# Patient Record
Sex: Male | Born: 1935 | Race: White | Hispanic: No | Marital: Married | Smoking: Never smoker
Health system: Southern US, Community
[De-identification: ages and names within clinical notes are randomized; demographics above are authoritative.]

## PROBLEM LIST (undated history)

## (undated) DIAGNOSIS — F0390 Unspecified dementia without behavioral disturbance: Secondary | ICD-10-CM

## (undated) DIAGNOSIS — M1990 Unspecified osteoarthritis, unspecified site: Secondary | ICD-10-CM

## (undated) DIAGNOSIS — Z960 Presence of urogenital implants: Secondary | ICD-10-CM

## (undated) DIAGNOSIS — N183 Chronic kidney disease, stage 3 unspecified: Secondary | ICD-10-CM

## (undated) DIAGNOSIS — E119 Type 2 diabetes mellitus without complications: Secondary | ICD-10-CM

## (undated) DIAGNOSIS — I701 Atherosclerosis of renal artery: Secondary | ICD-10-CM

## (undated) DIAGNOSIS — I2510 Atherosclerotic heart disease of native coronary artery without angina pectoris: Secondary | ICD-10-CM

## (undated) DIAGNOSIS — R339 Retention of urine, unspecified: Secondary | ICD-10-CM

## (undated) DIAGNOSIS — I255 Ischemic cardiomyopathy: Secondary | ICD-10-CM

## (undated) DIAGNOSIS — Z993 Dependence on wheelchair: Secondary | ICD-10-CM

## (undated) DIAGNOSIS — I4821 Permanent atrial fibrillation: Secondary | ICD-10-CM

## (undated) DIAGNOSIS — Z951 Presence of aortocoronary bypass graft: Secondary | ICD-10-CM

## (undated) DIAGNOSIS — Z978 Presence of other specified devices: Secondary | ICD-10-CM

## (undated) DIAGNOSIS — Z950 Presence of cardiac pacemaker: Secondary | ICD-10-CM

## (undated) DIAGNOSIS — E785 Hyperlipidemia, unspecified: Secondary | ICD-10-CM

## (undated) DIAGNOSIS — I442 Atrioventricular block, complete: Secondary | ICD-10-CM

## (undated) DIAGNOSIS — I5022 Chronic systolic (congestive) heart failure: Secondary | ICD-10-CM

## (undated) DIAGNOSIS — I10 Essential (primary) hypertension: Secondary | ICD-10-CM

## (undated) DIAGNOSIS — Z741 Need for assistance with personal care: Secondary | ICD-10-CM

## (undated) HISTORY — DX: Hyperlipidemia, unspecified: E78.5

## (undated) HISTORY — PX: OTHER SURGICAL HISTORY: SHX169

## (undated) HISTORY — PX: CARDIOVASCULAR STRESS TEST: SHX262

## (undated) HISTORY — DX: Essential (primary) hypertension: I10

## (undated) HISTORY — DX: Atrioventricular block, complete: I44.2

## (undated) HISTORY — PX: CORONARY ANGIOPLASTY WITH STENT PLACEMENT: SHX49

## (undated) HISTORY — DX: Atherosclerosis of renal artery: I70.1

## (undated) HISTORY — DX: Atherosclerotic heart disease of native coronary artery without angina pectoris: I25.10

## (undated) HISTORY — PX: PACEMAKER INSERTION: SHX728

## (undated) HISTORY — PX: PACEMAKER GENERATOR CHANGE: SHX5998

## (undated) HISTORY — PX: CORONARY ARTERY BYPASS GRAFT: SHX141

## (undated) HISTORY — DX: Unspecified osteoarthritis, unspecified site: M19.90

---

## 1996-08-10 DIAGNOSIS — Z951 Presence of aortocoronary bypass graft: Secondary | ICD-10-CM

## 1996-08-10 HISTORY — DX: Presence of aortocoronary bypass graft: Z95.1

## 2001-02-28 ENCOUNTER — Encounter: Payer: Self-pay | Admitting: Emergency Medicine

## 2001-02-28 ENCOUNTER — Emergency Department (HOSPITAL_COMMUNITY): Admission: EM | Admit: 2001-02-28 | Discharge: 2001-02-28 | Payer: Self-pay | Admitting: Emergency Medicine

## 2001-12-09 ENCOUNTER — Emergency Department (HOSPITAL_COMMUNITY): Admission: EM | Admit: 2001-12-09 | Discharge: 2001-12-09 | Payer: Self-pay | Admitting: Emergency Medicine

## 2001-12-16 ENCOUNTER — Emergency Department (HOSPITAL_COMMUNITY): Admission: EM | Admit: 2001-12-16 | Discharge: 2001-12-16 | Payer: Self-pay | Admitting: Emergency Medicine

## 2002-03-31 ENCOUNTER — Ambulatory Visit (HOSPITAL_COMMUNITY): Admission: RE | Admit: 2002-03-31 | Discharge: 2002-03-31 | Payer: Self-pay | Admitting: Podiatry

## 2002-03-31 ENCOUNTER — Encounter: Payer: Self-pay | Admitting: Podiatry

## 2004-05-18 ENCOUNTER — Ambulatory Visit: Payer: Self-pay | Admitting: Internal Medicine

## 2004-07-01 ENCOUNTER — Ambulatory Visit: Payer: Self-pay

## 2004-07-30 ENCOUNTER — Ambulatory Visit: Payer: Self-pay | Admitting: Internal Medicine

## 2004-08-30 ENCOUNTER — Ambulatory Visit: Payer: Self-pay | Admitting: Internal Medicine

## 2004-09-09 ENCOUNTER — Ambulatory Visit (HOSPITAL_COMMUNITY): Admission: RE | Admit: 2004-09-09 | Discharge: 2004-09-09 | Payer: Self-pay | Admitting: Family Medicine

## 2004-09-26 ENCOUNTER — Emergency Department (HOSPITAL_COMMUNITY): Admission: EM | Admit: 2004-09-26 | Discharge: 2004-09-26 | Payer: Self-pay | Admitting: Emergency Medicine

## 2004-09-27 ENCOUNTER — Ambulatory Visit: Payer: Self-pay | Admitting: Internal Medicine

## 2004-10-11 ENCOUNTER — Ambulatory Visit (HOSPITAL_COMMUNITY): Admission: RE | Admit: 2004-10-11 | Discharge: 2004-10-11 | Payer: Self-pay | Admitting: Family Medicine

## 2004-10-27 ENCOUNTER — Ambulatory Visit: Payer: Self-pay | Admitting: Internal Medicine

## 2004-12-08 ENCOUNTER — Ambulatory Visit: Payer: Self-pay | Admitting: Internal Medicine

## 2005-01-11 ENCOUNTER — Ambulatory Visit: Payer: Self-pay | Admitting: Internal Medicine

## 2005-02-13 ENCOUNTER — Ambulatory Visit: Payer: Self-pay | Admitting: Internal Medicine

## 2005-03-22 ENCOUNTER — Ambulatory Visit: Payer: Self-pay | Admitting: Internal Medicine

## 2005-05-01 ENCOUNTER — Ambulatory Visit: Payer: Self-pay

## 2005-06-02 ENCOUNTER — Ambulatory Visit: Payer: Self-pay | Admitting: Internal Medicine

## 2005-07-05 ENCOUNTER — Ambulatory Visit: Payer: Self-pay | Admitting: Internal Medicine

## 2005-08-07 ENCOUNTER — Ambulatory Visit: Payer: Self-pay | Admitting: Internal Medicine

## 2005-09-08 ENCOUNTER — Ambulatory Visit: Payer: Self-pay | Admitting: Internal Medicine

## 2005-10-11 ENCOUNTER — Ambulatory Visit: Payer: Self-pay | Admitting: Internal Medicine

## 2005-11-21 ENCOUNTER — Ambulatory Visit: Payer: Self-pay | Admitting: Internal Medicine

## 2005-12-25 ENCOUNTER — Ambulatory Visit: Payer: Self-pay | Admitting: Internal Medicine

## 2006-01-30 ENCOUNTER — Ambulatory Visit: Payer: Self-pay | Admitting: Internal Medicine

## 2006-02-26 ENCOUNTER — Ambulatory Visit: Payer: Self-pay | Admitting: Internal Medicine

## 2006-03-26 ENCOUNTER — Ambulatory Visit: Payer: Self-pay | Admitting: Internal Medicine

## 2006-04-09 ENCOUNTER — Ambulatory Visit: Payer: Self-pay | Admitting: *Deleted

## 2006-04-19 ENCOUNTER — Ambulatory Visit: Payer: Self-pay

## 2006-04-19 ENCOUNTER — Encounter: Payer: Self-pay | Admitting: Cardiology

## 2006-04-23 ENCOUNTER — Ambulatory Visit: Payer: Self-pay

## 2006-05-23 ENCOUNTER — Ambulatory Visit: Payer: Self-pay | Admitting: Internal Medicine

## 2006-06-20 ENCOUNTER — Ambulatory Visit: Payer: Self-pay | Admitting: Internal Medicine

## 2006-08-15 ENCOUNTER — Ambulatory Visit: Payer: Self-pay | Admitting: Internal Medicine

## 2006-09-12 ENCOUNTER — Ambulatory Visit: Payer: Self-pay | Admitting: Internal Medicine

## 2006-10-04 ENCOUNTER — Ambulatory Visit: Payer: Self-pay | Admitting: *Deleted

## 2006-10-11 ENCOUNTER — Ambulatory Visit: Payer: Self-pay | Admitting: Internal Medicine

## 2006-10-29 ENCOUNTER — Ambulatory Visit: Payer: Self-pay

## 2006-10-29 ENCOUNTER — Encounter: Payer: Self-pay | Admitting: Cardiovascular Disease

## 2006-11-07 ENCOUNTER — Ambulatory Visit: Payer: Self-pay | Admitting: Internal Medicine

## 2006-12-05 ENCOUNTER — Ambulatory Visit: Payer: Self-pay | Admitting: Internal Medicine

## 2007-01-02 ENCOUNTER — Ambulatory Visit: Payer: Self-pay | Admitting: Internal Medicine

## 2007-01-23 ENCOUNTER — Ambulatory Visit: Payer: Self-pay | Admitting: Internal Medicine

## 2007-02-27 ENCOUNTER — Ambulatory Visit: Payer: Self-pay | Admitting: Internal Medicine

## 2007-03-27 ENCOUNTER — Ambulatory Visit: Payer: Self-pay | Admitting: Internal Medicine

## 2007-04-24 ENCOUNTER — Ambulatory Visit: Payer: Self-pay | Admitting: Internal Medicine

## 2007-06-19 ENCOUNTER — Ambulatory Visit: Payer: Self-pay | Admitting: Internal Medicine

## 2007-07-17 ENCOUNTER — Ambulatory Visit: Payer: Self-pay | Admitting: Internal Medicine

## 2007-10-16 ENCOUNTER — Ambulatory Visit: Payer: Self-pay | Admitting: Internal Medicine

## 2007-10-24 ENCOUNTER — Ambulatory Visit: Payer: Self-pay | Admitting: Cardiology

## 2007-10-24 ENCOUNTER — Encounter: Payer: Self-pay | Admitting: Internal Medicine

## 2007-10-24 ENCOUNTER — Ambulatory Visit: Payer: Self-pay

## 2007-10-24 LAB — CONVERTED CEMR LAB
BUN: 18 mg/dL (ref 6–23)
Calcium: 9 mg/dL (ref 8.4–10.5)
Chloride: 102 meq/L (ref 96–112)
Creatinine, Ser: 1.2 mg/dL (ref 0.4–1.5)
Eosinophils Absolute: 0.2 10*3/uL (ref 0.0–0.7)
GFR calc Af Amer: 76 mL/min
GFR calc non Af Amer: 62 mL/min
Glucose, Bld: 162 mg/dL — ABNORMAL HIGH (ref 70–99)
HCT: 40.7 % (ref 39.0–52.0)
Hemoglobin: 13.9 g/dL (ref 13.0–17.0)
INR: 1 (ref 0.8–1.0)
MCV: 88.7 fL (ref 78.0–100.0)
Neutrophils Relative %: 75.2 % (ref 43.0–77.0)
Platelets: 154 10*3/uL (ref 150–400)
Potassium: 4.2 meq/L (ref 3.5–5.1)
Prothrombin Time: 11.7 s (ref 10.9–13.3)
WBC: 6.1 10*3/uL (ref 4.5–10.5)

## 2007-10-30 ENCOUNTER — Inpatient Hospital Stay (HOSPITAL_COMMUNITY): Admission: RE | Admit: 2007-10-30 | Discharge: 2007-10-30 | Payer: Self-pay | Admitting: Internal Medicine

## 2007-10-30 ENCOUNTER — Ambulatory Visit: Payer: Self-pay | Admitting: Internal Medicine

## 2007-11-14 ENCOUNTER — Ambulatory Visit: Payer: Self-pay

## 2008-03-24 ENCOUNTER — Ambulatory Visit: Payer: Self-pay | Admitting: Internal Medicine

## 2008-07-25 DIAGNOSIS — I701 Atherosclerosis of renal artery: Secondary | ICD-10-CM

## 2008-07-25 DIAGNOSIS — E119 Type 2 diabetes mellitus without complications: Secondary | ICD-10-CM

## 2008-07-25 DIAGNOSIS — E785 Hyperlipidemia, unspecified: Secondary | ICD-10-CM

## 2008-07-25 DIAGNOSIS — E1169 Type 2 diabetes mellitus with other specified complication: Secondary | ICD-10-CM

## 2008-07-25 DIAGNOSIS — I10 Essential (primary) hypertension: Secondary | ICD-10-CM

## 2008-07-25 DIAGNOSIS — I255 Ischemic cardiomyopathy: Secondary | ICD-10-CM | POA: Insufficient documentation

## 2008-10-20 ENCOUNTER — Emergency Department (HOSPITAL_COMMUNITY): Admission: EM | Admit: 2008-10-20 | Discharge: 2008-10-20 | Payer: Self-pay | Admitting: Emergency Medicine

## 2008-10-22 ENCOUNTER — Encounter: Payer: Self-pay | Admitting: Internal Medicine

## 2008-10-27 ENCOUNTER — Encounter: Payer: Self-pay | Admitting: Internal Medicine

## 2008-10-27 ENCOUNTER — Ambulatory Visit: Payer: Self-pay | Admitting: Internal Medicine

## 2008-10-27 DIAGNOSIS — R1013 Epigastric pain: Secondary | ICD-10-CM | POA: Insufficient documentation

## 2008-10-27 DIAGNOSIS — R55 Syncope and collapse: Secondary | ICD-10-CM

## 2009-01-21 ENCOUNTER — Encounter: Payer: Self-pay | Admitting: Internal Medicine

## 2009-01-21 ENCOUNTER — Ambulatory Visit: Payer: Self-pay | Admitting: Internal Medicine

## 2009-04-12 ENCOUNTER — Ambulatory Visit: Payer: Self-pay

## 2009-04-12 ENCOUNTER — Encounter: Payer: Self-pay | Admitting: Internal Medicine

## 2009-07-13 ENCOUNTER — Ambulatory Visit: Payer: Self-pay | Admitting: Internal Medicine

## 2009-11-02 ENCOUNTER — Ambulatory Visit: Payer: Self-pay | Admitting: Internal Medicine

## 2009-11-02 DIAGNOSIS — I442 Atrioventricular block, complete: Secondary | ICD-10-CM

## 2010-01-11 ENCOUNTER — Ambulatory Visit: Payer: Self-pay | Admitting: Internal Medicine

## 2010-01-11 ENCOUNTER — Encounter: Payer: Self-pay | Admitting: Internal Medicine

## 2010-04-20 ENCOUNTER — Ambulatory Visit: Payer: Self-pay

## 2010-05-03 ENCOUNTER — Encounter (INDEPENDENT_AMBULATORY_CARE_PROVIDER_SITE_OTHER): Payer: Self-pay | Admitting: *Deleted

## 2010-05-03 ENCOUNTER — Ambulatory Visit: Payer: Self-pay | Admitting: Internal Medicine

## 2010-05-03 DIAGNOSIS — I4821 Permanent atrial fibrillation: Secondary | ICD-10-CM

## 2010-05-03 LAB — CONVERTED CEMR LAB
Chloride: 104 meq/L (ref 96–112)
Creatinine, Ser: 1.5 mg/dL (ref 0.4–1.5)

## 2010-05-11 ENCOUNTER — Encounter: Payer: Self-pay | Admitting: Internal Medicine

## 2010-05-30 ENCOUNTER — Ambulatory Visit: Payer: Self-pay | Admitting: Internal Medicine

## 2010-05-31 LAB — CONVERTED CEMR LAB
Calcium: 9.2 mg/dL (ref 8.4–10.5)
Chloride: 103 meq/L (ref 96–112)
Glucose, Bld: 121 mg/dL — ABNORMAL HIGH (ref 70–99)
HCT: 37 % — ABNORMAL LOW (ref 39.0–52.0)
Hemoglobin: 12.8 g/dL — ABNORMAL LOW (ref 13.0–17.0)
Lymphocytes Relative: 18.6 % (ref 12.0–46.0)
Lymphs Abs: 1.2 10*3/uL (ref 0.7–4.0)
MCHC: 34.6 g/dL (ref 30.0–36.0)
Neutro Abs: 4.8 10*3/uL (ref 1.4–7.7)
Potassium: 4.8 meq/L (ref 3.5–5.1)
RBC: 4.17 M/uL — ABNORMAL LOW (ref 4.22–5.81)
RDW: 13.4 % (ref 11.5–14.6)
WBC: 6.5 10*3/uL (ref 4.5–10.5)

## 2010-06-03 ENCOUNTER — Ambulatory Visit (HOSPITAL_COMMUNITY)
Admission: RE | Admit: 2010-06-03 | Discharge: 2010-06-03 | Payer: Self-pay | Source: Home / Self Care | Admitting: Internal Medicine

## 2010-06-23 ENCOUNTER — Telehealth: Payer: Self-pay | Admitting: Internal Medicine

## 2010-06-28 ENCOUNTER — Telehealth: Payer: Self-pay | Admitting: Internal Medicine

## 2010-07-27 ENCOUNTER — Encounter: Payer: Self-pay | Admitting: Internal Medicine

## 2010-07-27 ENCOUNTER — Ambulatory Visit
Admission: RE | Admit: 2010-07-27 | Discharge: 2010-07-27 | Payer: Self-pay | Source: Home / Self Care | Attending: Internal Medicine | Admitting: Internal Medicine

## 2010-07-31 ENCOUNTER — Encounter: Payer: Self-pay | Admitting: Family Medicine

## 2010-08-03 ENCOUNTER — Telehealth: Payer: Self-pay | Admitting: Internal Medicine

## 2010-08-04 ENCOUNTER — Telehealth: Payer: Self-pay | Admitting: Internal Medicine

## 2010-08-04 ENCOUNTER — Ambulatory Visit
Admission: RE | Admit: 2010-08-04 | Discharge: 2010-08-04 | Payer: Self-pay | Source: Home / Self Care | Attending: Internal Medicine | Admitting: Internal Medicine

## 2010-08-04 ENCOUNTER — Encounter: Payer: Self-pay | Admitting: Internal Medicine

## 2010-08-05 ENCOUNTER — Ambulatory Visit (HOSPITAL_COMMUNITY)
Admission: RE | Admit: 2010-08-05 | Discharge: 2010-08-05 | Payer: Self-pay | Source: Home / Self Care | Attending: Internal Medicine | Admitting: Internal Medicine

## 2010-08-05 LAB — BASIC METABOLIC PANEL
Calcium: 8.9 mg/dL (ref 8.4–10.5)
GFR calc Af Amer: >60 mL/min (ref >60)
GFR calc non Af Amer: >60 mL/min (ref >60)
Glucose, Bld: 116 mg/dL — ABNORMAL HIGH (ref 70–99)
Sodium: 140 mEq/L (ref 135–145)

## 2010-08-05 LAB — CBC
MCHC: 33.8 g/dL (ref 30.0–36.0)
Platelets: 139 10*3/uL — ABNORMAL LOW (ref 150–400)
RBC: 4.36 MIL/uL (ref 4.22–5.81)

## 2010-08-05 LAB — PROTIME-INR: Prothrombin Time: 20 seconds — ABNORMAL HIGH (ref 11.6–15.2)

## 2010-08-05 LAB — GLUCOSE, CAPILLARY: Glucose-Capillary: 143 mg/dL — ABNORMAL HIGH (ref 70–99)

## 2010-08-09 NOTE — Procedures (Signed)
Summary: device/saf  Medications Added FEXOFENADINE HCL 180 MG TABS (FEXOFENADINE HCL) one by mouth daily      Allergies Added:   Current Medications (verified): 1)  Metoprolol Tartrate 50 Mg Tabs (Metoprolol Tartrate) .Marland Kitchen.. 1 Tab By Mouth Two Times A Day 2)  Lipitor 40 Mg Tabs (Atorvastatin Calcium) .Marland Kitchen.. 1 Tab Once Daily 3)  Glipizide 5 Mg Tabs (Glipizide) .Marland Kitchen.. 1 Tab Two Times A Day 4)  Hydrochlorothiazide 12.5 Mg Caps (Hydrochlorothiazide) .... Take One Capsule Once Daily`am 5)  Benicar 20 Mg Tabs (Olmesartan Medoxomil) .... I Tablet Once Daily 6)  Aspirin Ec 325 Mg Tbec (Aspirin) .... Take One Tablet By [facility] Other Day 7)  Fexofenadine Hcl 180 Mg Tabs (Fexofenadine Hcl) .... One By Mouth Daily  Allergies (verified): 1)  ! Pcn  PPM Specifications Following MD:  Sherryl Manges, MD     PPM Vendor:  St Jude     PPM Model Number:  [id number]     PPM Serial Number:  [id number] DOI:  10/30/2007     PPM Implanting MD:  Sherryl Manges, MD  Lead 1    Location: RA     DOI: 08/23/1996     Model #: [id number]     Serial #: [id number]     Status: active Lead 2    Location: RV     DOI: 08/23/1996     Model #: [id number]     Serial #: [id number]     Status: active  Magnet Response Rate:  BOL 98.6 ERI  86.3  Indications:  CHB TTM's with Mednet   PPM Follow Up Remote Check?  No Battery Voltage:  2.79 V     Battery Est. Longevity:  8.50     Pacer Dependent:  Yes       PPM Device Measurements Atrium  Amplitude: 0.5 mV, Impedance: 429 ohms,  Right Ventricle  Impedance: 634 ohms, Threshold: 0.75 V at 0.5 msec  Episodes MS Episodes:  43,590     Percent Mode Switch:  79%     Coumadin:  No Atrial Pacing:  78%     Ventricular Pacing:  97%  Parameters Mode:  DDDR     Lower Rate Limit:  65     Upper Rate Limit:  120 Paced AV Delay:  180     Sensed AV Delay:  160 Rate Response Parameters:  Threshold-Auto(-0.5), Slope-10, Reaction time-fast, Recovery time-medium Tech Comments:  A-fib today with undersensing of  fib waves which would account for the 78% atrial pacing noted by the device.  The AT/AF burden graph shows 100% A-fib since 7/11.  Atrial sensitivity reprogrammed 0.36mV.  Mr. Hettich is not on coumadin.  He does TTM's with Mednet.   Altha Harm, LPN  April 20, 2010 3:41 PM

## 2010-08-09 NOTE — Cardiovascular Report (Signed)
Summary: TTM   TTM   Imported By: Roderic Ovens 08/04/2009 10:45:26  _____________________________________________________________________  External Attachment:    Type:   Image     Comment:   External Document

## 2010-08-09 NOTE — Cardiovascular Report (Signed)
Summary: TTM   TTM   Imported By: Roderic Ovens 01/27/2010 16:11:18  _____________________________________________________________________  External Attachment:    Type:   Image     Comment:   External Document

## 2010-08-09 NOTE — Cardiovascular Report (Signed)
Summary: Office Visit   Office Visit   Imported By: Roderic Ovens 11/02/2009 16:09:44  _____________________________________________________________________  External Attachment:    Type:   Image     Comment:   External Document

## 2010-08-09 NOTE — Assessment & Plan Note (Signed)
Summary: pc2  Medications Added BENICAR 20 MG TABS (OLMESARTAN MEDOXOMIL) i tablet once daily ASPIRIN EC 325 MG TBEC (ASPIRIN) Take one tablet by mouthevery other day        CC:  device check.  .  History of Present Illness: Neil [name] is seen in followup for ischemic heart disease prior bypass surgery and is status post pacemaker implantation for complete heart block.  He's had no intercurrent problems with chest pain or shortness of breath. His energy level remains good.  he denies peripheral edema    Allergies: 1)  ! Pcn  Past History:  Past Medical History: Last updated: 07/25/2008 syncope Complete heart block Pacemaker implantation St. Jude Coronary artery disease with prior bypass surgery and stenting Hypertension Dyslipidemia Diabetes renal artery stenosis  95% left; 50% right osteoarthritis  Past Surgical History: Last updated: 07/25/2008 [id number]  St. Jude pacemaker  Family History: Last updated: 07/25/2008 unknown  Social History: Last updated: 07/25/2008 Married  Tobacco Use - No.  Alcohol Use - no  Vital Signs:  Patient profile:   77 year old male Height:      69 inches Pulse rhythm:   regular BP sitting:   130 / 78  (left arm) Cuff size:   large  Vitals Entered By: Judithe Modest CMA (November 02, 2009 9:14 AM)  Physical Exam  General:  The patient was alert and oriented in no acute distress. HEENT Normal.  Neck veins were flat, carotids were brisk.  Lungs were clear.  Heart sounds were regular without murmurs or gallops.  Abdomen was soft with active bowel sounds. There is no clubbing cyanosis or edema. Skin Warm and dry    PPM Specifications Following MD:  Sherryl Manges, MD     PPM Vendor:  St Jude     PPM Model Number:  [id number]     PPM Serial Number:  [id number] DOI:  10/30/2007     PPM Implanting MD:  Sherryl Manges, MD  Lead 1    Location: RA     DOI: 08/23/1996     Model #: 1242T     Serial #: [id number]     Status: active Lead 2     Location: RV     DOI: 08/23/1996     Model #: 1246T     Serial #: [id number]     Status: active  Magnet Response Rate:  BOL 98.6 ERI  86.3  Indications:  CHB TTM's with Mednet   PPM Follow Up Remote Check?  No Battery Voltage:  2.78 V     Battery Est. Longevity:  10 years     Pacer Dependent:  Yes       PPM Device Measurements Atrium  Amplitude: 1.2 mV, Impedance: 428 ohms, Threshold: 0.75 V at 0.5 msec Right Ventricle  Impedance: 612 ohms, Threshold: 0.875 V at 0.5 msec  Episodes MS Episodes:  32     Percent Mode Switch:  <1%     Coumadin:  No Atrial Pacing:  85%     Ventricular Pacing:  97%  Parameters Mode:  DDDR     Lower Rate Limit:  65     Upper Rate Limit:  120 Paced AV Delay:  180     Sensed AV Delay:  160 Rate Response Parameters:  Threshold-Auto(-0.5), Slope-10, Reaction time-fast, Recovery time-medium Next Cardiology Appt Due:  04/09/2010 Tech Comments:  No parameter changes.  Device function normal.  TTM's with Mednet.  ROV 6 months clinic.  Checked by [employer].  Altha Harm, LPN  November 02, 2009 9:44 AM   Impression & Recommendations:  Problem # 1:  SYNCOPE (ICD-780.2)   no recurrence His updated medication list for this problem includes:    Metoprolol Tartrate 50 Mg Tabs (Metoprolol tartrate) .Marland Kitchen... 1 tab by mouth two times a day  His updated medication list for this problem includes:    Metoprolol Tartrate 50 Mg Tabs (Metoprolol tartrate) .Marland Kitchen... 1 tab by mouth two times a day    Aspirin Ec 325 Mg Tbec (Aspirin) .Marland Kitchen... Take one tablet by mouthevery other day  Problem # 2:  CAD, AUTOLOGOUS BYPASS GRAFT (ICD-414.02) Tpatient has had no recurrent chest pain. His bypass grafts are from 1998. He is encouraged to let us know if there are any symptoms. I have chosen not to undertake Myoview scanning even at this late date post bypass as he is totally without symptoms.  He had not been taking his aspirin. Will go back on his aspirin every other day. His updated  medication list for this problem includes:    Metoprolol Tartrate 50 Mg Tabs (Metoprolol tartrate) .Marland Kitchen... 1 tab by mouth two times a day    Aspirin Ec 325 Mg Tbec (Aspirin) .Marland Kitchen... Take one tablet by mouthevery other day  Problem # 3:  AV BLOCK, COMPLETE (ICD-426.0) Stable post pacemaker His updated medication list for this problem includes:    Metoprolol Tartrate 50 Mg Tabs (Metoprolol tartrate) .Marland Kitchen... 1 tab by mouth two times a day    Aspirin Ec 325 Mg Tbec (Aspirin) .Marland Kitchen... Take one tablet by mouthevery other day  Problem # 4:  PACEMAKER, PERMANENT: ST JUDE ZEPHYR (ICD-V45.01) Device parameters and data were reviewed and no changes were made  Patient Instructions: 1)  Your physician wants you to follow-up in:  6months in the device clinic. You will receive a reminder letter in the mail two months in advance. If you don't receive a letter, please call our office to schedule the follow-up appointment. Prescriptions: HYDROCHLOROTHIAZIDE 12.5 MG CAPS (HYDROCHLOROTHIAZIDE) take one capsule once daily`am  #90 x 3   Entered by:   Judithe Modest CMA   Authorized by:   Nathen May, MD, [facility]   Signed by:   Judithe Modest CMA on 11/02/2009   Method used:   Electronically to        [facility]* (retail)       [address]       [place], [place]  [address]       Ph: [phone] or [phone]       Fax: [phone]   RxID:   [id number] BENICAR 20 MG TABS (OLMESARTAN MEDOXOMIL) i tablet once daily  #90 x 3   Entered by:   Judithe Modest CMA   Authorized by:   Nathen May, MD, [facility]   Signed by:   Judithe Modest CMA on 11/02/2009   Method used:   Electronically to        [facility] (retail)       [address]       [place], [place]  [address]       Ph: [phone] or [phone]       Fax: [phone]   RxID:   [id number]

## 2010-08-09 NOTE — Letter (Signed)
Summary: Cardioversion/TEE Instructions  Architectural technologist, Main Office  [address]   [place], [place] [address]   Phone: [phone]  Fax: [phone]    Cardioversion / TEE Cardioversion Instructions  05/03/2010 MRN: [id number]  NIKALAS BRAMEL [address] [place], [place]  [address]  Dear Mr. Pooley,  You are scheduled for a Cardioversion November 25, 2011at 9:00 am with Dr. Graciela Husbands.   Marland Kitchen  Please arrive at the [facility] at 7:00 a.m.  on the day of your procedure.  1)   DIET:  A)   Nothing to eat or drink after midnight except your medications with a sip of water. Please hold Lipizide and Hydrochorothiazide the morning of the procedure.   2)   Come to the [place] office on May 30, 2010 at 11:00 am for lab work. The lab at [facility] is open from 8:30 a.m. to 1:30 p.m. and 2:30 p.m. to 5:00 p.m. The lab at [facility] is open from 7:30 a.m. to 5:30 p.m. You do not have to be fasting.   3)  Must have a responsible person to drive you home.  4)   Bring a current list of your medications and current insurance cards.   * Special Note:  Every effort is made to have your procedure done on time. Occasionally there are emergencies that present themselves at the hospital that may cause delays. Please be patient if a delay does occur.  * If you have any questions after you get home, please call the office at [phone]. Claris Gladden, RN

## 2010-08-09 NOTE — Assessment & Plan Note (Signed)
Summary: NEEDS FU W/Davionna Blacksher PER PAULA/MT  Medications Added PRADAXA 150 MG CAPS (DABIGATRAN ETEXILATE MESYLATE) two times a day      Allergies Added:   Visit Type:  PPM-St.Jude check Primary Provider:  Dr. Sherwood Gambler  CC:  dizziness.  History of Present Illness: Neil [name] is seen in followup for ischemic heart disease prior bypass surgery and is status post pacemaker implantation for complete heart block.  He's had no intercurrent problems with chest pain or shortness of breath. His energy level remains good.  He has had however with dizziness. This goes back about 6 months. It is largely orthostatic in nature. His diabetes is under relatively good control. Has been no significant peripheral edema.    Problems Prior to Update: 1)  Atrial Fibrillation  (ICD-427.31) 2)  Av Block, Complete  (ICD-426.0) 3)  Syncope  (ICD-780.2) 4)  Pacemaker, Permanent: St Jude Zephyr  (ICD-V45.01) 5)  Cad, Autologous Bypass Graft  (ICD-414.02) 6)  Abdominal Pain-epigastric  (ICD-789.06) 7)  Dyslipidemia  (ICD-272.4) 8)  Atherosclerosis of Renal Artery  (ICD-440.1) 9)  Aodm  (ICD-250.00) 10)  Hypertension, Benign  (ICD-401.1)  Current Medications (verified): 1)  Metoprolol Tartrate 50 Mg Tabs (Metoprolol Tartrate) .Marland Kitchen.. 1 Tab By Mouth Two Times A Day 2)  Lipitor 40 Mg Tabs (Atorvastatin Calcium) .Marland Kitchen.. 1 Tab Once Daily 3)  Glipizide 5 Mg Tabs (Glipizide) .Marland Kitchen.. 1 Tab Two Times A Day 4)  Hydrochlorothiazide 12.5 Mg Caps (Hydrochlorothiazide) .... Take One Capsule Once Daily`am 5)  Benicar 20 Mg Tabs (Olmesartan Medoxomil) .... I Tablet Once Daily 6)  Aspirin Ec 325 Mg Tbec (Aspirin) .... Take One Tablet By [facility] Other Day  Allergies (verified): 1)  ! Pcn  Past History:  Past Medical History: Last updated: 07/25/2008 syncope Complete heart block Pacemaker implantation St. Jude Coronary artery disease with prior bypass surgery and stenting Hypertension Dyslipidemia Diabetes renal artery  stenosis  95% left; 50% right osteoarthritis  Past Surgical History: Last updated: 07/25/2008 [id number]  St. Jude pacemaker  Family History: Last updated: 07/25/2008 unknown  Social History: Last updated: 07/25/2008 Married  Tobacco Use - No.  Alcohol Use - no  Risk Factors: Smoking Status: never (07/25/2008)  Vital Signs:  Patient profile:   77 year old male Height:      69 inches Weight:      190.75 pounds BMI:     28.27 Pulse rate:   75 / minute BP sitting:   117 / 74  (left arm) Cuff size:   regular  Vitals Entered By: Caralee Ates CMA (May 03, 2010 10:39 AM)  Physical Exam  General:  The patient was alert and oriented in no acute distress. HEENT Normal.  Neck veins were flat, carotids were brisk.  Lungs were clear.  Heart sounds were regular without murmurs or gallops.  Abdomen was soft with active bowel sounds. There is no clubbing cyanosis or edema. Skin Warm and dry    EKG  Procedure date:  05/03/2010  Findings:      atrial fibrillation with underlying ventricular pacing Intervals-/0.16/0.43 Axis is -74 Rate 68   PPM Specifications Following MD:  Sherryl Manges, MD     PPM Vendor:  St Jude     PPM Model Number:  [id number]     PPM Serial Number:  [id number] DOI:  10/30/2007     PPM Implanting MD:  Sherryl Manges, MD  Lead 1    Location: RA     DOI: 08/23/1996     Model #:  [id number]     Serial #: [id number]     Status: active Lead 2    Location: RV     DOI: 08/23/1996     Model #: [id number]     Serial #: [id number]     Status: active  Magnet Response Rate:  BOL 98.6 ERI  86.3  Indications:  CHB TTM's with Mednet   PPM Follow Up Remote Check?  No Battery Voltage:  2.80 V     Battery Est. Longevity:  9 years     Pacer Dependent:  Yes       PPM Device Measurements Atrium  Impedance: 454 ohms,  Right Ventricle  Impedance: 640 ohms,   Episodes Percent Mode Switch:  100%     Coumadin:  No  Parameters Mode:  DDDR     Lower Rate Limit:  65     Upper Rate  Limit:  120 Paced AV Delay:  180     Sensed AV Delay:  160 Rate Response Parameters:  Threshold-Auto(-0.5), Slope-10, Reaction time-fast, Recovery time-medium Tech Comments:  New A-fib noted during clinic visit 04/20/10, - coumadin. Altha Harm, LPN  May 03, 2010 10:39 AM    Impression & Recommendations:  Problem # 1:  ATRIAL FIBRILLATION (ICD-427.31)  this is newly identified about 6 months ago .correlating in time with his dizziness.  There is an orthostatic component which may relate to his dm and htn vascular disease\  His calves at score is5(hypertension-1, age-6, diabetes-1, vascular disease-1);  we have discussed the appropriate role of oral anticoagulation therapy with potential downside of hemorrhage. We discussed Pradaxa and Coumadin;  we will check his renal profile to make sure his creatinine clearance is adequate as we have assumed and then begin him on Pradaxa. We'll then plan to undertake cardioversion in 4 weeks time. The following medications were removed from the medication list:    Aspirin Ec 325 Mg Tbec (Aspirin) .Marland Kitchen... Take one tablet by mouthevery other day His updated medication list for this problem includes:    Metoprolol Tartrate 50 Mg Tabs (Metoprolol tartrate) .Marland Kitchen... 1 tab by mouth two times a day  Orders: TLB-BMP (Basic Metabolic Panel-BMET) (80048-METABOL)  Problem # 2:  SYNCOPE (ICD-780.2)  without recurrence; orthostatic intolerance as discussed above;  Will also plan to hold his hydrochlorothiazide The following medications were removed from the medication list:    Aspirin Ec 325 Mg Tbec (Aspirin) .Marland Kitchen... Take one tablet by mouthevery other day His updated medication list for this problem includes:    Metoprolol Tartrate 50 Mg Tabs (Metoprolol tartrate) .Marland Kitchen... 1 tab by mouth two times a day  Orders: TLB-BMP (Basic Metabolic Panel-BMET) (80048-METABOL)  Problem # 3:  PACEMAKER, PERMANENT: ST JUDE ZEPHYR (ICD-V45.01) Device parameters and data were  reviewed and no changes were made  Problem # 4:  CAD, AUTOLOGOUS BYPASS GRAFT (ICD-414.02) stable; we will discontinue his aspirin The following medications were removed from the medication list:    Aspirin Ec 325 Mg Tbec (Aspirin) .Marland Kitchen... Take one tablet by mouthevery other day His updated medication list for this problem includes:    Metoprolol Tartrate 50 Mg Tabs (Metoprolol tartrate) .Marland Kitchen... 1 tab by mouth two times a day  Patient Instructions: 1)  Your physician recommends that you have a BMP today. 2)  Your physician has recommended you make the following change in your medication: stop Aspirin, start Pradaxa 150 mg twice a day.  3)  Your physician has recommended that you have a cardioversion (DCCV).  Electrical cardioversion  uses a jolt of electricity to your heart either through paddles or wired patches attached to your chest. This is a controlled, usually prescheduled, procedure. Defibrillation is done under light anesthesia in the hospital, and you usually go home the day of the procedure. This is done to get your heart back into a normal rhythm. You are not awake for the procedure. Please see the instruction sheet given to you today.

## 2010-08-11 NOTE — Progress Notes (Signed)
Summary: refill request   Phone Note Refill Request Message from:  Pharmacy andy on June 28, 2010 10:01 AM  Refills Requested: Medication #1:  PRADAXA 150 MG CAPS two times a day. [id number]  pharmacy   Method Requested: Telephone to Pharmacy Initial call taken by: Glynda Jaeger,  June 28, 2010 10:01 AM    Prescriptions: PRADAXA 150 MG CAPS (DABIGATRAN ETEXILATE MESYLATE) two times a day  #60 x 6   Entered by:   Caralee Ates CMA   Authorized by:   Nathen May, MD, [facility]   Signed by:   Caralee Ates CMA on 06/28/2010   Method used:   Electronically to        [facility] (retail)       [address]       [place], [place]  [address]       Ph: [phone] or [phone]       Fax: [phone]   RxID:   [id number]

## 2010-08-11 NOTE — Progress Notes (Signed)
Summary: PT IS OUT OF MEDICATION   Phone Note Refill Request Call back at Home Phone [phone] Message from:  Patient on [facility] PHONE # [phone]  Refills Requested: Medication #1:  PRADAXA 150 MG CAPS two times a day. PT IS COMPLETELY OUT AND NEED THIS CALLED IN TODAY  Initial call taken by: Omer Jack,  June 23, 2010 9:34 AM Caller: Patient Initial call taken by: Omer Jack,  June 23, 2010 9:33 AM    Prescriptions: PRADAXA 150 MG CAPS (DABIGATRAN ETEXILATE MESYLATE) two times a day  #60 x 6   Entered by:   Caralee Ates CMA   Authorized by:   Nathen May, MD, [facility]   Signed by:   Caralee Ates CMA on 06/28/2010   Method used:   Electronically to        [facility] (retail)       [address]       [place], [place]  [address]       Ph: [phone] or [phone]       Fax: [phone]   RxID:   [id number]

## 2010-08-11 NOTE — Assessment & Plan Note (Signed)
Summary: eph/ gd      Allergies Added:   Visit Type:  eph Primary Provider:  Dr. Sherwood Gambler  CC:  no complaints.  History of Present Illness: Mr. Mathe is seen in followup for ischemic heart disease prior bypass surgery and is status post pacemaker implantation for complete heart block.  He's had no intercurrent problems with chest pain or shortness of breath. His energy level remains good.  He has had however with dizziness. This goes back about 6 months. It is largely orthostatic in nature. His diabetes is under relatively good control. Has been no significant peripheral edema. largley resolove overall The patient denies SOB, chest pain, edema or palpitations    Problems Prior to Update: 1)  Essential Hypertension, Benign  (ICD-401.1) 2)  Atrial Fibrillation  (ICD-427.31) 3)  Av Block, Complete  (ICD-426.0) 4)  Syncope  (ICD-780.2) 5)  Pacemaker, Permanent: St Jude Zephyr  (ICD-V45.01) 6)  Cad, Autologous Bypass Graft  (ICD-414.02) 7)  Abdominal Pain-epigastric  (ICD-789.06) 8)  Dyslipidemia  (ICD-272.4) 9)  Atherosclerosis of Renal Artery  (ICD-440.1) 10)  Aodm  (ICD-250.00) 11)  Hypertension, Benign  (ICD-401.1)  Current Medications (verified): 1)  Metoprolol Tartrate 50 Mg Tabs (Metoprolol Tartrate) .Marland Kitchen.. 1 Tab By Mouth Two Times A Day 2)  Lipitor 40 Mg Tabs (Atorvastatin Calcium) .Marland Kitchen.. 1 Tab Once Daily 3)  Glipizide 5 Mg Tabs (Glipizide) .Marland Kitchen.. 1 Tab Two Times A Day 4)  Hydrochlorothiazide 12.5 Mg Caps (Hydrochlorothiazide) .... Take One Capsule Once Daily`am 5)  Benicar 20 Mg Tabs (Olmesartan Medoxomil) .... I Tablet Once Daily 6)  Pradaxa 150 Mg Caps (Dabigatran Etexilate Mesylate) .... Two Times A Day  Allergies (verified): 1)  ! Pcn  Past History:  Past Medical History: Last updated: 07/25/2008 syncope Complete heart block Pacemaker implantation St. Jude Coronary artery disease with prior bypass surgery and stenting Hypertension Dyslipidemia Diabetes renal  artery stenosis  95% left; 50% right osteoarthritis  Past Surgical History: Last updated: 07/25/2008 [id number]  St. Jude pacemaker  Family History: Last updated: 07/25/2008 unknown  Social History: Last updated: 07/25/2008 Married  Tobacco Use - No.  Alcohol Use - no  Risk Factors: Smoking Status: never (07/25/2008)  Vital Signs:  Patient profile:   77 year old male Height:      69 inches Weight:      192.75 pounds BMI:     28.57 Pulse rate:   70 / minute BP sitting:   130 / 78  (left arm) Cuff size:   regular  Vitals Entered By: Caralee Ates CMA (July 27, 2010 2:15 PM)  Physical Exam  General:  The patient was alert and oriented in no acute distress. HEENT Normal.  Neck veins were 8-9, carotids were brisk.  Lungs were clear.  Heart sounds were regular without murmurs or gallops.  Abdomen was soft with active bowel sounds. There is no clubbing cyanosis  1+edema. Skin Warm and dry    PPM Specifications Following MD:  Sherryl Manges, MD     PPM Vendor:  St Jude     PPM Model Number:  [id number]     PPM Serial Number:  [id number] DOI:  10/30/2007     PPM Implanting MD:  Sherryl Manges, MD  Lead 1    Location: RA     DOI: 08/23/1996     Model #: [id number]     Serial #: [id number]     Status: active Lead 2    Location: RV     DOI: 08/23/1996  Model #: [id number]     Serial #: [id number]     Status: active  Magnet Response Rate:  BOL 98.6 ERI  86.3  Indications:  CHB TTM's with Mednet   PPM Follow Up Remote Check?  No Battery Voltage:  2.76 V     Battery Est. Longevity:  7.5 years     Pacer Dependent:  Yes       PPM Device Measurements Atrium  Amplitude: 0.4 mV, Impedance: 408 ohms,  Right Ventricle  Impedance: 598 ohms, Threshold: 0.75 V at 0.5 msec  Episodes MS Episodes:  43     Percent Mode Switch:  2.3%     Coumadin:  No Atrial Pacing:  83%     Ventricular Pacing:  91%  Parameters Mode:  DDDR     Lower Rate Limit:  65     Upper Rate Limit:  120 Paced AV Delay:  180      Sensed AV Delay:  160 Rate Response Parameters:  Threshold-Auto(-0.5), Slope-10, Reaction time-fast, Recovery time-medium Next Cardiology Appt Due:  01/08/2011 Tech Comments:  No parameter changes.  Device function normal.  A-fib last episode 1/17 ongoing.  TTM's with Mednet.  ROV 6 months clinic. Altha Harm, LPN  July 27, 2010 3:06 PM   Impression & Recommendations:  Problem # 1:  ATRIAL FIBRILLATION (ICD-427.31) the patient has refverted ot AF yesterday.  he is unaware.  we will keep track of symptoms and cafrdiovert if we see deterioration hes on pradaxa His updated medication list for this problem includes:    Metoprolol Tartrate 50 Mg Tabs (Metoprolol tartrate) .Marland Kitchen... 1 tab by mouth two times a day  Problem # 2:  AV BLOCK, COMPLETE (ICD-426.0)  stabel  His updated medication list for this problem includes:    Metoprolol Tartrate 50 Mg Tabs (Metoprolol tartrate) .Marland Kitchen... 1 tab by mouth two times a day  Problem # 3:  PACEMAKER, PERMANENT: ST JUDE ZEPHYR (ICD-V45.01) Device parameters and data were reviewed and no changes were made  Problem # 4:  CAD, AUTOLOGOUS BYPASS GRAFT (ICD-414.02) stable His updated medication list for this problem includes:    Metoprolol Tartrate 50 Mg Tabs (Metoprolol tartrate) .Marland Kitchen... 1 tab by mouth two times a day  Patient Instructions: 1)  Your physician wants you to follow-up in:6 months  You will receive a reminder letter in the mail two months in advance. If you don't receive a letter, please call our office to schedule the follow-up appointment. 2)  Your physician recommends that you continue on your current medications as directed. Please refer to the Current Medication list given to you today. 3)  Call us next week to let us know how you are feeling and if want to have Cardioversion.

## 2010-08-11 NOTE — Cardiovascular Report (Signed)
Summary: Office Visit   Office Visit   Imported By: Roderic Ovens 08/01/2010 11:10:13  _____________________________________________________________________  External Attachment:    Type:   Image     Comment:   External Document

## 2010-08-11 NOTE — Procedures (Signed)
Summary: PPM-SJ check,pt in AFIB      Allergies Added:   Current Medications (verified): 1)  Metoprolol Tartrate 50 Mg Tabs (Metoprolol Tartrate) .Marland Kitchen.. 1 Tab By Mouth Two Times A Day 2)  Lipitor 40 Mg Tabs (Atorvastatin Calcium) .Marland Kitchen.. 1 Tab Once Daily 3)  Glipizide 5 Mg Tabs (Glipizide) .Marland Kitchen.. 1 Tab Two Times A Day 4)  Hydrochlorothiazide 12.5 Mg Caps (Hydrochlorothiazide) .... Take One Capsule Once Daily`am 5)  Benicar 20 Mg Tabs (Olmesartan Medoxomil) .... I Tablet Once Daily 6)  Pradaxa 150 Mg Caps (Dabigatran Etexilate Mesylate) .... Two Times A Day  Allergies (verified): 1)  ! Pcn  PPM Specifications Following MD:  Sherryl Manges, MD     PPM Vendor:  St Jude     PPM Model Number:  [id number]     PPM Serial Number:  [id number] PPM DOI:  10/30/2007     PPM Implanting MD:  Sherryl Manges, MD  Lead 1    Location: RA     DOI: 08/23/1996     Model #: 1242T     Serial #: [id number]     Status: active Lead 2    Location: RV     DOI: 08/23/1996     Model #: 1246T     Serial #: [id number]     Status: active  Magnet Response Rate:  BOL 98.6 ERI  86.3  Indications:  CHB TTM's with Mednet   PPM Follow Up Remote Check?  No Battery Voltage:  2.76 V     Battery Est. Longevity:  8.5 years     Pacer Dependent:  Yes       PPM Device Measurements Atrium  Impedance: 363 ohms,  Right Ventricle  Impedance: 574 ohms,   Episodes MS Episodes:  1     Percent Mode Switch:  100%     Coumadin:  No  Parameters Mode:  DDDR     Lower Rate Limit:  65     Upper Rate Limit:  120 Paced AV Delay:  180     Sensed AV Delay:  160 Rate Response Parameters:  Threshold-Auto(-0.5), Slope-10, Reaction time-fast, Recovery time-medium Tech Comments:  Mr. Bidinger was seen today for worsening SOB on exertion and fatigue.  He was seen last 07/27/10 and on interrogration then he was found to have mode switched 18/17 and at that time was unaware of the A-fib.  He has been in A-fib since that time.  His ventricular rates are controlled and he  is on pradaxa.  Dr. Graciela Husbands discussed cardioversion with him on the 18th.  At that time the patient wanted to wait.  The plan is now to go ahead with the cardioversion.  I will have Bjorn Loser set this up for him and call the patient with the details.   Altha Harm, LPN  August 04, 2010 11:38 AM

## 2010-08-11 NOTE — Progress Notes (Signed)
Summary:  heart beat   Phone Note Call from Patient Call back at Home Phone [phone]   Caller: Spouse Reason for Call: Talk to Nurse Summary of Call: Pt states heart beat is out of rhythm.  Initial call taken by: Roe Coombs,  August 03, 2010 3:08 PM  Follow-up for Phone Call        Mrs. Cocuzza states that husband is getting dizzy when bending over and shob when outside-not as bad as before though. See that Dr. Graciela Husbands wants to do a cardioversion when pt become symptomatic. She is cautious at having that done and would like to have pacemaker interrogated first and then decide. They will come in tomorrow at 1130.  Follow-up by: Claris Gladden RN,  August 03, 2010 3:42 PM

## 2010-08-11 NOTE — Progress Notes (Signed)
   Phone Note Outgoing Call   Call placed by: rhonda Call placed to: Patient Details for Reason: cardioversion date Summary of Call: lmfcb, able to get time for procedure tomorrow.  Initial call taken by: Claris Gladden RN,  August 04, 2010 3:34 PM  Follow-up for Phone Call        spoke w/Neil [name] and reviewed procedure and meds to hold. She expressed understanding.  Follow-up by: Claris Gladden RN,  August 04, 2010 5:17 PM

## 2010-08-17 ENCOUNTER — Encounter (INDEPENDENT_AMBULATORY_CARE_PROVIDER_SITE_OTHER): Payer: Medicare Other | Admitting: Internal Medicine

## 2010-08-17 ENCOUNTER — Encounter: Payer: Self-pay | Admitting: Internal Medicine

## 2010-08-17 DIAGNOSIS — I4891 Unspecified atrial fibrillation: Secondary | ICD-10-CM

## 2010-08-17 DIAGNOSIS — I5022 Chronic systolic (congestive) heart failure: Secondary | ICD-10-CM

## 2010-08-17 DIAGNOSIS — R42 Dizziness and giddiness: Secondary | ICD-10-CM

## 2010-08-25 NOTE — Cardiovascular Report (Signed)
Summary: Office Visit   Office Visit   Imported By: Roderic Ovens 08/16/2010 15:56:47  _____________________________________________________________________  External Attachment:    Type:   Image     Comment:   External Document

## 2010-08-25 NOTE — Assessment & Plan Note (Signed)
Summary: pacer/physician ck after 2p echo per dr Joandy Burget/mt  Medications Added LEVOTHYROXINE SODIUM 50 MCG TABS (LEVOTHYROXINE SODIUM) take one tablet once daily      Allergies Added:   Primary Provider:  Dr. Sherwood Gambler  CC:  follow up to cardioversion/ Pradaxa is very expensive now.  .  History of Present Illness: [name] is seen in followup for ischemic heart disease prior bypass surgery and is status post pacemaker implantation for complete heart block.  He recently underwent cardioversion for fatigue and dizziness which were concurrent with atrial fibrillation as identified by his device. He is feeling considerably better since then with more energy and no further lightheadedness. I should note that a couple days after the cardioversion he had transient similar symptoms which then resolved suggesting the possibility of a paroxysm of atrial fibrillation that spontaneously terminated    overall The patient denies  chest pain, edema or palpitations    Current Medications (verified): 1)  Metoprolol Tartrate 50 Mg Tabs (Metoprolol Tartrate) .Marland Kitchen.. 1 Tab By Mouth Two Times A Day 2)  Lipitor 40 Mg Tabs (Atorvastatin Calcium) .Marland Kitchen.. 1 Tab Once Daily 3)  Glipizide 5 Mg Tabs (Glipizide) .Marland Kitchen.. 1 Tab Two Times A Day 4)  Hydrochlorothiazide 12.5 Mg Caps (Hydrochlorothiazide) .... Take One Capsule Once Daily`am 5)  Benicar 20 Mg Tabs (Olmesartan Medoxomil) .... I Tablet Once Daily 6)  Pradaxa 150 Mg Caps (Dabigatran Etexilate Mesylate) .... Two Times A Day 7)  Levothyroxine Sodium 50 Mcg Tabs (Levothyroxine Sodium) .... Take One Tablet Once Daily  Allergies (verified): 1)  ! Pcn  Past History:  Past Medical History: Last updated: 07/25/2008 syncope Complete heart block Pacemaker implantation St. Jude Coronary artery disease with prior bypass surgery and stenting Hypertension Dyslipidemia Diabetes renal artery stenosis  95% left; 50% right osteoarthritis  Past Surgical History: Last  updated: 07/25/2008 [id number]  St. Jude pacemaker  Family History: Last updated: 07/25/2008 unknown  Social History: Last updated: 07/25/2008 Married  Tobacco Use - No.  Alcohol Use - no  Vital Signs:  Patient profile:   77 year old male Height:      69 inches Weight:      184 pounds BMI:     27.27 Pulse rate:   79 / minute Pulse rhythm:   regular BP sitting:   118 / 58  (left arm)  Vitals Entered By: Judithe Modest CMA (August 17, 2010 3:54 PM)  Physical Exam  General:  The patient was alert and oriented in no acute distress. HEENT Normal.  Neck veins were flat, carotids were brisk.  Lungs were clear.  Heart sounds were regular without murmurs or gallops.  Abdomen was soft with active bowel sounds. There is no clubbing cyanosis or edema. Skin Warm and dry    EKG  Procedure date:  08/17/2010  Findings:      sinus rhythm at 70 9X and 12.18/0.15/0.44 P. synchronous pacing Axis is leftward at -55  PPM Specifications Following MD:  Sherryl Manges, MD     PPM Vendor:  St Jude     PPM Model Number:  [id number]     PPM Serial Number:  [id number] PPM DOI:  10/30/2007     PPM Implanting MD:  Sherryl Manges, MD  Lead 1    Location: RA     DOI: 08/23/1996     Model #: 1242T     Serial #: [id number]     Status: active Lead 2    Location: RV  DOI: 08/23/1996     Model #: 1246T     Serial #: [id number]     Status: active  Magnet Response Rate:  BOL 98.6 ERI  86.3  Indications:  CHB TTM's with Mednet   PPM Follow Up Pacer Dependent:  Yes      Episodes Coumadin:  No  Parameters Mode:  DDDR     Lower Rate Limit:  65     Upper Rate Limit:  120 Paced AV Delay:  180     Sensed AV Delay:  160 Rate Response Parameters:  Threshold-Auto(-0.5), Slope-10, Reaction time-fast, Recovery time-medium  Impression & Recommendations:  Problem # 1:  ATRIAL FIBRILLATION (ICD-427.31) atrial fibrillation is currently quiet. His symptoms of lightheadedness and weakness are much improved. Given  the transient episode that he had earlier this month we will the question is whether he needs intrinsic suppression. Will plan to see him again at the end of this month as was previously scheduled and we'll look at his pacemaker at that time to identify whether there've been paroxysms of atrial fibrillation that might benefit from a drug such as amiodarone which we briefly discussed today  for now is on pradaxa His updated medication list for this problem includes:    Metoprolol Tartrate 50 Mg Tabs (Metoprolol tartrate) .Marland Kitchen... 1 tab by mouth two times a day  Problem # 2:  ESSENTIAL HYPERTENSION, BENIGN (ICD-401.1) stable His updated medication list for this problem includes:    Metoprolol Tartrate 50 Mg Tabs (Metoprolol tartrate) .Marland Kitchen... 1 tab by mouth two times a day    Hydrochlorothiazide 12.5 Mg Caps (Hydrochlorothiazide) .Marland Kitchen... Take one capsule once daily`am    Benicar 20 Mg Tabs (Olmesartan medoxomil) ..... I tablet once daily  Problem # 3:  CAD, AUTOLOGOUS BYPASS GRAFT (ICD-414.02) stable on current meds; not on aspirin because of his pradaxa His updated medication list for this problem includes:    Metoprolol Tartrate 50 Mg Tabs (Metoprolol tartrate) .Marland Kitchen... 1 tab by mouth two times a day  Problem # 4:  DIZZINESS (ICD-780.4) improved  Appended Document: pacer/physician ck after 2p echo per dr Velda Wendt/mt   Patient Instructions: 1)  Your physician recommends that you keep scheduled appt for echo on August 29, 2010 at 2:00.  Your device will be checked at that time. 2)  Your physician recommends that you continue on your current medications as directed. Please refer to the Current Medication list given to you today.

## 2010-08-29 ENCOUNTER — Ambulatory Visit (HOSPITAL_COMMUNITY): Payer: Medicare Other | Attending: Family Medicine

## 2010-08-29 ENCOUNTER — Encounter: Payer: Self-pay | Admitting: Internal Medicine

## 2010-08-29 DIAGNOSIS — I441 Atrioventricular block, second degree: Secondary | ICD-10-CM

## 2010-08-29 DIAGNOSIS — Z951 Presence of aortocoronary bypass graft: Secondary | ICD-10-CM | POA: Insufficient documentation

## 2010-08-29 DIAGNOSIS — I4891 Unspecified atrial fibrillation: Secondary | ICD-10-CM | POA: Insufficient documentation

## 2010-08-29 DIAGNOSIS — R55 Syncope and collapse: Secondary | ICD-10-CM

## 2010-08-29 DIAGNOSIS — I442 Atrioventricular block, complete: Secondary | ICD-10-CM | POA: Insufficient documentation

## 2010-08-29 DIAGNOSIS — E119 Type 2 diabetes mellitus without complications: Secondary | ICD-10-CM | POA: Insufficient documentation

## 2010-08-29 DIAGNOSIS — Z950 Presence of cardiac pacemaker: Secondary | ICD-10-CM | POA: Insufficient documentation

## 2010-08-29 DIAGNOSIS — I2589 Other forms of chronic ischemic heart disease: Secondary | ICD-10-CM | POA: Insufficient documentation

## 2010-08-29 DIAGNOSIS — I079 Rheumatic tricuspid valve disease, unspecified: Secondary | ICD-10-CM | POA: Insufficient documentation

## 2010-08-29 DIAGNOSIS — E785 Hyperlipidemia, unspecified: Secondary | ICD-10-CM | POA: Insufficient documentation

## 2010-08-29 DIAGNOSIS — I701 Atherosclerosis of renal artery: Secondary | ICD-10-CM | POA: Insufficient documentation

## 2010-08-29 NOTE — Op Note (Signed)
  NAMEDONG, NIMMONS              ACCOUNT NO.:  [id number]  MEDICAL RECORD NO.:  [id number]          PATIENT TYPE:  OIB  LOCATION:  2899                         FACILITY:  [facility]  PHYSICIAN:  Duke Salvia, MD, FACCDATE OF BIRTH:  [birth year]  DATE OF PROCEDURE:  08/05/2010 DATE OF DISCHARGE:  08/05/2010                              OPERATIVE REPORT   SURGEON:  Duke Salvia, MD, [facility]  PREOPERATIVE DIAGNOSIS:  Atrial fibrillation.  POSTOPERATIVE DIAGNOSIS:  Sinus rhythm.  PROCEDURE:  DC cardioversion.  The patient was submitted to Anesthesia under the care of Dr. Michelle Piper.  He received a 100 mg of propofol.  A 100-joule shock was delivered in an AP configuration synchronized with his QRS.  Sinus rhythm was restored.  The patient tolerated the procedure without apparent complication.     Duke Salvia, MD, [facility]     SCK/MEDQ  D:  08/05/2010  T:  08/06/2010  Job:  [id number]  Electronically Signed by Sherryl Manges MD [facility] on 08/29/2010 09:57:12 PM

## 2010-09-06 NOTE — Procedures (Signed)
Summary: Cardiology Device Clinic    Allergies: 1)  ! Pcn  PPM Specifications Following MD:  Sherryl Manges, MD     PPM Vendor:  St Jude     PPM Model Number:  [id number]     PPM Serial Number:  [id number] PPM DOI:  10/30/2007     PPM Implanting MD:  Sherryl Manges, MD  Lead 1    Location: RA     DOI: 08/23/1996     Model #: 1242T     Serial #: [id number]     Status: active Lead 2    Location: RV     DOI: 08/23/1996     Model #: 1246T     Serial #: [id number]     Status: active  Magnet Response Rate:  BOL 98.6 ERI  86.3  Indications:  CHB TTM's with Mednet   PPM Follow Up Pacer Dependent:  Yes      Episodes Coumadin:  No  Parameters Mode:  DDDR     Lower Rate Limit:  65     Upper Rate Limit:  120 Paced AV Delay:  180     Sensed AV Delay:  160 Rate Response Parameters:  Threshold-Auto(-0.5), Slope-10, Reaction time-fast, Recovery time-medium Tech Comments:  See PaceArt

## 2010-09-15 NOTE — Cardiovascular Report (Signed)
Summary: Office Visit   Office Visit   Imported By: Roderic Ovens 09/08/2010 16:21:32  _____________________________________________________________________  External Attachment:    Type:   Image     Comment:   External Document

## 2010-09-20 LAB — GLUCOSE, CAPILLARY: Glucose-Capillary: 149 mg/dL — ABNORMAL HIGH (ref 70–99)

## 2010-10-25 ENCOUNTER — Encounter: Payer: Self-pay | Admitting: Internal Medicine

## 2010-10-25 DIAGNOSIS — I495 Sick sinus syndrome: Secondary | ICD-10-CM

## 2010-10-26 ENCOUNTER — Encounter (HOSPITAL_COMMUNITY): Payer: Self-pay

## 2010-10-26 ENCOUNTER — Ambulatory Visit (HOSPITAL_COMMUNITY)
Admission: RE | Admit: 2010-10-26 | Discharge: 2010-10-26 | Disposition: A | Discharge status: Home / Self Care | Payer: Medicare Other | Source: Ambulatory Visit | Attending: Family Medicine | Admitting: Family Medicine

## 2010-10-26 ENCOUNTER — Other Ambulatory Visit (HOSPITAL_COMMUNITY): Payer: Self-pay | Admitting: Family Medicine

## 2010-10-26 DIAGNOSIS — M25476 Effusion, unspecified foot: Secondary | ICD-10-CM | POA: Insufficient documentation

## 2010-10-26 DIAGNOSIS — S93409A Sprain of unspecified ligament of unspecified ankle, initial encounter: Secondary | ICD-10-CM

## 2010-10-26 DIAGNOSIS — M25579 Pain in unspecified ankle and joints of unspecified foot: Secondary | ICD-10-CM | POA: Insufficient documentation

## 2010-10-26 DIAGNOSIS — M25473 Effusion, unspecified ankle: Secondary | ICD-10-CM | POA: Insufficient documentation

## 2010-11-14 ENCOUNTER — Other Ambulatory Visit: Payer: Self-pay | Admitting: *Deleted

## 2010-11-14 MED ORDER — HYDROCHLOROTHIAZIDE 12.5 MG PO CAPS: 12.5000 mg | ORAL_CAPSULE | Freq: Every day | ORAL | Status: DC

## 2010-11-14 MED ORDER — OLMESARTAN MEDOXOMIL 20 MG PO TABS: 20.0000 mg | ORAL_TABLET | Freq: Every day | ORAL | Status: DC

## 2010-11-22 NOTE — Letter (Signed)
January 23, 2007    Patrica Duel, M.D.  [address], [address]  [place], [place] [address]   RE:  COSME, JACOB  MRN:  [id number]  /  DOB:  Jun 16, 1929   Dear Loraine Leriche:   I hope this letter finds you well. I miss the opportunity to talk to  you.   Mr. Mcmurtry is doing quite well. He has ischemic heart disease, as you  know, with a previous depressed left ventricular function, now more  nearly normal, who also has a history of sinus node dysfunction, second  degree heart block, and now complete heart block and device dependence.  He also has sinus node dysfunction.   He is doing quite well. He has no complaints of chest pain, shortness of  breath, or exercise intolerance.   MEDICATIONS:  His medications are notable for metoprolol 50 b.i.d.,  Altace 20, hydrochlorothiazide, and Glipizide.   PHYSICAL EXAMINATION:  VITAL SIGNS:  On examination, his blood pressure  is terrific at 108/62. His pulse is 76.  LUNGS:  Clear.  HEART:  Sounds were regular.  EXTREMITIES:  Without edema.   INTERROGATION:  Interrogation of his St. Jude pacesetter Synchrony pulse  generator demonstrates a P-wave or 5 with an impedance of 440 and  threshold of 1 volt at 0.6. There is no intrinsic ventricular rhythm.  The impedance was 601 with threshold of 1 volt at 0.4. Battery voltage  was 2.59 with an estimated longevity of 12 months.   IMPRESSION:  1. Sinus node dysfunction.  2. Complete heart block with device dependence.  3. Ischemic heart disease with:      a.     Prior bypass.      b.     Most recent ejection fraction of 50%.  4. Diabetes.   Mr. Garrette is doing well. Will plan to reassess his left ventricular  function prior to device generator replacement. This will help [place] decide  whether defibrillator is necessary or not, as well as whether we should  switch him to a short acting beta blocker or to a long acting one.    Sincerely,      Duke Salvia, MD, [facility]  Electronically  Signed    SCK/MedQ  DD: 01/23/2007  DT: 01/23/2007  Job #: [id number]

## 2010-11-22 NOTE — Op Note (Signed)
[name], [name]              ACCOUNT NO.:  [id number]   MEDICAL RECORD NO.:  [id number]          PATIENT TYPE:  INP   LOCATION:  2853                         FACILITY:  [facility]   PHYSICIAN:  Duke Salvia, MD, FACCDATE OF BIRTH:  1929/01/31   DATE OF PROCEDURE:  10/30/2007  DATE OF DISCHARGE:                               OPERATIVE REPORT   PREOPERATIVE DIAGNOSES:  Mr. Lowe has previously implanted pacemaker  for complete heart block and device is at end of life.  He comes in now  for device generator replacement.   POSTOPERATIVE DIAGNOSES:  Mr. Finelli has previously implanted pacemaker  for complete heart block and device is at end of life.  He comes in now  for device generator replacement.   PROCEDURE:  Explantation of a previously implanted device.   PROCEDURE IN DETAIL:  Following obtaining informed consent, the patient  was brought to the electrophysiology laboratory and placed in the  fluoroscopic table in the supine position.  After routine prepped and  draped, lidocaine was infiltrated along the line of previous incision  carried down to the layer to the prepectoral fascia.  Using the  electrocautery and sharp dissection, the pocket was opened.  The device  was explanted.  All the screws were released and given the patient is  device dependence, the atrial lead was removed first.  I could not  removed it from the header.  We then using a removal tool which allowed  for its much easier separation.  Interrogation of the previously  implanted 1242 leads serial [id number] demonstrated a P wave of 1.7 with  impendence of 530, threshold of 0.9 volts and 0.5 ms and current  threshold is 1.08 mA.   The ventricular lead had the same problem and thankfully it maintained  sufficient connectivity to pace until which time it was freed up  sufficiently with the removal tool to rule out the remove.  It was a  1246 T58 cm leads #[id number] and there was no intrinsic ventricular  rhythm.  The impendence was 689, threshold 1 volt and 0.5, the current  threshold is 0.9 mA.  These leads were then attached to [facility]. Jude Zephyr  pulse generator model [id number], serial # [id number].  AV pacing was identified.  The pocket was copiously irrigated with antibiotic containing saline  solution.  Hemostasis was reassured and the wound was then closed in 3  layers in the normal fashion.  The wound was washed and dried and  benzoin, Steri-Strip dressing was applied.  Needle counts, sponge  counts, and instrument counts were correct at the end of the procedure  according to the staff.      Duke Salvia, MD, [facility]  Electronically Signed     SCK/MEDQ  D:  10/30/2007  T:  10/31/2007  Job:  [id number]   cc:   Electrophysiology Laboratory  Patrica Duel, M.D.

## 2010-11-22 NOTE — Assessment & Plan Note (Signed)
[facility]                         ELECTROPHYSIOLOGY OFFICE NOTE   NAME:Neil [name], Neil [name]                     MRN:          [id number]  DATE:03/24/2008                            DOB:          [birth year]    Mr. Rouillard is seen in followup for complete heart block status post  pacemaker implantation and generator replacement in April 2009.  He  continues to do well without complaints of chest pain or shortness of  breath.   His medications include Benicar instead of Altace, metoprolol,  glipizide, and hydrochlorothiazide.   PHYSICAL EXAMINATION:  VITAL SIGNS:  His blood pressure is 108/71 and  pulse of 65.  LUNGS:  Clear.  HEART:  Sounds were regular.  EXTREMITIES:  Without edema.   Interrogation of his St. Jude Zephyr pulse generator demonstrates P-wave  of 1.5 with impedance of 463 and threshold of 1 volts at 0.5.  He has no  intrinsic ventricular rhythm, impedance was 634 and threshold was 0.875.  Battery voltage was 2.78.   We will plan to see Mr. Sprunger again in 9 months' time.  He is doing  quite well at this time.     Duke Salvia, MD, [facility]  Electronically Signed    SCK/MedQ  DD: 03/24/2008  DT: 03/25/2008  Job #: [id number]

## 2010-11-22 NOTE — Assessment & Plan Note (Signed)
[facility]                         ELECTROPHYSIOLOGY OFFICE NOTE   NAME:Neil [name], Neil [name]                     MRN:          [id number]  DATE:10/24/2007                            DOB:          1928/12/09    ALLERGIES:  TICLID.   PRIMARY CARE PHYSICIAN:  Dr. Patrica [name].   ELECTROPHYSIOLOGIST:  Dr. Duke [name].   PRESENTING CIRCUMSTANCE:  I think my pacemaker needs to be changed.   HISTORY OF PRESENT ILLNESS:  Neil [name] is a 77 year old male.  He has a  history in the past of a complete heart block and syncope.  The  determination shows that he has no intrinsic rhythm.  He had a Probation officer pacemaker implanted in February 1998.  Recent interrogation of  the device shows that it is at [facility].  At an office visit with Dr. Graciela [name]  in July 2008, mention was made by Dr. Graciela [name] of checking the patient's  ejection fraction to see if it had declined since the last  determination, when it was 50%.   An echocardiogram was done in the office today on October 24, 2007, which  showed that his systolic function was mildly decreased and trivial  mitral regurgitation.  This is a preliminary result but it is expected  that it would not fall below criteria for a cardioverter defibrillator.  The patient does have, however, long-term ventricular pacing from  complete heart block.  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  More of this later.   His other diagnoses include an ischemic cardiomyopathy.  He has had  coronary artery disease with a stent in the spring of 1998, and a  coronary artery bypass graft surgery in the fall of 1998.  The  pacemaker, as noted above, was implanted in the spring of 1998.  The  patient also has a history of hypertension, dyslipidemia and diabetes.   The patient is surprisingly active.  He has New York Heart Association  class II congestive heart failure symptoms.  He is working on his pecan  farm, directing it  and tending to his garden as spring opens up this  year.   CURRENT MEDICATIONS:  1. Enteric-coated aspirin 325 mg daily.  2. Lipitor 40 mg daily at bedtime.  3. Hydrochlorothiazide 12.5 mg daily.  4. Glipizide ER 5 mg twice daily.  5. Metoprolol 50 mg twice daily.  6. Altace 10 mg, two tab daily.  7. Nitroglycerin p.r.n.   PHYSICAL EXAMINATION:  GENERAL:  Alert and oriented x3.  He claims he is  not short of breath either at rest or with activity.  His activities, as  mentioned above, are daily and accomplished without undue sacrifice of  the patient.  HEART:  Heart rate 69, blood pressure 132/68, weight 205 pounds.  LUNGS:  Clear to auscultation bilaterally.  HEART:  A regular rate and rhythm.  ABDOMEN:  Soft, nondistended.  Bowel sounds present.  EXTREMITIES:  No evidence of edema, although there is mild edema,  however, on the right lower extremity.  This was the bypass extremity.   Electrocardiogram:  A  heart rate of 69, QRS duration 164 msec.  This  shows AV sequential pacing with intermittent innate atrial rhythm and V-  pacing.   PLAN:  The patient's pacemaker needs to be changed.  It is at elective  replacement indicator.  There is some thought that the patient would  benefit from a Bi-V pacemaker generator, due to left bundle branch  block.  The patient will have pacemaker generator change-out with  possible cardiac re-synchronization lead during the week of October 28, 2007.  He will be asked to hold his hypoglycemic medication prior to the  procedure.      Neil Mirza, PA  Electronically Signed      Neil Salvia, MD, [facility] : [facility]  Electronically Signed   GM/MedQ  DD: 10/24/2007  DT: 10/24/2007  Job #: [id number]

## 2010-11-22 NOTE — Discharge Summary (Signed)
NAMECROCKETT, RALLO              ACCOUNT NO.:  [id number]   MEDICAL RECORD NO.:  [id number]          PATIENT TYPE:  INP   LOCATION:  2853                         FACILITY:  [facility]   PHYSICIAN:  Maple Mirza, PA   DATE OF BIRTH:  26-Mar-1929   DATE OF ADMISSION:  10/30/2007  DATE OF DISCHARGE:  10/30/2007                               DISCHARGE SUMMARY   The patient has allergy to TICLID.   DICTATION TIME AND EXAM:  Greater than 30 minutes.   FINAL DIAGNOSES:  1. Explantation of existing pacemaker which is at elective replacement      indicator.  2. Implant of a [employer] Zephyr XL DR dual-chamber pacemaker.      This is a DDDR mode pacemaker.   SECONDARY DIAGNOSES:  1. History of syncope and complete heart block with pacemaker implant      February 1998.  2. New York Heart Association class II.  3. Congestive heart failure.  4. Ischemic cardiomyopathy status post stenting in the spring of 1998      and coronary artery bypass graft surgery in the fall of 1998.  5. Hypertension.  6. Dyslipidemia.  7. Diabetes.   Echocardiogram on October 24, 2007, with ejection fraction 40-50% only  mildly depressed.   Indication for cardiac resynchronization lead not clinically required.   PROCEDURE:  On October 30, 2007, explant of the existing St. Jude  Synchrony II pacemaker with implant of the St. Jude Zephyr dual-chamber  pacemaker by Dr. Sherryl Manges.  The patient has no postprocedural  complications, is not complaining of undue pain.  At the pacer pocket,  there is no hematoma there.  The patient is instructed to remove the  bandage on the morning, Thursday and leave the incision open to the air,  is to keep his incision dry for the next 7 days, to sponge bathe until  Wednesday November 06, 2007.   BRIEF HISTORY:  Mr. Langlinais  is a 77 year old male.  He has a history in  the past of complete heart block and syncope.  Determination of his  innate rhythm shows that he has no  intrinsic rhythm.  The device is now  at [facility].  At office visit October 24, 2007, a repeat echocardiogram was  taken which shows only moderate decline in ejection fraction.  His prior  ejection fraction was 50% and this has not changed.  The patient is  relatively active, has a New York Heart Association class II, chronic  systolic congestive failure.  He will present electively for generator  change out.   HOSPITAL COURSE:  The patient presents October 30, 2007.  He underwent  explantation of his device with implant of the new St. Jude Zephyr  device by Dr. Sherryl Manges, discharging the same day.   DISCHARGE MEDICATIONS:  1. Lipitor 40 mg daily at bedtime.  2. Hydrochlorothiazide 12.5 mg daily.  3. Glipizide ER 5 mg twice daily.  4. Metoprolol 50 mg twice daily.  5. Altace 10 mg 2 tablets daily.  6. Nitroglycerin as needed.   He has follow-up with [facility],  [address] [employer] at  the [employer], Thursday, Nov 14, 2007 at 9:20.  Protime on this  admission was 11.7, INR 1.0.  Sodium 138, potassium 4.2, chloride 102,  carbonate 30, glucose 162, BUN is 18, creatinine 1.2.  White cells 6.1,  hemoglobin 13.9, hematocrit 40.7 and platelets are 154.      Maple Mirza, PA     GM/MEDQ  D:  10/30/2007  T:  10/31/2007  Job:  [id number]   cc:   Patrica Duel, M.D.  Duke Salvia, MD, [facility]

## 2010-11-25 NOTE — Assessment & Plan Note (Signed)
[facility]                           ELECTROPHYSIOLOGY OFFICE NOTE   NAME:[name], [name]                     MRN:          [id number]  DATE:04/23/2006                            DOB:          September 05, 1928    Neil [name] is seen in the device clinic today, April 23, 2006, for routine  follow-up of his Pacesetter Synchrony II model 2022L dual chamber pacemaker  implanted February 1998, for bradycardia with heart block.  Battery voltage  upon interrogation measures 2.66 volts in the atrium.  Intrinsic amplitude  is 1.5 mV with an impedance of 473 ohms and threshold of 0.5 volts at 0.6  msec.  He is dependent to a rate of 30 in the ventricle.  Impedance measures  646 ohms and threshold is 1 volt at 0.4 msec.  No programming changes were  made at this time.  He will begin monthly phone checks with [employer] and be  seen in one year's time for follow-up.      ______________________________  Cleatrice Burke, RN    ______________________________  Duke Salvia, MD, [facility]    CF/MedQ  DD:  04/23/2006  DT:  04/24/2006  Job #:  [id number]

## 2010-11-25 NOTE — Letter (Signed)
April 09, 2006     Patrica Duel, M.D.  [address], [address]  [place], [place] [address]   RE:  Neil [name], Neil [name]  MRN:  [id number]  /  DOB:  1929-03-24   Dear Loraine Leriche:   It was a pleasure to see your nice patient, Neil [name], for follow-up on  April 09, 2006.  As you know, Neil [name] is a very nice 77 year old white married  male 9 years post coronary artery bypass graft surgery and permanent  transvenous demand pacemaker for a complete heart block.   In addition, Neil [name] has a history of hypertension, hyperlipidemia and diabetes.   The patient states Neil [name] has been getting along quite well with no cardiac  symptoms.   MEDICATIONS:  1. Aspirin 325 mg.  2. Lipitor 40 mg.  3. Hydrochlorothiazide 12.5 mg.  4. Glipizide ER 5 mg b.i.d.  5. Metoprolol 50 mg b.i.d.  6. Altace 20 mg daily.   The patient has also been followed by Dr. Graciela Husbands but has not been seen in  nearly 2 years.   Neil [name] had moderate depression of LV function in the past, EF of 40%, but has  not had a recent echo.   PHYSICAL EXAMINATION:  VITAL SIGNS:  Blood pressure 124/78, pulse normal  sinus rhythm.  GENERAL APPEARANCE:  Normal.  NECK:  JVP is not elevated.  Carotid pulses are palpable without bruits.  LUNGS:  Clear.  CARDIAC:  No murmur, gallop or rub.  ABDOMEN:  Unremarkable.  EXTREMITIES:  Unremarkable.   EKG reveals AV pacing.   IMPRESSION:  1. Coronary artery disease 9 years post coronary artery bypass graft and      post pacemaker implantation for complete heart block.  2. Hyperlipidemia, on therapy.  3. Hypertension, controlled.  4. Diabetes, on therapy.   I am certainly pleased that Neil [name] is getting along so well.  I  suggested a follow-up lipid and BMP and I think Neil [name] should also have a 2 D  echo.   His EKG today confirms AV pacing.   Thank you for the opportunity to share in this nice gentleman's care.  I  would like to see him back in 4 months or any other time Neil [name] so desires.    Sincerely,    ______________________________  E. Graceann Congress, MD, [facility]     EJL/MedQ  DD:  04/09/2006  DT:  04/10/2006  Job #:  [id number]

## 2010-11-25 NOTE — Letter (Signed)
October 04, 2006    Neil [name]. Neil [name], M.D.  [address]  [place], [place] [address]   RE:  Neil [name]  MRN:  [id number]  /  DOB:  1929-04-13   Dear Neil [name]:   It was certainly a pleasure to see your nice patient, Neil [name],  for followup on October 04, 2006, ten years following a CABG and permanent  transvenous demand pacemaker for a complete heart block.  In addition  the patient has hypertension, hyperlipidemia, and diabetes.   The patient has been quite stable with no cardiac symptoms.  He remains  quite active.  He has been followed by Dr. Graciela [name] concerning his  pacemaker.   MEDICATIONS:  1. Aspirin 325.  2. Lipitor 40.  3. Hydrochlorothiazide 12.5.  4. Glipizide 5 b.i.d.  5. Metoprolol 50 b.i.d.  6. Altace 20 daily.   MOST RECENT LABORATORY:  In October, revealed an LDL of 71.  Normal  LFTs.  And, normal renal profile.  His states that these have been done  in your office subsequent to that.   PHYSICAL EXAMINATION:  VITAL SIGNS:  Blood pressure 124/74, pulse 66 AV  pacing.  GENERAL APPEARANCE:  Normal.  NECK:  JVP is not elevated.  Carotid pulses palpable and equal without  bruits.  LUNGS:  Clear.  CARDIAC:  There is no murmur or gallop.  ABDOMEN:  Unremarkable.  EXTREMITIES:  Normal.   EKG reveals AV pacing.   IMPRESSION:  1. Coronary artery disease, ten years post coronary artery bypass      graft by Dr. Tressie [name], asymptomatic.  2. Permanent pacing for a complete heart block, stable.  3. Hyperlipidemia on therapy.  4. Hypertension, controlled.  5. Diabetes.   I have suggested the patient continue on the same therapy.  Follow up  with Dr. Graciela [name] in 4 months.  We should note that his most recent  echocardiogram in October 2007, revealed an EF of 50%.   I certainly appreciate the opportunity to participate in this nice  gentleman's care.    Sincerely,      E. Graceann Congress, MD, [name]  Electronically Signed    EJL/MedQ  DD:  10/04/2006  DT: 10/04/2006  Job #: [id number]

## 2010-12-30 ENCOUNTER — Other Ambulatory Visit: Payer: Self-pay | Admitting: *Deleted

## 2010-12-30 MED ORDER — METOPROLOL TARTRATE 50 MG PO TABS: 50.0000 mg | ORAL_TABLET | Freq: Two times a day (BID) | ORAL | Status: DC

## 2011-01-24 DIAGNOSIS — I495 Sick sinus syndrome: Secondary | ICD-10-CM

## 2011-01-25 ENCOUNTER — Encounter: Payer: Self-pay | Admitting: Internal Medicine

## 2011-03-23 ENCOUNTER — Other Ambulatory Visit: Payer: Self-pay | Admitting: Internal Medicine

## 2011-03-24 NOTE — Telephone Encounter (Signed)
Pt out of med , can refill be called in today?

## 2011-04-19 ENCOUNTER — Encounter: Payer: Self-pay | Admitting: Internal Medicine

## 2011-04-20 ENCOUNTER — Ambulatory Visit (INDEPENDENT_AMBULATORY_CARE_PROVIDER_SITE_OTHER): Payer: Medicare Other | Admitting: Internal Medicine

## 2011-04-20 ENCOUNTER — Encounter: Payer: Self-pay | Admitting: Internal Medicine

## 2011-04-20 VITALS — BP 109/63 | HR 69 | Ht 69.0 in | Wt 191.0 lb

## 2011-04-20 DIAGNOSIS — I4891 Unspecified atrial fibrillation: Secondary | ICD-10-CM

## 2011-04-20 DIAGNOSIS — E785 Hyperlipidemia, unspecified: Secondary | ICD-10-CM

## 2011-04-20 DIAGNOSIS — I442 Atrioventricular block, complete: Secondary | ICD-10-CM

## 2011-04-20 DIAGNOSIS — R55 Syncope and collapse: Secondary | ICD-10-CM

## 2011-04-20 DIAGNOSIS — I25810 Atherosclerosis of coronary artery bypass graft(s) without angina pectoris: Secondary | ICD-10-CM

## 2011-04-20 DIAGNOSIS — I701 Atherosclerosis of renal artery: Secondary | ICD-10-CM

## 2011-04-20 DIAGNOSIS — Z950 Presence of cardiac pacemaker: Secondary | ICD-10-CM

## 2011-04-20 LAB — PACEMAKER DEVICE OBSERVATION
AL IMPEDENCE PM: 452 Ohm
ATRIAL PACING PM: 90
BRDY-0002RV: 70 {beats}/min
DEVICE MODEL PM: 2057568
RV LEAD AMPLITUDE: 2 mv
RV LEAD THRESHOLD: 0.75 V

## 2011-04-20 NOTE — Progress Notes (Signed)
  HPI  Neil [name] is a 77 y.o. male is seen in followup for ischemic heart disease prior bypass surgery and is status post pacemaker implantation for complete heart block.  Last year he developed atrial fibrillation which manifested as fatigue and dizziness. He underwent cardioversion with significant improvement in exercise performance. He has intercurrently gone back into atrial fibrillation; however, both he and his wife says there has been no significant untoward effects on exercise performance.   The patient denies chest pain, edema shortness of breath or palpitations Past Medical History  Diagnosis Date  . Pacemaker     St Jude, with prior cabg and stenting  . Syncope and collapse   . Hypertension   . Diabetes mellitus   . Coronary artery disease   . Complete heart block   . Dyslipidemia   . Renal artery stenosis     95%  left, 50% right  . Osteoarthritis     Past Surgical History  Procedure Date  . Pacemaker insertion   . Coronary artery bypass graft     with stenting    Current Outpatient Prescriptions  Medication Sig Dispense Refill  . atorvastatin (LIPITOR) 40 MG tablet Take 40 mg by mouth daily.        Marland Kitchen glipiZIDE (GLUCOTROL) 5 MG tablet Take 5 mg by mouth 2 (two) times daily before a meal.        . hydrochlorothiazide (,MICROZIDE/HYDRODIURIL,) 12.5 MG capsule Take 1 capsule (12.5 mg total) by mouth daily.  30 capsule  11  . metoprolol (LOPRESSOR) 50 MG tablet Take 1 tablet (50 mg total) by mouth 2 (two) times daily.  60 tablet  8  . olmesartan (BENICAR) 20 MG tablet Take 1 tablet (20 mg total) by mouth daily.  30 tablet  11  . PRADAXA 150 MG CAPS TAKE ONE CAPSULE TWICE DAILY  60 capsule  6    Allergies  Allergen Reactions  . Penicillins     Review of Systems negative except from HPI and PMH  Physical Exam Well developed and well nourished in no acute distress HENT normal E scleral and icterus clear Neck Supple JVP flat; carotids brisk and  full Clear to ausculation Regular rate and rhythm, no murmurs gallops or rub Soft with active bowel sounds No clubbing cyanosis and edema Alert and oriented, grossly normal motor and sensory function Skin Warm and Dry   Assessment and  Plan

## 2011-04-20 NOTE — Assessment & Plan Note (Signed)
Given his diuretics and ARB, we'll check his metabolic profile today

## 2011-04-20 NOTE — Assessment & Plan Note (Signed)
Stable without symptoms of ischemia

## 2011-04-20 NOTE — Assessment & Plan Note (Signed)
Persistent atrial fibrillation. He is on Pradaxa. We had a discussion again about the risk benefits and a legal commercials related to it

## 2011-04-20 NOTE — Patient Instructions (Signed)
Your physician wants you to follow-up in: 1 year with Dr. Graciela Husbands. You will receive a reminder letter in the mail two months in advance. If you don't receive a letter, please call our office to schedule the follow-up appointment.  Your physician recommends that you have lab work today: bmp/magnesium  Your physician recommends that you continue on your current medications as directed. Please refer to the Current Medication list given to you today.

## 2011-04-20 NOTE — Assessment & Plan Note (Signed)
No recurrent syncope 

## 2011-04-20 NOTE — Assessment & Plan Note (Signed)
Lipids were last checked in January by his PCP

## 2011-04-21 LAB — BASIC METABOLIC PANEL
BUN: 32 mg/dL — ABNORMAL HIGH (ref 6–23)
CO2: 28 mEq/L (ref 19–32)
Creatinine, Ser: 1.7 mg/dL — ABNORMAL HIGH (ref 0.4–1.5)
GFR: 41.94 mL/min — ABNORMAL LOW (ref >60.00)
Glucose, Bld: 187 mg/dL — ABNORMAL HIGH (ref 70–99)
Potassium: 4.6 mEq/L (ref 3.5–5.1)

## 2011-04-21 LAB — MAGNESIUM: Magnesium: 2.3 mg/dL (ref 1.5–2.5)

## 2011-04-27 ENCOUNTER — Other Ambulatory Visit: Payer: Self-pay | Admitting: *Deleted

## 2011-04-27 DIAGNOSIS — N289 Disorder of kidney and ureter, unspecified: Secondary | ICD-10-CM

## 2011-05-18 ENCOUNTER — Other Ambulatory Visit (INDEPENDENT_AMBULATORY_CARE_PROVIDER_SITE_OTHER): Payer: Medicare Other | Admitting: *Deleted

## 2011-05-18 DIAGNOSIS — N289 Disorder of kidney and ureter, unspecified: Secondary | ICD-10-CM

## 2011-05-18 LAB — BASIC METABOLIC PANEL
Chloride: 106 mEq/L (ref 96–112)
Glucose, Bld: 104 mg/dL — ABNORMAL HIGH (ref 70–99)
Sodium: 141 mEq/L (ref 135–145)

## 2011-07-26 ENCOUNTER — Encounter: Payer: Self-pay | Admitting: Internal Medicine

## 2011-07-26 DIAGNOSIS — I495 Sick sinus syndrome: Secondary | ICD-10-CM

## 2011-10-23 DIAGNOSIS — Z85828 Personal history of other malignant neoplasm of skin: Secondary | ICD-10-CM | POA: Diagnosis not present

## 2011-10-23 DIAGNOSIS — L570 Actinic keratosis: Secondary | ICD-10-CM | POA: Diagnosis not present

## 2011-10-23 DIAGNOSIS — D235 Other benign neoplasm of skin of trunk: Secondary | ICD-10-CM | POA: Diagnosis not present

## 2011-10-25 ENCOUNTER — Encounter: Payer: Self-pay | Admitting: Internal Medicine

## 2011-10-25 DIAGNOSIS — I495 Sick sinus syndrome: Secondary | ICD-10-CM | POA: Diagnosis not present

## 2011-11-10 DIAGNOSIS — E785 Hyperlipidemia, unspecified: Secondary | ICD-10-CM | POA: Diagnosis not present

## 2011-11-10 DIAGNOSIS — Z125 Encounter for screening for malignant neoplasm of prostate: Secondary | ICD-10-CM | POA: Diagnosis not present

## 2011-11-10 DIAGNOSIS — L0390 Cellulitis, unspecified: Secondary | ICD-10-CM | POA: Diagnosis not present

## 2011-11-10 DIAGNOSIS — I10 Essential (primary) hypertension: Secondary | ICD-10-CM | POA: Diagnosis not present

## 2011-11-10 DIAGNOSIS — Z6831 Body mass index (BMI) 31.0-31.9, adult: Secondary | ICD-10-CM | POA: Diagnosis not present

## 2011-11-10 DIAGNOSIS — L0291 Cutaneous abscess, unspecified: Secondary | ICD-10-CM | POA: Diagnosis not present

## 2011-11-10 DIAGNOSIS — E119 Type 2 diabetes mellitus without complications: Secondary | ICD-10-CM | POA: Diagnosis not present

## 2012-01-24 DIAGNOSIS — I495 Sick sinus syndrome: Secondary | ICD-10-CM | POA: Diagnosis not present

## 2012-03-15 ENCOUNTER — Ambulatory Visit (HOSPITAL_COMMUNITY)
Admission: RE | Admit: 2012-03-15 | Discharge: 2012-03-15 | Disposition: A | Discharge status: Home / Self Care | Payer: Medicare Other | Source: Ambulatory Visit | Attending: Internal Medicine | Admitting: Internal Medicine

## 2012-03-15 ENCOUNTER — Other Ambulatory Visit (HOSPITAL_COMMUNITY): Payer: Self-pay | Admitting: Internal Medicine

## 2012-03-15 DIAGNOSIS — I10 Essential (primary) hypertension: Secondary | ICD-10-CM | POA: Diagnosis not present

## 2012-03-15 DIAGNOSIS — M79609 Pain in unspecified limb: Secondary | ICD-10-CM | POA: Diagnosis not present

## 2012-03-15 DIAGNOSIS — Z6831 Body mass index (BMI) 31.0-31.9, adult: Secondary | ICD-10-CM | POA: Diagnosis not present

## 2012-03-15 DIAGNOSIS — E785 Hyperlipidemia, unspecified: Secondary | ICD-10-CM | POA: Diagnosis not present

## 2012-03-15 DIAGNOSIS — I2510 Atherosclerotic heart disease of native coronary artery without angina pectoris: Secondary | ICD-10-CM | POA: Diagnosis not present

## 2012-03-15 DIAGNOSIS — Z23 Encounter for immunization: Secondary | ICD-10-CM | POA: Diagnosis not present

## 2012-04-02 DIAGNOSIS — E1139 Type 2 diabetes mellitus with other diabetic ophthalmic complication: Secondary | ICD-10-CM | POA: Diagnosis not present

## 2012-04-02 DIAGNOSIS — Z0100 Encounter for examination of eyes and vision without abnormal findings: Secondary | ICD-10-CM | POA: Diagnosis not present

## 2012-04-02 DIAGNOSIS — H524 Presbyopia: Secondary | ICD-10-CM | POA: Diagnosis not present

## 2012-04-02 DIAGNOSIS — H52229 Regular astigmatism, unspecified eye: Secondary | ICD-10-CM | POA: Diagnosis not present

## 2012-04-12 DIAGNOSIS — H2510 Age-related nuclear cataract, unspecified eye: Secondary | ICD-10-CM | POA: Diagnosis not present

## 2012-04-18 DIAGNOSIS — H2589 Other age-related cataract: Secondary | ICD-10-CM | POA: Diagnosis not present

## 2012-04-18 DIAGNOSIS — H2510 Age-related nuclear cataract, unspecified eye: Secondary | ICD-10-CM | POA: Diagnosis not present

## 2012-05-06 DIAGNOSIS — E11311 Type 2 diabetes mellitus with unspecified diabetic retinopathy with macular edema: Secondary | ICD-10-CM | POA: Diagnosis not present

## 2012-05-10 ENCOUNTER — Encounter: Payer: Self-pay | Admitting: Internal Medicine

## 2012-05-10 ENCOUNTER — Ambulatory Visit (INDEPENDENT_AMBULATORY_CARE_PROVIDER_SITE_OTHER): Payer: Medicare Other | Admitting: Internal Medicine

## 2012-05-10 VITALS — BP 137/84 | HR 70 | Ht 70.0 in | Wt 191.0 lb

## 2012-05-10 DIAGNOSIS — Z950 Presence of cardiac pacemaker: Secondary | ICD-10-CM

## 2012-05-10 DIAGNOSIS — I4891 Unspecified atrial fibrillation: Secondary | ICD-10-CM

## 2012-05-10 DIAGNOSIS — I509 Heart failure, unspecified: Secondary | ICD-10-CM

## 2012-05-10 DIAGNOSIS — I25810 Atherosclerosis of coronary artery bypass graft(s) without angina pectoris: Secondary | ICD-10-CM

## 2012-05-10 DIAGNOSIS — I442 Atrioventricular block, complete: Secondary | ICD-10-CM

## 2012-05-10 DIAGNOSIS — I5022 Chronic systolic (congestive) heart failure: Secondary | ICD-10-CM | POA: Insufficient documentation

## 2012-05-10 LAB — PACEMAKER DEVICE OBSERVATION
BATTERY VOLTAGE: 2.74 V
BRDY-0002RV: 70 {beats}/min
BRDY-0004RV: 120 {beats}/min
RV LEAD IMPEDENCE PM: 599 Ohm
RV LEAD THRESHOLD: 0.75 V

## 2012-05-10 MED ORDER — FUROSEMIDE 20 MG PO TABS: 20.0000 mg | ORAL_TABLET | ORAL | Status: DC

## 2012-05-10 NOTE — Progress Notes (Signed)
Patient Care Team: Elfredia Nevins, MD as PCP - General (Internal Medicine)   HPI  Neil [name] is a 78 y.o. male  is seen in followup for ischemic heart disease prior bypass surgery and is status post pacemaker implantation for complete heart block.  Last year he developed atrial fibrillation which manifested as fatigue and dizziness. He underwent cardioversion with significant improvement in exercise performance. He has intercurrently gone back into atrial fibrillation; however, both he and his wife says there has been no significant untoward effects on exercise performance.   He comes in with complaints now of progressive exercise intolerance accompanied by some peripheral edema. He denies accompanying chest pain.  Past Medical History  Diagnosis Date  . Pacemaker     St Jude status post generator replacement 2007  . Complete heart block   . Hypertension   . Diabetes mellitus   . Coronary artery disease     status post CABG1999  . Atrial fibrillation   . Dyslipidemia   . Renal artery stenosis     95%  left, 50% right  . Osteoarthritis     Past Surgical History  Procedure Date  . Pacemaker insertion   . Coronary artery bypass graft     with stenting    Current Outpatient Prescriptions  Medication Sig Dispense Refill  . atorvastatin (LIPITOR) 40 MG tablet Take 40 mg by mouth daily.        Marland Kitchen glipiZIDE (GLUCOTROL) 5 MG tablet Take 5 mg by mouth 2 (two) times daily before a meal.        . metoprolol (LOPRESSOR) 50 MG tablet Take 50 mg by mouth daily.      Marland Kitchen olmesartan (BENICAR) 20 MG tablet Take 1 tablet (20 mg total) by mouth daily.  30 tablet  11  . PRADAXA 150 MG CAPS TAKE ONE CAPSULE TWICE DAILY  60 capsule  6  . DISCONTD: metoprolol (LOPRESSOR) 50 MG tablet Take 1 tablet (50 mg total) by mouth 2 (two) times daily.  60 tablet  8    Allergies  Allergen Reactions  . Penicillins     Review of Systems negative except from HPI and PMH  Physical Exam BP 137/84   Pulse 70  Ht 5\' 10"  (1.778 m)  Wt 191 lb (86.637 kg)  BMI 27.41 kg/m2 Well developed and well nourished in no acute distress HENT normal E scleral and icterus clear Neck Supple JVP 9-10  carotids brisk and full Clear to ausculation Regular rate and rhythm, no murmurs gallops or rub Soft with active bowel sounds No clubbing cyanosis 2+ and none Edema Alert and oriented, grossly normal motor and sensory function Skin Warm and Dry  Atrial fibrillation with ventricular pacing  Assessment and  Plan

## 2012-05-10 NOTE — Assessment & Plan Note (Signed)
As above.

## 2012-05-10 NOTE — Assessment & Plan Note (Signed)
His atrial fibrillation has been persistent over the last year. Reviewing the chart, on one occasion cardioversion was associated with improvement;  on other occasions it was also clear that it was. It I think reassessment of   left atrial dimension and left ventricular function but potentially offer [place] insight as to strategies only for managing his atrial fibrillation but also to explain his worsening exercise tolerance.  He has coronary artery disease remote bypass grafting. We need to exclude ischemia we'll undertake a Myoview scan. With his pacing, we will begin assessment of left ventricular function. The family will think about cardioversion in the event they want to  consider it, we'll undertake an echocardiogram to look at left atrial dimension.  In addition there is evidence of volume overload today. We will begin him on every other day Lasix and I will be in touch within a couple of weeks when I return both review his clinical status and to inform him of the results of this testing

## 2012-05-10 NOTE — Patient Instructions (Addendum)
Your physician has requested that you have a lexiscan myoview. For further information please visit [email or web address] Please follow instruction sheet, as given.  Start Lasix 20mg  every other day for 2 weeks.

## 2012-05-10 NOTE — Assessment & Plan Note (Signed)
We'll undertake an echo to assess left ventricular function and this will help direct medical therapy as well as give [place] information as left atrial dimension which inform our decisions regarding atrial fibrillation

## 2012-05-23 ENCOUNTER — Ambulatory Visit (HOSPITAL_COMMUNITY): Payer: Medicare Other | Attending: Cardiovascular Disease | Admitting: Radiology

## 2012-05-23 ENCOUNTER — Ambulatory Visit (HOSPITAL_BASED_OUTPATIENT_CLINIC_OR_DEPARTMENT_OTHER): Payer: Medicare Other | Admitting: Radiology

## 2012-05-23 ENCOUNTER — Encounter (HOSPITAL_COMMUNITY): Payer: Medicare Other

## 2012-05-23 VITALS — BP 150/83 | Ht 70.0 in | Wt 188.0 lb

## 2012-05-23 DIAGNOSIS — I2510 Atherosclerotic heart disease of native coronary artery without angina pectoris: Secondary | ICD-10-CM

## 2012-05-23 DIAGNOSIS — I25810 Atherosclerosis of coronary artery bypass graft(s) without angina pectoris: Secondary | ICD-10-CM

## 2012-05-23 DIAGNOSIS — I10 Essential (primary) hypertension: Secondary | ICD-10-CM | POA: Diagnosis not present

## 2012-05-23 DIAGNOSIS — Z8249 Family history of ischemic heart disease and other diseases of the circulatory system: Secondary | ICD-10-CM | POA: Diagnosis not present

## 2012-05-23 DIAGNOSIS — R0602 Shortness of breath: Secondary | ICD-10-CM

## 2012-05-23 DIAGNOSIS — E119 Type 2 diabetes mellitus without complications: Secondary | ICD-10-CM | POA: Insufficient documentation

## 2012-05-23 DIAGNOSIS — I509 Heart failure, unspecified: Secondary | ICD-10-CM | POA: Insufficient documentation

## 2012-05-23 DIAGNOSIS — R0609 Other forms of dyspnea: Secondary | ICD-10-CM | POA: Insufficient documentation

## 2012-05-23 DIAGNOSIS — I4891 Unspecified atrial fibrillation: Secondary | ICD-10-CM | POA: Diagnosis not present

## 2012-05-23 DIAGNOSIS — I739 Peripheral vascular disease, unspecified: Secondary | ICD-10-CM | POA: Diagnosis not present

## 2012-05-23 DIAGNOSIS — R42 Dizziness and giddiness: Secondary | ICD-10-CM | POA: Diagnosis not present

## 2012-05-23 DIAGNOSIS — I442 Atrioventricular block, complete: Secondary | ICD-10-CM

## 2012-05-23 DIAGNOSIS — Z950 Presence of cardiac pacemaker: Secondary | ICD-10-CM

## 2012-05-23 DIAGNOSIS — R0989 Other specified symptoms and signs involving the circulatory and respiratory systems: Secondary | ICD-10-CM | POA: Insufficient documentation

## 2012-05-23 MED ORDER — TECHNETIUM TC 99M SESTAMIBI GENERIC - CARDIOLITE
10.0000 | Freq: Once | INTRAVENOUS | Status: AC | PRN
  Administered 2012-05-23: 10 via INTRAVENOUS

## 2012-05-23 MED ORDER — TECHNETIUM TC 99M SESTAMIBI GENERIC - CARDIOLITE
30.0000 | Freq: Once | INTRAVENOUS | Status: AC | PRN
  Administered 2012-05-23: 30 via INTRAVENOUS

## 2012-05-23 MED ORDER — ADENOSINE (DIAGNOSTIC) 3 MG/ML IV SOLN
0.5600 mg/kg | Freq: Once | INTRAVENOUS | Status: AC
  Administered 2012-05-23: 47.7 mg via INTRAVENOUS

## 2012-05-23 NOTE — Progress Notes (Signed)
[facility] SITE 3 NUCLEAR MED [id number] [place] [address] [phone]  Cardiology Nuclear Med Study  FAUSTINO LUECKE is a 78 y.o. male     MRN : [id number]     DOB: 1929-05-26  Procedure Date: 05/23/2012  Nuclear Med Background Indication for Stress Test:  Evaluation for Ischemia and Graft Patency History:  Afib; CHF; ICM; '98-CABG x 4; '07-Pacer; '08 MPS-mild anterior ischemia, EF 42%, global HK; '09 Echo-EF 40-50%; '12 Cardioversion Cardiac Risk Factors: Family History - CAD, Hypertension, Lipids, NIDDM and PVD (bilat RAS)  Symptoms:  Dizziness, DOE and Fatigue   Nuclear Pre-Procedure Caffeine/Decaff Intake:  None NPO After: 8:00pm   Lungs:  clear O2 Sat: 98% on room air. IV 0.9% NS with Angio Cath:  22g  IV Site: R Antecubital  IV Started by:  Milana Na, EMT-P  Chest Size (in):  44 Cup Size: n/a  Height: 5\' 10"  (1.778 m)  Weight:  188 lb (85.276 kg)  BMI:  Body mass index is 26.98 kg/(m^2). Tech Comments:  meds taken this am; CBG 105;    Nuclear Med Study 1 or 2 day study: 1 day  Stress Test Type:  Adenosine  Reading MD: Kristeen Miss, MD  Order Authorizing Provider:  Odessa Fleming, MD  Resting Radionuclide: Technetium 74m Sestamibi  Resting Radionuclide Dose: 11.0 mCi   Stress Radionuclide:  Technetium 47m Sestamibi  Stress Radionuclide Dose: 33.0 mCi           Stress Protocol Rest HR: 70 Stress HR: 70  Rest BP: 150/83 Stress BP: 165/73  Exercise Time (min): n/a METS: n/a   Predicted Max HR: 139 bpm % Max HR: 50.36 bpm Rate Pressure Product: 24401   Dose of Adenosine (mg):  47.9mg  Dose of Lexiscan: n/a mg  Dose of Atropine (mg): n/a Dose of Dobutamine: n/a mcg/kg/min (at max HR)  Stress Test Technologist: Bonnita Levan, RN  Nuclear Technologist:  Domenic Polite, CNMT     Rest Procedure:  Myocardial perfusion imaging was performed at rest 45 minutes following the intravenous administration of Technetium 90m  Sestamibi. Rest ECG: V Paced  Stress Procedure:  The patient received IV adenosine at 140 mcg/kg/min for 4 minutes.  There were no significant changes with infusion.  Technetium 24m Tetrofosmin was injected at the 2 minute mark and quantitative spect images were obtained after a 45 minute delay. Stress ECG: No significant change from baseline ECG.  The patient was paced at 70 throughout the stress test..  QPS Raw Data Images:  Normal; no motion artifact; normal heart/lung ratio. Stress Images:  Normal homogeneous uptake in all areas of the myocardium. Rest Images:  Normal homogeneous uptake in all areas of the myocardium. Subtraction (SDS):  No evidence of ischemia. Transient Ischemic Dilatation (Normal <1.22):  1.04 Lung/Heart Ratio (Normal <0.45):  0.41  Quantitative Gated Spect Images QGS EDV:  161 ml QGS ESV:  112 ml  Impression Exercise Capacity:  Adenosine study with no exercise. BP Response:  Normal blood pressure response. Clinical Symptoms:  No significant symptoms noted. ECG Impression:  No significant ST segment change suggestive of ischemia. Comparison with Prior Nuclear Study: No images to compare  Overall Impression:  Low risk stress nuclear study.  There is no evidence of ischemia.   LV Ejection Fraction: 31%.  LV Wall Motion:  The LV is moderately - markedly enlarged.  There is global hypokinesis but there is severe hypokinesis of the inferior wall .    Deloris Ping.  Fran Lowes., MD, [facility] 05/23/2012, 3:09 PM Office - [phone] Pager [phone]

## 2012-05-23 NOTE — Progress Notes (Signed)
Echocardiogram performed.  

## 2012-06-25 ENCOUNTER — Encounter: Payer: Self-pay | Admitting: Internal Medicine

## 2012-06-25 ENCOUNTER — Ambulatory Visit (INDEPENDENT_AMBULATORY_CARE_PROVIDER_SITE_OTHER): Payer: Medicare Other | Admitting: Internal Medicine

## 2012-06-25 VITALS — BP 128/73 | HR 70 | Wt 188.0 lb

## 2012-06-25 DIAGNOSIS — I442 Atrioventricular block, complete: Secondary | ICD-10-CM | POA: Diagnosis not present

## 2012-06-25 DIAGNOSIS — I2589 Other forms of chronic ischemic heart disease: Secondary | ICD-10-CM | POA: Diagnosis not present

## 2012-06-25 DIAGNOSIS — I5022 Chronic systolic (congestive) heart failure: Secondary | ICD-10-CM | POA: Diagnosis not present

## 2012-06-25 DIAGNOSIS — I4891 Unspecified atrial fibrillation: Secondary | ICD-10-CM | POA: Diagnosis not present

## 2012-06-25 DIAGNOSIS — I255 Ischemic cardiomyopathy: Secondary | ICD-10-CM

## 2012-06-25 LAB — PACEMAKER DEVICE OBSERVATION
BATTERY VOLTAGE: 2.76 v
BMOD-0002RV: 10
BRDY-0002RV: 70 {beats}/min
BRDY-0004RV: 120 {beats}/min
DEVICE MODEL PM: 2057568
RV LEAD IMPEDENCE PM: 598 Ohm
RV LEAD THRESHOLD: 0.75 v
VENTRICULAR PACING PM: 99

## 2012-06-25 MED ORDER — FUROSEMIDE 20 MG PO TABS: 20.0000 mg | ORAL_TABLET | ORAL | Status: DC

## 2012-06-25 MED ORDER — CARVEDILOL 6.25 MG PO TABS: 6.2500 mg | ORAL_TABLET | Freq: Two times a day (BID) | ORAL | Status: DC

## 2012-06-25 NOTE — Patient Instructions (Addendum)
Your physician wants you to follow-up in: 6 months with Dr. Graciela Husbands. You will receive a reminder letter in the mail two months in advance. If you don't receive a letter, please call our office to schedule the follow-up appointment.  Stop taking Lopressor.  Start Coreg 6.25mg  twice daily.  Start taking Lasix only twice per week.

## 2012-06-25 NOTE — Assessment & Plan Note (Signed)
permanetn

## 2012-06-25 NOTE — Assessment & Plan Note (Addendum)
The patient is euvolemic and feeling better. We'll continue him on low-dose Lasix.  Will also switch him from Lopressor to which she is taking once a day to carvedilol twice a day given his left ventricular dysfunction. Will anticipate reviewing situation the spring and consider at that time again CRT upgrade but he is improved at this point we'll hold off on that

## 2012-06-25 NOTE — Progress Notes (Signed)
Patient Care Team: Elfredia Nevins, MD as PCP - General (Internal Medicine)   HPI  Neil [name] is a 78 y.o. male is seen in followup for ischemic heart disease prior bypass surgery and is status post pacemaker implantation for complete heart block.  Last year he developed atrial fibrillation which manifested as fatigue and dizziness. He underwent cardioversion with significant improvement in exercise performance. He has intercurrently gone back into atrial fibrillation; however, both he and his wife says there has been no significant untoward effects on exercise performance.   He was seen a month ago with complaints of exercise intolerance. He underwent assess for ventricular function with a Myoview demonstrating an EF of 31% enlarged chamber but no ischemia. Echo demonstrated left atrial enlargement and right ventricular dilatation with left ventricular dysfunction  Is improved since his last visit we put him on Lasix. We will continue his Lasix 2  times a week indefinitely   Past Medical History  Diagnosis Date  . Pacemaker     St Jude status post generator replacement 2007  . Complete heart block   . Hypertension   . Diabetes mellitus   . Coronary artery disease     status post CABG1999  . Atrial fibrillation   . Dyslipidemia   . Renal artery stenosis     95%  left, 50% right  . Osteoarthritis     Past Surgical History  Procedure Date  . Pacemaker insertion   . Coronary artery bypass graft     with stenting    Current Outpatient Prescriptions  Medication Sig Dispense Refill  . atorvastatin (LIPITOR) 40 MG tablet Take 40 mg by mouth daily.        . furosemide (LASIX) 20 MG tablet Take 1 tablet (20 mg total) by mouth every other day.  30 tablet  0  . glipiZIDE (GLUCOTROL) 5 MG tablet Take 5 mg by mouth 2 (two) times daily before a meal.        . metoprolol (LOPRESSOR) 50 MG tablet Take 50 mg by mouth daily.      Marland Kitchen olmesartan (BENICAR) 20 MG tablet Take 1 tablet (20 mg  total) by mouth daily.  30 tablet  11  . PRADAXA 150 MG CAPS TAKE ONE CAPSULE TWICE DAILY  60 capsule  6    Allergies  Allergen Reactions  . Penicillins     Review of Systems negative except from HPI and PMH  Physical Exam BP 128/73  Pulse 70  Wt 188 lb (85.276 kg) Well developed and nourished in no acute distress HENT normal Neck supple with JVP-flat Clear Regular rate and rhythm, no murmurs or gallops Abd-soft with active BS No Clubbing cyanosis edema Skin-warm and dry A & Oriented  Grossly normal sensory and motor function    Assessment and  Plan

## 2012-06-25 NOTE — Assessment & Plan Note (Signed)
As above Will also consider aldactone

## 2012-06-26 ENCOUNTER — Telehealth: Payer: Self-pay | Admitting: Internal Medicine

## 2012-06-26 NOTE — Telephone Encounter (Signed)
Called pharmacy, they had a question about the decrease in furosemide (LASIX) 20 MG tablets.  Informed them that on his OV -- 06-25-2012 Dr. Graciela Husbands had reduced his furosemide (LASIX) 20 MG tablets to two times a week, as before the direction read every other day.  Pharmacy stated they would confirm the change in directions with the pt to make sure he understands he will be only take furosemide (LASIX) 20 MG tablets  -- two times a week.  Caralee Ates, CMA

## 2012-06-26 NOTE — Telephone Encounter (Signed)
plz return call to [phone] [facility] regarding pt RX and dosage furosemide .

## 2012-07-24 ENCOUNTER — Encounter: Payer: Self-pay | Admitting: Internal Medicine

## 2012-07-24 DIAGNOSIS — I495 Sick sinus syndrome: Secondary | ICD-10-CM | POA: Diagnosis not present

## 2012-08-09 DIAGNOSIS — E11319 Type 2 diabetes mellitus with unspecified diabetic retinopathy without macular edema: Secondary | ICD-10-CM | POA: Diagnosis not present

## 2012-08-09 DIAGNOSIS — E1139 Type 2 diabetes mellitus with other diabetic ophthalmic complication: Secondary | ICD-10-CM | POA: Diagnosis not present

## 2012-08-09 DIAGNOSIS — I10 Essential (primary) hypertension: Secondary | ICD-10-CM | POA: Diagnosis not present

## 2012-08-09 DIAGNOSIS — E1165 Type 2 diabetes mellitus with hyperglycemia: Secondary | ICD-10-CM | POA: Diagnosis not present

## 2012-08-09 DIAGNOSIS — Z6831 Body mass index (BMI) 31.0-31.9, adult: Secondary | ICD-10-CM | POA: Diagnosis not present

## 2012-10-23 DIAGNOSIS — I495 Sick sinus syndrome: Secondary | ICD-10-CM | POA: Diagnosis not present

## 2012-12-12 ENCOUNTER — Encounter: Payer: Self-pay | Admitting: Internal Medicine

## 2012-12-24 ENCOUNTER — Encounter: Payer: Self-pay | Admitting: Internal Medicine

## 2012-12-24 ENCOUNTER — Ambulatory Visit (INDEPENDENT_AMBULATORY_CARE_PROVIDER_SITE_OTHER): Payer: Medicare Other | Admitting: Internal Medicine

## 2012-12-24 VITALS — BP 146/78 | HR 70 | Ht 70.0 in | Wt 191.0 lb

## 2012-12-24 DIAGNOSIS — I5022 Chronic systolic (congestive) heart failure: Secondary | ICD-10-CM | POA: Diagnosis not present

## 2012-12-24 DIAGNOSIS — I2589 Other forms of chronic ischemic heart disease: Secondary | ICD-10-CM

## 2012-12-24 DIAGNOSIS — I10 Essential (primary) hypertension: Secondary | ICD-10-CM

## 2012-12-24 DIAGNOSIS — I255 Ischemic cardiomyopathy: Secondary | ICD-10-CM

## 2012-12-24 DIAGNOSIS — I442 Atrioventricular block, complete: Secondary | ICD-10-CM | POA: Diagnosis not present

## 2012-12-24 DIAGNOSIS — Z950 Presence of cardiac pacemaker: Secondary | ICD-10-CM

## 2012-12-24 LAB — PACEMAKER DEVICE OBSERVATION
BATTERY VOLTAGE: 2.76 V
BRDY-0002RV: 70 {beats}/min
DEVICE MODEL PM: 2057568
RV LEAD IMPEDENCE PM: 574 Ohm
VENTRICULAR PACING PM: 98

## 2012-12-24 MED ORDER — FUROSEMIDE 20 MG PO TABS: 20.0000 mg | ORAL_TABLET | ORAL | Status: DC

## 2012-12-24 NOTE — Patient Instructions (Signed)
Your physician wants you to follow-up in:   YEAR WITH DR Graciela Husbands   AND HOME CHECK IN  3 MONTHS  You will receive a reminder letter in the mail two months in advance. If you don't receive a letter, please call our office to schedule the follow-up appointment. Your physician recommends that you continue on your current medications as directed. Please refer to the Current Medication list given to you today.

## 2012-12-24 NOTE — Assessment & Plan Note (Signed)
.  [email or web address] The patient's device was interrogated.  The information was reviewed. No changes were made in the programming.

## 2012-12-24 NOTE — Assessment & Plan Note (Signed)
Mildly volume overloaded. We will resume Lasix 3 times a week

## 2012-12-24 NOTE — Progress Notes (Signed)
Patient Care Team: Elfredia Nevins, MD as PCP - General (Internal Medicine)   HPI  Neil [name] is a 79 y.o. male is seen in followup for ischemic heart disease prior bypass surgery and is status post pacemaker implantation for complete heart block.  Last year he developed atrial fibrillation which manifested as fatigue and dizziness. He underwent cardioversion with significant improvement in exercise performance. He has intercurrently gone back into atrial fibrillation; however, both he and his wife says there has been no significant untoward effects on exercise performance.   He was seen last fall with evidence of HFrEF. Myoview demonstrating an EF of 31% without ischemia. Echo was confirming was also RV dilatation. He improved with diuresis he stopped to diuresis and is having some shortness of breath and some edema.   Past Medical History  Diagnosis Date  . Pacemaker     St Jude status post generator replacement 2007  . Complete heart block   . Hypertension   . Diabetes mellitus   . Coronary artery disease     status post CABG1999  . Atrial fibrillation   . Dyslipidemia   . Renal artery stenosis     95%  left, 50% right  . Osteoarthritis     Past Surgical History  Procedure Laterality Date  . Pacemaker insertion    . Coronary artery bypass graft      with stenting    Current Outpatient Prescriptions  Medication Sig Dispense Refill  . atorvastatin (LIPITOR) 40 MG tablet Take 40 mg by mouth daily.        . carvedilol (COREG) 6.25 MG tablet Take 1 tablet (6.25 mg total) by mouth 2 (two) times daily.  180 tablet  3  . glipiZIDE (GLUCOTROL) 5 MG tablet Take 5 mg by mouth 2 (two) times daily before a meal.        . olmesartan (BENICAR) 20 MG tablet Take 1 tablet (20 mg total) by mouth daily.  30 tablet  11  . PRADAXA 150 MG CAPS TAKE ONE CAPSULE TWICE DAILY  60 capsule  6  . furosemide (LASIX) 20 MG tablet Take 1 tablet (20 mg total) by mouth 2 (two) times a week.  30  tablet  3   No current facility-administered medications for this visit.    Allergies  Allergen Reactions  . Penicillins     Review of Systems negative except from HPI and PMH  Physical Exam BP 146/78  Pulse 70  Ht 5\' 10"  (1.778 m)  Wt 191 lb (86.637 kg)  BMI 27.41 kg/m2 Well developed and nourished in no acute distress HENT normal Neck supple with JVP-flat Clear Regular rate and rhythm, no murmurs or gallops Abd-soft with active BS No Clubbing cyanosis 1-2=edema Skin-warm and dry A & Oriented  Grossly normal sensory and motor function   ECG demonstrates ventricular pacing with occasional PVCs in underlying atrial fibrillation  Assessment and  Plan

## 2012-12-24 NOTE — Assessment & Plan Note (Signed)
Lasix should help in this down a little bit we'll follow

## 2012-12-24 NOTE — Assessment & Plan Note (Signed)
Stable without chest pain 

## 2013-03-27 ENCOUNTER — Encounter: Payer: Self-pay | Admitting: Internal Medicine

## 2013-03-27 DIAGNOSIS — I495 Sick sinus syndrome: Secondary | ICD-10-CM | POA: Diagnosis not present

## 2013-05-09 DIAGNOSIS — Z23 Encounter for immunization: Secondary | ICD-10-CM | POA: Diagnosis not present

## 2013-06-30 ENCOUNTER — Encounter: Payer: Self-pay | Admitting: Internal Medicine

## 2013-06-30 DIAGNOSIS — I495 Sick sinus syndrome: Secondary | ICD-10-CM | POA: Diagnosis not present

## 2013-09-16 DIAGNOSIS — I2510 Atherosclerotic heart disease of native coronary artery without angina pectoris: Secondary | ICD-10-CM | POA: Diagnosis not present

## 2013-09-16 DIAGNOSIS — Z79899 Other long term (current) drug therapy: Secondary | ICD-10-CM | POA: Diagnosis not present

## 2013-09-16 DIAGNOSIS — Z125 Encounter for screening for malignant neoplasm of prostate: Secondary | ICD-10-CM | POA: Diagnosis not present

## 2013-09-16 DIAGNOSIS — E1139 Type 2 diabetes mellitus with other diabetic ophthalmic complication: Secondary | ICD-10-CM | POA: Diagnosis not present

## 2013-09-16 DIAGNOSIS — E119 Type 2 diabetes mellitus without complications: Secondary | ICD-10-CM | POA: Diagnosis not present

## 2013-09-16 DIAGNOSIS — E1165 Type 2 diabetes mellitus with hyperglycemia: Secondary | ICD-10-CM | POA: Diagnosis not present

## 2013-09-16 DIAGNOSIS — Z681 Body mass index (BMI) 19 or less, adult: Secondary | ICD-10-CM | POA: Diagnosis not present

## 2013-09-16 DIAGNOSIS — Z0000 Encounter for general adult medical examination without abnormal findings: Secondary | ICD-10-CM | POA: Diagnosis not present

## 2013-09-17 DIAGNOSIS — H52229 Regular astigmatism, unspecified eye: Secondary | ICD-10-CM | POA: Diagnosis not present

## 2013-09-17 DIAGNOSIS — H524 Presbyopia: Secondary | ICD-10-CM | POA: Diagnosis not present

## 2013-09-17 DIAGNOSIS — Z0100 Encounter for examination of eyes and vision without abnormal findings: Secondary | ICD-10-CM | POA: Diagnosis not present

## 2013-09-17 DIAGNOSIS — E1139 Type 2 diabetes mellitus with other diabetic ophthalmic complication: Secondary | ICD-10-CM | POA: Diagnosis not present

## 2013-09-22 ENCOUNTER — Other Ambulatory Visit: Payer: Self-pay | Admitting: Internal Medicine

## 2013-09-29 ENCOUNTER — Encounter: Payer: Self-pay | Admitting: Internal Medicine

## 2013-09-29 DIAGNOSIS — I495 Sick sinus syndrome: Secondary | ICD-10-CM | POA: Diagnosis not present

## 2013-12-24 ENCOUNTER — Ambulatory Visit (INDEPENDENT_AMBULATORY_CARE_PROVIDER_SITE_OTHER): Payer: Medicare Other | Admitting: Internal Medicine

## 2013-12-24 ENCOUNTER — Encounter: Payer: Self-pay | Admitting: Internal Medicine

## 2013-12-24 VITALS — BP 123/70 | HR 70 | Ht 70.5 in | Wt 182.0 lb

## 2013-12-24 DIAGNOSIS — Z950 Presence of cardiac pacemaker: Secondary | ICD-10-CM

## 2013-12-24 DIAGNOSIS — I2589 Other forms of chronic ischemic heart disease: Secondary | ICD-10-CM

## 2013-12-24 DIAGNOSIS — I255 Ischemic cardiomyopathy: Secondary | ICD-10-CM

## 2013-12-24 DIAGNOSIS — I5022 Chronic systolic (congestive) heart failure: Secondary | ICD-10-CM | POA: Diagnosis not present

## 2013-12-24 DIAGNOSIS — I442 Atrioventricular block, complete: Secondary | ICD-10-CM

## 2013-12-24 DIAGNOSIS — I4891 Unspecified atrial fibrillation: Secondary | ICD-10-CM

## 2013-12-24 LAB — MDC_IDC_ENUM_SESS_TYPE_INCLINIC
Battery Voltage: 2.76 V
Date Time Interrogation Session: 20150617144241
Lead Channel Impedance Value: 608 Ohm
Lead Channel Pacing Threshold Amplitude: 0.75 V
Lead Channel Pacing Threshold Pulse Width: 0.5 ms
MDC IDC MSMT BATTERY IMPEDANCE: 1800 Ohm
MDC IDC PG SERIAL: 2057568
MDC IDC SET LEADCHNL RV PACING AMPLITUDE: 2.5 V
MDC IDC SET LEADCHNL RV PACING PULSEWIDTH: 0.5 ms
MDC IDC SET LEADCHNL RV SENSING SENSITIVITY: 1.5 mV

## 2013-12-24 LAB — BASIC METABOLIC PANEL
BUN: 22 mg/dL (ref 6–23)
CHLORIDE: 104 meq/L (ref 96–112)
CO2: 29 meq/L (ref 19–32)
Calcium: 9.5 mg/dL (ref 8.4–10.5)
Creatinine, Ser: 1.5 mg/dL (ref 0.4–1.5)
GFR: 48.6 mL/min — ABNORMAL LOW (ref >60.00)
GLUCOSE: 175 mg/dL — AB (ref 70–99)
POTASSIUM: 4.7 meq/L (ref 3.5–5.1)
Sodium: 138 mEq/L (ref 135–145)

## 2013-12-24 NOTE — Patient Instructions (Addendum)
Lab today: BMET  Your physician recommends that you continue on your current medications as directed. Please refer to the Current Medication list given to you today.  Your physician wants you to follow-up in: 1 year with Dr. Caryl Comes.  You will receive a reminder letter in the mail two months in advance. If you don't receive a letter, please call our office to schedule the follow-up appointment.

## 2013-12-24 NOTE — Progress Notes (Signed)
Patient Care Team: Redmond School, MD as PCP - General (Internal Medicine)   HPI  Neil [name] is a 80 y.o. male is seen in followup for ischemic heart disease prior bypass surgery and is status post pacemaker implantation for complete heart block.   He has had  atrial fibrillation which manifested as fatigue and dizziness. He underwent cardioversion with significant improvement in exercise performance. He has intercurrently gone back into atrial fibrillation; however, both he and his wife says there has been no significant untoward effects on exercise performance. He has chronic DOE   2013 Myoview demonstrating an EF of 31% without ischemia. Echo was confirming >>  also RV dilatation.    Past Medical History  Diagnosis Date  . Pacemaker     St Jude status post generator replacement 2007  . Complete heart block   . Hypertension   . Diabetes mellitus   . Coronary artery disease     status post CABG1999  . Atrial fibrillation   . Dyslipidemia   . Renal artery stenosis     95%  left, 50% right  . Osteoarthritis     Past Surgical History  Procedure Laterality Date  . Pacemaker insertion    . Coronary artery bypass graft      with stenting    Current Outpatient Prescriptions  Medication Sig Dispense Refill  . atorvastatin (LIPITOR) 40 MG tablet Take 40 mg by mouth daily.        . Canagliflozin (INVOKANA) 100 MG TABS Take 1 tablet by mouth daily.      . carvedilol (COREG) 6.25 MG tablet TAKE ONE TABLET BY MOUTH TWICE A DAY  180 tablet  0  . furosemide (LASIX) 20 MG tablet Take 20 mg by mouth 2 (two) times daily.      Marland Kitchen glipiZIDE (GLUCOTROL) 5 MG tablet Take 5 mg by mouth 2 (two) times daily before a meal.        . olmesartan (BENICAR) 20 MG tablet Take 1 tablet (20 mg total) by mouth daily.  30 tablet  11  . PRADAXA 150 MG CAPS TAKE ONE CAPSULE TWICE DAILY  60 capsule  6   No current facility-administered medications for this visit.    Allergies  Allergen Reactions  .  Penicillins     Review of Systems negative except from HPI and PMH  Physical Exam BP 123/70  Pulse 70  Ht 5' 10.5" (1.791 m)  Wt 182 lb (82.555 kg)  BMI 25.74 kg/m2 Well developed and nourished in no acute distress HENT normal Neck supple with JVP-flat Clear Regular rate and rhythm, no murmurs or gallops Abd-soft with active BS No Clubbing cyanosis 1-2=edema Skin-warm and dry A & Oriented  Grossly normal sensory and motor function   ECG demonstrates ventricular pacing with occasional PVCs in underlying atrial fibrillation  Assessment and  Plan  Atrial fib  Ischemic cardiomyopathy  Coronary artery disease  Congestive heart failure-chronic systolic  Complete heart block   Pacemaker-St. Jude  The patient's device was interrogated.  The information was reviewed. No changes were made in the programming.   Without symptoms of ischemia  On anticoagulation. We'll check a metabolic profile to ascertain the dose of Pradaxa  Euvolemic continue current meds

## 2013-12-31 ENCOUNTER — Encounter: Payer: Self-pay | Admitting: Internal Medicine

## 2014-01-26 ENCOUNTER — Other Ambulatory Visit: Payer: Self-pay | Admitting: Internal Medicine

## 2014-01-27 NOTE — Telephone Encounter (Signed)
Deboraha Sprang, MD at 12/24/2013 10:54 AM carvedilol (COREG) 6.25 MG tablet  TAKE ONE TABLET BY MOUTH TWICE A DAY   Your physician recommends that you continue on your current medications as directed. Please refer to the Current Medication list given to you today.

## 2014-03-30 ENCOUNTER — Encounter: Payer: Self-pay | Admitting: Internal Medicine

## 2014-03-30 DIAGNOSIS — I495 Sick sinus syndrome: Secondary | ICD-10-CM

## 2014-03-31 DIAGNOSIS — Z23 Encounter for immunization: Secondary | ICD-10-CM | POA: Diagnosis not present

## 2014-05-06 DIAGNOSIS — E119 Type 2 diabetes mellitus without complications: Secondary | ICD-10-CM | POA: Diagnosis not present

## 2014-05-06 DIAGNOSIS — N342 Other urethritis: Secondary | ICD-10-CM | POA: Diagnosis not present

## 2014-05-06 DIAGNOSIS — I2510 Atherosclerotic heart disease of native coronary artery without angina pectoris: Secondary | ICD-10-CM | POA: Diagnosis not present

## 2014-05-06 DIAGNOSIS — I10 Essential (primary) hypertension: Secondary | ICD-10-CM | POA: Diagnosis not present

## 2014-05-18 DIAGNOSIS — N342 Other urethritis: Secondary | ICD-10-CM | POA: Diagnosis not present

## 2014-06-29 ENCOUNTER — Encounter: Payer: Self-pay | Admitting: Internal Medicine

## 2014-06-29 DIAGNOSIS — R001 Bradycardia, unspecified: Secondary | ICD-10-CM

## 2014-09-28 DIAGNOSIS — R001 Bradycardia, unspecified: Secondary | ICD-10-CM | POA: Diagnosis not present

## 2014-10-01 DIAGNOSIS — N400 Enlarged prostate without lower urinary tract symptoms: Secondary | ICD-10-CM | POA: Diagnosis not present

## 2014-10-01 DIAGNOSIS — Z0001 Encounter for general adult medical examination with abnormal findings: Secondary | ICD-10-CM | POA: Diagnosis not present

## 2014-10-01 DIAGNOSIS — I2510 Atherosclerotic heart disease of native coronary artery without angina pectoris: Secondary | ICD-10-CM | POA: Diagnosis not present

## 2014-10-01 DIAGNOSIS — Z125 Encounter for screening for malignant neoplasm of prostate: Secondary | ICD-10-CM | POA: Diagnosis not present

## 2014-10-01 DIAGNOSIS — L570 Actinic keratosis: Secondary | ICD-10-CM | POA: Diagnosis not present

## 2014-10-01 DIAGNOSIS — I10 Essential (primary) hypertension: Secondary | ICD-10-CM | POA: Diagnosis not present

## 2014-10-01 DIAGNOSIS — E6609 Other obesity due to excess calories: Secondary | ICD-10-CM | POA: Diagnosis not present

## 2014-10-01 DIAGNOSIS — Z6830 Body mass index (BMI) 30.0-30.9, adult: Secondary | ICD-10-CM | POA: Diagnosis not present

## 2014-10-01 DIAGNOSIS — E782 Mixed hyperlipidemia: Secondary | ICD-10-CM | POA: Diagnosis not present

## 2014-10-01 DIAGNOSIS — E119 Type 2 diabetes mellitus without complications: Secondary | ICD-10-CM | POA: Diagnosis not present

## 2014-10-29 DIAGNOSIS — N2581 Secondary hyperparathyroidism of renal origin: Secondary | ICD-10-CM | POA: Diagnosis not present

## 2014-10-29 DIAGNOSIS — E1122 Type 2 diabetes mellitus with diabetic chronic kidney disease: Secondary | ICD-10-CM | POA: Diagnosis not present

## 2014-10-29 DIAGNOSIS — I10 Essential (primary) hypertension: Secondary | ICD-10-CM | POA: Diagnosis not present

## 2014-10-29 DIAGNOSIS — D631 Anemia in chronic kidney disease: Secondary | ICD-10-CM | POA: Diagnosis not present

## 2014-10-29 DIAGNOSIS — N183 Chronic kidney disease, stage 3 (moderate): Secondary | ICD-10-CM | POA: Diagnosis not present

## 2014-10-30 DIAGNOSIS — N183 Chronic kidney disease, stage 3 (moderate): Secondary | ICD-10-CM | POA: Diagnosis not present

## 2014-10-30 DIAGNOSIS — D631 Anemia in chronic kidney disease: Secondary | ICD-10-CM | POA: Diagnosis not present

## 2014-10-30 DIAGNOSIS — N2581 Secondary hyperparathyroidism of renal origin: Secondary | ICD-10-CM | POA: Diagnosis not present

## 2014-10-30 DIAGNOSIS — E1122 Type 2 diabetes mellitus with diabetic chronic kidney disease: Secondary | ICD-10-CM | POA: Diagnosis not present

## 2014-10-30 DIAGNOSIS — I10 Essential (primary) hypertension: Secondary | ICD-10-CM | POA: Diagnosis not present

## 2014-11-02 ENCOUNTER — Other Ambulatory Visit: Payer: Self-pay | Admitting: Nephrology

## 2014-11-02 DIAGNOSIS — N183 Chronic kidney disease, stage 3 unspecified: Secondary | ICD-10-CM

## 2014-11-09 ENCOUNTER — Ambulatory Visit
Admission: RE | Admit: 2014-11-09 | Discharge: 2014-11-09 | Disposition: A | Discharge status: Home / Self Care | Payer: Medicare Other | Source: Ambulatory Visit | Attending: Nephrology | Admitting: Nephrology

## 2014-11-09 DIAGNOSIS — N183 Chronic kidney disease, stage 3 unspecified: Secondary | ICD-10-CM

## 2014-11-09 DIAGNOSIS — N261 Atrophy of kidney (terminal): Secondary | ICD-10-CM | POA: Diagnosis not present

## 2014-11-09 DIAGNOSIS — N281 Cyst of kidney, acquired: Secondary | ICD-10-CM | POA: Diagnosis not present

## 2014-11-09 HISTORY — DX: Chronic kidney disease, stage 3 (moderate): N18.3

## 2014-11-09 HISTORY — DX: Chronic kidney disease, stage 3 unspecified: N18.30

## 2014-11-10 ENCOUNTER — Encounter: Payer: Self-pay | Admitting: Internal Medicine

## 2014-11-26 DIAGNOSIS — N2581 Secondary hyperparathyroidism of renal origin: Secondary | ICD-10-CM | POA: Diagnosis not present

## 2014-11-26 DIAGNOSIS — I10 Essential (primary) hypertension: Secondary | ICD-10-CM | POA: Diagnosis not present

## 2014-11-26 DIAGNOSIS — N183 Chronic kidney disease, stage 3 (moderate): Secondary | ICD-10-CM | POA: Diagnosis not present

## 2014-11-26 DIAGNOSIS — D631 Anemia in chronic kidney disease: Secondary | ICD-10-CM | POA: Diagnosis not present

## 2014-11-26 DIAGNOSIS — E1122 Type 2 diabetes mellitus with diabetic chronic kidney disease: Secondary | ICD-10-CM | POA: Diagnosis not present

## 2015-01-19 ENCOUNTER — Encounter: Payer: Medicare Other | Admitting: Internal Medicine

## 2015-02-16 ENCOUNTER — Ambulatory Visit (INDEPENDENT_AMBULATORY_CARE_PROVIDER_SITE_OTHER): Payer: Medicare Other | Admitting: Internal Medicine

## 2015-02-16 ENCOUNTER — Encounter: Payer: Self-pay | Admitting: Internal Medicine

## 2015-02-16 VITALS — BP 118/60 | HR 70 | Ht 70.5 in | Wt 181.2 lb

## 2015-02-16 DIAGNOSIS — I442 Atrioventricular block, complete: Secondary | ICD-10-CM | POA: Diagnosis not present

## 2015-02-16 DIAGNOSIS — Z45018 Encounter for adjustment and management of other part of cardiac pacemaker: Secondary | ICD-10-CM | POA: Diagnosis not present

## 2015-02-16 DIAGNOSIS — I480 Paroxysmal atrial fibrillation: Secondary | ICD-10-CM

## 2015-02-16 LAB — CUP PACEART INCLINIC DEVICE CHECK
Lead Channel Pacing Threshold Amplitude: 0.75 V
Lead Channel Pacing Threshold Pulse Width: 0.5 ms
Lead Channel Setting Pacing Amplitude: 2.5 V
MDC IDC MSMT BATTERY IMPEDANCE: 2200 Ohm
MDC IDC MSMT BATTERY VOLTAGE: 2.75 V
MDC IDC MSMT LEADCHNL RV IMPEDANCE VALUE: 612 Ohm
MDC IDC SESS DTM: 20160809154801
MDC IDC SET LEADCHNL RV PACING PULSEWIDTH: 0.5 ms
MDC IDC SET LEADCHNL RV SENSING SENSITIVITY: 1.5 mV
MDC IDC STAT BRADY RV PERCENT PACED: 99 %
Pulse Gen Model: 5826
Pulse Gen Serial Number: 2057568

## 2015-02-16 NOTE — Patient Instructions (Addendum)
Medication Instructions:  Your physician recommends that you continue on your current medications as directed. Please refer to the Current Medication list given to you today.  Labwork: None ordered  Testing/Procedures: None ordered  Follow-Up: Your physician wants you to follow-up in: 1 year with Amber Seiler, NP .  You will receive a reminder letter in the mail two months in advance. If you don't receive a letter, please call our office to schedule the follow-up appointment.  Any Other Special Instructions Will Be Listed Below (If Applicable). Thank you for choosing [facility]!!         

## 2015-02-16 NOTE — Progress Notes (Signed)
Electrophysiology Office Note Date: 02/16/2015  ID:  Neil [name], DOB Oct 04, 1928, MRN [id number]  PCP: Glo Herring., MD Electrophysiologist: Caryl Comes  CC: Pacemaker follow-up  Neil [name] is a 81 y.o. male who presents today for routine electrophysiology followup.  Since last being seen in our clinic, the patient reports doing very well. 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  He denies chest pain, palpitations, dyspnea, PND, orthopnea, nausea, vomiting, dizziness, syncope, edema, weight gain, or early satiety.   Device History: STJ dual chamber PPM implanted 1998 for complete heart block; gen change 2009   Past Medical History  Diagnosis Date  . Pacemaker     St Jude status post generator replacement 2007  . Complete heart block   . Hypertension   . Diabetes mellitus   . Coronary artery disease     status post CABG1999  . Atrial fibrillation   . Dyslipidemia   . Renal artery stenosis     95%  left, 50% right  . Osteoarthritis   . Chronic kidney disease (CKD), stage III (moderate)    Past Surgical History  Procedure Laterality Date  . Pacemaker insertion    . Coronary artery bypass graft      with stenting    Current Outpatient Prescriptions  Medication Sig Dispense Refill  . atorvastatin (LIPITOR) 40 MG tablet Take 40 mg by mouth daily.      . carvedilol (COREG) 6.25 MG tablet Take 6.25 mg by mouth 2 (two) times daily with a meal.    . dabigatran (PRADAXA) 150 MG CAPS capsule Take 150 mg by mouth 2 (two) times daily.    . furosemide (LASIX) 20 MG tablet Take 20 mg by mouth daily.     Marland Kitchen glipiZIDE (GLUCOTROL) 5 MG tablet Take 10 mg by mouth 2 (two) times daily before a meal.     . olmesartan (BENICAR) 20 MG tablet Take 1 tablet (20 mg total) by mouth daily. 30 tablet 11   No current facility-administered medications for this visit.    Allergies:   Penicillins   Social History: History   Social History   . Marital Status: Married    Spouse Name: N/A  . Number of Children: N/A  . Years of Education: N/A   Occupational History  . Not on file.   Social History Main Topics  . Smoking status: Never Smoker   . Smokeless tobacco: Never Used  . Alcohol Use: No  . Drug Use: No  . Sexual Activity: Not on file   Other Topics Concern  . Not on file   Social History Narrative    Family History: Family History  Problem Relation Age of Onset  . Other Mother   . Cancer Mother     breast  . Alzheimer's disease Sister      Review of Systems: All other systems reviewed and are otherwise negative except as noted above.   Physical Exam: VS:  BP 118/60 mmHg  Pulse 70  Ht 5' 10.5" (1.791 m)  Wt 181 lb 3.2 oz (82.192 kg)  BMI 25.62 kg/m2 , BMI Body mass index is 25.62 kg/(m^2).  GEN- The patient is elderly appearing, alert and oriented x 3 today.   HEENT: normocephalic, atraumatic; sclera clear, conjunctiva pink; hearing intact; oropharynx clear; neck supple  Lungs- Clear to ausculation bilaterally, normal work of breathing.  No wheezes, rales, rhonchi Heart- Regular rate and rhythm (paced) GI- soft, non-tender, non-distended,  bowel sounds present  Extremities- no clubbing, cyanosis, or edema  MS- no significant deformity or atrophy Skin- warm and dry, no rash or lesion; PPM pocket well healed Psych- euthymic mood, full affect Neuro- strength and sensation are intact  PPM Interrogation- reviewed in detail today,  See PACEART report  EKG:  EKG is ordered today. The ekg ordered today shows atrial fibrillation with V pacing  Recent Labs: No results found for requested labs within last 365 days.   Wt Readings from Last 3 Encounters:  02/16/15 181 lb 3.2 oz (82.192 kg)  12/24/13 182 lb (82.555 kg)  12/24/12 191 lb (86.637 kg)     Other studies Reviewed: Additional studies/ records that were reviewed today include: Dr Olin Pia office notes  Assessment and Plan:  1.   Complete heart block Normal PPM function - pt is pacemaker dependent today See Pace Art report No changes today  2.  Permanent atrial fibrillation Continue Pradaxa for CHADS2VASC of at least 5 Recent CBC and BMET stable at PCP's physical in December of last year   Current medicines are reviewed at length with the patient today.   The patient does not have concerns regarding his medicines.  The following changes were made today:  none  Labs/ tests ordered today include:  Orders Placed This Encounter  Procedures  . EKG 12-Lead     Disposition:   Follow up with Chanetta Marshall, NP in 1 year, [employer] transmissions every 3 months    Signed, Virl Axe, MD  02/16/2015 3:42 PM  [address] [place] [address] [phone] (office) [phone] (fax)

## 2015-02-22 ENCOUNTER — Encounter: Payer: Self-pay | Admitting: Internal Medicine

## 2015-02-25 DIAGNOSIS — I10 Essential (primary) hypertension: Secondary | ICD-10-CM | POA: Diagnosis not present

## 2015-02-25 DIAGNOSIS — D631 Anemia in chronic kidney disease: Secondary | ICD-10-CM | POA: Diagnosis not present

## 2015-02-25 DIAGNOSIS — N2581 Secondary hyperparathyroidism of renal origin: Secondary | ICD-10-CM | POA: Diagnosis not present

## 2015-02-25 DIAGNOSIS — E1122 Type 2 diabetes mellitus with diabetic chronic kidney disease: Secondary | ICD-10-CM | POA: Diagnosis not present

## 2015-02-25 DIAGNOSIS — N183 Chronic kidney disease, stage 3 (moderate): Secondary | ICD-10-CM | POA: Diagnosis not present

## 2015-05-14 DIAGNOSIS — Z23 Encounter for immunization: Secondary | ICD-10-CM | POA: Diagnosis not present

## 2015-05-19 DIAGNOSIS — R001 Bradycardia, unspecified: Secondary | ICD-10-CM | POA: Diagnosis not present

## 2015-05-19 DIAGNOSIS — I442 Atrioventricular block, complete: Secondary | ICD-10-CM | POA: Diagnosis not present

## 2015-05-27 DIAGNOSIS — N183 Chronic kidney disease, stage 3 (moderate): Secondary | ICD-10-CM | POA: Diagnosis not present

## 2015-05-27 DIAGNOSIS — E1122 Type 2 diabetes mellitus with diabetic chronic kidney disease: Secondary | ICD-10-CM | POA: Diagnosis not present

## 2015-05-27 DIAGNOSIS — N2581 Secondary hyperparathyroidism of renal origin: Secondary | ICD-10-CM | POA: Diagnosis not present

## 2015-05-27 DIAGNOSIS — R809 Proteinuria, unspecified: Secondary | ICD-10-CM | POA: Diagnosis not present

## 2015-05-27 DIAGNOSIS — I129 Hypertensive chronic kidney disease with stage 1 through stage 4 chronic kidney disease, or unspecified chronic kidney disease: Secondary | ICD-10-CM | POA: Diagnosis not present

## 2015-05-27 DIAGNOSIS — I10 Essential (primary) hypertension: Secondary | ICD-10-CM | POA: Diagnosis not present

## 2015-05-27 DIAGNOSIS — D631 Anemia in chronic kidney disease: Secondary | ICD-10-CM | POA: Diagnosis not present

## 2015-05-27 DIAGNOSIS — E559 Vitamin D deficiency, unspecified: Secondary | ICD-10-CM | POA: Diagnosis not present

## 2015-08-18 DIAGNOSIS — I495 Sick sinus syndrome: Secondary | ICD-10-CM | POA: Diagnosis not present

## 2015-08-18 DIAGNOSIS — R001 Bradycardia, unspecified: Secondary | ICD-10-CM | POA: Diagnosis not present

## 2015-09-23 DIAGNOSIS — E559 Vitamin D deficiency, unspecified: Secondary | ICD-10-CM | POA: Diagnosis not present

## 2015-09-23 DIAGNOSIS — I129 Hypertensive chronic kidney disease with stage 1 through stage 4 chronic kidney disease, or unspecified chronic kidney disease: Secondary | ICD-10-CM | POA: Diagnosis not present

## 2015-09-23 DIAGNOSIS — N2581 Secondary hyperparathyroidism of renal origin: Secondary | ICD-10-CM | POA: Diagnosis not present

## 2015-09-23 DIAGNOSIS — E1122 Type 2 diabetes mellitus with diabetic chronic kidney disease: Secondary | ICD-10-CM | POA: Diagnosis not present

## 2015-09-23 DIAGNOSIS — N183 Chronic kidney disease, stage 3 (moderate): Secondary | ICD-10-CM | POA: Diagnosis not present

## 2015-10-04 DIAGNOSIS — D0339 Melanoma in situ of other parts of face: Secondary | ICD-10-CM | POA: Diagnosis not present

## 2015-10-04 DIAGNOSIS — L259 Unspecified contact dermatitis, unspecified cause: Secondary | ICD-10-CM | POA: Diagnosis not present

## 2015-10-04 DIAGNOSIS — D0359 Melanoma in situ of other part of trunk: Secondary | ICD-10-CM | POA: Diagnosis not present

## 2015-10-05 DIAGNOSIS — Z6830 Body mass index (BMI) 30.0-30.9, adult: Secondary | ICD-10-CM | POA: Diagnosis not present

## 2015-10-05 DIAGNOSIS — Z1389 Encounter for screening for other disorder: Secondary | ICD-10-CM | POA: Diagnosis not present

## 2015-10-05 DIAGNOSIS — E119 Type 2 diabetes mellitus without complications: Secondary | ICD-10-CM | POA: Diagnosis not present

## 2015-10-05 DIAGNOSIS — Z0000 Encounter for general adult medical examination without abnormal findings: Secondary | ICD-10-CM | POA: Diagnosis not present

## 2015-10-05 DIAGNOSIS — E1129 Type 2 diabetes mellitus with other diabetic kidney complication: Secondary | ICD-10-CM | POA: Diagnosis not present

## 2015-10-11 DIAGNOSIS — D0359 Melanoma in situ of other part of trunk: Secondary | ICD-10-CM | POA: Diagnosis not present

## 2015-10-11 DIAGNOSIS — L98429 Non-pressure chronic ulcer of back with unspecified severity: Secondary | ICD-10-CM | POA: Diagnosis not present

## 2015-10-11 DIAGNOSIS — X32XXXD Exposure to sunlight, subsequent encounter: Secondary | ICD-10-CM | POA: Diagnosis not present

## 2015-10-11 DIAGNOSIS — L570 Actinic keratosis: Secondary | ICD-10-CM | POA: Diagnosis not present

## 2015-10-12 DIAGNOSIS — E6609 Other obesity due to excess calories: Secondary | ICD-10-CM | POA: Diagnosis not present

## 2015-10-12 DIAGNOSIS — E1129 Type 2 diabetes mellitus with other diabetic kidney complication: Secondary | ICD-10-CM | POA: Diagnosis not present

## 2015-10-12 DIAGNOSIS — E782 Mixed hyperlipidemia: Secondary | ICD-10-CM | POA: Diagnosis not present

## 2015-10-12 DIAGNOSIS — E039 Hypothyroidism, unspecified: Secondary | ICD-10-CM | POA: Diagnosis not present

## 2015-10-12 DIAGNOSIS — I10 Essential (primary) hypertension: Secondary | ICD-10-CM | POA: Diagnosis not present

## 2015-10-12 DIAGNOSIS — Z125 Encounter for screening for malignant neoplasm of prostate: Secondary | ICD-10-CM | POA: Diagnosis not present

## 2015-10-12 DIAGNOSIS — R201 Hypoesthesia of skin: Secondary | ICD-10-CM | POA: Diagnosis not present

## 2015-10-12 DIAGNOSIS — Z6830 Body mass index (BMI) 30.0-30.9, adult: Secondary | ICD-10-CM | POA: Diagnosis not present

## 2015-10-12 DIAGNOSIS — N400 Enlarged prostate without lower urinary tract symptoms: Secondary | ICD-10-CM | POA: Diagnosis not present

## 2015-10-12 DIAGNOSIS — Z1389 Encounter for screening for other disorder: Secondary | ICD-10-CM | POA: Diagnosis not present

## 2015-11-15 DIAGNOSIS — D0339 Melanoma in situ of other parts of face: Secondary | ICD-10-CM | POA: Diagnosis not present

## 2015-11-15 DIAGNOSIS — D485 Neoplasm of uncertain behavior of skin: Secondary | ICD-10-CM | POA: Diagnosis not present

## 2015-11-24 ENCOUNTER — Encounter: Payer: Self-pay | Admitting: Internal Medicine

## 2015-11-24 DIAGNOSIS — I495 Sick sinus syndrome: Secondary | ICD-10-CM | POA: Diagnosis not present

## 2015-11-24 DIAGNOSIS — R001 Bradycardia, unspecified: Secondary | ICD-10-CM | POA: Diagnosis not present

## 2016-01-26 DIAGNOSIS — E1129 Type 2 diabetes mellitus with other diabetic kidney complication: Secondary | ICD-10-CM | POA: Diagnosis not present

## 2016-01-26 DIAGNOSIS — E559 Vitamin D deficiency, unspecified: Secondary | ICD-10-CM | POA: Diagnosis not present

## 2016-01-26 DIAGNOSIS — N183 Chronic kidney disease, stage 3 (moderate): Secondary | ICD-10-CM | POA: Diagnosis not present

## 2016-01-26 DIAGNOSIS — I10 Essential (primary) hypertension: Secondary | ICD-10-CM | POA: Diagnosis not present

## 2016-01-26 DIAGNOSIS — N2581 Secondary hyperparathyroidism of renal origin: Secondary | ICD-10-CM | POA: Diagnosis not present

## 2016-02-10 DIAGNOSIS — L570 Actinic keratosis: Secondary | ICD-10-CM | POA: Diagnosis not present

## 2016-02-10 DIAGNOSIS — S80862A Insect bite (nonvenomous), left lower leg, initial encounter: Secondary | ICD-10-CM | POA: Diagnosis not present

## 2016-02-10 DIAGNOSIS — S80861A Insect bite (nonvenomous), right lower leg, initial encounter: Secondary | ICD-10-CM | POA: Diagnosis not present

## 2016-02-10 DIAGNOSIS — Z08 Encounter for follow-up examination after completed treatment for malignant neoplasm: Secondary | ICD-10-CM | POA: Diagnosis not present

## 2016-02-10 DIAGNOSIS — X32XXXD Exposure to sunlight, subsequent encounter: Secondary | ICD-10-CM | POA: Diagnosis not present

## 2016-02-10 DIAGNOSIS — Z85820 Personal history of malignant melanoma of skin: Secondary | ICD-10-CM | POA: Diagnosis not present

## 2016-02-10 DIAGNOSIS — Z1283 Encounter for screening for malignant neoplasm of skin: Secondary | ICD-10-CM | POA: Diagnosis not present

## 2016-02-10 DIAGNOSIS — D0439 Carcinoma in situ of skin of other parts of face: Secondary | ICD-10-CM | POA: Diagnosis not present

## 2016-02-15 NOTE — Progress Notes (Signed)
Electrophysiology Office Note Date: 02/16/2016  ID:  Neil [name], DOB 1928/12/04, MRN [id number]  PCP: Glo Herring., MD Electrophysiologist: Caryl Comes  CC: Pacemaker follow-up  Neil [name] is a 82 y.o. male who presents today for routine electrophysiology followup.  Since last being seen in our clinic, the patient reports doing very well. 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  He denies chest pain, palpitations, dyspnea, PND, orthopnea, nausea, vomiting, dizziness, syncope, edema, weight gain, or early satiety.   Device History: STJ dual chamber PPM implanted 1998 for complete heart block; gen change 2009   Past Medical History:  Diagnosis Date  . Atrial fibrillation ([place])   . Chronic kidney disease (CKD), stage III (moderate)   . Complete heart block ([place])   . Coronary artery disease    status post CABG1999  . Diabetes mellitus   . Dyslipidemia   . Hypertension   . Osteoarthritis   . Pacemaker    St Jude status post generator replacement 2007  . Renal artery stenosis ([facility])    95%  left, 50% right   Past Surgical History:  Procedure Laterality Date  . CORONARY ARTERY BYPASS GRAFT     with stenting  . PACEMAKER INSERTION      Current Outpatient Prescriptions  Medication Sig Dispense Refill  . atorvastatin (LIPITOR) 40 MG tablet Take 40 mg by mouth daily.      . carvedilol (COREG) 6.25 MG tablet Take 6.25 mg by mouth 2 (two) times daily with a meal.    . dabigatran (PRADAXA) 150 MG CAPS capsule Take 1 capsule (150 mg total) by mouth 2 (two) times daily. 180 capsule 4  . furosemide (LASIX) 20 MG tablet Take 20 mg by mouth daily.     Marland Kitchen glipiZIDE (GLUCOTROL) 5 MG tablet Take 10 mg by mouth 2 (two) times daily before a meal.     . olmesartan (BENICAR) 20 MG tablet Take 1 tablet (20 mg total) by mouth daily. 30 tablet 11   No current facility-administered medications for this visit.     Allergies:   Penicillins     Social History: Social History   Social History  . Marital status: Married    Spouse name: N/A  . Number of children: N/A  . Years of education: N/A   Occupational History  . Not on file.   Social History Main Topics  . Smoking status: Never Smoker  . Smokeless tobacco: Never Used  . Alcohol use No  . Drug use: No  . Sexual activity: Not on file   Other Topics Concern  . Not on file   Social History Narrative  . No narrative on file    Family History: Family History  Problem Relation Age of Onset  . Other Mother   . Cancer Mother     breast  . Alzheimer's disease Sister      Review of Systems: All other systems reviewed and are otherwise negative except as noted above.   Physical Exam: VS:  BP 128/70   Pulse 68   Ht 5\' 10"  (1.778 m)   Wt 179 lb 6.4 oz (81.4 kg)   BMI 25.74 kg/m  , BMI Body mass index is 25.74 kg/m.  GEN- The patient is elderly appearing, alert and oriented x 3 today.   HEENT: normocephalic, atraumatic; sclera clear, conjunctiva pink; hearing intact; oropharynx clear; neck supple  Lungs- Clear to ausculation bilaterally, normal work of breathing.  No wheezes, rales, rhonchi Heart- Regular rate and rhythm (paced) GI- soft, non-tender, non-distended, bowel sounds present  Extremities- no clubbing, cyanosis, or edema  MS- no significant deformity or atrophy Skin- warm and dry, no rash or lesion; PPM pocket well healed Psych- euthymic mood, full affect Neuro- strength and sensation are intact  PPM Interrogation- reviewed in detail today,  See PACEART report  EKG:  EKG is not ordered today.  Recent Labs: No results found for requested labs within last 8760 hours.   Wt Readings from Last 3 Encounters:  02/16/16 179 lb 6.4 oz (81.4 kg)  02/16/15 181 lb 3.2 oz (82.2 kg)  12/24/13 182 lb (82.6 kg)     Other studies Reviewed: Additional studies/ records that were reviewed today include: Dr Olin Pia office notes  Assessment and  Plan:  1.  Complete heart block Normal PPM function - pt is pacemaker dependent today See Pace Art report No changes today  2.  Permanent atrial fibrillation Continue Pradaxa for CHADS2VASC of at least 5 Recent CBC and BMET stable - checked by renal per patient   Current medicines are reviewed at length with the patient today.   The patient does not have concerns regarding his medicines.  The following changes were made today:  none  Labs/ tests ordered today include: none  Disposition:   Follow up with Dr Caryl Comes in 1 year, [employer] transmissions every 3 months    Signed, Chanetta Marshall, NP 02/16/2016 1:30 PM  [address] [place] [address] [phone] (office) [phone] (fax)

## 2016-02-16 ENCOUNTER — Ambulatory Visit (INDEPENDENT_AMBULATORY_CARE_PROVIDER_SITE_OTHER): Payer: Medicare Other | Admitting: Nurse Practitioner

## 2016-02-16 ENCOUNTER — Encounter: Payer: Self-pay | Admitting: Internal Medicine

## 2016-02-16 VITALS — BP 128/70 | HR 68 | Ht 70.0 in | Wt 179.4 lb

## 2016-02-16 DIAGNOSIS — I442 Atrioventricular block, complete: Secondary | ICD-10-CM

## 2016-02-16 DIAGNOSIS — I482 Chronic atrial fibrillation: Secondary | ICD-10-CM | POA: Diagnosis not present

## 2016-02-16 DIAGNOSIS — I4821 Permanent atrial fibrillation: Secondary | ICD-10-CM

## 2016-02-16 MED ORDER — DABIGATRAN ETEXILATE MESYLATE 150 MG PO CAPS: 150.0000 mg | ORAL_CAPSULE | Freq: Two times a day (BID) | ORAL | 4 refills | Status: DC

## 2016-02-16 NOTE — Patient Instructions (Addendum)
Medication Instructions:   Your physician recommends that you continue on your current medications as directed. Please refer to the Current Medication list given to you today.   If you need a refill on your cardiac medications before your next appointment, please call your pharmacy.  Labwork: NONE ORDER TODAY    Testing/Procedures: NONE ORDER TODAY    Follow-Up:   Your physician wants you to follow-up in: [place]. You will receive a reminder letter in the mail two months in advance. If you don't receive a letter, please call our office to schedule the follow-up appointment.     Any Other Special Instructions Will Be Listed Below (If Applicable).  YOU HAVE BEEN RECOMMENDED TO GET AN CBC LAB WORK DRAWN FROM YOUR OTHER MEDICAL PROVIDER

## 2016-02-17 LAB — CUP PACEART INCLINIC DEVICE CHECK
Date Time Interrogation Session: 20170809140840
Implantable Lead Implant Date: 19980214
MDC IDC LEAD IMPLANT DT: 19980214
MDC IDC LEAD LOCATION: 753859
MDC IDC LEAD LOCATION: 753860
MDC IDC PG SERIAL: 2057568
Pulse Gen Model: 5826

## 2016-05-10 DIAGNOSIS — S29019A Strain of muscle and tendon of unspecified wall of thorax, initial encounter: Secondary | ICD-10-CM | POA: Diagnosis not present

## 2016-05-10 DIAGNOSIS — N400 Enlarged prostate without lower urinary tract symptoms: Secondary | ICD-10-CM | POA: Diagnosis not present

## 2016-05-10 DIAGNOSIS — Z23 Encounter for immunization: Secondary | ICD-10-CM | POA: Diagnosis not present

## 2016-05-10 DIAGNOSIS — E119 Type 2 diabetes mellitus without complications: Secondary | ICD-10-CM | POA: Diagnosis not present

## 2016-05-10 DIAGNOSIS — Z6829 Body mass index (BMI) 29.0-29.9, adult: Secondary | ICD-10-CM | POA: Diagnosis not present

## 2016-05-15 DIAGNOSIS — Z85820 Personal history of malignant melanoma of skin: Secondary | ICD-10-CM | POA: Diagnosis not present

## 2016-05-15 DIAGNOSIS — Z1283 Encounter for screening for malignant neoplasm of skin: Secondary | ICD-10-CM | POA: Diagnosis not present

## 2016-05-15 DIAGNOSIS — L570 Actinic keratosis: Secondary | ICD-10-CM | POA: Diagnosis not present

## 2016-05-15 DIAGNOSIS — Z08 Encounter for follow-up examination after completed treatment for malignant neoplasm: Secondary | ICD-10-CM | POA: Diagnosis not present

## 2016-05-15 DIAGNOSIS — X32XXXD Exposure to sunlight, subsequent encounter: Secondary | ICD-10-CM | POA: Diagnosis not present

## 2016-05-17 ENCOUNTER — Encounter: Payer: Self-pay | Admitting: Internal Medicine

## 2016-05-17 DIAGNOSIS — R001 Bradycardia, unspecified: Secondary | ICD-10-CM | POA: Diagnosis not present

## 2016-05-17 DIAGNOSIS — I495 Sick sinus syndrome: Secondary | ICD-10-CM | POA: Diagnosis not present

## 2016-08-02 DIAGNOSIS — E1122 Type 2 diabetes mellitus with diabetic chronic kidney disease: Secondary | ICD-10-CM | POA: Diagnosis not present

## 2016-08-02 DIAGNOSIS — N2581 Secondary hyperparathyroidism of renal origin: Secondary | ICD-10-CM | POA: Diagnosis not present

## 2016-08-02 DIAGNOSIS — I129 Hypertensive chronic kidney disease with stage 1 through stage 4 chronic kidney disease, or unspecified chronic kidney disease: Secondary | ICD-10-CM | POA: Diagnosis not present

## 2016-08-02 DIAGNOSIS — N183 Chronic kidney disease, stage 3 (moderate): Secondary | ICD-10-CM | POA: Diagnosis not present

## 2016-08-16 ENCOUNTER — Encounter: Payer: Self-pay | Admitting: Internal Medicine

## 2016-08-16 DIAGNOSIS — I495 Sick sinus syndrome: Secondary | ICD-10-CM | POA: Diagnosis not present

## 2016-08-16 DIAGNOSIS — R001 Bradycardia, unspecified: Secondary | ICD-10-CM | POA: Diagnosis not present

## 2016-08-31 DIAGNOSIS — Z08 Encounter for follow-up examination after completed treatment for malignant neoplasm: Secondary | ICD-10-CM | POA: Diagnosis not present

## 2016-08-31 DIAGNOSIS — Z85820 Personal history of malignant melanoma of skin: Secondary | ICD-10-CM | POA: Diagnosis not present

## 2016-08-31 DIAGNOSIS — L570 Actinic keratosis: Secondary | ICD-10-CM | POA: Diagnosis not present

## 2016-08-31 DIAGNOSIS — X32XXXD Exposure to sunlight, subsequent encounter: Secondary | ICD-10-CM | POA: Diagnosis not present

## 2016-08-31 DIAGNOSIS — L820 Inflamed seborrheic keratosis: Secondary | ICD-10-CM | POA: Diagnosis not present

## 2016-08-31 DIAGNOSIS — Z1283 Encounter for screening for malignant neoplasm of skin: Secondary | ICD-10-CM | POA: Diagnosis not present

## 2016-11-15 ENCOUNTER — Encounter: Payer: Self-pay | Admitting: Internal Medicine

## 2016-11-15 DIAGNOSIS — R001 Bradycardia, unspecified: Secondary | ICD-10-CM | POA: Diagnosis not present

## 2016-11-15 DIAGNOSIS — I495 Sick sinus syndrome: Secondary | ICD-10-CM | POA: Diagnosis not present

## 2016-12-28 DIAGNOSIS — Z0001 Encounter for general adult medical examination with abnormal findings: Secondary | ICD-10-CM | POA: Diagnosis not present

## 2016-12-28 DIAGNOSIS — N400 Enlarged prostate without lower urinary tract symptoms: Secondary | ICD-10-CM | POA: Diagnosis not present

## 2016-12-28 DIAGNOSIS — Z6826 Body mass index (BMI) 26.0-26.9, adult: Secondary | ICD-10-CM | POA: Diagnosis not present

## 2016-12-28 DIAGNOSIS — I10 Essential (primary) hypertension: Secondary | ICD-10-CM | POA: Diagnosis not present

## 2016-12-28 DIAGNOSIS — E1129 Type 2 diabetes mellitus with other diabetic kidney complication: Secondary | ICD-10-CM | POA: Diagnosis not present

## 2016-12-28 DIAGNOSIS — I4891 Unspecified atrial fibrillation: Secondary | ICD-10-CM | POA: Diagnosis not present

## 2016-12-28 DIAGNOSIS — E663 Overweight: Secondary | ICD-10-CM | POA: Diagnosis not present

## 2017-01-03 DIAGNOSIS — Z961 Presence of intraocular lens: Secondary | ICD-10-CM | POA: Diagnosis not present

## 2017-01-03 DIAGNOSIS — E11319 Type 2 diabetes mellitus with unspecified diabetic retinopathy without macular edema: Secondary | ICD-10-CM | POA: Diagnosis not present

## 2017-01-03 DIAGNOSIS — E113293 Type 2 diabetes mellitus with mild nonproliferative diabetic retinopathy without macular edema, bilateral: Secondary | ICD-10-CM | POA: Diagnosis not present

## 2017-01-03 DIAGNOSIS — Z9849 Cataract extraction status, unspecified eye: Secondary | ICD-10-CM | POA: Diagnosis not present

## 2017-01-23 DIAGNOSIS — R809 Proteinuria, unspecified: Secondary | ICD-10-CM | POA: Diagnosis not present

## 2017-01-23 DIAGNOSIS — E1122 Type 2 diabetes mellitus with diabetic chronic kidney disease: Secondary | ICD-10-CM | POA: Diagnosis not present

## 2017-01-23 DIAGNOSIS — N2581 Secondary hyperparathyroidism of renal origin: Secondary | ICD-10-CM | POA: Diagnosis not present

## 2017-01-23 DIAGNOSIS — I129 Hypertensive chronic kidney disease with stage 1 through stage 4 chronic kidney disease, or unspecified chronic kidney disease: Secondary | ICD-10-CM | POA: Diagnosis not present

## 2017-01-23 DIAGNOSIS — N183 Chronic kidney disease, stage 3 (moderate): Secondary | ICD-10-CM | POA: Diagnosis not present

## 2017-02-05 DIAGNOSIS — Z6826 Body mass index (BMI) 26.0-26.9, adult: Secondary | ICD-10-CM | POA: Diagnosis not present

## 2017-02-05 DIAGNOSIS — E663 Overweight: Secondary | ICD-10-CM | POA: Diagnosis not present

## 2017-02-05 DIAGNOSIS — Z1389 Encounter for screening for other disorder: Secondary | ICD-10-CM | POA: Diagnosis not present

## 2017-02-05 DIAGNOSIS — E063 Autoimmune thyroiditis: Secondary | ICD-10-CM | POA: Diagnosis not present

## 2017-02-05 DIAGNOSIS — E039 Hypothyroidism, unspecified: Secondary | ICD-10-CM | POA: Diagnosis not present

## 2017-02-05 DIAGNOSIS — I4891 Unspecified atrial fibrillation: Secondary | ICD-10-CM | POA: Diagnosis not present

## 2017-02-15 ENCOUNTER — Ambulatory Visit (INDEPENDENT_AMBULATORY_CARE_PROVIDER_SITE_OTHER): Payer: Medicare Other | Admitting: Internal Medicine

## 2017-02-15 ENCOUNTER — Encounter (INDEPENDENT_AMBULATORY_CARE_PROVIDER_SITE_OTHER): Payer: Self-pay

## 2017-02-15 ENCOUNTER — Encounter: Payer: Self-pay | Admitting: Internal Medicine

## 2017-02-15 VITALS — BP 140/80 | HR 79 | Ht 67.0 in | Wt 183.0 lb

## 2017-02-15 DIAGNOSIS — Z950 Presence of cardiac pacemaker: Secondary | ICD-10-CM | POA: Diagnosis not present

## 2017-02-15 DIAGNOSIS — I442 Atrioventricular block, complete: Secondary | ICD-10-CM

## 2017-02-15 DIAGNOSIS — I4821 Permanent atrial fibrillation: Secondary | ICD-10-CM

## 2017-02-15 DIAGNOSIS — I482 Chronic atrial fibrillation: Secondary | ICD-10-CM | POA: Diagnosis not present

## 2017-02-15 LAB — CUP PACEART INCLINIC DEVICE CHECK
Battery Impedance: 3900 Ohm
Implantable Lead Implant Date: 19980214
Implantable Lead Location: 753860
Lead Channel Impedance Value: 574 Ohm
Lead Channel Setting Sensing Sensitivity: 1.5 mV
MDC IDC LEAD IMPLANT DT: 19980214
MDC IDC LEAD LOCATION: 753859
MDC IDC MSMT BATTERY VOLTAGE: 2.74 V
MDC IDC MSMT LEADCHNL RV PACING THRESHOLD AMPLITUDE: 0.75 V
MDC IDC MSMT LEADCHNL RV PACING THRESHOLD PULSEWIDTH: 0.5 ms
MDC IDC PG IMPLANT DT: 20090422
MDC IDC PG SERIAL: 2057568
MDC IDC SESS DTM: 20180809154401
MDC IDC SET LEADCHNL RV PACING AMPLITUDE: 2.5 V
MDC IDC SET LEADCHNL RV PACING PULSEWIDTH: 0.5 ms
Pulse Gen Model: 5826

## 2017-02-15 NOTE — Patient Instructions (Signed)
Your physician recommends that you continue on your current medications as directed. Please refer to the Current Medication list given to you today.   Your physician wants you to follow-up in: YEAR WITH DR KLEIN  You will receive a reminder letter in the mail two months in advance. If you don't receive a letter, please call our office to schedule the follow-up appointment.  

## 2017-02-15 NOTE — Progress Notes (Signed)
Electrophysiology Office Note Date: 02/15/2017  ID:  Neil [name], DOB Mar 22, 1929, MRN [id number]  PCP: Redmond School, MD Electrophysiologist: Caryl Comes  CC: Pacemaker follow-up  Neil [name] is a 83 y.o. male who presents today for routine electrophysiology followup.  Since last being seen in our clinic, the patient reports doing very well. 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  There have been no palpitations, lightheadedness or syncope.    Still working on [employer] History: STJ dual chamber PPM implanted 1998 for complete heart block; gen change 2009   Past Medical History:  Diagnosis Date  . Atrial fibrillation ([place])   . Chronic kidney disease (CKD), stage III (moderate)   . Complete heart block ([place])   . Coronary artery disease    status post CABG1999  . Diabetes mellitus   . Dyslipidemia   . Hypertension   . Osteoarthritis   . Pacemaker    St Jude status post generator replacement 2007  . Renal artery stenosis (H[facility])    95%  left, 50% right   Past Surgical History:  Procedure Laterality Date  . CORONARY ARTERY BYPASS GRAFT     with stenting  . PACEMAKER INSERTION      Current Outpatient Prescriptions  Medication Sig Dispense Refill  . atorvastatin (LIPITOR) 40 MG tablet Take 40 mg by mouth daily.      . carvedilol (COREG) 6.25 MG tablet Take 6.25 mg by mouth 2 (two) times daily with a meal.    . dabigatran (PRADAXA) 150 MG CAPS capsule Take 1 capsule (150 mg total) by mouth 2 (two) times daily. 180 capsule 4  . furosemide (LASIX) 20 MG tablet Take 20 mg by mouth daily.     Marland Kitchen glipiZIDE (GLUCOTROL) 5 MG tablet Take 10 mg by mouth 2 (two) times daily before a meal.     . olmesartan (BENICAR) 20 MG tablet Take 1 tablet (20 mg total) by mouth daily. 30 tablet 11   No current facility-administered  medications for this visit.     Allergies:   Penicillins   Social History: Social History   Social History  . Marital status: Married    Spouse name: N/A  . Number of children: N/A  . Years of education: N/A   Occupational History  . Not on file.   Social History Main Topics  . Smoking status: Never Smoker  . Smokeless tobacco: Never Used  . Alcohol use No  . Drug use: No  . Sexual activity: Not on file   Other Topics Concern  . Not on file   Social History Narrative  . No narrative on file    Family History: Family History  Problem Relation Age of Onset  . Other Mother   . Cancer Mother        breast  . Alzheimer's disease Sister      Review of Systems: All other systems reviewed and are otherwise negative except as noted above.   Physical Exam: VS:  BP 140/80   Pulse 79   Ht 5\' 7"  (1.702 m)   Wt 183 lb (83 kg)   SpO2 98%   BMI 28.66 kg/m  , BMI Body mass index is 28.66 kg/m. Well developed and nourished in no acute distress HENT normal Neck supple with JVP-flat Carotids brisk and full without bruits Clear  Regular rate and rhythm, no murmurs or gallops Abd-soft with active BS without hepatomegaly No Clubbing cyanosis edema Skin-warm and dry A & Oriented  Grossly normal sensory and motor function   PPM Interrogation- reviewed in detail today,  See PACEART report  EKG: Apacing V pacing with PVC Recent Labs: No results found for requested labs within last 8760 hours.   Wt Readings from Last 3 Encounters:  02/15/17 183 lb (83 kg)  02/16/16 179 lb 6.4 oz (81.4 kg)  02/16/15 181 lb 3.2 oz (82.2 kg)     Other studies Reviewed: Additional studies/ records that were reviewed today include: Dr Olin Pia office notes  Assessment and Plan:  1.  Complete heart block Normal PPM function - pt is pacemaker dependent today See Pace Art report No changes today  2.  Permanent atrial fibrillation Continue Pradaxa for CHADS2VASC of at least  5 Recent CBC and BMET was drawn last week   wlll get labs  On Anticoagulation;  No bleeding issues     Current medicines are reviewed at length with the patient today.   The patient does not have concerns regarding his medicines.  The following changes were made today:  none  Labs/ tests ordered today include:  No orders of the defined types were placed in this encounter.    Disposition:   Follow up  1 year, [employer] transmissions every 3 months    Signed, Virl Axe, MD  02/15/2017 2:23 PM  [address] [place] [address] [phone] (office) [phone] (fax)

## 2017-04-28 DIAGNOSIS — Z23 Encounter for immunization: Secondary | ICD-10-CM | POA: Diagnosis not present

## 2017-05-29 ENCOUNTER — Other Ambulatory Visit: Payer: Self-pay | Admitting: *Deleted

## 2017-05-29 MED ORDER — DABIGATRAN ETEXILATE MESYLATE 150 MG PO CAPS: 150.0000 mg | ORAL_CAPSULE | Freq: Two times a day (BID) | ORAL | 2 refills | Status: DC

## 2017-05-29 NOTE — Telephone Encounter (Signed)
Pradaxa 150mg  received; pt is 83 yrs old, wt-83kg, Crea-1.29 on 12/28/16, last seen by Dr. Caryl Comes on 02/15/17, CrCl-50.64ml/min; will send in refill request to requested pharmacy.

## 2017-07-24 DIAGNOSIS — I10 Essential (primary) hypertension: Secondary | ICD-10-CM | POA: Diagnosis not present

## 2017-07-24 DIAGNOSIS — N183 Chronic kidney disease, stage 3 (moderate): Secondary | ICD-10-CM | POA: Diagnosis not present

## 2017-07-24 DIAGNOSIS — R809 Proteinuria, unspecified: Secondary | ICD-10-CM | POA: Diagnosis not present

## 2017-07-24 DIAGNOSIS — E1122 Type 2 diabetes mellitus with diabetic chronic kidney disease: Secondary | ICD-10-CM | POA: Diagnosis not present

## 2017-07-24 DIAGNOSIS — E559 Vitamin D deficiency, unspecified: Secondary | ICD-10-CM | POA: Diagnosis not present

## 2017-07-24 DIAGNOSIS — N2581 Secondary hyperparathyroidism of renal origin: Secondary | ICD-10-CM | POA: Diagnosis not present

## 2017-07-24 DIAGNOSIS — I129 Hypertensive chronic kidney disease with stage 1 through stage 4 chronic kidney disease, or unspecified chronic kidney disease: Secondary | ICD-10-CM | POA: Diagnosis not present

## 2017-07-24 DIAGNOSIS — D631 Anemia in chronic kidney disease: Secondary | ICD-10-CM | POA: Diagnosis not present

## 2017-07-26 DIAGNOSIS — E063 Autoimmune thyroiditis: Secondary | ICD-10-CM | POA: Diagnosis not present

## 2017-07-26 DIAGNOSIS — E119 Type 2 diabetes mellitus without complications: Secondary | ICD-10-CM | POA: Diagnosis not present

## 2017-07-26 DIAGNOSIS — N342 Other urethritis: Secondary | ICD-10-CM | POA: Diagnosis not present

## 2017-07-26 DIAGNOSIS — R4182 Altered mental status, unspecified: Secondary | ICD-10-CM | POA: Diagnosis not present

## 2017-07-26 DIAGNOSIS — Z6827 Body mass index (BMI) 27.0-27.9, adult: Secondary | ICD-10-CM | POA: Diagnosis not present

## 2017-07-26 DIAGNOSIS — Z1389 Encounter for screening for other disorder: Secondary | ICD-10-CM | POA: Diagnosis not present

## 2017-07-26 DIAGNOSIS — E663 Overweight: Secondary | ICD-10-CM | POA: Diagnosis not present

## 2017-08-21 ENCOUNTER — Other Ambulatory Visit (HOSPITAL_COMMUNITY): Payer: Self-pay | Admitting: Internal Medicine

## 2017-08-21 ENCOUNTER — Ambulatory Visit (HOSPITAL_COMMUNITY)
Admission: RE | Admit: 2017-08-21 | Discharge: 2017-08-21 | Disposition: A | Discharge status: Home / Self Care | Payer: Medicare Other | Source: Ambulatory Visit | Attending: Internal Medicine | Admitting: Internal Medicine

## 2017-08-21 DIAGNOSIS — I442 Atrioventricular block, complete: Secondary | ICD-10-CM | POA: Diagnosis not present

## 2017-08-21 DIAGNOSIS — I701 Atherosclerosis of renal artery: Secondary | ICD-10-CM | POA: Diagnosis not present

## 2017-08-21 DIAGNOSIS — M79604 Pain in right leg: Secondary | ICD-10-CM | POA: Diagnosis not present

## 2017-08-21 DIAGNOSIS — R600 Localized edema: Secondary | ICD-10-CM | POA: Diagnosis not present

## 2017-08-21 DIAGNOSIS — I872 Venous insufficiency (chronic) (peripheral): Secondary | ICD-10-CM | POA: Diagnosis not present

## 2017-08-21 DIAGNOSIS — Z6828 Body mass index (BMI) 28.0-28.9, adult: Secondary | ICD-10-CM | POA: Diagnosis not present

## 2017-08-21 DIAGNOSIS — M79605 Pain in left leg: Principal | ICD-10-CM

## 2017-08-21 DIAGNOSIS — E1129 Type 2 diabetes mellitus with other diabetic kidney complication: Secondary | ICD-10-CM | POA: Diagnosis not present

## 2017-08-21 DIAGNOSIS — N400 Enlarged prostate without lower urinary tract symptoms: Secondary | ICD-10-CM | POA: Diagnosis not present

## 2017-08-21 DIAGNOSIS — I4891 Unspecified atrial fibrillation: Secondary | ICD-10-CM | POA: Diagnosis not present

## 2017-08-21 DIAGNOSIS — Z1389 Encounter for screening for other disorder: Secondary | ICD-10-CM | POA: Diagnosis not present

## 2017-08-21 DIAGNOSIS — E782 Mixed hyperlipidemia: Secondary | ICD-10-CM | POA: Diagnosis not present

## 2017-08-21 DIAGNOSIS — L03115 Cellulitis of right lower limb: Secondary | ICD-10-CM | POA: Diagnosis not present

## 2017-11-09 ENCOUNTER — Emergency Department (HOSPITAL_COMMUNITY): Payer: Medicare Other

## 2017-11-09 ENCOUNTER — Inpatient Hospital Stay (HOSPITAL_COMMUNITY)
Admission: EM | Admit: 2017-11-09 | Discharge: 2017-11-11 | DRG: 683 | Disposition: A | Discharge status: Home / Self Care | Payer: Medicare Other | Attending: Internal Medicine | Admitting: Internal Medicine

## 2017-11-09 ENCOUNTER — Encounter (HOSPITAL_COMMUNITY): Payer: Self-pay | Admitting: Emergency Medicine

## 2017-11-09 ENCOUNTER — Other Ambulatory Visit: Payer: Self-pay

## 2017-11-09 DIAGNOSIS — R627 Adult failure to thrive: Secondary | ICD-10-CM | POA: Diagnosis present

## 2017-11-09 DIAGNOSIS — E063 Autoimmune thyroiditis: Secondary | ICD-10-CM | POA: Diagnosis not present

## 2017-11-09 DIAGNOSIS — Z681 Body mass index (BMI) 19 or less, adult: Secondary | ICD-10-CM | POA: Diagnosis not present

## 2017-11-09 DIAGNOSIS — E860 Dehydration: Secondary | ICD-10-CM | POA: Diagnosis present

## 2017-11-09 DIAGNOSIS — Z951 Presence of aortocoronary bypass graft: Secondary | ICD-10-CM

## 2017-11-09 DIAGNOSIS — M1990 Unspecified osteoarthritis, unspecified site: Secondary | ICD-10-CM | POA: Diagnosis present

## 2017-11-09 DIAGNOSIS — N400 Enlarged prostate without lower urinary tract symptoms: Secondary | ICD-10-CM | POA: Diagnosis not present

## 2017-11-09 DIAGNOSIS — F0390 Unspecified dementia without behavioral disturbance: Secondary | ICD-10-CM | POA: Diagnosis not present

## 2017-11-09 DIAGNOSIS — Z950 Presence of cardiac pacemaker: Secondary | ICD-10-CM | POA: Diagnosis not present

## 2017-11-09 DIAGNOSIS — I5022 Chronic systolic (congestive) heart failure: Secondary | ICD-10-CM | POA: Diagnosis present

## 2017-11-09 DIAGNOSIS — I4821 Permanent atrial fibrillation: Secondary | ICD-10-CM | POA: Diagnosis present

## 2017-11-09 DIAGNOSIS — R630 Anorexia: Secondary | ICD-10-CM | POA: Diagnosis not present

## 2017-11-09 DIAGNOSIS — Z880 Allergy status to penicillin: Secondary | ICD-10-CM

## 2017-11-09 DIAGNOSIS — Z7984 Long term (current) use of oral hypoglycemic drugs: Secondary | ICD-10-CM

## 2017-11-09 DIAGNOSIS — E119 Type 2 diabetes mellitus without complications: Secondary | ICD-10-CM | POA: Diagnosis not present

## 2017-11-09 DIAGNOSIS — N183 Chronic kidney disease, stage 3 unspecified: Secondary | ICD-10-CM

## 2017-11-09 DIAGNOSIS — G309 Alzheimer's disease, unspecified: Secondary | ICD-10-CM | POA: Diagnosis present

## 2017-11-09 DIAGNOSIS — Z79899 Other long term (current) drug therapy: Secondary | ICD-10-CM

## 2017-11-09 DIAGNOSIS — I701 Atherosclerosis of renal artery: Secondary | ICD-10-CM | POA: Diagnosis present

## 2017-11-09 DIAGNOSIS — I482 Chronic atrial fibrillation: Secondary | ICD-10-CM | POA: Diagnosis present

## 2017-11-09 DIAGNOSIS — N179 Acute kidney failure, unspecified: Secondary | ICD-10-CM | POA: Diagnosis present

## 2017-11-09 DIAGNOSIS — I130 Hypertensive heart and chronic kidney disease with heart failure and stage 1 through stage 4 chronic kidney disease, or unspecified chronic kidney disease: Secondary | ICD-10-CM | POA: Diagnosis present

## 2017-11-09 DIAGNOSIS — I2510 Atherosclerotic heart disease of native coronary artery without angina pectoris: Secondary | ICD-10-CM | POA: Diagnosis present

## 2017-11-09 DIAGNOSIS — W19XXXA Unspecified fall, initial encounter: Secondary | ICD-10-CM | POA: Diagnosis present

## 2017-11-09 DIAGNOSIS — R531 Weakness: Secondary | ICD-10-CM | POA: Diagnosis not present

## 2017-11-09 DIAGNOSIS — I10 Essential (primary) hypertension: Secondary | ICD-10-CM | POA: Diagnosis not present

## 2017-11-09 DIAGNOSIS — I442 Atrioventricular block, complete: Secondary | ICD-10-CM | POA: Diagnosis not present

## 2017-11-09 DIAGNOSIS — Z820 Family history of epilepsy and other diseases of the nervous system: Secondary | ICD-10-CM

## 2017-11-09 DIAGNOSIS — E1122 Type 2 diabetes mellitus with diabetic chronic kidney disease: Secondary | ICD-10-CM | POA: Diagnosis present

## 2017-11-09 DIAGNOSIS — R634 Abnormal weight loss: Secondary | ICD-10-CM | POA: Diagnosis not present

## 2017-11-09 DIAGNOSIS — F0280 Dementia in other diseases classified elsewhere without behavioral disturbance: Secondary | ICD-10-CM | POA: Diagnosis present

## 2017-11-09 DIAGNOSIS — E785 Hyperlipidemia, unspecified: Secondary | ICD-10-CM | POA: Diagnosis present

## 2017-11-09 HISTORY — DX: Unspecified dementia, unspecified severity, without behavioral disturbance, psychotic disturbance, mood disturbance, and anxiety: F03.90

## 2017-11-09 LAB — COMPREHENSIVE METABOLIC PANEL
ALK PHOS: 82 U/L (ref 38–126)
ALT: 15 U/L — AB (ref 17–63)
AST: 24 U/L (ref 15–41)
Albumin: 3.6 g/dL (ref 3.5–5.0)
Anion gap: 11 (ref 5–15)
BILIRUBIN TOTAL: 2.1 mg/dL — AB (ref 0.3–1.2)
BUN: 37 mg/dL — ABNORMAL HIGH (ref 6–20)
CALCIUM: 9.4 mg/dL (ref 8.9–10.3)
CO2: 22 mmol/L (ref 22–32)
CREATININE: 1.89 mg/dL — AB (ref 0.61–1.24)
Chloride: 107 mmol/L (ref 101–111)
GFR calc Af Amer: 35 mL/min — ABNORMAL LOW (ref >60)
GFR, EST NON AFRICAN AMERICAN: 30 mL/min — AB (ref >60)
Glucose, Bld: 112 mg/dL — ABNORMAL HIGH (ref 65–99)
Potassium: 4 mmol/L (ref 3.5–5.1)
SODIUM: 140 mmol/L (ref 135–145)
TOTAL PROTEIN: 6.6 g/dL (ref 6.5–8.1)

## 2017-11-09 LAB — CBC WITH DIFFERENTIAL/PLATELET
Basophils Absolute: 0 10*3/uL (ref 0.0–0.1)
Basophils Relative: 0 %
EOS ABS: 0 10*3/uL (ref 0.0–0.7)
EOS PCT: 0 %
HCT: 33.6 % — ABNORMAL LOW (ref 39.0–52.0)
Hemoglobin: 11.1 g/dL — ABNORMAL LOW (ref 13.0–17.0)
LYMPHS ABS: 0.8 10*3/uL (ref 0.7–4.0)
Lymphocytes Relative: 13 %
MCH: 29.5 pg (ref 26.0–34.0)
MCHC: 33 g/dL (ref 30.0–36.0)
MCV: 89.4 fL (ref 78.0–100.0)
Monocytes Absolute: 0.3 10*3/uL (ref 0.1–1.0)
Monocytes Relative: 5 %
Neutro Abs: 4.5 10*3/uL (ref 1.7–7.7)
Neutrophils Relative %: 82 %
PLATELETS: 168 10*3/uL (ref 150–400)
RBC: 3.76 MIL/uL — AB (ref 4.22–5.81)
RDW: 15.4 % (ref 11.5–15.5)
WBC: 5.6 10*3/uL (ref 4.0–10.5)

## 2017-11-09 LAB — URINALYSIS, ROUTINE W REFLEX MICROSCOPIC
Bilirubin Urine: NEGATIVE
GLUCOSE, UA: NEGATIVE mg/dL
HGB URINE DIPSTICK: NEGATIVE
KETONES UR: NEGATIVE mg/dL
LEUKOCYTES UA: NEGATIVE
Nitrite: NEGATIVE
PROTEIN: NEGATIVE mg/dL
Specific Gravity, Urine: 1.012 (ref 1.005–1.030)
pH: 5 (ref 5.0–8.0)

## 2017-11-09 LAB — I-STAT CG4 LACTIC ACID, ED: Lactic Acid, Venous: 0.94 mmol/L (ref 0.5–1.9)

## 2017-11-09 LAB — TROPONIN I
TROPONIN I: 0.04 ng/mL — AB (ref <0.03)
TROPONIN I: 0.05 ng/mL — AB (ref <0.03)
Troponin I: 0.04 ng/mL (ref <0.03)

## 2017-11-09 LAB — CBG MONITORING, ED: Glucose-Capillary: 103 mg/dL — ABNORMAL HIGH (ref 65–99)

## 2017-11-09 MED ORDER — SODIUM CHLORIDE 0.9 % IV BOLUS
500.0000 mL | Freq: Once | INTRAVENOUS | Status: AC
  Administered 2017-11-09: 500 mL via INTRAVENOUS

## 2017-11-09 MED ORDER — ENSURE ENLIVE PO LIQD: 237.0000 mL | Freq: Two times a day (BID) | ORAL | Status: DC

## 2017-11-09 MED ORDER — DABIGATRAN ETEXILATE MESYLATE 150 MG PO CAPS
150.0000 mg | ORAL_CAPSULE | Freq: Two times a day (BID) | ORAL | Status: DC
  Filled 2017-11-09 (×2): qty 1

## 2017-11-09 MED ORDER — SODIUM CHLORIDE 0.9 % IV SOLN
INTRAVENOUS | Status: DC
  Administered 2017-11-09 (×2): via INTRAVENOUS

## 2017-11-09 MED ORDER — ATORVASTATIN CALCIUM 40 MG PO TABS
40.0000 mg | ORAL_TABLET | Freq: Every day | ORAL | Status: DC
  Administered 2017-11-10: 40 mg via ORAL
  Filled 2017-11-09 (×2): qty 1

## 2017-11-09 MED ORDER — ONDANSETRON HCL 4 MG/2ML IJ SOLN
4.0000 mg | Freq: Four times a day (QID) | INTRAMUSCULAR | Status: DC | PRN
Start: 2017-11-09 — End: 2017-11-11

## 2017-11-09 MED ORDER — ONDANSETRON HCL 4 MG PO TABS: 4.0000 mg | ORAL_TABLET | Freq: Four times a day (QID) | ORAL | Status: DC | PRN

## 2017-11-09 NOTE — ED Provider Notes (Signed)
[facility] EMERGENCY DEPARTMENT Provider Note   CSN: [id number] Arrival date & time: 11/09/17  1354     History   Chief Complaint Chief Complaint  Patient presents with  . Dementia    HPI Neil [name] is a 84 y.o. male.  HPI Patient referred from his primary 89 office for worsening confusion and generalized weakness.  Patient has history of dementia and is unable to contribute to history.  Level 5 caveat applies.  Wife and grandson are at bedside.  States he has had a gradual decline over the last 2 to 3 months.  Now having difficulty ambulating.  Had a witnessed fall yesterday but denies any head injury.  Patient has decreased oral intake and loss of weight. Past Medical History:  Diagnosis Date  . Atrial fibrillation ([place])   . Chronic kidney disease (CKD), stage III (moderate) (HCC)   . Complete heart block ([place])   . Coronary artery disease    status post CABG1999  . Dementia   . Diabetes mellitus   . Dyslipidemia   . Hypertension   . Osteoarthritis   . Pacemaker    St Jude status post generator replacement 2007  . Renal artery stenosis (H[facility])    95%  left, 50% right    Patient Active Problem List   Diagnosis Date Noted  . Pacemaker-St. Jude 04/20/2011  . Permanent atrial fibrillation ([place]) 05/03/2010  . AV BLOCK, COMPLETE 11/02/2009  . ABDOMINAL PAIN-EPIGASTRIC 10/27/2008  . AODM 07/25/2008  . DYSLIPIDEMIA 07/25/2008  . Cardiomyopathy, ischemic 07/25/2008  . ATHEROSCLEROSIS OF RENAL ARTERY 07/25/2008  . HYPERTENSION, BENIGN 07/25/2008    Past Surgical History:  Procedure Laterality Date  . CORONARY ARTERY BYPASS GRAFT     with stenting  . PACEMAKER INSERTION          Home Medications    Prior to Admission medications   Medication Sig Start Date End Date Taking? Authorizing Provider  atorvastatin (LIPITOR) 40 MG tablet Take 40 mg by mouth daily.      [provider]  carvedilol (COREG) 6.25 MG tablet Take 6.25 mg by mouth 2  (two) times daily with a meal.    [provider]  dabigatran (PRADAXA) 150 MG CAPS capsule Take 1 capsule (150 mg total) by mouth 2 (two) times daily. 05/29/17   Deboraha Sprang, MD  furosemide (LASIX) 20 MG tablet Take 20 mg by mouth daily.  12/24/12   Deboraha Sprang, MD  glipiZIDE (GLUCOTROL) 5 MG tablet Take 10 mg by mouth 2 (two) times daily before a meal.     [provider]  olmesartan (BENICAR) 20 MG tablet Take 1 tablet (20 mg total) by mouth daily. 11/14/10 06/10/19  Deboraha Sprang, MD    Family History Family History  Problem Relation Age of Onset  . Other Mother   . Cancer Mother        breast  . Alzheimer's disease Sister     Social History Social History   Tobacco Use  . Smoking status: Never Smoker  . Smokeless tobacco: Never Used  Substance Use Topics  . Alcohol use: No  . Drug use: No     Allergies   Penicillins   Review of Systems Review of Systems  Unable to perform ROS: Dementia     Physical Exam Updated Vital Signs BP 134/77   Pulse 69   Temp 99.1 F (37.3 C) (Rectal)   Resp 12   Ht 5\' 6"  (1.676 m)  Wt 74.8 kg (165 lb)   SpO2 100%   BMI 26.63 kg/m   Physical Exam  Constitutional: He appears well-developed and well-nourished. No distress.  HENT:  Head: Normocephalic and atraumatic.  Mouth/Throat: Oropharynx is clear and moist.  No obvious head injury.  Eyes: Pupils are equal, round, and reactive to light. EOM are normal.  Neck: Normal range of motion. Neck supple.  Marked kyphosis   Cardiovascular: Normal rate and regular rhythm. Exam reveals no gallop and no friction rub.  No murmur heard. Pulmonary/Chest: Effort normal and breath sounds normal. No stridor. No respiratory distress. He has no wheezes. He has no rales. He exhibits no tenderness.  Abdominal: Soft. Bowel sounds are normal. There is no tenderness. There is no rebound and no guarding.  Musculoskeletal: Normal range of motion. He exhibits no edema or  tenderness.  No lower extremity swelling or asymmetry.  Neurological:  Mildly drowsy appearing.  4/5 motor in all extremities.  Sensation grossly intact.  Skin: Skin is warm and dry. No rash noted. He is not diaphoretic. No erythema.  Psychiatric: He has a normal mood and affect. His behavior is normal.  Nursing note and vitals reviewed.    ED Treatments / Results  Labs (all labs ordered are listed, but only abnormal results are displayed) Labs Reviewed  COMPREHENSIVE METABOLIC PANEL - Abnormal; Notable for the following components:      Result Value   Glucose, Bld 112 (*)    BUN 37 (*)    Creatinine, Ser 1.89 (*)    ALT 15 (*)    Total Bilirubin 2.1 (*)    GFR calc non Af Amer 30 (*)    GFR calc Af Amer 35 (*)    All other components within normal limits  CBC WITH DIFFERENTIAL/PLATELET - Abnormal; Notable for the following components:   RBC 3.76 (*)    Hemoglobin 11.1 (*)    HCT 33.6 (*)    All other components within normal limits  TROPONIN I - Abnormal; Notable for the following components:   Troponin I 0.04 (*)    All other components within normal limits  CBG MONITORING, ED - Abnormal; Notable for the following components:   Glucose-Capillary 103 (*)    All other components within normal limits  URINALYSIS, ROUTINE W REFLEX MICROSCOPIC  TROPONIN I  I-STAT CG4 LACTIC ACID, ED    EKG EKG Interpretation  Date/Time:  Friday Nov 09 2017 14:17:38 EDT Ventricular Rate:  70 PR Interval:    QRS Duration: 189 QT Interval:  475 QTC Calculation: 513 R Axis:   -80 Text Interpretation:  Ventricular-paced rhythm No further analysis attempted due to paced rhythm Confirmed by Julianne Rice [phone]) on 11/09/2017 2:59:40 PM   Radiology Ct Head Wo Contrast  Result Date: 11/09/2017 CLINICAL DATA:  Worsening dementia, decreased appetite, weight loss EXAM: CT HEAD WITHOUT CONTRAST CT CERVICAL SPINE WITHOUT CONTRAST TECHNIQUE: Multidetector CT imaging of the head and cervical  spine was performed following the standard protocol without intravenous contrast. Multiplanar CT image reconstructions of the cervical spine were also generated. COMPARISON:  None. FINDINGS: CT HEAD FINDINGS Brain: No evidence of acute infarction, hemorrhage, hydrocephalus, extra-axial collection or mass lesion/mass effect. Global cortical atrophy. Subcortical white matter and periventricular small vessel ischemic changes. Vascular: Intracranial atherosclerosis. Skull: Normal. Negative for fracture or focal lesion. Sinuses/Orbits: The visualized paranasal sinuses are essentially clear. The mastoid air cells are unopacified. Other: None. CT CERVICAL SPINE FINDINGS Alignment: Normal cervical lordosis. Skull base and vertebrae: No  acute fracture. No primary bone lesion or focal pathologic process. Soft tissues and spinal canal: No prevertebral fluid or swelling. No visible canal hematoma. Disc levels:  Mild degenerative changes, most prominent at C3-4. Spinal canal is patent. Upper chest: Visualized lung apices are clear. Other: Visualized thyroid is unremarkable. IMPRESSION: No evidence of acute intracranial abnormality. Atrophy with small vessel ischemic changes. No evidence of traumatic injury to the cervical spine. Mild degenerative changes. Electronically Signed   By: Julian Hy M.D.   On: 11/09/2017 15:03   Ct Cervical Spine Wo Contrast  Result Date: 11/09/2017 CLINICAL DATA:  Worsening dementia, decreased appetite, weight loss EXAM: CT HEAD WITHOUT CONTRAST CT CERVICAL SPINE WITHOUT CONTRAST TECHNIQUE: Multidetector CT imaging of the head and cervical spine was performed following the standard protocol without intravenous contrast. Multiplanar CT image reconstructions of the cervical spine were also generated. COMPARISON:  None. FINDINGS: CT HEAD FINDINGS Brain: No evidence of acute infarction, hemorrhage, hydrocephalus, extra-axial collection or mass lesion/mass effect. Global cortical atrophy.  Subcortical white matter and periventricular small vessel ischemic changes. Vascular: Intracranial atherosclerosis. Skull: Normal. Negative for fracture or focal lesion. Sinuses/Orbits: The visualized paranasal sinuses are essentially clear. The mastoid air cells are unopacified. Other: None. CT CERVICAL SPINE FINDINGS Alignment: Normal cervical lordosis. Skull base and vertebrae: No acute fracture. No primary bone lesion or focal pathologic process. Soft tissues and spinal canal: No prevertebral fluid or swelling. No visible canal hematoma. Disc levels:  Mild degenerative changes, most prominent at C3-4. Spinal canal is patent. Upper chest: Visualized lung apices are clear. Other: Visualized thyroid is unremarkable. IMPRESSION: No evidence of acute intracranial abnormality. Atrophy with small vessel ischemic changes. No evidence of traumatic injury to the cervical spine. Mild degenerative changes. Electronically Signed   By: Julian Hy M.D.   On: 11/09/2017 15:03    Procedures Procedures (including critical care time)  Medications Ordered in ED Medications  sodium chloride 0.9 % bolus 500 mL (500 mLs Intravenous New Bag/Given 11/09/17 1654)  sodium chloride 0.9 % bolus 500 mL (0 mLs Intravenous Stopped 11/09/17 1556)     Initial Impression / Assessment and Plan / ED Course  I have reviewed the triage vital signs and the nursing notes.  Pertinent labs & imaging results that were available during my care of the patient were reviewed by me and considered in my medical decision making (see chart for details).     Initial hypotension is improved with IV fluids.  Patient is clinically dehydrated.  Has acute on chronic renal insufficiency consistent with dehydration.  Discussed with hospitalist will see patient in the emergency department and admit.  Final Clinical Impressions(s) / ED Diagnoses   Final diagnoses:  Dehydration  FTT (failure to thrive) in adult    ED Discharge Orders    None        Julianne Rice, MD 11/09/17 1711

## 2017-11-09 NOTE — ED Triage Notes (Signed)
Patient had PCP appointment today and was sent to ER by Dr Gerarda Fraction for worsening dementia. Per patient patient has slouched position that started 2 months ago as well and decreased in appetite and weight.

## 2017-11-09 NOTE — H&P (Signed)
History and Physical    Neil [name] KGU:[id number] DOB: [birth year] DOA: 11/09/2017  PCP: Redmond School, MD  Patient coming from: Home  Chief Complaint: Generalized weakness  HPI: Neil [name] is a 84 y.o. male with medical history significant of dementia, chronic kidney disease, pacemaker, A. fib, hypertension, diabetes brought in by family members today because of a recent fall they went to see his primary care physician is into the emergency department for failure to thrive.  Patient has not been eating or drinking very well for several months.  His wife reports that for at least 3 months he has had a significant decline where he is not eating as well he still ambulating with a walker.  Wife reports no fevers.  He has had no nausea vomiting or diarrhea.  He complains of no pain.  Patient found to be dehydrated with acute kidney injury and referred for admission for such.   Review of Systems: As per HPI otherwise 10 point review of systems negativ per wife  Past Medical History:  Diagnosis Date  . Atrial fibrillation ([place])   . Chronic kidney disease (CKD), stage III (moderate) (HCC)   . Complete heart block ([place])   . Coronary artery disease    status post CABG1999  . Dementia   . Diabetes mellitus   . Dyslipidemia   . Hypertension   . Osteoarthritis   . Pacemaker    St Jude status post generator replacement 2007  . Renal artery stenosis (HCC)    95%  left, 50% right    Past Surgical History:  Procedure Laterality Date  . CORONARY ARTERY BYPASS GRAFT     with stenting  . PACEMAKER INSERTION       reports that he has never smoked. He has never used smokeless tobacco. He reports that he does not drink alcohol or use drugs.  Allergies  Allergen Reactions  . Penicillins     Itching    Family History  Problem Relation Age of Onset  . Other Mother   . Cancer Mother        breast  . Alzheimer's disease Sister     Prior to Admission medications   Medication  Sig Start Date End Date Taking? Authorizing Provider  atorvastatin (LIPITOR) 40 MG tablet Take 40 mg by mouth daily.      [provider]  carvedilol (COREG) 6.25 MG tablet Take 6.25 mg by mouth 2 (two) times daily with a meal.    [provider]  dabigatran (PRADAXA) 150 MG CAPS capsule Take 1 capsule (150 mg total) by mouth 2 (two) times daily. 05/29/17   Deboraha Sprang, MD  furosemide (LASIX) 20 MG tablet Take 20 mg by mouth daily.  12/24/12   Deboraha Sprang, MD  glipiZIDE (GLUCOTROL) 5 MG tablet Take 10 mg by mouth 2 (two) times daily before a meal.     [provider]  olmesartan (BENICAR) 20 MG tablet Take 1 tablet (20 mg total) by mouth daily. 11/14/10 06/10/19  Deboraha Sprang, MD    Physical Exam: Vitals:   11/09/17 1500 11/09/17 1530 11/09/17 1600 11/09/17 1630  BP: (!) 108/58 118/64 126/72 134/77  Pulse: 69 69 69 69  Resp: 15 13 13 12   Temp:      TempSrc:      SpO2: 98% 99% 99% 100%  Weight:      Height:          Constitutional: NAD, calm, comfortable pleasantly demented  Vitals:   11/09/17 1500 11/09/17 1530 11/09/17 1600 11/09/17 1630  BP: (!) 108/58 118/64 126/72 134/77  Pulse: 69 69 69 69  Resp: 15 13 13 12   Temp:      TempSrc:      SpO2: 98% 99% 99% 100%  Weight:      Height:       Eyes: PERRL, lids and conjunctivae normal ENMT: Mucous membranes are dry. Posterior pharynx clear of any exudate or lesions.Normal dentition.  Neck: normal, supple, no masses, no thyromegaly Respiratory: clear to auscultation bilaterally, no wheezing, no crackles. Normal respiratory effort. No accessory muscle use.  Cardiovascular: Regular rate and rhythm, no murmurs / rubs / gallops. No extremity edema. 2+ pedal pulses. No carotid bruits.  Abdomen: no tenderness, no masses palpated. No hepatosplenomegaly. Bowel sounds positive.  Musculoskeletal: no clubbing / cyanosis. No joint deformity upper and lower extremities. Good ROM, no contractures. Normal muscle  tone.  Skin: no rashes, lesions, ulcers. No induration Neurologic: CN 2-12 grossly intact. Sensation intact, DTR normal. Strength 5/5 in all 4.  Psychiatric: Normal judgment and insight. Alert and oriented x 3. Normal mood.    Labs on Admission: I have personally reviewed following labs and imaging studies  CBC: Recent Labs  Lab 11/09/17 1439  WBC 5.6  NEUTROABS 4.5  HGB 11.1*  HCT 33.6*  MCV 89.4  PLT 409   Basic Metabolic Panel: Recent Labs  Lab 11/09/17 1439  NA 140  K 4.0  CL 107  CO2 22  GLUCOSE 112*  BUN 37*  CREATININE 1.89*  CALCIUM 9.4   GFR: Estimated Creatinine Clearance: 24.8 mL/min (A) (by C-G formula based on SCr of 1.89 mg/dL (H)). Liver Function Tests: Recent Labs  Lab 11/09/17 1439  AST 24  ALT 15*  ALKPHOS 82  BILITOT 2.1*  PROT 6.6  ALBUMIN 3.6   No results for input(s): LIPASE, AMYLASE in the last 168 hours. No results for input(s): AMMONIA in the last 168 hours. Coagulation Profile: No results for input(s): INR, PROTIME in the last 168 hours. Cardiac Enzymes: Recent Labs  Lab 11/09/17 1439  TROPONINI 0.04*   BNP (last 3 results) No results for input(s): PROBNP in the last 8760 hours. HbA1C: No results for input(s): HGBA1C in the last 72 hours. CBG: Recent Labs  Lab 11/09/17 1437  GLUCAP 103*   Lipid Profile: No results for input(s): CHOL, HDL, LDLCALC, TRIG, CHOLHDL, LDLDIRECT in the last 72 hours. Thyroid Function Tests: No results for input(s): TSH, T4TOTAL, FREET4, T3FREE, THYROIDAB in the last 72 hours. Anemia Panel: No results for input(s): VITAMINB12, FOLATE, FERRITIN, TIBC, IRON, RETICCTPCT in the last 72 hours. Urine analysis:    Component Value Date/Time   COLORURINE YELLOW 11/09/2017 1600   APPEARANCEUR CLEAR 11/09/2017 1600   LABSPEC 1.012 11/09/2017 1600   PHURINE 5.0 11/09/2017 1600   GLUCOSEU NEGATIVE 11/09/2017 1600   HGBUR NEGATIVE 11/09/2017 1600   BILIRUBINUR NEGATIVE 11/09/2017 1600   KETONESUR  NEGATIVE 11/09/2017 1600   PROTEINUR NEGATIVE 11/09/2017 1600   NITRITE NEGATIVE 11/09/2017 1600   LEUKOCYTESUR NEGATIVE 11/09/2017 1600   Sepsis Labs: !!!!!!!!!!!!!!!!!!!!!!!!!!!!!!!!!!!!!!!!!!!! @LABRCNTIP (procalcitonin:4,lacticidven:4) )No results found for this or any previous visit (from the past 240 hour(s)).   Radiological Exams on Admission: Ct Head Wo Contrast  Result Date: 11/09/2017 CLINICAL DATA:  Worsening dementia, decreased appetite, weight loss EXAM: CT HEAD WITHOUT CONTRAST CT CERVICAL SPINE WITHOUT CONTRAST TECHNIQUE: Multidetector CT imaging of the head and cervical spine was performed following the standard protocol without intravenous  contrast. Multiplanar CT image reconstructions of the cervical spine were also generated. COMPARISON:  None. FINDINGS: CT HEAD FINDINGS Brain: No evidence of acute infarction, hemorrhage, hydrocephalus, extra-axial collection or mass lesion/mass effect. Global cortical atrophy. Subcortical white matter and periventricular small vessel ischemic changes. Vascular: Intracranial atherosclerosis. Skull: Normal. Negative for fracture or focal lesion. Sinuses/Orbits: The visualized paranasal sinuses are essentially clear. The mastoid air cells are unopacified. Other: None. CT CERVICAL SPINE FINDINGS Alignment: Normal cervical lordosis. Skull base and vertebrae: No acute fracture. No primary bone lesion or focal pathologic process. Soft tissues and spinal canal: No prevertebral fluid or swelling. No visible canal hematoma. Disc levels:  Mild degenerative changes, most prominent at C3-4. Spinal canal is patent. Upper chest: Visualized lung apices are clear. Other: Visualized thyroid is unremarkable. IMPRESSION: No evidence of acute intracranial abnormality. Atrophy with small vessel ischemic changes. No evidence of traumatic injury to the cervical spine. Mild degenerative changes. Electronically Signed   By: Julian Hy M.D.   On: 11/09/2017 15:03   Ct  Cervical Spine Wo Contrast  Result Date: 11/09/2017 CLINICAL DATA:  Worsening dementia, decreased appetite, weight loss EXAM: CT HEAD WITHOUT CONTRAST CT CERVICAL SPINE WITHOUT CONTRAST TECHNIQUE: Multidetector CT imaging of the head and cervical spine was performed following the standard protocol without intravenous contrast. Multiplanar CT image reconstructions of the cervical spine were also generated. COMPARISON:  None. FINDINGS: CT HEAD FINDINGS Brain: No evidence of acute infarction, hemorrhage, hydrocephalus, extra-axial collection or mass lesion/mass effect. Global cortical atrophy. Subcortical white matter and periventricular small vessel ischemic changes. Vascular: Intracranial atherosclerosis. Skull: Normal. Negative for fracture or focal lesion. Sinuses/Orbits: The visualized paranasal sinuses are essentially clear. The mastoid air cells are unopacified. Other: None. CT CERVICAL SPINE FINDINGS Alignment: Normal cervical lordosis. Skull base and vertebrae: No acute fracture. No primary bone lesion or focal pathologic process. Soft tissues and spinal canal: No prevertebral fluid or swelling. No visible canal hematoma. Disc levels:  Mild degenerative changes, most prominent at C3-4. Spinal canal is patent. Upper chest: Visualized lung apices are clear. Other: Visualized thyroid is unremarkable. IMPRESSION: No evidence of acute intracranial abnormality. Atrophy with small vessel ischemic changes. No evidence of traumatic injury to the cervical spine. Mild degenerative changes. Electronically Signed   By: Julian Hy M.D.   On: 11/09/2017 15:03    EKG: Independently reviewed.  Ventricular paced rhythm  Old chart review  Case discussed with EDP  Chest x-ray reviewed no edema or infiltrate    Assessment/Plan 84 year old male with dementia comes in with failure to thrive and dehydration with acute kidney injury Principal Problem:   Dehydration IV fluids.-Repeat creatinine in the  morning  Active Problems:   FTT (failure to thrive) in adult-progression of his dementia patient has no focal neurological deficits    AKI (acute kidney injury) (HCC)-creatinine bump up to one-point 8 repeat in the morning should resolve and get better after some IV fluids    Permanent atrial fibrillation (HCC)-stable    HYPERTENSION, BENIGN-stable patient blood pressure soft in the ED we will hold blood pressure medications at this time    Pacemaker-St. Jude-noted    Dementia-noted    Renal artery stenosis (HCC)-stable    DVT prophylaxis: SCDs Code Status: Full Family Communication: Wife Disposition Plan: Per day team Consults called: None Admission status: Observation   Joeann Steppe A MD [facility]  If 7PM-7AM, please contact night-coverage [email or web address] Password [email or web address]  11/09/2017, 5:25 PM

## 2017-11-09 NOTE — ED Notes (Signed)
Report given to 300 RN at this time.  

## 2017-11-10 ENCOUNTER — Observation Stay (HOSPITAL_COMMUNITY): Payer: Medicare Other

## 2017-11-10 DIAGNOSIS — E860 Dehydration: Secondary | ICD-10-CM

## 2017-11-10 DIAGNOSIS — I361 Nonrheumatic tricuspid (valve) insufficiency: Secondary | ICD-10-CM

## 2017-11-10 DIAGNOSIS — I701 Atherosclerosis of renal artery: Secondary | ICD-10-CM | POA: Diagnosis present

## 2017-11-10 DIAGNOSIS — E785 Hyperlipidemia, unspecified: Secondary | ICD-10-CM | POA: Diagnosis present

## 2017-11-10 DIAGNOSIS — Z951 Presence of aortocoronary bypass graft: Secondary | ICD-10-CM | POA: Diagnosis not present

## 2017-11-10 DIAGNOSIS — N179 Acute kidney failure, unspecified: Secondary | ICD-10-CM | POA: Diagnosis not present

## 2017-11-10 DIAGNOSIS — E1122 Type 2 diabetes mellitus with diabetic chronic kidney disease: Secondary | ICD-10-CM | POA: Diagnosis present

## 2017-11-10 DIAGNOSIS — I5022 Chronic systolic (congestive) heart failure: Secondary | ICD-10-CM | POA: Diagnosis present

## 2017-11-10 DIAGNOSIS — I130 Hypertensive heart and chronic kidney disease with heart failure and stage 1 through stage 4 chronic kidney disease, or unspecified chronic kidney disease: Secondary | ICD-10-CM | POA: Diagnosis present

## 2017-11-10 DIAGNOSIS — Z820 Family history of epilepsy and other diseases of the nervous system: Secondary | ICD-10-CM | POA: Diagnosis not present

## 2017-11-10 DIAGNOSIS — G309 Alzheimer's disease, unspecified: Secondary | ICD-10-CM | POA: Diagnosis present

## 2017-11-10 DIAGNOSIS — N183 Chronic kidney disease, stage 3 (moderate): Secondary | ICD-10-CM

## 2017-11-10 DIAGNOSIS — W19XXXA Unspecified fall, initial encounter: Secondary | ICD-10-CM | POA: Diagnosis present

## 2017-11-10 DIAGNOSIS — I2510 Atherosclerotic heart disease of native coronary artery without angina pectoris: Secondary | ICD-10-CM | POA: Diagnosis present

## 2017-11-10 DIAGNOSIS — R627 Adult failure to thrive: Secondary | ICD-10-CM | POA: Diagnosis present

## 2017-11-10 DIAGNOSIS — F0390 Unspecified dementia without behavioral disturbance: Secondary | ICD-10-CM

## 2017-11-10 DIAGNOSIS — M1990 Unspecified osteoarthritis, unspecified site: Secondary | ICD-10-CM | POA: Diagnosis present

## 2017-11-10 DIAGNOSIS — R531 Weakness: Secondary | ICD-10-CM | POA: Diagnosis not present

## 2017-11-10 DIAGNOSIS — I442 Atrioventricular block, complete: Secondary | ICD-10-CM | POA: Diagnosis present

## 2017-11-10 DIAGNOSIS — F0280 Dementia in other diseases classified elsewhere without behavioral disturbance: Secondary | ICD-10-CM | POA: Diagnosis present

## 2017-11-10 DIAGNOSIS — I482 Chronic atrial fibrillation: Secondary | ICD-10-CM | POA: Diagnosis present

## 2017-11-10 DIAGNOSIS — Z880 Allergy status to penicillin: Secondary | ICD-10-CM | POA: Diagnosis not present

## 2017-11-10 DIAGNOSIS — Z7984 Long term (current) use of oral hypoglycemic drugs: Secondary | ICD-10-CM | POA: Diagnosis not present

## 2017-11-10 DIAGNOSIS — Z79899 Other long term (current) drug therapy: Secondary | ICD-10-CM | POA: Diagnosis not present

## 2017-11-10 DIAGNOSIS — Z950 Presence of cardiac pacemaker: Secondary | ICD-10-CM | POA: Diagnosis not present

## 2017-11-10 HISTORY — PX: TRANSTHORACIC ECHOCARDIOGRAM: SHX275

## 2017-11-10 LAB — ECHOCARDIOGRAM COMPLETE
HEIGHTINCHES: 66 in
Weight: 2366.86 oz

## 2017-11-10 LAB — BASIC METABOLIC PANEL
Anion gap: 9 (ref 5–15)
BUN: 31 mg/dL — AB (ref 6–20)
CO2: 25 mmol/L (ref 22–32)
CREATININE: 1.54 mg/dL — AB (ref 0.61–1.24)
Calcium: 9 mg/dL (ref 8.9–10.3)
Chloride: 107 mmol/L (ref 101–111)
GFR, EST AFRICAN AMERICAN: 45 mL/min — AB (ref >60)
GFR, EST NON AFRICAN AMERICAN: 39 mL/min — AB (ref >60)
Glucose, Bld: 52 mg/dL — ABNORMAL LOW (ref 65–99)
POTASSIUM: 3.4 mmol/L — AB (ref 3.5–5.1)
SODIUM: 141 mmol/L (ref 135–145)

## 2017-11-10 LAB — CBC
HEMATOCRIT: 33 % — AB (ref 39.0–52.0)
Hemoglobin: 10.8 g/dL — ABNORMAL LOW (ref 13.0–17.0)
MCH: 29.5 pg (ref 26.0–34.0)
MCHC: 32.7 g/dL (ref 30.0–36.0)
MCV: 90.2 fL (ref 78.0–100.0)
PLATELETS: 151 10*3/uL (ref 150–400)
RBC: 3.66 MIL/uL — AB (ref 4.22–5.81)
RDW: 15.1 % (ref 11.5–15.5)
WBC: 4.8 10*3/uL (ref 4.0–10.5)

## 2017-11-10 LAB — TSH: TSH: 3.404 u[IU]/mL (ref 0.350–4.500)

## 2017-11-10 LAB — TROPONIN I: TROPONIN I: 0.05 ng/mL — AB (ref <0.03)

## 2017-11-10 MED ORDER — ALPRAZOLAM 0.5 MG PO TABS
0.5000 mg | ORAL_TABLET | Freq: Every evening | ORAL | Status: DC | PRN
  Administered 2017-11-10: 0.5 mg via ORAL
  Filled 2017-11-10: qty 1

## 2017-11-10 MED ORDER — DABIGATRAN ETEXILATE MESYLATE 75 MG PO CAPS
75.0000 mg | ORAL_CAPSULE | Freq: Two times a day (BID) | ORAL | Status: DC
  Administered 2017-11-10 – 2017-11-11 (×2): 75 mg via ORAL
  Filled 2017-11-10 (×2): qty 1

## 2017-11-10 MED ORDER — CARVEDILOL 3.125 MG PO TABS
6.2500 mg | ORAL_TABLET | Freq: Two times a day (BID) | ORAL | Status: DC
  Administered 2017-11-10 – 2017-11-11 (×2): 6.25 mg via ORAL
  Filled 2017-11-10 (×2): qty 2

## 2017-11-10 MED ORDER — SODIUM CHLORIDE 0.9 % IV SOLN
INTRAVENOUS | Status: AC
  Administered 2017-11-10: 17:00:00 via INTRAVENOUS

## 2017-11-10 NOTE — Progress Notes (Signed)
PT Cancellation Note  Patient Details Name: Neil [name] MRN: [id number] DOB: 1929-03-05   Cancelled Treatment:    Reason Eval/Treat Not Completed: Patient's level of consciousness   Rayetta Humphrey, PT CLT [phone] 11/10/2017, 8:57 AM

## 2017-11-10 NOTE — Progress Notes (Signed)
CRITICAL VALUE ALERT  Critical Value:  Trop 0.05  Date & Time Notied:  11/10/17 0728  Provider Notified: Dr. Jerilee Hoh  Orders Received/Actions taken: Awaiting return page

## 2017-11-10 NOTE — Progress Notes (Signed)
  Echocardiogram 2D Echocardiogram has been performed.  Emry Tobin T Sasuke Yaffe 11/10/2017, 10:18 AM

## 2017-11-10 NOTE — Progress Notes (Addendum)
PROGRESS NOTE    Neil [name]  ZOX:[id number] DOB: [birth year] DOA: 11/09/2017 PCP: Redmond School, MD     Brief Narrative:  84 year old man admitted from home on 5/3 due to generalized weakness and adult failure to thrive.  He has a history significant for dementia, presumed Alzheimer's type, A. fib, hypertension, diabetes.  Wife reports no systemic symptoms including no fevers, no nausea, no vomiting, diarrhea, no pain.  He was found to be dehydrated with acute kidney injury and referred for admission.  On 5/4 at 4 AM received a dose of benzodiazepine as he had been up all night and when I see him he has been very lethargic and sleepy.   Assessment & Plan:   Principal Problem:   Dehydration Active Problems:   Permanent atrial fibrillation (HCC)   HYPERTENSION, BENIGN   Pacemaker-St. Jude   FTT (failure to thrive) in adult   AKI (acute kidney injury) (HCC)   Dementia   Renal artery stenosis (HCC)   CKD (chronic kidney disease) stage 3, GFR 30-59 ml/min (HCC)   Adult failure to thrive/generalized weakness -Suspect this is natural progression of his dementia. -He would certainly be appropriate for palliative care consultation and will request when available. -Agree with continued gentle IV fluids overnight in consideration of his systolic heart failure. -Check TSH, vitamin B12. -PT evaluation once more alert. -Certainly his systolic heart failure, which appears worsened as per comparison with echo in 2013, could be playing a role in his failure to thrive. -He is very lethargic this morning, after discussion with RN it appears she received a dose of Xanax at 4 AM after he had spent a restless night.  Discussed with nursing staff to relate to night shift to not give any sedating medications in the morning.  Chronic systolic heart failure -Echo shows an ejection fraction of 15 to 20% with diffuse hypokinesis and not technically sufficient to allow evaluation of LV diastolic  function.  Per echo in 2013 his EF was 20 to 30%. -He is not on adequate medical therapy for his systolic heart failure. -Cannot do ACE inhibitor/ARB/ARNI due to renal dysfunction. -Start Coreg 6.25 mg twice daily with goal dose of 25 mg twice daily if blood pressure and heart rate tolerate. -Continue statin. -Consider addition of spironolactone once beta-blocker has been maximally titrated if blood pressure allows.  Atrial fibrillation -Rate controlled.   -anticoagulated on Pradaxa, will continue.  Elevated troponin -Flat in the range of 0.04-0.05, unable to interpret EKG as it is a paced rhythm -Likely due to profound systolic heart failure, no further cardiac work-up planned for at present.  Acute on chronic kidney disease stage III -Baseline creatinine appears to be around 1.5. -Was 1.89 and admission is down to 1.54 today which is his baseline after IV fluids.    DVT prophylaxis: Fully anticoagulated on Pradaxa Code Status: Full code Family Communication: Wife at bedside updated on plan of care and all questions answered Disposition Plan: Pending improvement in mental state  Consultants:   Palliative care when available  Procedures:   2D echo as above  Antimicrobials:  Anti-infectives (From admission, onward)   None       Subjective: Lethargic, unable to rise with voice and sternal pressure although he does groan to sternal rub  Objective: Vitals:   11/09/17 1919 11/09/17 2115 11/10/17 0559 11/10/17 1413  BP:  121/63 114/75 124/77  Pulse:  76 84 76  Resp:  16 16 16   Temp:  97.9 F (36.6 C)  97.6 F (36.4 C)   TempSrc:  Oral Axillary   SpO2:  100% 94% 97%  Weight: 66 kg (145 lb 8.1 oz)  67.1 kg (147 lb 14.9 oz)   Height: 5\' 6"  (1.676 m)       Intake/Output Summary (Last 24 hours) at 11/10/2017 1606 Last data filed at 11/10/2017 0900 Gross per 24 hour  Intake 2071.38 ml  Output 227 ml  Net 1844.38 ml   Filed Weights   11/09/17 1403 11/09/17 1919  11/10/17 0559  Weight: 74.8 kg (165 lb) 66 kg (145 lb 8.1 oz) 67.1 kg (147 lb 14.9 oz)    Examination:  General exam: Lethargic, unable to assess orientation Respiratory system: Clear to auscultation. Respiratory effort normal. Cardiovascular system:RRR. No murmurs, rubs, gallops. Gastrointestinal system: Abdomen is nondistended, soft and nontender. No organomegaly or masses felt. Normal bowel sounds heard. Central nervous system: Unable to assess given current mental state Extremities: No C/C/E, +pedal pulses Skin: No rashes, lesions or ulcers Psychiatry: Unable to assess given current mental state    Data Reviewed: I have personally reviewed following labs and imaging studies  CBC: Recent Labs  Lab 11/09/17 1439 11/10/17 0556  WBC 5.6 4.8  NEUTROABS 4.5  --   HGB 11.1* 10.8*  HCT 33.6* 33.0*  MCV 89.4 90.2  PLT 168 277   Basic Metabolic Panel: Recent Labs  Lab 11/09/17 1439 11/10/17 0556  NA 140 141  K 4.0 3.4*  CL 107 107  CO2 22 25  GLUCOSE 112* 52*  BUN 37* 31*  CREATININE 1.89* 1.54*  CALCIUM 9.4 9.0   GFR: Estimated Creatinine Clearance: 30.5 mL/min (A) (by C-G formula based on SCr of 1.54 mg/dL (H)). Liver Function Tests: Recent Labs  Lab 11/09/17 1439  AST 24  ALT 15*  ALKPHOS 82  BILITOT 2.1*  PROT 6.6  ALBUMIN 3.6   No results for input(s): LIPASE, AMYLASE in the last 168 hours. No results for input(s): AMMONIA in the last 168 hours. Coagulation Profile: No results for input(s): INR, PROTIME in the last 168 hours. Cardiac Enzymes: Recent Labs  Lab 11/09/17 1439 11/09/17 1718 11/09/17 2203 11/10/17 0556  TROPONINI 0.04* 0.04* 0.05* 0.05*   BNP (last 3 results) No results for input(s): PROBNP in the last 8760 hours. HbA1C: No results for input(s): HGBA1C in the last 72 hours. CBG: Recent Labs  Lab 11/09/17 1437  GLUCAP 103*   Lipid Profile: No results for input(s): CHOL, HDL, LDLCALC, TRIG, CHOLHDL, LDLDIRECT in the last 72  hours. Thyroid Function Tests: No results for input(s): TSH, T4TOTAL, FREET4, T3FREE, THYROIDAB in the last 72 hours. Anemia Panel: No results for input(s): VITAMINB12, FOLATE, FERRITIN, TIBC, IRON, RETICCTPCT in the last 72 hours. Urine analysis:    Component Value Date/Time   COLORURINE YELLOW 11/09/2017 1600   APPEARANCEUR CLEAR 11/09/2017 1600   LABSPEC 1.012 11/09/2017 1600   PHURINE 5.0 11/09/2017 1600   GLUCOSEU NEGATIVE 11/09/2017 1600   HGBUR NEGATIVE 11/09/2017 1600   BILIRUBINUR NEGATIVE 11/09/2017 1600   KETONESUR NEGATIVE 11/09/2017 1600   PROTEINUR NEGATIVE 11/09/2017 1600   NITRITE NEGATIVE 11/09/2017 1600   LEUKOCYTESUR NEGATIVE 11/09/2017 1600   Sepsis Labs: @LABRCNTIP (procalcitonin:4,lacticidven:4)  )No results found for this or any previous visit (from the past 240 hour(s)).       Radiology Studies: Dg Chest 2 View  Result Date: 11/09/2017 CLINICAL DATA:  84 year old male with weakness, dementia. EXAM: CHEST - 2 VIEW COMPARISON:  Chest radiographs 09/26/2004. FINDINGS: Upright AP and lateral views  of the chest. New/progressed cardiomegaly since 2006. Chronic left chest dual lead cardiac pacemaker and sequelae of CABG again noted. No pneumothorax, pulmonary edema, pleural effusion or confluent pulmonary opacity. Osteopenia. No acute osseous abnormality identified. Calcified abdominal aortic atherosclerosis. Negative visible bowel gas pattern. IMPRESSION: 1. No acute cardiopulmonary abnormality. 2.  Aortic Atherosclerosis (ICD10-I70.0).  Cardiomegaly. Electronically Signed   By: Genevie Ann M.D.   On: 11/09/2017 18:01   Ct Head Wo Contrast  Result Date: 11/09/2017 CLINICAL DATA:  Worsening dementia, decreased appetite, weight loss EXAM: CT HEAD WITHOUT CONTRAST CT CERVICAL SPINE WITHOUT CONTRAST TECHNIQUE: Multidetector CT imaging of the head and cervical spine was performed following the standard protocol without intravenous contrast. Multiplanar CT image  reconstructions of the cervical spine were also generated. COMPARISON:  None. FINDINGS: CT HEAD FINDINGS Brain: No evidence of acute infarction, hemorrhage, hydrocephalus, extra-axial collection or mass lesion/mass effect. Global cortical atrophy. Subcortical white matter and periventricular small vessel ischemic changes. Vascular: Intracranial atherosclerosis. Skull: Normal. Negative for fracture or focal lesion. Sinuses/Orbits: The visualized paranasal sinuses are essentially clear. The mastoid air cells are unopacified. Other: None. CT CERVICAL SPINE FINDINGS Alignment: Normal cervical lordosis. Skull base and vertebrae: No acute fracture. No primary bone lesion or focal pathologic process. Soft tissues and spinal canal: No prevertebral fluid or swelling. No visible canal hematoma. Disc levels:  Mild degenerative changes, most prominent at C3-4. Spinal canal is patent. Upper chest: Visualized lung apices are clear. Other: Visualized thyroid is unremarkable. IMPRESSION: No evidence of acute intracranial abnormality. Atrophy with small vessel ischemic changes. No evidence of traumatic injury to the cervical spine. Mild degenerative changes. Electronically Signed   By: Julian Hy M.D.   On: 11/09/2017 15:03   Ct Cervical Spine Wo Contrast  Result Date: 11/09/2017 CLINICAL DATA:  Worsening dementia, decreased appetite, weight loss EXAM: CT HEAD WITHOUT CONTRAST CT CERVICAL SPINE WITHOUT CONTRAST TECHNIQUE: Multidetector CT imaging of the head and cervical spine was performed following the standard protocol without intravenous contrast. Multiplanar CT image reconstructions of the cervical spine were also generated. COMPARISON:  None. FINDINGS: CT HEAD FINDINGS Brain: No evidence of acute infarction, hemorrhage, hydrocephalus, extra-axial collection or mass lesion/mass effect. Global cortical atrophy. Subcortical white matter and periventricular small vessel ischemic changes. Vascular: Intracranial  atherosclerosis. Skull: Normal. Negative for fracture or focal lesion. Sinuses/Orbits: The visualized paranasal sinuses are essentially clear. The mastoid air cells are unopacified. Other: None. CT CERVICAL SPINE FINDINGS Alignment: Normal cervical lordosis. Skull base and vertebrae: No acute fracture. No primary bone lesion or focal pathologic process. Soft tissues and spinal canal: No prevertebral fluid or swelling. No visible canal hematoma. Disc levels:  Mild degenerative changes, most prominent at C3-4. Spinal canal is patent. Upper chest: Visualized lung apices are clear. Other: Visualized thyroid is unremarkable. IMPRESSION: No evidence of acute intracranial abnormality. Atrophy with small vessel ischemic changes. No evidence of traumatic injury to the cervical spine. Mild degenerative changes. Electronically Signed   By: Julian Hy M.D.   On: 11/09/2017 15:03        Scheduled Meds: . atorvastatin  40 mg Oral q1800  . dabigatran  150 mg Oral BID  . feeding supplement (ENSURE ENLIVE)  237 mL Oral BID BM   Continuous Infusions:   LOS: 0 days    Time spent: 25 minutes.     Lelon Frohlich, MD [facility] Pager [phone]  If 7PM-7AM, please contact night-coverage [email or web address] Password [email or web address] 11/10/2017, 4:06 PM

## 2017-11-10 NOTE — Progress Notes (Signed)
Initial Nutrition Assessment  DOCUMENTATION CODES:  Not applicable  INTERVENTION:  Note patient has had extreme weight loss, with loss of 37 lbs (20% bw) since just last August  Would continue providing supplements as patient accepts, if patient fails to progress, would recommend palliative consult. Would not recommend PEG.   NUTRITION DIAGNOSIS:  Inadequate oral intake related to poor appetite, chronic illness(Dementia) as evidenced by loss of 20% bw (37 lbs) x 9 months  GOAL:  Patient will meet greater than or equal to 90% of their needs  MONITOR:  PO intake, Supplement acceptance, Labs, Weight trends, I & O's, Goals of care  REASON FOR ASSESSMENT:  Malnutrition Screening Tool    ASSESSMENT:  84 y/o male pmhx dementia, CKD, Pacemaker, Afib, HTN/HLD, CAD s/p CABG, DM2. Presented to ED from PCP office due to FTT; has had poor intake of solids/liquids for 3 months. Found to be dehydrated w/ AKI and admitted for management.   RD operating remotely on weekends. Patient chart reviewed due to +MST report, which indicated the patient has not been eating well due to a poor appetite and has also had significant unintentional weight loss.   Patient is noted to have dementia, though see that at last Cardiology appointment last August, patient was noted to be alert/oriented w/o any mention of neuro deficits??. Since that appointment on 02/15/2017, at which time he weighed 183 lbs, he has lost 37 lbs or 20% of his body weight.   At this time, patient has a diet order, but no documented intake as of yet. Pending patient response to fluid resuscitation, would recommend palliative care consult if patient continues to have poor PO intake. Would not recommend PEG. Would continue oral supplements in event patient does begin to eat.   Patient will likely meet severe malnutrition criteria once interview and physical exam can be conducted  Physical Exam: Unable to assess  Labs: BUN/Creat: 31/1.54  (improved from 37/1.89), BG 52, K:3.4 Meds: Ensure, IVF   Recent Labs  Lab 11/09/17 1439 11/10/17 0556  NA 140 141  K 4.0 3.4*  CL 107 107  CO2 22 25  BUN 37* 31*  CREATININE 1.89* 1.54*  CALCIUM 9.4 9.0  GLUCOSE 112* 52*   NUTRITION - FOCUSED PHYSICAL EXAM: Unable to assess   Diet Order:   Diet Order           Diet 2 gram sodium Room service appropriate? Yes; Fluid consistency: Thin  Diet effective now         EDUCATION NEEDS:  No education needs have been identified at this time  Skin:  Skin tear to R shoulder  Last BM:  5/2  Height:  Ht Readings from Last 1 Encounters:  11/09/17 5\' 6"  (1.676 m)   Weight:  Wt Readings from Last 1 Encounters:  11/10/17 147 lb 14.9 oz (67.1 kg)   Wt Readings from Last 10 Encounters:  11/10/17 147 lb 14.9 oz (67.1 kg)  02/15/17 183 lb (83 kg)  02/16/16 179 lb 6.4 oz (81.4 kg)  02/16/15 181 lb 3.2 oz (82.2 kg)  12/24/13 182 lb (82.6 kg)  12/24/12 191 lb (86.6 kg)  06/25/12 188 lb (85.3 kg)  05/23/12 188 lb (85.3 kg)  05/10/12 191 lb (86.6 kg)  04/20/11 191 lb (86.6 kg)   Ideal Body Weight:  64.55 kg  BMI:  Body mass index is 23.88 kg/m.  Estimated Nutritional Needs:  Kcal:  1750-2000 (26-30 kcal/kg bw) Protein:  80-95g/kg bw (1.2-1.4 g/kg bw) Fluid:  >  1.7 L fluid (25 ml/kcal)   Burtis Junes RD, LDN, CNSC Clinical Nutrition Available Tues-Sat via Pager: [phone] 11/10/2017 9:23 AM

## 2017-11-11 LAB — VITAMIN B12: VITAMIN B 12: 442 pg/mL (ref 180–914)

## 2017-11-11 MED ORDER — DABIGATRAN ETEXILATE MESYLATE 75 MG PO CAPS: 75.0000 mg | ORAL_CAPSULE | Freq: Two times a day (BID) | ORAL | 2 refills | Status: DC

## 2017-11-11 NOTE — Progress Notes (Signed)
IV removed, WNL. D/C instructions given to pt and family. Verbalized understanding. Pt to be dressed, then family members to transport home.

## 2017-11-11 NOTE — Progress Notes (Signed)
PT Cancellation Note  Patient Details Name: Neil [name] MRN: [id number] DOB: Apr 12, 1929   Cancelled Treatment:    Reason Eval/Treat Not Completed: Other (comment)(Therapist attempted for 25 minutes pt refuses treatment.) Rayetta Humphrey, PT CLT [phone] 11/11/2017, 9:26 AM

## 2017-11-11 NOTE — Discharge Summary (Signed)
Physician Discharge Summary  Neil [name] [id number] DOB: 05/26/1929 DOA: 11/09/2017  PCP: Redmond School, MD  Admit date: 11/09/2017 Discharge date: 11/11/2017  Time spent: 45 minutes  Recommendations for Outpatient Follow-up:  -To be discharged home today. -Would recommend continuing discussions with PCP regarding CODE STATUS and likely eventual need for SNF placement.  Discharge Diagnoses:  Principal Problem:   Dehydration Active Problems:   Permanent atrial fibrillation (HCC)   HYPERTENSION, BENIGN   Pacemaker-[employer]   FTT (failure to thrive) in adult   AKI (acute kidney injury) (HCC)   Dementia   Renal artery stenosis (HCC)   CKD (chronic kidney disease) stage 3, GFR 30-59 ml/min (HCC)   Discharge Condition: Stable and improved  Filed Weights   11/09/17 1919 11/10/17 0559 11/11/17 0449  Weight: 66 kg (145 lb 8.1 oz) 67.1 kg (147 lb 14.9 oz) 66.7 kg (147 lb 0.8 oz)    History of present illness:  As per Dr. Shanon Brow on 5/3:  Neil [name] is a 84 y.o. male with medical history significant of dementia, chronic kidney disease, pacemaker, A. fib, hypertension, diabetes brought in by family members today because of a recent fall they went to see his primary care physician is into the emergency department for failure to thrive.  Patient has not been eating or drinking very well for several months.  His wife reports that for at least 3 months he has had a significant decline where he is not eating as well he still ambulating with a walker.  Wife reports no fevers.  He has had no nausea vomiting or diarrhea.  He complains of no pain.  Patient found to be dehydrated with acute kidney injury and referred for admission for such.    Hospital Course:   Adult failure to thrive/generalized weakness -Suspect this is natural progression of his dementia. -He would certainly be appropriate for palliative care consultation but not available over the weekend. -Has received  gentle IVF in consideration of his severe systolic CHF. -Certainly his systolic heart failure, which appears worsened as per comparison with echo in 2013, could be playing a role in his failure to thrive. -Wife understands progressive nature of dementia. Have encouraged her to continue code status discussions with PCP as well as possible eventual need for SNF placement.  Chronic systolic heart failure -Echo shows an ejection fraction of 15 to 20% with diffuse hypokinesis and not technically sufficient to allow evaluation of LV diastolic function.  Per echo in 2013 his EF was 20 to 30%. -Cannot do ACE inhibitor/ARB/ARNI due to renal dysfunction. -We have started Coreg 6.25 mg twice daily this admissionwith goal dose of 25 mg twice daily if blood pressure and heart rate tolerate. -Continue statin. -Consider addition of spironolactone once beta-blocker has been maximally titrated if blood pressure allows. -Further titration of medications can be accomplished in the OP setting.  Atrial fibrillation -Rate controlled.   -Anticoagulated on Pradaxa, pharmacy has recommended a dose reduction due to his Cr clearance.  Elevated troponin -Flat in the range of 0.04-0.05, unable to interpret EKG as it is a paced rhythm -Likely due to profound systolic heart failure, no further cardiac work-up planned for at present.  Acute on chronic kidney disease stage III -Baseline creatinine appears to be around 1.5. -Was 1.89 and admission is down to 1.54 on DC which is his baseline, after IV fluids.     Procedures:  None   Consultations:  None  Discharge Instructions  Discharge Instructions  Diet - low sodium heart healthy   Complete by:  As directed    Increase activity slowly   Complete by:  As directed      Allergies as of 11/11/2017      Reactions   Penicillins Swelling, Rash   Itching Has patient had a PCN reaction causing immediate rash, facial/tongue/throat swelling, SOB or  lightheadedness with hypotension: Yes Has patient had a PCN reaction causing severe rash involving mucus membranes or skin necrosis: Yes Has patient had a PCN reaction that required hospitalization: No Has patient had a PCN reaction occurring within the last 10 years: No If all of the above answers are "NO", then may proceed with Cephalosporin use.      Medication List    STOP taking these medications   furosemide 20 MG tablet Commonly known as:  LASIX     TAKE these medications   atorvastatin 40 MG tablet Commonly known as:  LIPITOR Take 40 mg by mouth daily.   carvedilol 6.25 MG tablet Commonly known as:  COREG Take 6.25 mg by mouth 2 (two) times daily with a meal.   dabigatran 75 MG Caps capsule Commonly known as:  PRADAXA Take 1 capsule (75 mg total) by mouth every 12 (twelve) hours. What changed:    medication strength  how much to take  when to take this   glipiZIDE 5 MG tablet Commonly known as:  GLUCOTROL Take 10 mg by mouth 2 (two) times daily before a meal.   olmesartan 20 MG tablet Commonly known as:  BENICAR Take 1 tablet (20 mg total) by mouth daily.      Allergies  Allergen Reactions  . Penicillins Swelling and Rash    Itching Has patient had a PCN reaction causing immediate rash, facial/tongue/throat swelling, SOB or lightheadedness with hypotension: Yes Has patient had a PCN reaction causing severe rash involving mucus membranes or skin necrosis: Yes Has patient had a PCN reaction that required hospitalization: No Has patient had a PCN reaction occurring within the last 10 years: No If all of the above answers are "NO", then may proceed with Cephalosporin use.   Follow-up Information    Redmond School, MD. Schedule an appointment as soon as possible for a visit in 2 week(s).   Specialty:  Internal Medicine Contact information: [address] [place] [address] [phone]            The results of significant diagnostics  from this hospitalization (including imaging, microbiology, ancillary and laboratory) are listed below for reference.    Significant Diagnostic Studies: Dg Chest 2 View  Result Date: 11/09/2017 CLINICAL DATA:  84 year old male with weakness, dementia. EXAM: CHEST - 2 VIEW COMPARISON:  Chest radiographs 09/26/2004. FINDINGS: Upright AP and lateral views of the chest. New/progressed cardiomegaly since 2006. Chronic left chest dual lead cardiac pacemaker and sequelae of CABG again noted. No pneumothorax, pulmonary edema, pleural effusion or confluent pulmonary opacity. Osteopenia. No acute osseous abnormality identified. Calcified abdominal aortic atherosclerosis. Negative visible bowel gas pattern. IMPRESSION: 1. No acute cardiopulmonary abnormality. 2.  Aortic Atherosclerosis (ICD10-I70.0).  Cardiomegaly. Electronically Signed   By: Genevie Ann M.D.   On: 11/09/2017 18:01   Ct Head Wo Contrast  Result Date: 11/09/2017 CLINICAL DATA:  Worsening dementia, decreased appetite, weight loss EXAM: CT HEAD WITHOUT CONTRAST CT CERVICAL SPINE WITHOUT CONTRAST TECHNIQUE: Multidetector CT imaging of the head and cervical spine was performed following the standard protocol without intravenous contrast. Multiplanar CT image reconstructions of the cervical spine  were also generated. COMPARISON:  None. FINDINGS: CT HEAD FINDINGS Brain: No evidence of acute infarction, hemorrhage, hydrocephalus, extra-axial collection or mass lesion/mass effect. Global cortical atrophy. Subcortical white matter and periventricular small vessel ischemic changes. Vascular: Intracranial atherosclerosis. Skull: Normal. Negative for fracture or focal lesion. Sinuses/Orbits: The visualized paranasal sinuses are essentially clear. The mastoid air cells are unopacified. Other: None. CT CERVICAL SPINE FINDINGS Alignment: Normal cervical lordosis. Skull base and vertebrae: No acute fracture. No primary bone lesion or focal pathologic process. Soft tissues  and spinal canal: No prevertebral fluid or swelling. No visible canal hematoma. Disc levels:  Mild degenerative changes, most prominent at C3-4. Spinal canal is patent. Upper chest: Visualized lung apices are clear. Other: Visualized thyroid is unremarkable. IMPRESSION: No evidence of acute intracranial abnormality. Atrophy with small vessel ischemic changes. No evidence of traumatic injury to the cervical spine. Mild degenerative changes. Electronically Signed   By: Julian Hy M.D.   On: 11/09/2017 15:03   Ct Cervical Spine Wo Contrast  Result Date: 11/09/2017 CLINICAL DATA:  Worsening dementia, decreased appetite, weight loss EXAM: CT HEAD WITHOUT CONTRAST CT CERVICAL SPINE WITHOUT CONTRAST TECHNIQUE: Multidetector CT imaging of the head and cervical spine was performed following the standard protocol without intravenous contrast. Multiplanar CT image reconstructions of the cervical spine were also generated. COMPARISON:  None. FINDINGS: CT HEAD FINDINGS Brain: No evidence of acute infarction, hemorrhage, hydrocephalus, extra-axial collection or mass lesion/mass effect. Global cortical atrophy. Subcortical white matter and periventricular small vessel ischemic changes. Vascular: Intracranial atherosclerosis. Skull: Normal. Negative for fracture or focal lesion. Sinuses/Orbits: The visualized paranasal sinuses are essentially clear. The mastoid air cells are unopacified. Other: None. CT CERVICAL SPINE FINDINGS Alignment: Normal cervical lordosis. Skull base and vertebrae: No acute fracture. No primary bone lesion or focal pathologic process. Soft tissues and spinal canal: No prevertebral fluid or swelling. No visible canal hematoma. Disc levels:  Mild degenerative changes, most prominent at C3-4. Spinal canal is patent. Upper chest: Visualized lung apices are clear. Other: Visualized thyroid is unremarkable. IMPRESSION: No evidence of acute intracranial abnormality. Atrophy with small vessel ischemic  changes. No evidence of traumatic injury to the cervical spine. Mild degenerative changes. Electronically Signed   By: Julian Hy M.D.   On: 11/09/2017 15:03    Microbiology: No results found for this or any previous visit (from the past 240 hour(s)).   Labs: Basic Metabolic Panel: Recent Labs  Lab 11/09/17 1439 11/10/17 0556  NA 140 141  K 4.0 3.4*  CL 107 107  CO2 22 25  GLUCOSE 112* 52*  BUN 37* 31*  CREATININE 1.89* 1.54*  CALCIUM 9.4 9.0   Liver Function Tests: Recent Labs  Lab 11/09/17 1439  AST 24  ALT 15*  ALKPHOS 82  BILITOT 2.1*  PROT 6.6  ALBUMIN 3.6   No results for input(s): LIPASE, AMYLASE in the last 168 hours. No results for input(s): AMMONIA in the last 168 hours. CBC: Recent Labs  Lab 11/09/17 1439 11/10/17 0556  WBC 5.6 4.8  NEUTROABS 4.5  --   HGB 11.1* 10.8*  HCT 33.6* 33.0*  MCV 89.4 90.2  PLT 168 151   Cardiac Enzymes: Recent Labs  Lab 11/09/17 1439 11/09/17 1718 11/09/17 2203 11/10/17 0556  TROPONINI 0.04* 0.04* 0.05* 0.05*   BNP: BNP (last 3 results) No results for input(s): BNP in the last 8760 hours.  ProBNP (last 3 results) No results for input(s): PROBNP in the last 8760 hours.  CBG: Recent Labs  Lab  11/09/17 1437  GLUCAP 103*       Signed:  [facility] Pager: [phone] 11/11/2017, 12:36 PM

## 2017-11-15 DIAGNOSIS — M542 Cervicalgia: Secondary | ICD-10-CM | POA: Diagnosis not present

## 2017-11-15 DIAGNOSIS — F0390 Unspecified dementia without behavioral disturbance: Secondary | ICD-10-CM | POA: Diagnosis not present

## 2017-11-15 DIAGNOSIS — I5042 Chronic combined systolic (congestive) and diastolic (congestive) heart failure: Secondary | ICD-10-CM | POA: Diagnosis not present

## 2017-11-15 DIAGNOSIS — R4182 Altered mental status, unspecified: Secondary | ICD-10-CM | POA: Diagnosis not present

## 2017-11-15 DIAGNOSIS — E860 Dehydration: Secondary | ICD-10-CM | POA: Diagnosis not present

## 2017-11-15 DIAGNOSIS — Z681 Body mass index (BMI) 19 or less, adult: Secondary | ICD-10-CM | POA: Diagnosis not present

## 2017-11-15 DIAGNOSIS — N179 Acute kidney failure, unspecified: Secondary | ICD-10-CM | POA: Diagnosis not present

## 2017-11-21 DIAGNOSIS — E1140 Type 2 diabetes mellitus with diabetic neuropathy, unspecified: Secondary | ICD-10-CM | POA: Diagnosis not present

## 2017-11-21 DIAGNOSIS — E1151 Type 2 diabetes mellitus with diabetic peripheral angiopathy without gangrene: Secondary | ICD-10-CM | POA: Diagnosis not present

## 2017-12-11 ENCOUNTER — Telehealth: Payer: Self-pay | Admitting: Cardiology

## 2017-12-11 NOTE — Telephone Encounter (Signed)
LMOVM for pt to return call. Received a faxed from [facility] that pt has not had a home test completed since 07-2017.

## 2017-12-14 NOTE — Telephone Encounter (Signed)
LMOVM for pt to return call.  Pt has not completed a TTM check since 07/20/2017. Patient needs to call [facility] at [phone] to get his PPM tested.

## 2017-12-17 ENCOUNTER — Encounter (HOSPITAL_COMMUNITY): Payer: Self-pay | Admitting: Emergency Medicine

## 2017-12-17 ENCOUNTER — Emergency Department (HOSPITAL_COMMUNITY): Payer: Medicare Other

## 2017-12-17 ENCOUNTER — Observation Stay (HOSPITAL_COMMUNITY)
Admission: EM | Admit: 2017-12-17 | Discharge: 2017-12-18 | Disposition: A | Discharge status: Home / Self Care | Payer: Medicare Other | Attending: Internal Medicine | Admitting: Internal Medicine

## 2017-12-17 ENCOUNTER — Other Ambulatory Visit: Payer: Self-pay

## 2017-12-17 DIAGNOSIS — I255 Ischemic cardiomyopathy: Secondary | ICD-10-CM | POA: Diagnosis present

## 2017-12-17 DIAGNOSIS — R627 Adult failure to thrive: Secondary | ICD-10-CM | POA: Diagnosis present

## 2017-12-17 DIAGNOSIS — F0390 Unspecified dementia without behavioral disturbance: Secondary | ICD-10-CM | POA: Diagnosis not present

## 2017-12-17 DIAGNOSIS — E11649 Type 2 diabetes mellitus with hypoglycemia without coma: Secondary | ICD-10-CM | POA: Diagnosis not present

## 2017-12-17 DIAGNOSIS — N183 Chronic kidney disease, stage 3 unspecified: Secondary | ICD-10-CM | POA: Diagnosis present

## 2017-12-17 DIAGNOSIS — Z79899 Other long term (current) drug therapy: Secondary | ICD-10-CM | POA: Diagnosis not present

## 2017-12-17 DIAGNOSIS — R29898 Other symptoms and signs involving the musculoskeletal system: Secondary | ICD-10-CM

## 2017-12-17 DIAGNOSIS — M6281 Muscle weakness (generalized): Secondary | ICD-10-CM | POA: Diagnosis not present

## 2017-12-17 DIAGNOSIS — S3993XA Unspecified injury of pelvis, initial encounter: Secondary | ICD-10-CM | POA: Diagnosis not present

## 2017-12-17 DIAGNOSIS — E860 Dehydration: Secondary | ICD-10-CM | POA: Diagnosis not present

## 2017-12-17 DIAGNOSIS — I129 Hypertensive chronic kidney disease with stage 1 through stage 4 chronic kidney disease, or unspecified chronic kidney disease: Secondary | ICD-10-CM | POA: Diagnosis not present

## 2017-12-17 DIAGNOSIS — R102 Pelvic and perineal pain: Secondary | ICD-10-CM | POA: Diagnosis not present

## 2017-12-17 DIAGNOSIS — R531 Weakness: Secondary | ICD-10-CM | POA: Diagnosis not present

## 2017-12-17 DIAGNOSIS — R4182 Altered mental status, unspecified: Secondary | ICD-10-CM | POA: Diagnosis present

## 2017-12-17 DIAGNOSIS — E43 Unspecified severe protein-calorie malnutrition: Secondary | ICD-10-CM

## 2017-12-17 DIAGNOSIS — Z7901 Long term (current) use of anticoagulants: Secondary | ICD-10-CM | POA: Insufficient documentation

## 2017-12-17 DIAGNOSIS — I4821 Permanent atrial fibrillation: Secondary | ICD-10-CM | POA: Diagnosis present

## 2017-12-17 DIAGNOSIS — R4020 Unspecified coma: Secondary | ICD-10-CM | POA: Diagnosis not present

## 2017-12-17 DIAGNOSIS — E162 Hypoglycemia, unspecified: Secondary | ICD-10-CM

## 2017-12-17 DIAGNOSIS — R410 Disorientation, unspecified: Secondary | ICD-10-CM

## 2017-12-17 DIAGNOSIS — I482 Chronic atrial fibrillation: Secondary | ICD-10-CM | POA: Diagnosis not present

## 2017-12-17 DIAGNOSIS — E876 Hypokalemia: Secondary | ICD-10-CM | POA: Diagnosis not present

## 2017-12-17 DIAGNOSIS — W19XXXA Unspecified fall, initial encounter: Secondary | ICD-10-CM | POA: Diagnosis not present

## 2017-12-17 DIAGNOSIS — R402441 Other coma, without documented Glasgow coma scale score, or with partial score reported, in the field [EMT or ambulance]: Secondary | ICD-10-CM | POA: Diagnosis not present

## 2017-12-17 DIAGNOSIS — R079 Chest pain, unspecified: Secondary | ICD-10-CM | POA: Diagnosis not present

## 2017-12-17 LAB — CBC WITH DIFFERENTIAL/PLATELET
Basophils Absolute: 0 10*3/uL (ref 0.0–0.1)
Basophils Relative: 0 %
Eosinophils Absolute: 0.1 10*3/uL (ref 0.0–0.7)
Eosinophils Relative: 2 %
HEMATOCRIT: 36.8 % — AB (ref 39.0–52.0)
HEMOGLOBIN: 12 g/dL — AB (ref 13.0–17.0)
LYMPHS ABS: 0.8 10*3/uL (ref 0.7–4.0)
Lymphocytes Relative: 13 %
MCH: 29.3 pg (ref 26.0–34.0)
MCHC: 32.6 g/dL (ref 30.0–36.0)
MCV: 90 fL (ref 78.0–100.0)
MONO ABS: 0.3 10*3/uL (ref 0.1–1.0)
MONOS PCT: 6 %
NEUTROS ABS: 4.7 10*3/uL (ref 1.7–7.7)
NEUTROS PCT: 79 %
Platelets: 155 10*3/uL (ref 150–400)
RBC: 4.09 MIL/uL — ABNORMAL LOW (ref 4.22–5.81)
RDW: 14.6 % (ref 11.5–15.5)
WBC: 5.9 10*3/uL (ref 4.0–10.5)

## 2017-12-17 LAB — COMPREHENSIVE METABOLIC PANEL
ALT: 19 U/L (ref 17–63)
AST: 28 U/L (ref 15–41)
Albumin: 3.3 g/dL — ABNORMAL LOW (ref 3.5–5.0)
Alkaline Phosphatase: 87 U/L (ref 38–126)
Anion gap: 10 (ref 5–15)
BUN: 20 mg/dL (ref 6–20)
CHLORIDE: 108 mmol/L (ref 101–111)
CO2: 26 mmol/L (ref 22–32)
Calcium: 9.1 mg/dL (ref 8.9–10.3)
Creatinine, Ser: 1.06 mg/dL (ref 0.61–1.24)
GFR calc Af Amer: >60 mL/min (ref >60)
Glucose, Bld: 79 mg/dL (ref 65–99)
POTASSIUM: 3.1 mmol/L — AB (ref 3.5–5.1)
SODIUM: 144 mmol/L (ref 135–145)
Total Bilirubin: 2.4 mg/dL — ABNORMAL HIGH (ref 0.3–1.2)
Total Protein: 6.4 g/dL — ABNORMAL LOW (ref 6.5–8.1)

## 2017-12-17 LAB — MAGNESIUM: Magnesium: 1.8 mg/dL (ref 1.7–2.4)

## 2017-12-17 LAB — GLUCOSE, CAPILLARY: GLUCOSE-CAPILLARY: 102 mg/dL — AB (ref 65–99)

## 2017-12-17 LAB — CBC
HEMATOCRIT: 41.5 % (ref 39.0–52.0)
Hemoglobin: 13.6 g/dL (ref 13.0–17.0)
MCH: 29.7 pg (ref 26.0–34.0)
MCHC: 32.8 g/dL (ref 30.0–36.0)
MCV: 90.6 fL (ref 78.0–100.0)
Platelets: 178 10*3/uL (ref 150–400)
RBC: 4.58 MIL/uL (ref 4.22–5.81)
RDW: 14.7 % (ref 11.5–15.5)
WBC: 6.5 10*3/uL (ref 4.0–10.5)

## 2017-12-17 LAB — TSH: TSH: 4.229 u[IU]/mL (ref 0.350–4.500)

## 2017-12-17 LAB — TROPONIN I: Troponin I: 0.05 ng/mL (ref <0.03)

## 2017-12-17 LAB — CBG MONITORING, ED
GLUCOSE-CAPILLARY: 112 mg/dL — AB (ref 65–99)
Glucose-Capillary: 65 mg/dL (ref 65–99)

## 2017-12-17 LAB — AMMONIA: Ammonia: <9 umol/L — ABNORMAL LOW (ref 9–35)

## 2017-12-17 MED ORDER — DEXTROSE 50 % IV SOLN
INTRAVENOUS | Status: AC
  Filled 2017-12-17: qty 50

## 2017-12-17 MED ORDER — LOSARTAN POTASSIUM 50 MG PO TABS
50.0000 mg | ORAL_TABLET | Freq: Every day | ORAL | Status: DC
  Administered 2017-12-17 – 2017-12-18 (×2): 50 mg via ORAL
  Filled 2017-12-17 (×2): qty 1

## 2017-12-17 MED ORDER — CARVEDILOL 3.125 MG PO TABS
6.2500 mg | ORAL_TABLET | Freq: Two times a day (BID) | ORAL | Status: DC
  Administered 2017-12-17 – 2017-12-18 (×2): 6.25 mg via ORAL
  Filled 2017-12-17 (×2): qty 2

## 2017-12-17 MED ORDER — ENSURE ENLIVE PO LIQD: 237.0000 mL | Freq: Two times a day (BID) | ORAL | Status: DC

## 2017-12-17 MED ORDER — POTASSIUM CHLORIDE 10 MEQ/100ML IV SOLN
10.0000 meq | Freq: Once | INTRAVENOUS | Status: AC
  Administered 2017-12-17: 10 meq via INTRAVENOUS
  Filled 2017-12-17: qty 100

## 2017-12-17 MED ORDER — DABIGATRAN ETEXILATE MESYLATE 75 MG PO CAPS
75.0000 mg | ORAL_CAPSULE | Freq: Two times a day (BID) | ORAL | Status: DC
  Administered 2017-12-17 – 2017-12-18 (×2): 75 mg via ORAL
  Filled 2017-12-17 (×2): qty 1

## 2017-12-17 MED ORDER — POTASSIUM CHLORIDE IN NACL 40-0.9 MEQ/L-% IV SOLN
INTRAVENOUS | Status: DC
  Administered 2017-12-17 – 2017-12-18 (×2): 75 mL/h via INTRAVENOUS

## 2017-12-17 MED ORDER — CALCITRIOL 0.25 MCG PO CAPS
0.2500 ug | ORAL_CAPSULE | ORAL | Status: DC
  Administered 2017-12-17: 0.25 ug via ORAL
  Filled 2017-12-17: qty 1

## 2017-12-17 MED ORDER — DEXTROSE 50 % IV SOLN
1.0000 | Freq: Once | INTRAVENOUS | Status: AC
  Administered 2017-12-17: 50 mL via INTRAVENOUS

## 2017-12-17 MED ORDER — SODIUM CHLORIDE 0.9 % IV BOLUS
500.0000 mL | Freq: Once | INTRAVENOUS | Status: AC
  Administered 2017-12-17: 500 mL via INTRAVENOUS

## 2017-12-17 MED ORDER — ATORVASTATIN CALCIUM 40 MG PO TABS
40.0000 mg | ORAL_TABLET | Freq: Every day | ORAL | Status: DC
  Administered 2017-12-17 – 2017-12-18 (×2): 40 mg via ORAL
  Filled 2017-12-17 (×2): qty 1

## 2017-12-17 NOTE — Care Management (Signed)
CM received consult from EDP regarding pt's weakness and difficulty family is having caring for pt at home. CM spoke with wife over phone. She understands pt does not meet inpt criteria at this time and if he is kept over night it would be OBS. She understands medicare requires inpt admission of 3 midnights to qualify for SNF. Medicare would not pay for placement at this time and pt would have to pay OOP. Option of [name] services offered. Wife would like to discuss with her son. CM will see pt in AM if he is kept overnight or will call at home tomorrow if [place] with [facility] services.

## 2017-12-17 NOTE — H&P (Signed)
History and Physical    LUCION DILGER EXN:[id number] DOB: [birth year] DOA: 12/17/2017  PCP: Redmond School, MD  Patient coming from:  home  Chief Complaint:   Not eating  HPI: Neil [name] is a 84 y.o. male with medical history significant of dementia, afib, ckd, FTT, dm brought in by wife due to not eating or drinking much for about a week.  Pt at baseline sometimes can walk with a walker.  He has deteriorated in the last several months to the point she can no longer care for him alone at home and is wanted SNF placement.  No fevers.  No n/v/d.  More confused than normal.  On arrival to ED his glucose was 60, on glipizide at home.  All history obtained from wife as pt unable to provide due to dementia.  Review of Systems: As per HPI otherwise 10 point review of systems negative per wife  Past Medical History:  Diagnosis Date  . Atrial fibrillation ([place])   . Chronic kidney disease (CKD), stage III (moderate) (HCC)   . Complete heart block ([place])   . Coronary artery disease    status post CABG1999  . Dementia   . Diabetes mellitus   . Dyslipidemia   . Hypertension   . Osteoarthritis   . Pacemaker    St Jude status post generator replacement 2007  . Renal artery stenosis (HCC)    95%  left, 50% right    Past Surgical History:  Procedure Laterality Date  . CORONARY ARTERY BYPASS GRAFT     with stenting  . PACEMAKER INSERTION       reports that he has never smoked. He has never used smokeless tobacco. He reports that he does not drink alcohol or use drugs.  Allergies  Allergen Reactions  . Penicillins Swelling and Rash    Itching Has patient had a PCN reaction causing immediate rash, facial/tongue/throat swelling, SOB or lightheadedness with hypotension: Yes Has patient had a PCN reaction causing severe rash involving mucus membranes or skin necrosis: Yes Has patient had a PCN reaction that required hospitalization: No Has patient had a PCN reaction occurring within  the last 10 years: No If all of the above answers are "NO", then may proceed with Cephalosporin use.    Family History  Problem Relation Age of Onset  . Other Mother   . Cancer Mother        breast  . Alzheimer's disease Sister     Prior to Admission medications   Medication Sig Start Date End Date Taking? Authorizing Provider  atorvastatin (LIPITOR) 40 MG tablet Take 40 mg by mouth daily.     Yes [provider]  calcitRIOL (ROCALTROL) 0.25 MCG capsule Take 0.25 mcg by mouth every Monday, Wednesday, and Friday.   Yes [provider]  carvedilol (COREG) 6.25 MG tablet Take 6.25 mg by mouth 2 (two) times daily with a meal.   Yes [provider]  dabigatran (PRADAXA) 75 MG CAPS capsule Take 1 capsule (75 mg total) by mouth every 12 (twelve) hours. Patient taking differently: Take 75 mg by mouth daily.  11/11/17  Yes Isaac Bliss, Rayford Halsted, MD  glipiZIDE (GLUCOTROL) 5 MG tablet Take 5 mg by mouth daily.    Yes [provider]  losartan (COZAAR) 50 MG tablet Take 50 mg by mouth daily.   Yes [provider]    Physical Exam: Vitals:   12/17/17 1412 12/17/17 1430 12/17/17 1500 12/17/17 1530  BP: Marland Kitchen)  142/73 134/69 139/72 130/79  Pulse: 74 70 69 (!) 59  Resp: (!) 24 13 13 13   Temp: 97.7 F (36.5 C)     TempSrc: Oral     SpO2: 99% 94% 95% (!) 73%  Weight:      Height:          Constitutional: NAD, calm, minimally responsive very dehydrated Vitals:   12/17/17 1412 12/17/17 1430 12/17/17 1500 12/17/17 1530  BP: (!) 142/73 134/69 139/72 130/79  Pulse: 74 70 69 (!) 59  Resp: (!) 24 13 13 13   Temp: 97.7 F (36.5 C)     TempSrc: Oral     SpO2: 99% 94% 95% (!) 73%  Weight:      Height:       Eyes: PERRL, lids and conjunctivae normal ENMT: Mucous membranes are very dry. Posterior pharynx clear of any exudate or lesions.Normal dentition.  Neck: normal, supple, no masses, no thyromegaly Respiratory: clear to auscultation bilaterally,  no wheezing, no crackles. Normal respiratory effort. No accessory muscle use.  Cardiovascular: Regular rate and rhythm, no murmurs / rubs / gallops. No extremity edema. 2+ pedal pulses. No carotid bruits.  Abdomen: no tenderness, no masses palpated. No hepatosplenomegaly. Bowel sounds positive.  Musculoskeletal: no clubbing / cyanosis. No joint deformity upper and lower extremities. Good ROM, no contractures. Normal muscle tone.  Skin: no rashes, lesions, ulcers to legs. No induration Neurologic: CN 2-12 grossly intact. Sensation intact, DTR normal. Strength 5/5 in all 4.  Psychiatric: not Normal judgment and insight. Alert and oriented x 0. Not agitated   Labs on Admission: I have personally reviewed following labs and imaging studies  CBC: Recent Labs  Lab 12/17/17 1435 12/17/17 1552  WBC 6.5 5.9  NEUTROABS  --  4.7  HGB 13.6 12.0*  HCT 41.5 36.8*  MCV 90.6 90.0  PLT 178 427   Basic Metabolic Panel: Recent Labs  Lab 12/17/17 1435  NA 144  K 3.1*  CL 108  CO2 26  GLUCOSE 79  BUN 20  CREATININE 1.06  CALCIUM 9.1   GFR: Estimated Creatinine Clearance: 47.2 mL/min (by C-G formula based on SCr of 1.06 mg/dL). Liver Function Tests: Recent Labs  Lab 12/17/17 1435  AST 28  ALT 19  ALKPHOS 87  BILITOT 2.4*  PROT 6.4*  ALBUMIN 3.3*   No results for input(s): LIPASE, AMYLASE in the last 168 hours. No results for input(s): AMMONIA in the last 168 hours. Coagulation Profile: No results for input(s): INR, PROTIME in the last 168 hours. Cardiac Enzymes: No results for input(s): CKTOTAL, CKMB, CKMBINDEX, TROPONINI in the last 168 hours. BNP (last 3 results) No results for input(s): PROBNP in the last 8760 hours. HbA1C: No results for input(s): HGBA1C in the last 72 hours. CBG: Recent Labs  Lab 12/17/17 1419 12/17/17 1601  GLUCAP 65 112*   Lipid Profile: No results for input(s): CHOL, HDL, LDLCALC, TRIG, CHOLHDL, LDLDIRECT in the last 72 hours. Thyroid Function  Tests: No results for input(s): TSH, T4TOTAL, FREET4, T3FREE, THYROIDAB in the last 72 hours. Anemia Panel: No results for input(s): VITAMINB12, FOLATE, FERRITIN, TIBC, IRON, RETICCTPCT in the last 72 hours. Urine analysis:    Component Value Date/Time   COLORURINE YELLOW 11/09/2017 1600   APPEARANCEUR CLEAR 11/09/2017 1600   LABSPEC 1.012 11/09/2017 1600   PHURINE 5.0 11/09/2017 1600   GLUCOSEU NEGATIVE 11/09/2017 1600   HGBUR NEGATIVE 11/09/2017 1600   BILIRUBINUR NEGATIVE 11/09/2017 1600   KETONESUR NEGATIVE 11/09/2017 1600   PROTEINUR  NEGATIVE 11/09/2017 1600   NITRITE NEGATIVE 11/09/2017 1600   LEUKOCYTESUR NEGATIVE 11/09/2017 1600   Sepsis Labs: !!!!!!!!!!!!!!!!!!!!!!!!!!!!!!!!!!!!!!!!!!!! @LABRCNTIP (procalcitonin:4,lacticidven:4) )No results found for this or any previous visit (from the past 240 hour(s)).   Radiological Exams on Admission: Dg Chest 2 View  Result Date: 12/17/2017 CLINICAL DATA:  Upper chest pain EXAM: CHEST - 2 VIEW COMPARISON:  11/09/2017 FINDINGS: Stable cardiomegaly and previous coronary bypass. Left subclavian pacer noted. No current CHF, edema pattern, focal pneumonia, collapse or consolidation. No large effusion or pneumothorax. Trachea is midline. Aorta is atherosclerotic. Degenerative changes of the spine. No severe compression fracture. Overall stable exam. IMPRESSION: Stable cardiomegaly without significant interval change or acute process. Atherosclerosis Electronically Signed   By: Jerilynn Mages.  Shick M.D.   On: 12/17/2017 15:37   Dg Pelvis 1-2 Views  Result Date: 12/17/2017 CLINICAL DATA:  Status post fall today.  Pain.  Initial encounter. EXAM: PELVIS - 1-2 VIEW COMPARISON:  None. FINDINGS: No acute bony or joint abnormality is seen. No focal bony lesion. Atherosclerosis noted. IMPRESSION: No acute finding. Electronically Signed   By: Inge Rise M.D.   On: 12/17/2017 15:38   Ct Head Wo Contrast  Result Date: 12/17/2017 CLINICAL DATA:  Slid off  chair to the floor today with altered level of consciousness, dementia EXAM: CT HEAD WITHOUT CONTRAST TECHNIQUE: Contiguous axial images were obtained from the base of the skull through the vertex without intravenous contrast. COMPARISON:  CT brain scan of 11/09/2017 FINDINGS: Brain: The ventricular system is diffusely prominent as are the cortical sulci consistent with diffuse atrophy. The septum remains midline in position. Moderate small vessel ischemic change throughout the periventricular white matter again is noted. No hemorrhage, mass lesion, or acute infarction is seen. Vascular: Dense calcification of the vertebral arteries is present. Skull: On bone window images, the calvarium is unremarkable. Sinuses/Orbits: The paranasal sinuses appear well pneumatized. Other: None. IMPRESSION: 1. No acute intracranial abnormality. 2. Stable atrophy and moderate small vessel ischemic change. Electronically Signed   By: Ivar Drape M.D.   On: 12/17/2017 16:04    EKG: Independently reviewed. Paced  cxr reviewed no edema or infiltrate Old chart reviewed Case discussed with dr Reather Converse in ED  Assessment/Plan 84 yo male with dementia, FTT, hypoglycemia  Principal Problem:   Dementia- suspect progression of disease.  FTT.    Active Problems:   FTT (failure to thrive) in adult- noted.  Ivf.  Hold dm meds    Hypoglycemia due to type 2 diabetes mellitus ([place])- on glipizide which will hold.  Ck q 4hours glucose and monitor.  Currently 112    Cardiomyopathy, ischemic- stable at this time    Permanent atrial fibrillation ([place])-  Rate controlled     CKD (chronic kidney disease) stage 3, GFR 30-59 ml/min (HCC)- stable at baseline    Needs placement.  CM to assess but may have to pay out of pocket.  Wife aware of this   DVT prophylaxis:  scds Code Status:  full Family Communication:  wife Disposition Plan:  Per day team Consults called:  none Admission status:  observation   Levon Penning A MD [facility]  If 7PM-7AM, please contact night-coverage [email or web address] Password [email or web address]  12/17/2017, 4:20 PM

## 2017-12-17 NOTE — ED Provider Notes (Signed)
[facility] EMERGENCY DEPARTMENT Provider Note   CSN: [id number] Arrival date & time: 12/17/17  1402     History   Chief Complaint Chief Complaint  Patient presents with  . Altered Mental Status    HPI Neil [name] is a 84 y.o. male.  Patient with history of atrial fibrillation, kidney disease, coronary artery disease, dementia, diabetes, lives in a house with his wife presents with worsening weakness and confusion.  For the past 2 days patient has not been able to help himself or sit up by himself.  Patient has been admitted recently for similar however now he is worse.  Decreased oral intake recently.  Mild general confusion.  Patient is on a blood thinner from his cardiac history.  No witnessed head injury.  Difficulty obtaining details from patient due to his mental state.  No new medications were called by his wife.     Past Medical History:  Diagnosis Date  . Atrial fibrillation ([place])   . Chronic kidney disease (CKD), stage III (moderate) (HCC)   . Complete heart block ([place])   . Coronary artery disease    status post CABG1999  . Dementia   . Diabetes mellitus   . Dyslipidemia   . Hypertension   . Osteoarthritis   . Pacemaker    St Jude status post generator replacement 2007  . Renal artery stenosis (HCC)    95%  left, 50% right    Patient Active Problem List   Diagnosis Date Noted  . Dehydration 11/09/2017  . FTT (failure to thrive) in adult 11/09/2017  . AKI (acute kidney injury) ([place]) 11/09/2017  . Dementia 11/09/2017  . Renal artery stenosis ([place])   . CKD (chronic kidney disease) stage 3, GFR 30-59 ml/min (HCC)   . Pacemaker-St. Jude 04/20/2011  . Permanent atrial fibrillation ([place]) 05/03/2010  . AV BLOCK, COMPLETE 11/02/2009  . ABDOMINAL PAIN-EPIGASTRIC 10/27/2008  . AODM 07/25/2008  . DYSLIPIDEMIA 07/25/2008  . Cardiomyopathy, ischemic 07/25/2008  . ATHEROSCLEROSIS OF RENAL ARTERY 07/25/2008  . HYPERTENSION, BENIGN 07/25/2008    Past  Surgical History:  Procedure Laterality Date  . CORONARY ARTERY BYPASS GRAFT     with stenting  . PACEMAKER INSERTION          Home Medications    Prior to Admission medications   Medication Sig Start Date End Date Taking? Authorizing Provider  atorvastatin (LIPITOR) 40 MG tablet Take 40 mg by mouth daily.      [provider]  carvedilol (COREG) 6.25 MG tablet Take 6.25 mg by mouth 2 (two) times daily with a meal.    [provider]  dabigatran (PRADAXA) 75 MG CAPS capsule Take 1 capsule (75 mg total) by mouth every 12 (twelve) hours. 11/11/17   Isaac Bliss, Rayford Halsted, MD  glipiZIDE (GLUCOTROL) 5 MG tablet Take 10 mg by mouth 2 (two) times daily before a meal.     [provider]  olmesartan (BENICAR) 20 MG tablet Take 1 tablet (20 mg total) by mouth daily. 11/14/10 06/10/19  Deboraha Sprang, MD    Family History Family History  Problem Relation Age of Onset  . Other Mother   . Cancer Mother        breast  . Alzheimer's disease Sister     Social History Social History   Tobacco Use  . Smoking status: Never Smoker  . Smokeless tobacco: Never Used  Substance Use Topics  . Alcohol use: No  . Drug use: No  Allergies   Penicillins   Review of Systems Review of Systems  Unable to perform ROS: Mental status change     Physical Exam Updated Vital Signs BP 130/79   Pulse (!) 59   Temp 97.7 F (36.5 C) (Oral)   Resp 13   Ht 5\' 11"  (1.803 m)   Wt 68 kg (150 lb)   SpO2 (!) 73%   BMI 20.92 kg/m   Physical Exam  Constitutional: He appears well-developed and well-nourished.  HENT:  Head: Normocephalic and atraumatic.  Very dry mm  Eyes: Right eye exhibits no discharge. Left eye exhibits no discharge.  Neck: Neck supple. No tracheal deviation present.  Cardiovascular: Normal rate and regular rhythm.  Pulmonary/Chest:  Decreased effort  Abdominal: Soft. He exhibits no distension. There is no tenderness. There is no guarding.    Musculoskeletal: He exhibits no edema.  Neurological: GCS eye subscore is 3. GCS verbal subscore is 4. GCS motor subscore is 6.  Difficulty appreciating patient's response due to confusion and decreased effort.  Patient moves all extremities equally with general weakness 4-5 strength bilateral.  Patient slowly touches his finger to his nose both sides.  Skin: Skin is warm. No rash noted.  Psychiatric:  confusion  Nursing note and vitals reviewed.    ED Treatments / Results  Labs (all labs ordered are listed, but only abnormal results are displayed) Labs Reviewed  COMPREHENSIVE METABOLIC PANEL - Abnormal; Notable for the following components:      Result Value   Potassium 3.1 (*)    Total Protein 6.4 (*)    Albumin 3.3 (*)    Total Bilirubin 2.4 (*)    All other components within normal limits  URINE CULTURE  CBC  CBC WITH DIFFERENTIAL/PLATELET  AMMONIA  URINALYSIS, ROUTINE W REFLEX MICROSCOPIC  TSH  MAGNESIUM  TROPONIN I  CBG MONITORING, ED  CBG MONITORING, ED    EKG EKG Interpretation  Date/Time:  Monday December 17 2017 14:10:48 EDT Ventricular Rate:  70 PR Interval:    QRS Duration: 184 QT Interval:  472 QTC Calculation: 510 R Axis:   -82 Text Interpretation:  Ventricular-paced complexes No further analysis attempted due to paced rhythm Confirmed by Elnora Morrison [phone]) on 12/17/2017 2:20:56 PM   Radiology Dg Chest 2 View  Result Date: 12/17/2017 CLINICAL DATA:  Upper chest pain EXAM: CHEST - 2 VIEW COMPARISON:  11/09/2017 FINDINGS: Stable cardiomegaly and previous coronary bypass. Left subclavian pacer noted. No current CHF, edema pattern, focal pneumonia, collapse or consolidation. No large effusion or pneumothorax. Trachea is midline. Aorta is atherosclerotic. Degenerative changes of the spine. No severe compression fracture. Overall stable exam. IMPRESSION: Stable cardiomegaly without significant interval change or acute process. Atherosclerosis Electronically  Signed   By: Jerilynn Mages.  Shick M.D.   On: 12/17/2017 15:37   Dg Pelvis 1-2 Views  Result Date: 12/17/2017 CLINICAL DATA:  Status post fall today.  Pain.  Initial encounter. EXAM: PELVIS - 1-2 VIEW COMPARISON:  None. FINDINGS: No acute bony or joint abnormality is seen. No focal bony lesion. Atherosclerosis noted. IMPRESSION: No acute finding. Electronically Signed   By: Inge Rise M.D.   On: 12/17/2017 15:38    Procedures Procedures (including critical care time)  Medications Ordered in ED Medications  sodium chloride 0.9 % bolus 500 mL (500 mLs Intravenous New Bag/Given 12/17/17 1535)  dextrose 50 % solution (has no administration in time range)  potassium chloride 10 mEq in 100 mL IVPB (has no administration in time range)  dextrose 50 % solution 50 mL (50 mLs Intravenous Given 12/17/17 1535)     Initial Impression / Assessment and Plan / ED Course  I have reviewed the triage vital signs and the nursing notes.  Pertinent labs & imaging results that were available during my care of the patient were reviewed by me and considered in my medical decision making (see chart for details).    Patient with significant deconditioning/general weakness presents with worsening confusion.  Clinically patient significantly dehydrated and generally weak on exam.  Difficulty obtaining details from the patient and wife provides some details.  Clinical concern for wife's ability to adequately care for him at home as she is also elderly, discussed with his wife that he will likely need placement for further care.  Plan for blood work, IV fluids, urinalysis, CT scan of the head with his confusion and blood thinner. .  D50 given Patient had borderline glucose.  CT scan no acute findings.  Plan to admit to hospitalist for further evaluation of worsening confusion and general weakness.  Blood work reviewed aside from borderline glucose and hypokalemia overall unremarkable.  Urinalysis pending  Case management  consulted to assist with admission versus nursing home placement.  This is patient's second visit for admission in the past 40 days.  Differential diagnosis were considered with the presenting HPI.  Medications  sodium chloride 0.9 % bolus 500 mL (500 mLs Intravenous New Bag/Given 12/17/17 1535)  dextrose 50 % solution (has no administration in time range)  potassium chloride 10 mEq in 100 mL IVPB (has no administration in time range)  dextrose 50 % solution 50 mL (50 mLs Intravenous Given 12/17/17 1535)    Vitals:   12/17/17 1412 12/17/17 1430 12/17/17 1500 12/17/17 1530  BP: (!) 142/73 134/69 139/72 130/79  Pulse: 74 70 69 (!) 59  Resp: (!) 24 13 13 13   Temp: 97.7 F (36.5 C)     TempSrc: Oral     SpO2: 99% 94% 95% (!) 73%  Weight:      Height:        Final diagnoses:  Muscular deconditioning  General weakness  Dehydration  Confusion  Hypokalemia  Hypoglycemia    Admission/ observation were discussed with the admitting physician, patient and/or family and they are comfortable with the plan.     Final Clinical Impressions(s) / ED Diagnoses   Final diagnoses:  Muscular deconditioning  General weakness  Dehydration  Confusion  Hypokalemia  Hypoglycemia    ED Discharge Orders    None       Elnora Morrison, MD 12/18/17 1652

## 2017-12-17 NOTE — Telephone Encounter (Signed)
3rd attempt   LMOVM for pt tor return call.

## 2017-12-17 NOTE — ED Triage Notes (Signed)
Per EMS, was called out for fall, states patient slid from recliner to floor. States spouse stated patient not acting normal, has dementia. Patient weak and she feels like he's dehydrated. No family at bedside to confirm.

## 2017-12-18 DIAGNOSIS — F0390 Unspecified dementia without behavioral disturbance: Secondary | ICD-10-CM

## 2017-12-18 DIAGNOSIS — M6281 Muscle weakness (generalized): Secondary | ICD-10-CM | POA: Diagnosis not present

## 2017-12-18 DIAGNOSIS — E43 Unspecified severe protein-calorie malnutrition: Secondary | ICD-10-CM

## 2017-12-18 LAB — GLUCOSE, CAPILLARY
GLUCOSE-CAPILLARY: 67 mg/dL (ref 65–99)
Glucose-Capillary: 79 mg/dL (ref 65–99)
Glucose-Capillary: 110 mg/dL — ABNORMAL HIGH (ref 65–99)
Glucose-Capillary: 96 mg/dL (ref 65–99)
Glucose-Capillary: 129 mg/dL — ABNORMAL HIGH (ref 65–99)

## 2017-12-18 LAB — BASIC METABOLIC PANEL
Anion gap: 5 (ref 5–15)
BUN: 20 mg/dL (ref 6–20)
CHLORIDE: 110 mmol/L (ref 101–111)
CO2: 28 mmol/L (ref 22–32)
CREATININE: 0.91 mg/dL (ref 0.61–1.24)
Calcium: 8.3 mg/dL — ABNORMAL LOW (ref 8.9–10.3)
GFR calc Af Amer: >60 mL/min (ref >60)
GFR calc non Af Amer: >60 mL/min (ref >60)
Glucose, Bld: 83 mg/dL (ref 65–99)
Potassium: 3.5 mmol/L (ref 3.5–5.1)
Sodium: 143 mmol/L (ref 135–145)

## 2017-12-18 LAB — CBC
HCT: 34 % — ABNORMAL LOW (ref 39.0–52.0)
Hemoglobin: 11.3 g/dL — ABNORMAL LOW (ref 13.0–17.0)
MCH: 30 pg (ref 26.0–34.0)
MCHC: 33.2 g/dL (ref 30.0–36.0)
MCV: 90.2 fL (ref 78.0–100.0)
Platelets: 169 10*3/uL (ref 150–400)
RBC: 3.77 MIL/uL — ABNORMAL LOW (ref 4.22–5.81)
RDW: 14.8 % (ref 11.5–15.5)
WBC: 6.2 10*3/uL (ref 4.0–10.5)

## 2017-12-18 MED ORDER — ADULT MULTIVITAMIN W/MINERALS CH: 1.0000 | ORAL_TABLET | Freq: Every day | ORAL | Status: DC

## 2017-12-18 MED ORDER — ENSURE ENLIVE PO LIQD: 237.0000 mL | Freq: Two times a day (BID) | ORAL | 12 refills | Status: AC

## 2017-12-18 MED ORDER — MEGESTROL ACETATE 625 MG/5ML PO SUSP: 625.0000 mg | Freq: Every day | ORAL | 0 refills | Status: DC

## 2017-12-18 NOTE — Progress Notes (Signed)
Pt discharge in stable condition via EMS into the care of family.  Condom catheter removed intact w/o S&S of complications.  PIV removed intact w/o S&S of complications.  Discharge instructions reviewed with wife/grandson.  Wife/grandson verbalized understanding.

## 2017-12-18 NOTE — Discharge Summary (Signed)
Physician Discharge Summary  Neil [name] VPX:[id number] DOB: Jan 23, 1929 DOA: 12/17/2017  PCP: Redmond School, MD  Admit date: 12/17/2017  Discharge date: 12/18/2017  Admitted From:Home  Disposition:  Home  Recommendations for Outpatient Follow-up:  1. Follow up with PCP in 1-2 weeks  Home Health:Yes  Equipment/Devices:Hospital bed, wheelchair, etc.  Discharge Condition:Stable  CODE STATUS: Full  Diet recommendation: Heart Healthy  Brief/Interim Summary:  Neil [name] is a 84 y.o. male with medical history significant of dementia, afib, ckd, FTT, dm brought in by wife due to not eating or drinking much for about a week.  Pt at baseline sometimes can walk with a walker.  He has deteriorated in the last several months to the point she can no longer care for him alone at home and they requested SNF placement.  He was noted to have some mild hypokalemia as well as low blood glucose levels and was noted to be in glipizide at home with poor oral intake which likely contributed to the process.  He has had no significant events during the course of this admission and maintains a low appetite.  He has been seen by clinical social worker and they are agreeable to going home with ongoing nursing and physical therapy care.  Home glipizide has been discontinued.  He has been placed on Megace for appetite stimulation and has been given nutritional supplementation.  He needs to follow-up with primary care physician in 1 to 2 weeks.   Discharge Diagnoses:  Principal Problem:   Dementia Active Problems:   Cardiomyopathy, ischemic   Permanent atrial fibrillation (HCC)   FTT (failure to thrive) in adult   CKD (chronic kidney disease) stage 3, GFR 30-59 ml/min (HCC)   Hypoglycemia due to type 2 diabetes mellitus (HCC)   Protein-calorie malnutrition, severe  1. Failure to thrive with progression of dementia.  Home hospital bed, wheelchair, and other arrangements have been made through home  health services at this time as he does not qualify for inpatient placement.  Blood glucose levels are currently stable and he has been given nutritional supplementation as well as Megace for appetite stimulation to help with oral intake.  We will follow-up with PCP in the outpatient setting. 2. Severe protein calorie malnutrition-secondary to above. 3. Hyperglycemia secondary to sulfonylurea use with poor oral intake.  Glipizide has been discontinued at this time due to unreliable oral intake and can be restarted in the outpatient setting should he have improved intake with hypoglycemia.  Continue to monitor closely. 4. Ischemic cardiomyopathy. 5. Permanent atrial fibrillation.  Rate control. 6. CKD stage III-stable at baseline.  Discharge Instructions  Discharge Instructions    DME Hospital bed   Complete by:  As directed    Bed type:  Semi-electric   Diet - low sodium heart healthy   Complete by:  As directed    Increase activity slowly   Complete by:  As directed      Allergies as of 12/18/2017      Reactions   Penicillins Swelling, Rash   Itching Has patient had a PCN reaction causing immediate rash, facial/tongue/throat swelling, SOB or lightheadedness with hypotension: Yes Has patient had a PCN reaction causing severe rash involving mucus membranes or skin necrosis: Yes Has patient had a PCN reaction that required hospitalization: No Has patient had a PCN reaction occurring within the last 10 years: No If all of the above answers are "NO", then may proceed with Cephalosporin use.      Medication  List    STOP taking these medications   glipiZIDE 5 MG tablet Commonly known as:  GLUCOTROL     TAKE these medications   atorvastatin 40 MG tablet Commonly known as:  LIPITOR Take 40 mg by mouth daily.   calcitRIOL 0.25 MCG capsule Commonly known as:  ROCALTROL Take 0.25 mcg by mouth every Monday, Wednesday, and Friday.   carvedilol 6.25 MG tablet Commonly known as:   COREG Take 6.25 mg by mouth 2 (two) times daily with a meal.   dabigatran 75 MG Caps capsule Commonly known as:  PRADAXA Take 1 capsule (75 mg total) by mouth every 12 (twelve) hours. What changed:  when to take this   feeding supplement (ENSURE ENLIVE) Liqd Take 237 mLs by mouth 2 (two) times daily between meals.   losartan 50 MG tablet Commonly known as:  COZAAR Take 50 mg by mouth daily.   megestrol 625 MG/5ML suspension Commonly known as:  MEGACE ES Take 5 mLs (625 mg total) by mouth daily.            Durable Medical Equipment  (From admission, onward)        Start     Ordered   12/18/17 1456  DME tub bench  Once     12/18/17 1456   12/18/17 1455  For home use only DME standard manual wheelchair with seat cushion  (Wheelchairs)  Once    Comments:  Patient suffers from debility which impairs their ability to perform daily activities like bathing in the home.  A walker will not resolve  issue with performing activities of daily living. A wheelchair will allow patient to safely perform daily activities. Patient can safely propel the wheelchair in the home or has a caregiver who can provide assistance.  Accessories: elevating leg rests (ELRs), wheel locks, extensions and anti-tippers.   12/18/17 1456   12/18/17 0000  DME Hospital bed    Question:  Bed type  Answer:  Semi-electric   12/18/17 1456     Follow-up Information    Redmond School, MD Follow up in 1 week(s).   Specialty:  Internal Medicine Contact information: [address] [place] [address] [phone]          Allergies  Allergen Reactions  . Penicillins Swelling and Rash    Itching Has patient had a PCN reaction causing immediate rash, facial/tongue/throat swelling, SOB or lightheadedness with hypotension: Yes Has patient had a PCN reaction causing severe rash involving mucus membranes or skin necrosis: Yes Has patient had a PCN reaction that required hospitalization: No Has patient  had a PCN reaction occurring within the last 10 years: No If all of the above answers are "NO", then may proceed with Cephalosporin use.    Consultations:  None   Procedures/Studies: Dg Chest 2 View  Result Date: 12/17/2017 CLINICAL DATA:  Upper chest pain EXAM: CHEST - 2 VIEW COMPARISON:  11/09/2017 FINDINGS: Stable cardiomegaly and previous coronary bypass. Left subclavian pacer noted. No current CHF, edema pattern, focal pneumonia, collapse or consolidation. No large effusion or pneumothorax. Trachea is midline. Aorta is atherosclerotic. Degenerative changes of the spine. No severe compression fracture. Overall stable exam. IMPRESSION: Stable cardiomegaly without significant interval change or acute process. Atherosclerosis Electronically Signed   By: Jerilynn Mages.  Shick M.D.   On: 12/17/2017 15:37   Dg Pelvis 1-2 Views  Result Date: 12/17/2017 CLINICAL DATA:  Status post fall today.  Pain.  Initial encounter. EXAM: PELVIS - 1-2 VIEW COMPARISON:  None. FINDINGS:  No acute bony or joint abnormality is seen. No focal bony lesion. Atherosclerosis noted. IMPRESSION: No acute finding. Electronically Signed   By: Inge Rise M.D.   On: 12/17/2017 15:38   Ct Head Wo Contrast  Result Date: 12/17/2017 CLINICAL DATA:  Slid off chair to the floor today with altered level of consciousness, dementia EXAM: CT HEAD WITHOUT CONTRAST TECHNIQUE: Contiguous axial images were obtained from the base of the skull through the vertex without intravenous contrast. COMPARISON:  CT brain scan of 11/09/2017 FINDINGS: Brain: The ventricular system is diffusely prominent as are the cortical sulci consistent with diffuse atrophy. The septum remains midline in position. Moderate small vessel ischemic change throughout the periventricular white matter again is noted. No hemorrhage, mass lesion, or acute infarction is seen. Vascular: Dense calcification of the vertebral arteries is present. Skull: On bone window images, the  calvarium is unremarkable. Sinuses/Orbits: The paranasal sinuses appear well pneumatized. Other: None. IMPRESSION: 1. No acute intracranial abnormality. 2. Stable atrophy and moderate small vessel ischemic change. Electronically Signed   By: Ivar Drape M.D.   On: 12/17/2017 16:04   Discharge Exam: Vitals:   12/18/17 0539 12/18/17 1415  BP: 129/77 121/70  Pulse: 77 77  Resp: 18 18  Temp: 97.7 F (36.5 C) 98.1 F (36.7 C)  SpO2: 96% 100%   Vitals:   12/17/17 1727 12/17/17 2101 12/18/17 0539 12/18/17 1415  BP: 129/83 106/68 129/77 121/70  Pulse: 77 69 77 77  Resp: 18 18 18 18   Temp:  (!) 97.5 F (36.4 C) 97.7 F (36.5 C) 98.1 F (36.7 C)  TempSrc:  Oral Oral   SpO2: 100% 99% 96% 100%  Weight:      Height:        General: Pt is somnolent Cardiovascular: RRR, S1/S2 +, no rubs, no gallops Respiratory: CTA bilaterally, no wheezing, no rhonchi Abdominal: Soft, NT, ND, bowel sounds + Extremities: no edema, no cyanosis    The results of significant diagnostics from this hospitalization (including imaging, microbiology, ancillary and laboratory) are listed below for reference.     Microbiology: No results found for this or any previous visit (from the past 240 hour(s)).   Labs: BNP (last 3 results) No results for input(s): BNP in the last 8760 hours. Basic Metabolic Panel: Recent Labs  Lab 12/17/17 1435 12/17/17 1552 12/18/17 0450  NA 144  --  143  K 3.1*  --  3.5  CL 108  --  110  CO2 26  --  28  GLUCOSE 79  --  83  BUN 20  --  20  CREATININE 1.06  --  0.91  CALCIUM 9.1  --  8.3*  MG  --  1.8  --    Liver Function Tests: Recent Labs  Lab 12/17/17 1435  AST 28  ALT 19  ALKPHOS 87  BILITOT 2.4*  PROT 6.4*  ALBUMIN 3.3*   No results for input(s): LIPASE, AMYLASE in the last 168 hours. Recent Labs  Lab 12/17/17 1552  AMMONIA <9*   CBC: Recent Labs  Lab 12/17/17 1435 12/17/17 1552 12/18/17 0450  WBC 6.5 5.9 6.2  NEUTROABS  --  4.7  --   HGB 13.6  12.0* 11.3*  HCT 41.5 36.8* 34.0*  MCV 90.6 90.0 90.2  PLT 178 155 169   Cardiac Enzymes: Recent Labs  Lab 12/17/17 1552  TROPONINI 0.05*   BNP: Invalid input(s): POCBNP CBG: Recent Labs  Lab 12/18/17 0210 12/18/17 0537 12/18/17 0639 12/18/17 0753 12/18/17  1136  GLUCAP 79 67 110* 96 129*   D-Dimer No results for input(s): DDIMER in the last 72 hours. Hgb A1c No results for input(s): HGBA1C in the last 72 hours. Lipid Profile No results for input(s): CHOL, HDL, LDLCALC, TRIG, CHOLHDL, LDLDIRECT in the last 72 hours. Thyroid function studies Recent Labs    12/17/17 1552  TSH 4.229   Anemia work up No results for input(s): VITAMINB12, FOLATE, FERRITIN, TIBC, IRON, RETICCTPCT in the last 72 hours. Urinalysis    Component Value Date/Time   COLORURINE YELLOW 11/09/2017 1600   APPEARANCEUR CLEAR 11/09/2017 1600   LABSPEC 1.012 11/09/2017 1600   PHURINE 5.0 11/09/2017 1600   GLUCOSEU NEGATIVE 11/09/2017 1600   HGBUR NEGATIVE 11/09/2017 1600   BILIRUBINUR NEGATIVE 11/09/2017 1600   KETONESUR NEGATIVE 11/09/2017 1600   PROTEINUR NEGATIVE 11/09/2017 1600   NITRITE NEGATIVE 11/09/2017 1600   LEUKOCYTESUR NEGATIVE 11/09/2017 1600   Sepsis Labs Invalid input(s): PROCALCITONIN,  WBC,  LACTICIDVEN Microbiology No results found for this or any previous visit (from the past 240 hour(s)).   Time coordinating discharge: 40 minutes  SIGNED:   Rodena Goldmann, DO [facility] 12/18/2017, 3:17 PM Pager [phone]  If 7PM-7AM, please contact night-coverage [email or web address] Password [email or web address]

## 2017-12-18 NOTE — Clinical Social Work Note (Signed)
Clinical Social Work Assessment  Patient Details  Name: Neil [name] MRN: [id number] Date of Birth: [birth year]  Date of referral:  12/18/17               Reason for consult:  Facility Placement                Permission sought to share information with:    Permission granted to share information::     Name::        Agency::     Relationship::     Contact Information:  Lind Covert; friend, Modena Nunnery; and Mrs. Kisiel all at bedside.   Housing/Transportation Living arrangements for the past 2 months:  [place] of Information:  Spouse, Other (Comment Required)(grandson, Edison Nasuti; family friend, [name].) Patient Interpreter Needed:  None Criminal Activity/Legal Involvement Pertinent to Current Situation/Hospitalization:  No - Comment as needed Significant Relationships:  Spouse, Other(Comment), Neighbor, Friend(Grandson, Edison Nasuti) Lives with:  Spouse Do you feel safe going back to the place where you live?  Yes Need for family participation in patient care:  Yes (Comment)  Care giving concerns:  None identified at baseline.    Social Worker assessment / plan:  Options for placement were discussed including private pay using long term care policy. SNF vs ALF was also discussed.   Family wanted to be considered for L[facility] at [facility].  [facility] does not currently have any beds available.  Family wishes to keep patient in his home environment as long as possible and determined that they want to take patient home with [facility] services. The stated that their long term care policy will also allow for 12 hour caregivers to provide care for patient.  CM made aware of family's discharge plans. LCSW signing off.   Employment status:  Retired Forensic scientist:    PT Recommendations:  [place] / Referral to community resources:  [place]  Patient/Family's Response to care:  Family has made the decision to go home.   Patient/Family's  Understanding of and Emotional Response to Diagnosis, Current Treatment, and Prognosis:  Patient's family understands his diagnosis, treatment and prognosis and would like to keep patient home in his natural environment as long as possible.    Emotional Assessment Appearance:  Appears stated age Attitude/Demeanor/Rapport:    Affect (typically observed):  Unable to Assess Orientation:  Oriented to Self Alcohol / Substance use:  Not Applicable Psych involvement (Current and /or in the community):  No (Comment)  Discharge Needs  Concerns to be addressed:  Discharge Planning Concerns Readmission within the last 30 days:  No Current discharge risk:  Chronically ill, Cognitively Impaired, Dependent with Mobility Barriers to Discharge:  No Barriers Identified   Ihor Gully, LCSW 12/18/2017, 3:32 PM

## 2017-12-18 NOTE — Care Management Note (Addendum)
Case Management Note  Patient Details  Name: Neil [name] MRN: [id number] Date of Birth: 1929-05-30  Subjective/Objective:  Dementia.  From home with wife. Family has discussed options with CSW.  They elect to take patient home with home health. They will need a hospital bed, WC, and shower chair. Would like DME delivered to house by [facility].                Action/Plan:  Discussed with grandson Edison Nasuti via phone. Equipment will be delivered tomorrow by [facility]. Will order RN, PT, and aide services with [facility]. Juliann Pulse of [facility] notified and will obtain orders via Epic.  Services to start tomorrow if possible.  Patient has a Long term care policy that family thinks will pay for a sitter/care giver. Edison Nasuti plans to call [facility] to have them evaluate policy.  Patient is [facility] eligible. Discussed with Edison Nasuti, who is agreeable.   Janci of [facility] is in the hospital and will discuss with patient's wife also.  Patient will need EMS transport home today.   Discussed all to this with patient's wife at the bedside also.    Expected Discharge Date:  12/20/17               Expected Discharge Plan:  [place]  In-House Referral:  Clinical Social Work  Discharge planning Services  CM Consult  Post Acute Care Choice:    Choice offered to:  (grandson)  DME Arranged:  Shower stool, Wheelchair manual, Hospital bed DME Agency:  [place]:  RN, PT, Nurse's Aide [facility] Agency:  [place]  Status of Service:  Completed, signed off  If discussed at [place] of Stay Meetings, dates discussed:    Additional Comments:  Jahree Dermody, Chauncey [place], RN 12/18/2017, 2:52 PM

## 2017-12-18 NOTE — Consult Note (Signed)
[facility] CM Inpatient Consult   12/18/2017  ALEXES LAMARQUE 1929/01/03 [id number]   Chart review revealed patient eligible for [place] Management services and post hospital discharge follow up related to a diagnosis of Dementia, DM falls and 2 admits in the last 6 months. Patient was evaluated for community based chronic disease management services with [facility] care Management Program as a benefit of patient's Next Gen Medicare. Collaborated with inpatient case manager about patient's needs. Inpatient CM stated that patient's grandson Rosalene Billings was a main contact for patient. Inpatient CM setting up for patient to have hospital bed and wheelchair for home. Met with the patient and his spouse [name] at the bedside to explain [place] Management services. Patient noted to have severe dementia and was not involved with the conversation. Spouse stating she is unable to continue to care for the patient at home alone. They have a long term care policy which will provide them with in home care but it needs to be set up. Spouse stating her husband will need some more in home equipment i.e. Shower chair, ramp and she is unsure of what else. She tells me she and her husband have been married for over 109 years and took in their grandson when he was about 9 and raised him. Patient may need assistance with transportation he is being transported by EMS to home this afternoon. Verbal consent obtained by spouse for [facility] Services. Spouse [name] gave [phone] as the best number to reach her. She also gave verbal permission to call her grandson Rometta Emery at [phone]. Patient will receive post hospital discharge engagement with a [place] and [facility] Social Worker. [facility] Care Management services do not interfere with or replace any services arranged by the inpatient care management team. RNCM left contact information and [facility] literature at the bedside. Made inpatient RNCM aware  [facility]  will be following for care management. For additional questions please contact:   Jacqulene Huntley RN, [facility] Liaison  [phone]) Business Mobile [phone]) Toll free office

## 2017-12-18 NOTE — NC FL2 (Signed)
[place] LEVEL OF CARE SCREENING TOOL     IDENTIFICATION  Patient Name: Neil [name] Birthdate: 1928-07-26 Sex: male Admission Date (Current Location): 12/17/2017  [facility] and [place] Number:  [employer] and Address:  [place] [place]      Provider Number: [id number]  Attending Physician Name and Address:  Rodena Goldmann, DO  Relative Name and Phone Number:       Current Level of Care: Other (Comment)(observation) Recommended Level of Care: [place] Prior Approval Number:    Date Approved/Denied:   PASRR Number: [id number])  Discharge Plan: SNF    Current Diagnoses: Patient Active Problem List   Diagnosis Date Noted  . Hypoglycemia due to type 2 diabetes mellitus ([place]) 12/17/2017  . Confusion   . General weakness   . Dehydration 11/09/2017  . FTT (failure to thrive) in adult 11/09/2017  . AKI (acute kidney injury) ([place]) 11/09/2017  . Dementia 11/09/2017  . Renal artery stenosis ([place])   . CKD (chronic kidney disease) stage 3, GFR 30-59 ml/min (HCC)   . Pacemaker-St. Jude 04/20/2011  . Permanent atrial fibrillation ([place]) 05/03/2010  . AV BLOCK, COMPLETE 11/02/2009  . ABDOMINAL PAIN-EPIGASTRIC 10/27/2008  . AODM 07/25/2008  . DYSLIPIDEMIA 07/25/2008  . Cardiomyopathy, ischemic 07/25/2008  . ATHEROSCLEROSIS OF RENAL ARTERY 07/25/2008  . HYPERTENSION, BENIGN 07/25/2008    Orientation RESPIRATION BLADDER Height & Weight     Self  Normal Incontinent Weight: 150 lb (68 kg) Height:  5\' 11"  (180.3 cm)  BEHAVIORAL SYMPTOMS/MOOD NEUROLOGICAL BOWEL NUTRITION STATUS      Incontinent (Heart Healthy)  AMBULATORY STATUS COMMUNICATION OF NEEDS Skin   Limited Assist Verbally Normal                       Personal Care Assistance Level of Assistance  Bathing, Feeding, Dressing Bathing Assistance: Limited assistance Feeding assistance: Independent Dressing  Assistance: Limited assistance     Functional Limitations Info  Sight, Hearing, Speech Sight Info: Adequate Hearing Info: Adequate Speech Info: Adequate    SPECIAL CARE FACTORS FREQUENCY  PT (By licensed PT)     PT Frequency: 5x/week              Contractures Contractures Info: Not present    Additional Factors Info  Code Status, Allergies Code Status Info: Full Code Allergies Info: Penicillins           Current Medications (12/18/2017):  This is the current hospital active medication list Current Facility-Administered Medications  Medication Dose Route Frequency Provider Last Rate Last Dose  . 0.9 % NaCl with KCl 40 mEq / L  infusion   Intravenous Continuous Phillips Grout, MD 75 mL/hr at 12/18/17 0641 75 mL/hr at 12/18/17 0641  . atorvastatin (LIPITOR) tablet 40 mg  40 mg Oral Daily Derrill Kay A, MD   40 mg at 12/18/17 0858  . calcitRIOL (ROCALTROL) capsule 0.25 mcg  0.25 mcg Oral Q M,W,F Derrill Kay A, MD   0.25 mcg at 12/17/17 1833  . carvedilol (COREG) tablet 6.25 mg  6.25 mg Oral BID WC Derrill Kay A, MD   6.25 mg at 12/18/17 0858  . dabigatran (PRADAXA) capsule 75 mg  75 mg Oral Q12H Derrill Kay A, MD   75 mg at 12/18/17 0858  . feeding supplement (ENSURE ENLIVE) (ENSURE ENLIVE) liquid 237 mL  237 mL Oral BID BM Phillips Grout, MD      .  losartan (COZAAR) tablet 50 mg  50 mg Oral Daily Derrill Kay A, MD   50 mg at 12/18/17 0858  . multivitamin with minerals tablet 1 tablet  1 tablet Oral Daily Manuella Ghazi, Pratik D, DO         Discharge Medications: Please see discharge summary for a list of discharge medications.  Relevant Imaging Results:  Relevant Lab Results:   Additional Information SSN [id number]. Patient will be private pay. He has a long term care policy through [employer] #[id number].   Vivia [place], Clydene Pugh, LCSW

## 2017-12-18 NOTE — Evaluation (Signed)
Physical Therapy Evaluation Patient Details Name: Neil [name] MRN: [id number] DOB: 1928/07/14 Today's Date: 12/18/2017   History of Present Illness  Neil [name] is a 84 y.o. male with medical history significant of dementia, afib, ckd, FTT, dm brought in by wife due to not eating or drinking much for about a week.  Pt at baseline sometimes can walk with a walker.  He has deteriorated in the last several months to the point she can no longer care for him alone at home and is wanted SNF placement.  No fevers.  No n/v/d.  More confused than normal.  On arrival to ED his glucose was 60, on glipizide at home.  All history obtained from wife as pt unable to provide due to dementia.    Clinical Impression  Patient requires assistance for sitting up at bedside and limited to a few steps due to poor standing balance, BLE weakness and fatigue.  Patient's family request to take him home, patient to be discharged home today with HHPT follow up.  Plan: Patient discharged from physical therapy to care of nursing.  Patient suffers from coronary artery disease resulting in chronic weakness which impairs his ability to perform daily activities like prolonged standing, walking and completing routine ADLs in the home.  A walker alone will not resolve the issues with performing activities of daily living. A wheelchair will allow patient to safely perform daily activities.  The patient can self propel in the home or has a caregiver who can provide assistance.     Patient will also benefit from use of hospital bed for above mentioned reasons.  Patient has difficulty sitting up from and getting into/out of and sleeping in a regular bed due to weakness and fall risk.  Patient will benefit from a bed that can have height adjusted from floor to allow patient to complete sit to stands safely and has side rails to increase safety when rolling, and adjustable head and foot positions in order for patient to sleep in most  comfortable position possible.    Follow Up Recommendations SNF;Supervision/Assistance - 24 hour;Supervision for mobility/OOB    Equipment Recommendations  Wheelchair (measurements PT);Wheelchair cushion (measurements PT);Hospital bed    Recommendations for Other Services       Precautions / Restrictions Precautions Precautions: Fall Restrictions Weight Bearing Restrictions: No      Mobility  Bed Mobility Overal bed mobility: Needs Assistance Bed Mobility: Supine to Sit;Sit to Supine     Supine to sit: Min assist;Mod assist Sit to supine: Min assist;Mod assist   General bed mobility comments: slow labored movement  Transfers Overall transfer level: Needs assistance Equipment used: Rolling walker (2 wheeled) Transfers: Sit to/from Stand Sit to Stand: Mod assist;Min assist         General transfer comment: unsteady on feet with VC's to lean forward during sit to stands  Ambulation/Gait Ambulation/Gait assistance: Mod assist Ambulation Distance (Feet): 5 Feet Assistive device: Rolling walker (2 wheeled) Gait Pattern/deviations: Decreased step length - right;Decreased step length - left;Decreased stride length Gait velocity: slow   General Gait Details: limited to 6-7 unsteady labored steps at bedside due to BLE weakness and poor standing balance  Stairs            Wheelchair Mobility    Modified Rankin (Stroke Patients Only)       Balance Overall balance assessment: Needs assistance Sitting-balance support: Feet supported;No upper extremity supported Sitting balance-Leahy Scale: Fair     Standing balance support: Bilateral  upper extremity supported;During functional activity Standing balance-Leahy Scale: Poor Standing balance comment: fair/poor using RW                             Pertinent Vitals/Pain Pain Assessment: No/denies pain    Home Living Family/patient expects to be discharged to:: Private residence Living  Arrangements: Spouse/significant other Available Help at Discharge: Family Type of Home: House Home Access: Stairs to enter Entrance Stairs-Rails: Right Entrance Stairs-Number of Steps: 3 Home Layout: One level Home Equipment: Cane - single point;Walker - 2 wheels      Prior Function Level of Independence: Needs assistance   Gait / Transfers Assistance Needed: household gait with bilateral canes  ADL's / Homemaking Assistance Needed: assisted by spouse        Hand Dominance        Extremity/Trunk Assessment   Upper Extremity Assessment Upper Extremity Assessment: Generalized weakness    Lower Extremity Assessment Lower Extremity Assessment: Generalized weakness    Cervical / Trunk Assessment Cervical / Trunk Assessment: Normal  Communication   Communication: No difficulties  Cognition Arousal/Alertness: Awake/alert Behavior During Therapy: WFL for tasks assessed/performed Overall Cognitive Status: History of cognitive impairments - at baseline                                        General Comments      Exercises     Assessment/Plan    PT Assessment Patient needs continued PT services;All further PT needs can be met in the next venue of care  PT Problem List Decreased strength;Decreased activity tolerance;Decreased balance;Decreased mobility       PT Treatment Interventions      PT Goals (Current goals can be found in the Care Plan section)  Acute Rehab PT Goals Patient Stated Goal: return home with family to assist PT Goal Formulation: With patient/family Time For Goal Achievement: Dec 29, 2017 Potential to Achieve Goals: Good    Frequency     Barriers to discharge        Co-evaluation               AM-PAC PT "6 Clicks" Daily Activity  Outcome Measure Difficulty turning over in bed (including adjusting bedclothes, sheets and blankets)?: A Little Difficulty moving from lying on back to sitting on the side of the bed? : A  Lot Difficulty sitting down on and standing up from a chair with arms (e.g., wheelchair, bedside commode, etc,.)?: A Lot Help needed moving to and from a bed to chair (including a wheelchair)?: A Lot Help needed walking in hospital room?: A Lot Help needed climbing 3-5 steps with a railing? : A Lot 6 Click Score: 13    End of Session   Activity Tolerance: Patient tolerated treatment well Patient left: in bed;with call bell/phone within reach;with family/visitor present Nurse Communication: Mobility status PT Visit Diagnosis: Unsteadiness on feet (R26.81);Other abnormalities of gait and mobility (R26.89);Muscle weakness (generalized) (M62.81)    Time: 5379-4327 PT Time Calculation (min) (ACUTE ONLY): 30 min   Charges:   PT Evaluation $PT Eval Moderate Complexity: 1 Mod PT Treatments $Therapeutic Activity: 23-37 mins   PT G Codes:        3:22 PM, 2017/12/29 Lonell Grandchild, MPT Physical Therapist with [facility] [phone] office [phone] mobile phone

## 2017-12-18 NOTE — Care Management (Signed)
EMS transport arranged for 4pm.

## 2017-12-18 NOTE — Progress Notes (Signed)
Initial Nutrition Assessment  DOCUMENTATION CODES:   Severe malnutrition in context of chronic illness  INTERVENTION:   Monitor PO intake to meet needs.  Continue Ensure Enlive po BID, each supplement provides 350 kcal and 20 grams of protein  Will order Multivitamin daily.  NUTRITION DIAGNOSIS:   Severe Malnutrition related to chronic illness(dementia) as evidenced by >19% percent weight loss over >10 months, moderate to severe fat depletion, and moderate to severe muscle depletion.  GOAL:   Patient will meet greater than or equal to 90% of their needs  MONITOR:   PO intake, Supplement acceptance, Weight trends, Skin  REASON FOR ASSESSMENT:   Consult, Malnutrition Screening Tool, Low Braden Assessment of nutrition requirement/status, Poor PO  ASSESSMENT:  84 y/o male with Hx of CKD3, CAD, dementia, DM, dyslipidemia, HTN, pacemaker, and renal artery stenosis.  Admitted by wife for sliding from his recliner to the floor; increased confusion.  MST score of 4, Low Braden 9, and consult for nutrition needs and poor PO.   Pt with dementia so most hx obtained from family at bedside.  Pt's wife was main source of hx, but still a poor historian when getting concrete answers regarding wt loss, diet hx, and confirming information mentioned in previous notes.    According to wife, pt's appetite has been good and pt has been eating at home.  This is contradictory to previous notes mentioning pt has not been eating/drinking much in the past week.  Pt's diet at home includes bacon, eggs, and orange juice for breakfast, and mac n' cheese or chicken with "LOTS of vegetables" with regular coca-cola for other meals.  Wife was very adamant about emphasizing they eat a lot of vegetables.  Wife also mentioned pt loves fruit: bananas, apples, grapes.  Coke consumption was said to be less than three 6 oz cans/day.  Pt's wife is aware that soda is not good for pt's DM.  She said they check pt's blood  sugar regularly and a normal morning BS is 129.  Pt takes a pill everyday to control BS, not insulin.  Pt does not take any supplements or vitamins at home.    Pt has had significant wt loss of >19% BW over the past 10-11 months, which is significant for severe malnutrition in context of a chronic illness.  Pt's wife said that pt's UBW is around 180 lbs and that he last weighed this around the start of 2019, which she says is also when his weight loss started.  The NFPE showed moderate to severe signs of muscle and fat loss, confirming pt's condition of severe malnutrition.    Currently pt is on a heart healthy diet.  Glucose levels seem to be steady WNL.  Pt's family showed RD intern pt's breakfast tray upon visit, and 85-90% of tray was completed, only some toast and egg remnants remained, confirming wife's and pt's report of a good appetite.  Intervention will be to continue to monitor PO intake to make sure current appetite continues, monitor wt trends to avoid anymore wt loss, continue Ensure BID, and order MVI.    Labs reviewed: WNL Recent Labs  Lab 12/17/17 1435 12/17/17 1552 12/18/17 0450  NA 144  --  143  K 3.1*  --  3.5  CL 108  --  110  CO2 26  --  28  BUN 20  --  20  CREATININE 1.06  --  0.91  CALCIUM 9.1  --  8.3*  MG  --  1.8  --   GLUCOSE 79  --  83   Medications: Lipitor, calcitriol, coreg, enlive BID, cozaar, saline  Past Medical History:  Diagnosis Date  . Atrial fibrillation ([place])   . Chronic kidney disease (CKD), stage III (moderate) (HCC)   . Complete heart block ([place])   . Coronary artery disease    status post CABG1999  . Dementia   . Diabetes mellitus   . Dyslipidemia   . Hypertension   . Osteoarthritis   . Pacemaker    St Jude status post generator replacement 2007  . Renal artery stenosis (HCC)    95%  left, 50% right   NUTRITION - FOCUSED PHYSICAL EXAM:    Most Recent Value  Orbital Region  Mild depletion  Upper Arm Region  Mild depletion   Thoracic and Lumbar Region  Moderate depletion  Temple Region  Severe depletion  Clavicle Bone Region  Severe depletion  Clavicle and Acromion Bone Region  Severe depletion  Dorsal Hand  Unable to assess [mitts]  Anterior Thigh Region  Severe depletion  Posterior Calf Region  Mild depletion [mild to moderate]  Edema (RD Assessment)  None  Skin  Other (Comment) [ecchymosis]  Nails  Unable to assess     Diet Order:   Diet Order           Diet Heart Room service appropriate? Yes; Fluid consistency: Thin  Diet effective now         EDUCATION NEEDS:   No education needs have been identified at this time  Skin:  Skin Assessment: Skin Integrity Issues: Skin Integrity Issues:: Other (Comment) Other: Abrasion: bilateral arm/back/knee.  Ecchymosis: circumferential arm/back/leg.  MASD: buttocks/groin/scrotum, right/left/circumferential.  Tear: medial/lateral/bilateral arm  Last BM:  6/10  Height:   Ht Readings from Last 1 Encounters:  12/17/17 5' 11"  (1.803 m)   Weight:   Wt Readings from Last 1 Encounters:  12/17/17 150 lb (68 kg)   Ideal Body Weight:  78 kg  BMI:  Body mass index is 20.92 kg/m.  Estimated Nutritional Needs:   Kcal:  1770-1970 kcal (26-29 kcal/kg BW)  Protein:  55-61 g (0.8-0.9 g/kg BW)  Fluid:  1770-1970 mL (29m/kcal)

## 2017-12-18 NOTE — Plan of Care (Signed)
Adequate for discharge.

## 2017-12-19 ENCOUNTER — Other Ambulatory Visit: Payer: Self-pay

## 2017-12-19 DIAGNOSIS — I482 Chronic atrial fibrillation: Secondary | ICD-10-CM | POA: Diagnosis not present

## 2017-12-19 DIAGNOSIS — Z950 Presence of cardiac pacemaker: Secondary | ICD-10-CM | POA: Diagnosis not present

## 2017-12-19 DIAGNOSIS — E11649 Type 2 diabetes mellitus with hypoglycemia without coma: Secondary | ICD-10-CM | POA: Diagnosis not present

## 2017-12-19 DIAGNOSIS — M1991 Primary osteoarthritis, unspecified site: Secondary | ICD-10-CM | POA: Diagnosis not present

## 2017-12-19 DIAGNOSIS — I255 Ischemic cardiomyopathy: Secondary | ICD-10-CM | POA: Diagnosis not present

## 2017-12-19 DIAGNOSIS — I129 Hypertensive chronic kidney disease with stage 1 through stage 4 chronic kidney disease, or unspecified chronic kidney disease: Secondary | ICD-10-CM | POA: Diagnosis not present

## 2017-12-19 DIAGNOSIS — E785 Hyperlipidemia, unspecified: Secondary | ICD-10-CM | POA: Diagnosis not present

## 2017-12-19 DIAGNOSIS — F0390 Unspecified dementia without behavioral disturbance: Secondary | ICD-10-CM | POA: Diagnosis not present

## 2017-12-19 DIAGNOSIS — I442 Atrioventricular block, complete: Secondary | ICD-10-CM | POA: Diagnosis not present

## 2017-12-19 DIAGNOSIS — E43 Unspecified severe protein-calorie malnutrition: Secondary | ICD-10-CM | POA: Diagnosis not present

## 2017-12-19 DIAGNOSIS — I701 Atherosclerosis of renal artery: Secondary | ICD-10-CM | POA: Diagnosis not present

## 2017-12-19 DIAGNOSIS — E1122 Type 2 diabetes mellitus with diabetic chronic kidney disease: Secondary | ICD-10-CM | POA: Diagnosis not present

## 2017-12-19 DIAGNOSIS — Z951 Presence of aortocoronary bypass graft: Secondary | ICD-10-CM | POA: Diagnosis not present

## 2017-12-19 DIAGNOSIS — Z7901 Long term (current) use of anticoagulants: Secondary | ICD-10-CM | POA: Diagnosis not present

## 2017-12-19 DIAGNOSIS — N183 Chronic kidney disease, stage 3 (moderate): Secondary | ICD-10-CM | POA: Diagnosis not present

## 2017-12-19 NOTE — Patient Outreach (Signed)
[place] [facility]) Care Management  12/19/2017  Neil [name] 02-28-1929 [id number]    Neil [name] is a 84 year old male.  Referral received from hospital liaison due to [name]'s recent hospital discharge. Member was admitted on 12/17/17 and discharged home on 12/18/17. Per medical record, his diagnosis includes Dementia, Ischemic Cardiomyopathy, Permanent A-fib, Failure to thrive, Diabetes, Chronic Kidney Disease, and Severe protein-calorie malnutrition.  Call placed to Neil [name], member's spouse. HIPPA verified. Neil [name] reported that [place] was visiting at the time of the call. She reported that they would not be available today or the remainder of the week due to previously scheduled appointments. Neil [name] requested a call back on Monday June 17th to discuss a possible date for the initial assessment.  PLAN RN CM will follow up on 12/24/17.   [place] Management [facility] Manager [phone]

## 2017-12-20 DIAGNOSIS — I129 Hypertensive chronic kidney disease with stage 1 through stage 4 chronic kidney disease, or unspecified chronic kidney disease: Secondary | ICD-10-CM | POA: Diagnosis not present

## 2017-12-20 DIAGNOSIS — E43 Unspecified severe protein-calorie malnutrition: Secondary | ICD-10-CM | POA: Diagnosis not present

## 2017-12-20 DIAGNOSIS — E11649 Type 2 diabetes mellitus with hypoglycemia without coma: Secondary | ICD-10-CM | POA: Diagnosis not present

## 2017-12-20 DIAGNOSIS — E1122 Type 2 diabetes mellitus with diabetic chronic kidney disease: Secondary | ICD-10-CM | POA: Diagnosis not present

## 2017-12-20 DIAGNOSIS — I255 Ischemic cardiomyopathy: Secondary | ICD-10-CM | POA: Diagnosis not present

## 2017-12-20 DIAGNOSIS — F0390 Unspecified dementia without behavioral disturbance: Secondary | ICD-10-CM | POA: Diagnosis not present

## 2017-12-21 DIAGNOSIS — E11649 Type 2 diabetes mellitus with hypoglycemia without coma: Secondary | ICD-10-CM | POA: Diagnosis not present

## 2017-12-21 DIAGNOSIS — E43 Unspecified severe protein-calorie malnutrition: Secondary | ICD-10-CM | POA: Diagnosis not present

## 2017-12-21 DIAGNOSIS — I255 Ischemic cardiomyopathy: Secondary | ICD-10-CM | POA: Diagnosis not present

## 2017-12-21 DIAGNOSIS — I129 Hypertensive chronic kidney disease with stage 1 through stage 4 chronic kidney disease, or unspecified chronic kidney disease: Secondary | ICD-10-CM | POA: Diagnosis not present

## 2017-12-21 DIAGNOSIS — F0390 Unspecified dementia without behavioral disturbance: Secondary | ICD-10-CM | POA: Diagnosis not present

## 2017-12-21 DIAGNOSIS — E1122 Type 2 diabetes mellitus with diabetic chronic kidney disease: Secondary | ICD-10-CM | POA: Diagnosis not present

## 2017-12-24 ENCOUNTER — Other Ambulatory Visit: Payer: Self-pay

## 2017-12-24 DIAGNOSIS — E43 Unspecified severe protein-calorie malnutrition: Secondary | ICD-10-CM | POA: Diagnosis not present

## 2017-12-24 DIAGNOSIS — I255 Ischemic cardiomyopathy: Secondary | ICD-10-CM | POA: Diagnosis not present

## 2017-12-24 DIAGNOSIS — F0390 Unspecified dementia without behavioral disturbance: Secondary | ICD-10-CM | POA: Diagnosis not present

## 2017-12-24 DIAGNOSIS — E1122 Type 2 diabetes mellitus with diabetic chronic kidney disease: Secondary | ICD-10-CM | POA: Diagnosis not present

## 2017-12-24 DIAGNOSIS — I129 Hypertensive chronic kidney disease with stage 1 through stage 4 chronic kidney disease, or unspecified chronic kidney disease: Secondary | ICD-10-CM | POA: Diagnosis not present

## 2017-12-24 DIAGNOSIS — E11649 Type 2 diabetes mellitus with hypoglycemia without coma: Secondary | ICD-10-CM | POA: Diagnosis not present

## 2017-12-24 NOTE — Patient Outreach (Signed)
[facility]) Care Management  12/24/2017  Neil [name] [birth year] [id number]   Call placed to member's spouse Neil [name] to schedule initial assessment visit. HIPAA identifiers obtained. Mrs. Neil [name] reported that Neil [name] was doing well today and receiving care assistance at the time of the call. She reported that he is scheduled for Home Health services later today. Mrs. Neil [name] reported that she needed to speak with her son before scheduling a time for the initial assessment. She requested a call back to confirm the time at noon on 12/25/17.  Mrs. Neil [name] denied urgent questions or concerns during the call. RN CM contact information provided. She was encouraged to notify RN CM with questions as needed.   PLAN RN CM will follow up on 12/25/17.     [place] Management [facility] Manager [phone]

## 2017-12-25 ENCOUNTER — Other Ambulatory Visit: Payer: Self-pay

## 2017-12-25 DIAGNOSIS — E43 Unspecified severe protein-calorie malnutrition: Secondary | ICD-10-CM | POA: Diagnosis not present

## 2017-12-25 DIAGNOSIS — F0390 Unspecified dementia without behavioral disturbance: Secondary | ICD-10-CM | POA: Diagnosis not present

## 2017-12-25 DIAGNOSIS — I129 Hypertensive chronic kidney disease with stage 1 through stage 4 chronic kidney disease, or unspecified chronic kidney disease: Secondary | ICD-10-CM | POA: Diagnosis not present

## 2017-12-25 DIAGNOSIS — I255 Ischemic cardiomyopathy: Secondary | ICD-10-CM | POA: Diagnosis not present

## 2017-12-25 DIAGNOSIS — E1122 Type 2 diabetes mellitus with diabetic chronic kidney disease: Secondary | ICD-10-CM | POA: Diagnosis not present

## 2017-12-25 DIAGNOSIS — E11649 Type 2 diabetes mellitus with hypoglycemia without coma: Secondary | ICD-10-CM | POA: Diagnosis not present

## 2017-12-25 NOTE — Patient Outreach (Signed)
[place] [facility]) Care Management  12/25/2017  Neil [name] 1928-12-13 [id number]   RN CM placed call to schedule initial visit. No answer. HIPAA compliant voice message left requesting a call back.  PLAN RN CM will follow up within 3 business days.   [place] Management [facility] Manager [phone]

## 2017-12-26 DIAGNOSIS — E11649 Type 2 diabetes mellitus with hypoglycemia without coma: Secondary | ICD-10-CM | POA: Diagnosis not present

## 2017-12-26 DIAGNOSIS — E43 Unspecified severe protein-calorie malnutrition: Secondary | ICD-10-CM | POA: Diagnosis not present

## 2017-12-26 DIAGNOSIS — I129 Hypertensive chronic kidney disease with stage 1 through stage 4 chronic kidney disease, or unspecified chronic kidney disease: Secondary | ICD-10-CM | POA: Diagnosis not present

## 2017-12-26 DIAGNOSIS — F0390 Unspecified dementia without behavioral disturbance: Secondary | ICD-10-CM | POA: Diagnosis not present

## 2017-12-26 DIAGNOSIS — I255 Ischemic cardiomyopathy: Secondary | ICD-10-CM | POA: Diagnosis not present

## 2017-12-26 DIAGNOSIS — E1122 Type 2 diabetes mellitus with diabetic chronic kidney disease: Secondary | ICD-10-CM | POA: Diagnosis not present

## 2017-12-27 DIAGNOSIS — F0390 Unspecified dementia without behavioral disturbance: Secondary | ICD-10-CM | POA: Diagnosis not present

## 2017-12-27 DIAGNOSIS — I255 Ischemic cardiomyopathy: Secondary | ICD-10-CM | POA: Diagnosis not present

## 2017-12-27 DIAGNOSIS — E43 Unspecified severe protein-calorie malnutrition: Secondary | ICD-10-CM | POA: Diagnosis not present

## 2017-12-27 DIAGNOSIS — E11649 Type 2 diabetes mellitus with hypoglycemia without coma: Secondary | ICD-10-CM | POA: Diagnosis not present

## 2017-12-27 DIAGNOSIS — E1122 Type 2 diabetes mellitus with diabetic chronic kidney disease: Secondary | ICD-10-CM | POA: Diagnosis not present

## 2017-12-27 DIAGNOSIS — I129 Hypertensive chronic kidney disease with stage 1 through stage 4 chronic kidney disease, or unspecified chronic kidney disease: Secondary | ICD-10-CM | POA: Diagnosis not present

## 2017-12-28 ENCOUNTER — Other Ambulatory Visit: Payer: Self-pay

## 2017-12-28 ENCOUNTER — Ambulatory Visit: Payer: Medicare Other

## 2017-12-28 DIAGNOSIS — E43 Unspecified severe protein-calorie malnutrition: Secondary | ICD-10-CM | POA: Diagnosis not present

## 2017-12-28 DIAGNOSIS — R17 Unspecified jaundice: Secondary | ICD-10-CM | POA: Diagnosis not present

## 2017-12-28 DIAGNOSIS — Z681 Body mass index (BMI) 19 or less, adult: Secondary | ICD-10-CM | POA: Diagnosis not present

## 2017-12-28 DIAGNOSIS — E11649 Type 2 diabetes mellitus with hypoglycemia without coma: Secondary | ICD-10-CM | POA: Diagnosis not present

## 2017-12-28 DIAGNOSIS — I4891 Unspecified atrial fibrillation: Secondary | ICD-10-CM | POA: Diagnosis not present

## 2017-12-28 DIAGNOSIS — E1122 Type 2 diabetes mellitus with diabetic chronic kidney disease: Secondary | ICD-10-CM | POA: Diagnosis not present

## 2017-12-28 DIAGNOSIS — R4182 Altered mental status, unspecified: Secondary | ICD-10-CM | POA: Diagnosis not present

## 2017-12-28 DIAGNOSIS — I129 Hypertensive chronic kidney disease with stage 1 through stage 4 chronic kidney disease, or unspecified chronic kidney disease: Secondary | ICD-10-CM | POA: Diagnosis not present

## 2017-12-28 DIAGNOSIS — I255 Ischemic cardiomyopathy: Secondary | ICD-10-CM | POA: Diagnosis not present

## 2017-12-28 DIAGNOSIS — F0390 Unspecified dementia without behavioral disturbance: Secondary | ICD-10-CM | POA: Diagnosis not present

## 2017-12-28 DIAGNOSIS — E860 Dehydration: Secondary | ICD-10-CM | POA: Diagnosis not present

## 2017-12-28 NOTE — Patient Outreach (Signed)
[place] [facility]) Care Management  12/28/2017  Neil [name] 1929/04/13 [id number]    RN CM placed follow up call. Spoke with spouse [name] and caretaker [name]. HIPAA verifiers obtained. Caretaker [name] reported that [name] was doing well, but preparing for an appointment with Dr. Gerarda Fraction. She reported a decrease in [name]'s fluid intake over the past few days and requested that RN CM speak with MD to determine if he needed to be evaluated for dehydration prior to his appointment. Per Modena Nunnery, [name] was alert and active. She reported that he was eating well, but required constant prompting to drink fluids. She reported no changes in urine color or odor. RN CM confirmed that [name] should report to the appointment as scheduled and concerns regarding his fluid intake would be addressed by the MD at that time.   PLAN RN CM will follow up for weekly outreach and confirm availability for home visit.   [place] [phone]

## 2017-12-31 DIAGNOSIS — F0390 Unspecified dementia without behavioral disturbance: Secondary | ICD-10-CM | POA: Diagnosis not present

## 2017-12-31 DIAGNOSIS — E11649 Type 2 diabetes mellitus with hypoglycemia without coma: Secondary | ICD-10-CM | POA: Diagnosis not present

## 2017-12-31 DIAGNOSIS — I255 Ischemic cardiomyopathy: Secondary | ICD-10-CM | POA: Diagnosis not present

## 2017-12-31 DIAGNOSIS — I129 Hypertensive chronic kidney disease with stage 1 through stage 4 chronic kidney disease, or unspecified chronic kidney disease: Secondary | ICD-10-CM | POA: Diagnosis not present

## 2017-12-31 DIAGNOSIS — E1122 Type 2 diabetes mellitus with diabetic chronic kidney disease: Secondary | ICD-10-CM | POA: Diagnosis not present

## 2017-12-31 DIAGNOSIS — E43 Unspecified severe protein-calorie malnutrition: Secondary | ICD-10-CM | POA: Diagnosis not present

## 2018-01-01 DIAGNOSIS — I129 Hypertensive chronic kidney disease with stage 1 through stage 4 chronic kidney disease, or unspecified chronic kidney disease: Secondary | ICD-10-CM | POA: Diagnosis not present

## 2018-01-01 DIAGNOSIS — F0390 Unspecified dementia without behavioral disturbance: Secondary | ICD-10-CM | POA: Diagnosis not present

## 2018-01-01 DIAGNOSIS — E1122 Type 2 diabetes mellitus with diabetic chronic kidney disease: Secondary | ICD-10-CM | POA: Diagnosis not present

## 2018-01-01 DIAGNOSIS — I255 Ischemic cardiomyopathy: Secondary | ICD-10-CM | POA: Diagnosis not present

## 2018-01-01 DIAGNOSIS — E11649 Type 2 diabetes mellitus with hypoglycemia without coma: Secondary | ICD-10-CM | POA: Diagnosis not present

## 2018-01-01 DIAGNOSIS — E43 Unspecified severe protein-calorie malnutrition: Secondary | ICD-10-CM | POA: Diagnosis not present

## 2018-01-02 DIAGNOSIS — E43 Unspecified severe protein-calorie malnutrition: Secondary | ICD-10-CM | POA: Diagnosis not present

## 2018-01-02 DIAGNOSIS — F0390 Unspecified dementia without behavioral disturbance: Secondary | ICD-10-CM | POA: Diagnosis not present

## 2018-01-02 DIAGNOSIS — E1122 Type 2 diabetes mellitus with diabetic chronic kidney disease: Secondary | ICD-10-CM | POA: Diagnosis not present

## 2018-01-02 DIAGNOSIS — I129 Hypertensive chronic kidney disease with stage 1 through stage 4 chronic kidney disease, or unspecified chronic kidney disease: Secondary | ICD-10-CM | POA: Diagnosis not present

## 2018-01-02 DIAGNOSIS — I255 Ischemic cardiomyopathy: Secondary | ICD-10-CM | POA: Diagnosis not present

## 2018-01-02 DIAGNOSIS — E11649 Type 2 diabetes mellitus with hypoglycemia without coma: Secondary | ICD-10-CM | POA: Diagnosis not present

## 2018-01-03 ENCOUNTER — Ambulatory Visit: Payer: Self-pay

## 2018-01-03 ENCOUNTER — Other Ambulatory Visit: Payer: Self-pay

## 2018-01-03 DIAGNOSIS — I129 Hypertensive chronic kidney disease with stage 1 through stage 4 chronic kidney disease, or unspecified chronic kidney disease: Secondary | ICD-10-CM | POA: Diagnosis not present

## 2018-01-03 DIAGNOSIS — E11649 Type 2 diabetes mellitus with hypoglycemia without coma: Secondary | ICD-10-CM | POA: Diagnosis not present

## 2018-01-03 DIAGNOSIS — E43 Unspecified severe protein-calorie malnutrition: Secondary | ICD-10-CM | POA: Diagnosis not present

## 2018-01-03 DIAGNOSIS — I255 Ischemic cardiomyopathy: Secondary | ICD-10-CM | POA: Diagnosis not present

## 2018-01-03 DIAGNOSIS — F0390 Unspecified dementia without behavioral disturbance: Secondary | ICD-10-CM | POA: Diagnosis not present

## 2018-01-03 DIAGNOSIS — E1122 Type 2 diabetes mellitus with diabetic chronic kidney disease: Secondary | ICD-10-CM | POA: Diagnosis not present

## 2018-01-03 NOTE — Patient Outreach (Signed)
[place] [facility]) Care Management  01/03/2018  Neil [name] [birth year] [id number]   Call placed to [name] to confirm appointment. [name] reported that member was doing well today. Appointment for Initial home visit changed.  Requested appointment on Monday 01/07/2018.    PLAN RN CM will complete visit on 01/07/18 as requested.     [place] [phone]

## 2018-01-04 DIAGNOSIS — I255 Ischemic cardiomyopathy: Secondary | ICD-10-CM | POA: Diagnosis not present

## 2018-01-04 DIAGNOSIS — I129 Hypertensive chronic kidney disease with stage 1 through stage 4 chronic kidney disease, or unspecified chronic kidney disease: Secondary | ICD-10-CM | POA: Diagnosis not present

## 2018-01-04 DIAGNOSIS — E1122 Type 2 diabetes mellitus with diabetic chronic kidney disease: Secondary | ICD-10-CM | POA: Diagnosis not present

## 2018-01-04 DIAGNOSIS — E11649 Type 2 diabetes mellitus with hypoglycemia without coma: Secondary | ICD-10-CM | POA: Diagnosis not present

## 2018-01-04 DIAGNOSIS — E43 Unspecified severe protein-calorie malnutrition: Secondary | ICD-10-CM | POA: Diagnosis not present

## 2018-01-04 DIAGNOSIS — F0390 Unspecified dementia without behavioral disturbance: Secondary | ICD-10-CM | POA: Diagnosis not present

## 2018-01-07 ENCOUNTER — Other Ambulatory Visit: Payer: Self-pay

## 2018-01-07 DIAGNOSIS — E1122 Type 2 diabetes mellitus with diabetic chronic kidney disease: Secondary | ICD-10-CM | POA: Diagnosis not present

## 2018-01-07 DIAGNOSIS — E43 Unspecified severe protein-calorie malnutrition: Secondary | ICD-10-CM | POA: Diagnosis not present

## 2018-01-07 DIAGNOSIS — F0390 Unspecified dementia without behavioral disturbance: Secondary | ICD-10-CM | POA: Diagnosis not present

## 2018-01-07 DIAGNOSIS — I255 Ischemic cardiomyopathy: Secondary | ICD-10-CM | POA: Diagnosis not present

## 2018-01-07 DIAGNOSIS — I129 Hypertensive chronic kidney disease with stage 1 through stage 4 chronic kidney disease, or unspecified chronic kidney disease: Secondary | ICD-10-CM | POA: Diagnosis not present

## 2018-01-07 DIAGNOSIS — E11649 Type 2 diabetes mellitus with hypoglycemia without coma: Secondary | ICD-10-CM | POA: Diagnosis not present

## 2018-01-07 NOTE — Patient Outreach (Signed)
[place] [facility]) Care Management   01/07/2018  Neil [name] [birth year] [id number]  Neil [name] is an 84 y.o. male  Subjective:  RN CM met with member for initial home visit. Neil [name] was pleasant throughout visit but unable to answer assessment questions due to cognitive impairment. Consent and assistance provided by Neil [name](spouse) and Neil [name] (family friend/caretaker).   Objective:   Review of Systems  Constitutional: Negative.   Gastrointestinal: Negative.   Genitourinary: Negative.   Skin: Negative.   Neurological: Positive for headaches.  Psychiatric/Behavioral: Positive for memory loss.    Encounter Medications:   Outpatient Encounter Medications as of 01/07/2018  Medication Sig  . atorvastatin (LIPITOR) 40 MG tablet Take 40 mg by mouth daily.    . calcitRIOL (ROCALTROL) 0.25 MCG capsule Take 0.25 mcg by mouth every Monday, Wednesday, and Friday.  . carvedilol (COREG) 6.25 MG tablet Take 6.25 mg by mouth 2 (two) times daily with a meal.  . dabigatran (PRADAXA) 75 MG CAPS capsule Take 1 capsule (75 mg total) by mouth every 12 (twelve) hours. (Patient taking differently: Take 75 mg by mouth daily. )  . feeding supplement, ENSURE ENLIVE, (ENSURE ENLIVE) LIQD Take 237 mLs by mouth 2 (two) times daily between meals.  Marland Kitchen losartan (COZAAR) 50 MG tablet Take 50 mg by mouth daily.  . megestrol (MEGACE ES) 625 MG/5ML suspension Take 5 mLs (625 mg total) by mouth daily.   No facility-administered encounter medications on file as of 01/07/2018.     Functional Status:   In your present state of health, do you have any difficulty performing the following activities: 12/17/2017 11/09/2017  Hearing? N N  Vision? N N  Difficulty concentrating or making decisions? Neil [name]  Walking or climbing stairs? Y Y  Dressing or bathing? Y Y  Doing errands, shopping? Y Y  Some recent data might be hidden     Assessment:   Spouse and caretaker reported that Neil [name] was  "doing pretty good" and taking medications as currently prescribed. Neil [name] was knowledgeable of member's chronic illnesses and reported recent blood glucose ranges of 93-123. They reported that Neil [name] was having difficulty sleeping and a trial dose of medication was provided following his last MD appointment. Spouse reported changes in member's temperament a few hours after taking the medication and stated, "he became very agitated." She reported that she has not requested additional doses and will follow up with MD to discuss other options.   RN CM discussed focus of care to include chronic disease management, medication management, safety and fall prevention, pressure wound prevention, and caregiver support services. Assessed transportation needs.  Neil [name] and Neil [name] reported that they are looking to acquire a wheelchair accessible Neil [name] and will notify RN CM if transportation assistance is needed.   PLAN RN CM will continue weekly outreach.   [place] [phone]

## 2018-01-08 DIAGNOSIS — I129 Hypertensive chronic kidney disease with stage 1 through stage 4 chronic kidney disease, or unspecified chronic kidney disease: Secondary | ICD-10-CM | POA: Diagnosis not present

## 2018-01-08 DIAGNOSIS — E1122 Type 2 diabetes mellitus with diabetic chronic kidney disease: Secondary | ICD-10-CM | POA: Diagnosis not present

## 2018-01-08 DIAGNOSIS — I255 Ischemic cardiomyopathy: Secondary | ICD-10-CM | POA: Diagnosis not present

## 2018-01-08 DIAGNOSIS — E43 Unspecified severe protein-calorie malnutrition: Secondary | ICD-10-CM | POA: Diagnosis not present

## 2018-01-08 DIAGNOSIS — F0390 Unspecified dementia without behavioral disturbance: Secondary | ICD-10-CM | POA: Diagnosis not present

## 2018-01-08 DIAGNOSIS — E11649 Type 2 diabetes mellitus with hypoglycemia without coma: Secondary | ICD-10-CM | POA: Diagnosis not present

## 2018-01-09 DIAGNOSIS — I129 Hypertensive chronic kidney disease with stage 1 through stage 4 chronic kidney disease, or unspecified chronic kidney disease: Secondary | ICD-10-CM | POA: Diagnosis not present

## 2018-01-09 DIAGNOSIS — E11649 Type 2 diabetes mellitus with hypoglycemia without coma: Secondary | ICD-10-CM | POA: Diagnosis not present

## 2018-01-09 DIAGNOSIS — F0390 Unspecified dementia without behavioral disturbance: Secondary | ICD-10-CM | POA: Diagnosis not present

## 2018-01-09 DIAGNOSIS — E1122 Type 2 diabetes mellitus with diabetic chronic kidney disease: Secondary | ICD-10-CM | POA: Diagnosis not present

## 2018-01-09 DIAGNOSIS — E43 Unspecified severe protein-calorie malnutrition: Secondary | ICD-10-CM | POA: Diagnosis not present

## 2018-01-09 DIAGNOSIS — I255 Ischemic cardiomyopathy: Secondary | ICD-10-CM | POA: Diagnosis not present

## 2018-01-10 DIAGNOSIS — E1122 Type 2 diabetes mellitus with diabetic chronic kidney disease: Secondary | ICD-10-CM | POA: Diagnosis not present

## 2018-01-10 DIAGNOSIS — I129 Hypertensive chronic kidney disease with stage 1 through stage 4 chronic kidney disease, or unspecified chronic kidney disease: Secondary | ICD-10-CM | POA: Diagnosis not present

## 2018-01-10 DIAGNOSIS — F0390 Unspecified dementia without behavioral disturbance: Secondary | ICD-10-CM | POA: Diagnosis not present

## 2018-01-10 DIAGNOSIS — E11649 Type 2 diabetes mellitus with hypoglycemia without coma: Secondary | ICD-10-CM | POA: Diagnosis not present

## 2018-01-10 DIAGNOSIS — E43 Unspecified severe protein-calorie malnutrition: Secondary | ICD-10-CM | POA: Diagnosis not present

## 2018-01-10 DIAGNOSIS — I255 Ischemic cardiomyopathy: Secondary | ICD-10-CM | POA: Diagnosis not present

## 2018-01-11 DIAGNOSIS — E43 Unspecified severe protein-calorie malnutrition: Secondary | ICD-10-CM | POA: Diagnosis not present

## 2018-01-11 DIAGNOSIS — I129 Hypertensive chronic kidney disease with stage 1 through stage 4 chronic kidney disease, or unspecified chronic kidney disease: Secondary | ICD-10-CM | POA: Diagnosis not present

## 2018-01-11 DIAGNOSIS — I255 Ischemic cardiomyopathy: Secondary | ICD-10-CM | POA: Diagnosis not present

## 2018-01-11 DIAGNOSIS — E11649 Type 2 diabetes mellitus with hypoglycemia without coma: Secondary | ICD-10-CM | POA: Diagnosis not present

## 2018-01-11 DIAGNOSIS — E1122 Type 2 diabetes mellitus with diabetic chronic kidney disease: Secondary | ICD-10-CM | POA: Diagnosis not present

## 2018-01-11 DIAGNOSIS — F0390 Unspecified dementia without behavioral disturbance: Secondary | ICD-10-CM | POA: Diagnosis not present

## 2018-01-14 ENCOUNTER — Other Ambulatory Visit: Payer: Self-pay

## 2018-01-14 NOTE — Patient Outreach (Signed)
[facility]) Care Management  01/14/2018  Neil [name] 04-26-1929 [id number]   Outreach call complete. Neil [name] requested that RN CM speak with Neil [name] (family friend/caretaker).  Neil [name] reported that Neil [name]'s appetite has not declined and he is tolerating prescribed medications.  Member's blood glucose levels were not monitored today due to not having test strips. Neil [name] reported that strips have been ordered and pending delivery from [place]. RN CM discussed signs and symptoms of hypoglycemia and hyperglycemia. Neil [name] reported that Neil [name] has not displayed symptoms.  Neil [name] reported that Neil [name] "had two good nights." She reported that he is now able to "scoot" to the door and unlock it. Discussed progression with member's ability to stand but concerned about his constant requests to "get out and go to work. She reported no falls or injuries since discharge. Neil [name] reported that they were able to acquire a wheelchair accessible Lucianne Lei and do not have any transportation needs at this time.   PLAN RN CM will follow up on next week.   [place] [phone]

## 2018-01-15 DIAGNOSIS — Z681 Body mass index (BMI) 19 or less, adult: Secondary | ICD-10-CM | POA: Diagnosis not present

## 2018-01-15 DIAGNOSIS — I4891 Unspecified atrial fibrillation: Secondary | ICD-10-CM | POA: Diagnosis not present

## 2018-01-15 DIAGNOSIS — F0390 Unspecified dementia without behavioral disturbance: Secondary | ICD-10-CM | POA: Diagnosis not present

## 2018-01-15 DIAGNOSIS — I10 Essential (primary) hypertension: Secondary | ICD-10-CM | POA: Diagnosis not present

## 2018-01-16 DIAGNOSIS — I129 Hypertensive chronic kidney disease with stage 1 through stage 4 chronic kidney disease, or unspecified chronic kidney disease: Secondary | ICD-10-CM | POA: Diagnosis not present

## 2018-01-16 DIAGNOSIS — I255 Ischemic cardiomyopathy: Secondary | ICD-10-CM | POA: Diagnosis not present

## 2018-01-16 DIAGNOSIS — E11649 Type 2 diabetes mellitus with hypoglycemia without coma: Secondary | ICD-10-CM | POA: Diagnosis not present

## 2018-01-16 DIAGNOSIS — E1122 Type 2 diabetes mellitus with diabetic chronic kidney disease: Secondary | ICD-10-CM | POA: Diagnosis not present

## 2018-01-16 DIAGNOSIS — E43 Unspecified severe protein-calorie malnutrition: Secondary | ICD-10-CM | POA: Diagnosis not present

## 2018-01-16 DIAGNOSIS — F0390 Unspecified dementia without behavioral disturbance: Secondary | ICD-10-CM | POA: Diagnosis not present

## 2018-01-17 DIAGNOSIS — I129 Hypertensive chronic kidney disease with stage 1 through stage 4 chronic kidney disease, or unspecified chronic kidney disease: Secondary | ICD-10-CM | POA: Diagnosis not present

## 2018-01-17 DIAGNOSIS — F0390 Unspecified dementia without behavioral disturbance: Secondary | ICD-10-CM | POA: Diagnosis not present

## 2018-01-17 DIAGNOSIS — E1122 Type 2 diabetes mellitus with diabetic chronic kidney disease: Secondary | ICD-10-CM | POA: Diagnosis not present

## 2018-01-17 DIAGNOSIS — E43 Unspecified severe protein-calorie malnutrition: Secondary | ICD-10-CM | POA: Diagnosis not present

## 2018-01-17 DIAGNOSIS — E11649 Type 2 diabetes mellitus with hypoglycemia without coma: Secondary | ICD-10-CM | POA: Diagnosis not present

## 2018-01-17 DIAGNOSIS — I255 Ischemic cardiomyopathy: Secondary | ICD-10-CM | POA: Diagnosis not present

## 2018-01-18 ENCOUNTER — Encounter: Payer: Self-pay | Admitting: *Deleted

## 2018-01-18 ENCOUNTER — Other Ambulatory Visit: Payer: Self-pay | Admitting: *Deleted

## 2018-01-18 DIAGNOSIS — I129 Hypertensive chronic kidney disease with stage 1 through stage 4 chronic kidney disease, or unspecified chronic kidney disease: Secondary | ICD-10-CM | POA: Diagnosis not present

## 2018-01-18 DIAGNOSIS — F0390 Unspecified dementia without behavioral disturbance: Secondary | ICD-10-CM | POA: Diagnosis not present

## 2018-01-18 DIAGNOSIS — I255 Ischemic cardiomyopathy: Secondary | ICD-10-CM | POA: Diagnosis not present

## 2018-01-18 DIAGNOSIS — E11649 Type 2 diabetes mellitus with hypoglycemia without coma: Secondary | ICD-10-CM | POA: Diagnosis not present

## 2018-01-18 DIAGNOSIS — E1122 Type 2 diabetes mellitus with diabetic chronic kidney disease: Secondary | ICD-10-CM | POA: Diagnosis not present

## 2018-01-18 DIAGNOSIS — E43 Unspecified severe protein-calorie malnutrition: Secondary | ICD-10-CM | POA: Diagnosis not present

## 2018-01-18 NOTE — Patient Outreach (Addendum)
[place] [facility]) Care Management  [facility] Social Work  01/18/2018  Neil [name] 11/10/1928 [id number]   Encounter Medications:  Outpatient Encounter Medications as of 01/18/2018  Medication Sig Note  . atorvastatin (LIPITOR) 40 MG tablet Take 40 mg by mouth daily.     . calcitRIOL (ROCALTROL) 0.25 MCG capsule Take 0.25 mcg by mouth every Monday, Wednesday, and Friday.   . carvedilol (COREG) 6.25 MG tablet Take 6.25 mg by mouth 2 (two) times daily with a meal.   . dabigatran (PRADAXA) 75 MG CAPS capsule Take 1 capsule (75 mg total) by mouth every 12 (twelve) hours. (Patient taking differently: Take 75 mg by mouth daily. ) 01/07/2018: Different Dose.  . feeding supplement, ENSURE ENLIVE, (ENSURE ENLIVE) LIQD Take 237 mLs by mouth 2 (two) times daily between meals.   Marland Kitchen glipiZIDE (GLUCOTROL XL) 5 MG 24 hr tablet Take 5 mg by mouth 2 (two) times daily.   Marland Kitchen losartan (COZAAR) 50 MG tablet Take 50 mg by mouth daily.   . megestrol (MEGACE ES) 625 MG/5ML suspension Take 5 mLs (625 mg total) by mouth daily. (Patient not taking: Reported on 01/07/2018)    No facility-administered encounter medications on file as of 01/18/2018.     Functional Status:  In your present state of health, do you have any difficulty performing the following activities: 01/18/2018 01/07/2018  Hearing? N N  Vision? N N  Difficulty concentrating or making decisions? Tempie Donning  Walking or climbing stairs? Y Y  Dressing or bathing? Y -  Doing errands, shopping? Tempie Donning  Some recent data might be hidden    Fall/Depression Screening:  PHQ 2/9 Scores 01/18/2018  PHQ - 2 Score 0    Assessment:   CSW received referral from [facility], [place] for community resources and patient transportation. Patient has dementia, spouse/care giver [name] is the main contact and their grandson Rometta Emery is also a contact. Spouse needs support with caring for husband at home. CSW met with patient's wife, [name] and their  family-friend/caregiver, Modena Nunnery who has been staying with them and providing support. Patient's wife requested any and all information pertaining to dementia support and resources. CSW provided them with brochures for Aging, [place] [facility] [place] ([facility]) for wheelchair Lucianne Lei transportation to medical appointments and errands. Patient's wife states that he has been "cooped up" in the house for the past 4 weeks since returning home from the hospital and has been itching to get out. Patient's wife states that they are hoping to get a wheelchair Lucianne Lei next week but was very grateful for the information on [facility] incase that doesn't happen or if their wheelchair Lucianne Lei ever needs repair.   CSW informed patient's wife about Advertising copywriter ([place]) and their [employer] program ([place]) [place]. Patient's wife had heard of both but have not had a chance to visit it yet. Dori & [name] plan to go by there next week, especially with knowing that they have a bath/shower and a hairdresser and patient has been needing to get his haircut and is used to wearing a hairpiece but has not been wearing it. CSW also informed [name] that there is a Dementia caregiver support group monthly at the [facility] and the next one is scheduled for August 14th, per the [employer] website.   Patient's wife was also concerned about patient getting more ambulatory as he regains his strength with the  help of PT/OT through [place] and requested resources for keeping him secure in the house. CSW provided patient with the phone number for Harvel Quale with [employer] (ph#: [phone] / [phone]) to see what technology is available to alert them when he opens a door, etc.   Plan:   [facility] CM Care Plan Problem One     Most Recent Value  Care Plan Problem One  Community resources related to Dementia   Role Documenting  the Problem One  Clinical Social Worker  [facility] CM Short Term Goal #1   Patient's wife will review community resources provided to her by CSW and make contact with requested agencies.   [facility] CM Short Term Goal #1 Start Date  01/18/18  Interventions for Short Term Goal #1  CSW provided patient's wife with resources for [place], [employer] & Dementia caregiver support groups.        CSW will plan to check back in with patient's wife in 3 weeks to ensure that she has gotten in touch with Merry Proud at [employer] [facility] and had a chance to visit with [facility] and remind her about the Dementia caregiver support group.    Raynaldo Opitz, LCSW [facility]  Clinical Social Worker cell #: [phone]

## 2018-01-21 ENCOUNTER — Other Ambulatory Visit: Payer: Self-pay

## 2018-01-21 DIAGNOSIS — F0390 Unspecified dementia without behavioral disturbance: Secondary | ICD-10-CM | POA: Diagnosis not present

## 2018-01-21 DIAGNOSIS — E1122 Type 2 diabetes mellitus with diabetic chronic kidney disease: Secondary | ICD-10-CM | POA: Diagnosis not present

## 2018-01-21 DIAGNOSIS — E43 Unspecified severe protein-calorie malnutrition: Secondary | ICD-10-CM | POA: Diagnosis not present

## 2018-01-21 DIAGNOSIS — I255 Ischemic cardiomyopathy: Secondary | ICD-10-CM | POA: Diagnosis not present

## 2018-01-21 DIAGNOSIS — E11649 Type 2 diabetes mellitus with hypoglycemia without coma: Secondary | ICD-10-CM | POA: Diagnosis not present

## 2018-01-21 DIAGNOSIS — I129 Hypertensive chronic kidney disease with stage 1 through stage 4 chronic kidney disease, or unspecified chronic kidney disease: Secondary | ICD-10-CM | POA: Diagnosis not present

## 2018-01-21 NOTE — Patient Outreach (Signed)
[place] [facility]) Care Management  01/21/2018  Neil [name] [birth year] [id number]   Weekly outreach call complete. Neil [name] spouse Neil [name] requested that RN CM speak with caretaker Neil [name] to discuss Neil [name]'s care. Neil [name] reported that Neil [name] was doing well and tolerating medications as prescribed. She stated that he "scoots around" in his wheelchair and makes frequent attempts to stand up and get out of the door. Per Neil [name], caretakers are now available around the clock, and deadbolt locks are being applied to prevent Neil [name] from wandering outside of the home. She reported that he has not fallen since returning home.   Per Neil [name], Neil [name] ability to recognize friends and family members has declined rapidly. She stated that he was unable to recognize his spouse or caretakers today and his MD has placed a referral for further evaluation in [facility]. She reported that his diagnosis of Alzheimer's Disease vs Dementia  and available treatment options will be discussed at that time.    PLAN RN CM will follow up on next week for home visit.    [place] [phone]

## 2018-01-23 DIAGNOSIS — E1122 Type 2 diabetes mellitus with diabetic chronic kidney disease: Secondary | ICD-10-CM | POA: Diagnosis not present

## 2018-01-23 DIAGNOSIS — E11649 Type 2 diabetes mellitus with hypoglycemia without coma: Secondary | ICD-10-CM | POA: Diagnosis not present

## 2018-01-23 DIAGNOSIS — F0390 Unspecified dementia without behavioral disturbance: Secondary | ICD-10-CM | POA: Diagnosis not present

## 2018-01-23 DIAGNOSIS — E43 Unspecified severe protein-calorie malnutrition: Secondary | ICD-10-CM | POA: Diagnosis not present

## 2018-01-23 DIAGNOSIS — I129 Hypertensive chronic kidney disease with stage 1 through stage 4 chronic kidney disease, or unspecified chronic kidney disease: Secondary | ICD-10-CM | POA: Diagnosis not present

## 2018-01-23 DIAGNOSIS — I255 Ischemic cardiomyopathy: Secondary | ICD-10-CM | POA: Diagnosis not present

## 2018-01-25 DIAGNOSIS — E11649 Type 2 diabetes mellitus with hypoglycemia without coma: Secondary | ICD-10-CM | POA: Diagnosis not present

## 2018-01-25 DIAGNOSIS — E1122 Type 2 diabetes mellitus with diabetic chronic kidney disease: Secondary | ICD-10-CM | POA: Diagnosis not present

## 2018-01-25 DIAGNOSIS — I129 Hypertensive chronic kidney disease with stage 1 through stage 4 chronic kidney disease, or unspecified chronic kidney disease: Secondary | ICD-10-CM | POA: Diagnosis not present

## 2018-01-25 DIAGNOSIS — F0390 Unspecified dementia without behavioral disturbance: Secondary | ICD-10-CM | POA: Diagnosis not present

## 2018-01-25 DIAGNOSIS — I255 Ischemic cardiomyopathy: Secondary | ICD-10-CM | POA: Diagnosis not present

## 2018-01-25 DIAGNOSIS — E43 Unspecified severe protein-calorie malnutrition: Secondary | ICD-10-CM | POA: Diagnosis not present

## 2018-01-28 DIAGNOSIS — E11649 Type 2 diabetes mellitus with hypoglycemia without coma: Secondary | ICD-10-CM | POA: Diagnosis not present

## 2018-01-28 DIAGNOSIS — I129 Hypertensive chronic kidney disease with stage 1 through stage 4 chronic kidney disease, or unspecified chronic kidney disease: Secondary | ICD-10-CM | POA: Diagnosis not present

## 2018-01-28 DIAGNOSIS — F0390 Unspecified dementia without behavioral disturbance: Secondary | ICD-10-CM | POA: Diagnosis not present

## 2018-01-28 DIAGNOSIS — E43 Unspecified severe protein-calorie malnutrition: Secondary | ICD-10-CM | POA: Diagnosis not present

## 2018-01-28 DIAGNOSIS — I255 Ischemic cardiomyopathy: Secondary | ICD-10-CM | POA: Diagnosis not present

## 2018-01-28 DIAGNOSIS — E1122 Type 2 diabetes mellitus with diabetic chronic kidney disease: Secondary | ICD-10-CM | POA: Diagnosis not present

## 2018-01-30 DIAGNOSIS — I129 Hypertensive chronic kidney disease with stage 1 through stage 4 chronic kidney disease, or unspecified chronic kidney disease: Secondary | ICD-10-CM | POA: Diagnosis not present

## 2018-01-30 DIAGNOSIS — E11649 Type 2 diabetes mellitus with hypoglycemia without coma: Secondary | ICD-10-CM | POA: Diagnosis not present

## 2018-01-30 DIAGNOSIS — F0390 Unspecified dementia without behavioral disturbance: Secondary | ICD-10-CM | POA: Diagnosis not present

## 2018-01-30 DIAGNOSIS — I255 Ischemic cardiomyopathy: Secondary | ICD-10-CM | POA: Diagnosis not present

## 2018-01-30 DIAGNOSIS — E43 Unspecified severe protein-calorie malnutrition: Secondary | ICD-10-CM | POA: Diagnosis not present

## 2018-01-30 DIAGNOSIS — E1122 Type 2 diabetes mellitus with diabetic chronic kidney disease: Secondary | ICD-10-CM | POA: Diagnosis not present

## 2018-01-31 ENCOUNTER — Other Ambulatory Visit: Payer: Self-pay

## 2018-01-31 DIAGNOSIS — F0390 Unspecified dementia without behavioral disturbance: Secondary | ICD-10-CM | POA: Diagnosis not present

## 2018-01-31 DIAGNOSIS — I129 Hypertensive chronic kidney disease with stage 1 through stage 4 chronic kidney disease, or unspecified chronic kidney disease: Secondary | ICD-10-CM | POA: Diagnosis not present

## 2018-01-31 DIAGNOSIS — E11649 Type 2 diabetes mellitus with hypoglycemia without coma: Secondary | ICD-10-CM | POA: Diagnosis not present

## 2018-01-31 DIAGNOSIS — E43 Unspecified severe protein-calorie malnutrition: Secondary | ICD-10-CM | POA: Diagnosis not present

## 2018-01-31 DIAGNOSIS — E1122 Type 2 diabetes mellitus with diabetic chronic kidney disease: Secondary | ICD-10-CM | POA: Diagnosis not present

## 2018-01-31 DIAGNOSIS — I255 Ischemic cardiomyopathy: Secondary | ICD-10-CM | POA: Diagnosis not present

## 2018-02-01 DIAGNOSIS — E43 Unspecified severe protein-calorie malnutrition: Secondary | ICD-10-CM | POA: Diagnosis not present

## 2018-02-01 DIAGNOSIS — E11649 Type 2 diabetes mellitus with hypoglycemia without coma: Secondary | ICD-10-CM | POA: Diagnosis not present

## 2018-02-01 DIAGNOSIS — I255 Ischemic cardiomyopathy: Secondary | ICD-10-CM | POA: Diagnosis not present

## 2018-02-01 DIAGNOSIS — I129 Hypertensive chronic kidney disease with stage 1 through stage 4 chronic kidney disease, or unspecified chronic kidney disease: Secondary | ICD-10-CM | POA: Diagnosis not present

## 2018-02-01 DIAGNOSIS — E1122 Type 2 diabetes mellitus with diabetic chronic kidney disease: Secondary | ICD-10-CM | POA: Diagnosis not present

## 2018-02-01 DIAGNOSIS — F0390 Unspecified dementia without behavioral disturbance: Secondary | ICD-10-CM | POA: Diagnosis not present

## 2018-02-04 DIAGNOSIS — F0390 Unspecified dementia without behavioral disturbance: Secondary | ICD-10-CM | POA: Diagnosis not present

## 2018-02-04 DIAGNOSIS — E1122 Type 2 diabetes mellitus with diabetic chronic kidney disease: Secondary | ICD-10-CM | POA: Diagnosis not present

## 2018-02-04 DIAGNOSIS — E43 Unspecified severe protein-calorie malnutrition: Secondary | ICD-10-CM | POA: Diagnosis not present

## 2018-02-04 DIAGNOSIS — I129 Hypertensive chronic kidney disease with stage 1 through stage 4 chronic kidney disease, or unspecified chronic kidney disease: Secondary | ICD-10-CM | POA: Diagnosis not present

## 2018-02-04 DIAGNOSIS — E11649 Type 2 diabetes mellitus with hypoglycemia without coma: Secondary | ICD-10-CM | POA: Diagnosis not present

## 2018-02-04 DIAGNOSIS — I255 Ischemic cardiomyopathy: Secondary | ICD-10-CM | POA: Diagnosis not present

## 2018-02-05 DIAGNOSIS — E1122 Type 2 diabetes mellitus with diabetic chronic kidney disease: Secondary | ICD-10-CM | POA: Diagnosis not present

## 2018-02-05 DIAGNOSIS — E43 Unspecified severe protein-calorie malnutrition: Secondary | ICD-10-CM | POA: Diagnosis not present

## 2018-02-05 DIAGNOSIS — E11649 Type 2 diabetes mellitus with hypoglycemia without coma: Secondary | ICD-10-CM | POA: Diagnosis not present

## 2018-02-05 DIAGNOSIS — I129 Hypertensive chronic kidney disease with stage 1 through stage 4 chronic kidney disease, or unspecified chronic kidney disease: Secondary | ICD-10-CM | POA: Diagnosis not present

## 2018-02-05 DIAGNOSIS — I255 Ischemic cardiomyopathy: Secondary | ICD-10-CM | POA: Diagnosis not present

## 2018-02-05 DIAGNOSIS — F0390 Unspecified dementia without behavioral disturbance: Secondary | ICD-10-CM | POA: Diagnosis not present

## 2018-02-06 ENCOUNTER — Other Ambulatory Visit: Payer: Self-pay

## 2018-02-06 DIAGNOSIS — F0390 Unspecified dementia without behavioral disturbance: Secondary | ICD-10-CM | POA: Diagnosis not present

## 2018-02-06 DIAGNOSIS — I129 Hypertensive chronic kidney disease with stage 1 through stage 4 chronic kidney disease, or unspecified chronic kidney disease: Secondary | ICD-10-CM | POA: Diagnosis not present

## 2018-02-06 DIAGNOSIS — E1122 Type 2 diabetes mellitus with diabetic chronic kidney disease: Secondary | ICD-10-CM | POA: Diagnosis not present

## 2018-02-06 DIAGNOSIS — E11649 Type 2 diabetes mellitus with hypoglycemia without coma: Secondary | ICD-10-CM | POA: Diagnosis not present

## 2018-02-06 DIAGNOSIS — E43 Unspecified severe protein-calorie malnutrition: Secondary | ICD-10-CM | POA: Diagnosis not present

## 2018-02-06 DIAGNOSIS — I255 Ischemic cardiomyopathy: Secondary | ICD-10-CM | POA: Diagnosis not present

## 2018-02-08 ENCOUNTER — Other Ambulatory Visit: Payer: Self-pay | Admitting: *Deleted

## 2018-02-08 ENCOUNTER — Ambulatory Visit: Payer: Self-pay | Admitting: *Deleted

## 2018-02-08 NOTE — Patient Outreach (Signed)
[place] [facility]) Care Management  02/08/2018  Neil [name] 09/21/1928 [id number]   CSW met with patient's wife, [name] & their family friend, Modena Nunnery at patient's home to discuss patient's long term care policy. Patient's wife reports that they have been paying for this policy the past 20 years but have encountered issues with filing a claim to get assistance in their home. CSW assisted patient's wife with calling the insurance agency ([place]) to inquire about process, they informed patient's wife that patient's claim had been approved & they just needed to let them know of the care provider they wish to use. CSW asked for the list of providers they accept & CSW assisted [name] with calling [place] [place]. Patient's wife chose [facility] and is waiting for a call back when they can come out to the home to complete an intake assessment. Patient's wife was very grateful for guidance with completing the claim and hopes to be able to get assistance out next week. CSW will follow-up with patient's wife next week to ensure that there is no delay in care & to follow-up with other resources discussed during initial home visit.    Raynaldo Opitz, LCSW [facility]  Clinical Social Worker cell #: [phone]

## 2018-02-12 DIAGNOSIS — E43 Unspecified severe protein-calorie malnutrition: Secondary | ICD-10-CM | POA: Diagnosis not present

## 2018-02-12 DIAGNOSIS — E1122 Type 2 diabetes mellitus with diabetic chronic kidney disease: Secondary | ICD-10-CM | POA: Diagnosis not present

## 2018-02-12 DIAGNOSIS — I255 Ischemic cardiomyopathy: Secondary | ICD-10-CM | POA: Diagnosis not present

## 2018-02-12 DIAGNOSIS — I129 Hypertensive chronic kidney disease with stage 1 through stage 4 chronic kidney disease, or unspecified chronic kidney disease: Secondary | ICD-10-CM | POA: Diagnosis not present

## 2018-02-12 DIAGNOSIS — E11649 Type 2 diabetes mellitus with hypoglycemia without coma: Secondary | ICD-10-CM | POA: Diagnosis not present

## 2018-02-12 DIAGNOSIS — F0390 Unspecified dementia without behavioral disturbance: Secondary | ICD-10-CM | POA: Diagnosis not present

## 2018-02-15 ENCOUNTER — Other Ambulatory Visit: Payer: Self-pay | Admitting: *Deleted

## 2018-02-15 NOTE — Patient Outreach (Signed)
[place] [facility]) Care Management  02/15/2018  YANIV LAGE 11-17-1928 [id number]   CSW called patient's home & spoke with his family-friend, Modena Nunnery (ph#: [phone]) to follow-up on long term care policy and getting a caregiver in to help patient & his wife. [name] informed CSW that she spoke with Juliann Pulse at [place], [place] but they said that they did not have enough caregivers at the moment to help but said they would give them a call when they get more caregivers. [name] is waiting on a call back from [facility] but did speak with [facility] who informed her that it would be $24/hour with a 4 hour minimum (the long term care policy would cover $05/LZJ). [name] asked that CSW call them back in a week to follow-up.    Raynaldo Opitz, LCSW [facility]  Clinical Social Worker cell #: [phone]

## 2018-02-21 ENCOUNTER — Other Ambulatory Visit: Payer: Self-pay | Admitting: *Deleted

## 2018-02-22 ENCOUNTER — Other Ambulatory Visit: Payer: Self-pay

## 2018-02-25 ENCOUNTER — Encounter (HOSPITAL_COMMUNITY): Payer: Self-pay | Admitting: Emergency Medicine

## 2018-02-25 ENCOUNTER — Emergency Department (HOSPITAL_COMMUNITY): Payer: Medicare Other

## 2018-02-25 ENCOUNTER — Inpatient Hospital Stay (HOSPITAL_COMMUNITY)
Admission: EM | Admit: 2018-02-25 | Discharge: 2018-02-27 | DRG: 178 | Disposition: A | Discharge status: Home Health | Payer: Medicare Other | Attending: Internal Medicine | Admitting: Internal Medicine

## 2018-02-25 ENCOUNTER — Other Ambulatory Visit: Payer: Self-pay

## 2018-02-25 DIAGNOSIS — E785 Hyperlipidemia, unspecified: Secondary | ICD-10-CM | POA: Diagnosis present

## 2018-02-25 DIAGNOSIS — I129 Hypertensive chronic kidney disease with stage 1 through stage 4 chronic kidney disease, or unspecified chronic kidney disease: Secondary | ICD-10-CM | POA: Diagnosis present

## 2018-02-25 DIAGNOSIS — N390 Urinary tract infection, site not specified: Secondary | ICD-10-CM

## 2018-02-25 DIAGNOSIS — E1169 Type 2 diabetes mellitus with other specified complication: Secondary | ICD-10-CM | POA: Diagnosis present

## 2018-02-25 DIAGNOSIS — I482 Chronic atrial fibrillation: Secondary | ICD-10-CM | POA: Diagnosis present

## 2018-02-25 DIAGNOSIS — Z7901 Long term (current) use of anticoagulants: Secondary | ICD-10-CM

## 2018-02-25 DIAGNOSIS — N183 Chronic kidney disease, stage 3 (moderate): Secondary | ICD-10-CM | POA: Diagnosis present

## 2018-02-25 DIAGNOSIS — Z7902 Long term (current) use of antithrombotics/antiplatelets: Secondary | ICD-10-CM

## 2018-02-25 DIAGNOSIS — F0390 Unspecified dementia without behavioral disturbance: Secondary | ICD-10-CM | POA: Diagnosis present

## 2018-02-25 DIAGNOSIS — Z951 Presence of aortocoronary bypass graft: Secondary | ICD-10-CM

## 2018-02-25 DIAGNOSIS — I10 Essential (primary) hypertension: Secondary | ICD-10-CM | POA: Diagnosis not present

## 2018-02-25 DIAGNOSIS — R918 Other nonspecific abnormal finding of lung field: Secondary | ICD-10-CM | POA: Diagnosis not present

## 2018-02-25 DIAGNOSIS — J189 Pneumonia, unspecified organism: Secondary | ICD-10-CM

## 2018-02-25 DIAGNOSIS — Z6823 Body mass index (BMI) 23.0-23.9, adult: Secondary | ICD-10-CM

## 2018-02-25 DIAGNOSIS — I701 Atherosclerosis of renal artery: Secondary | ICD-10-CM | POA: Diagnosis present

## 2018-02-25 DIAGNOSIS — E43 Unspecified severe protein-calorie malnutrition: Secondary | ICD-10-CM | POA: Diagnosis present

## 2018-02-25 DIAGNOSIS — R627 Adult failure to thrive: Secondary | ICD-10-CM | POA: Diagnosis present

## 2018-02-25 DIAGNOSIS — Z880 Allergy status to penicillin: Secondary | ICD-10-CM | POA: Diagnosis not present

## 2018-02-25 DIAGNOSIS — Z950 Presence of cardiac pacemaker: Secondary | ICD-10-CM | POA: Diagnosis not present

## 2018-02-25 DIAGNOSIS — N179 Acute kidney failure, unspecified: Secondary | ICD-10-CM | POA: Diagnosis present

## 2018-02-25 DIAGNOSIS — Z7984 Long term (current) use of oral hypoglycemic drugs: Secondary | ICD-10-CM

## 2018-02-25 DIAGNOSIS — J690 Pneumonitis due to inhalation of food and vomit: Secondary | ICD-10-CM | POA: Diagnosis not present

## 2018-02-25 DIAGNOSIS — I2510 Atherosclerotic heart disease of native coronary artery without angina pectoris: Secondary | ICD-10-CM | POA: Diagnosis present

## 2018-02-25 DIAGNOSIS — I4821 Permanent atrial fibrillation: Secondary | ICD-10-CM | POA: Diagnosis present

## 2018-02-25 DIAGNOSIS — I255 Ischemic cardiomyopathy: Secondary | ICD-10-CM | POA: Diagnosis present

## 2018-02-25 DIAGNOSIS — J181 Lobar pneumonia, unspecified organism: Secondary | ICD-10-CM

## 2018-02-25 DIAGNOSIS — Z79899 Other long term (current) drug therapy: Secondary | ICD-10-CM

## 2018-02-25 LAB — COMPREHENSIVE METABOLIC PANEL
ALT: 17 U/L (ref 0–44)
ANION GAP: 9 (ref 5–15)
AST: 21 U/L (ref 15–41)
Albumin: 3 g/dL — ABNORMAL LOW (ref 3.5–5.0)
Alkaline Phosphatase: 78 U/L (ref 38–126)
BUN: 50 mg/dL — ABNORMAL HIGH (ref 8–23)
CHLORIDE: 103 mmol/L (ref 98–111)
CO2: 24 mmol/L (ref 22–32)
CREATININE: 1.43 mg/dL — AB (ref 0.61–1.24)
Calcium: 8.4 mg/dL — ABNORMAL LOW (ref 8.9–10.3)
GFR, EST AFRICAN AMERICAN: 49 mL/min — AB (ref >60)
GFR, EST NON AFRICAN AMERICAN: 42 mL/min — AB (ref >60)
Glucose, Bld: 108 mg/dL — ABNORMAL HIGH (ref 70–99)
Potassium: 4.6 mmol/L (ref 3.5–5.1)
SODIUM: 136 mmol/L (ref 135–145)
Total Bilirubin: 1.1 mg/dL (ref 0.3–1.2)
Total Protein: 6.5 g/dL (ref 6.5–8.1)

## 2018-02-25 LAB — CBC WITH DIFFERENTIAL/PLATELET
Basophils Absolute: 0 10*3/uL (ref 0.0–0.1)
Basophils Relative: 0 %
EOS ABS: 0.1 10*3/uL (ref 0.0–0.7)
Eosinophils Relative: 1 %
HEMATOCRIT: 31 % — AB (ref 39.0–52.0)
HEMOGLOBIN: 10.2 g/dL — AB (ref 13.0–17.0)
LYMPHS ABS: 0.5 10*3/uL — AB (ref 0.7–4.0)
Lymphocytes Relative: 5 %
MCH: 29.1 pg (ref 26.0–34.0)
MCHC: 32.9 g/dL (ref 30.0–36.0)
MCV: 88.6 fL (ref 78.0–100.0)
Monocytes Absolute: 0.9 10*3/uL (ref 0.1–1.0)
Monocytes Relative: 9 %
NEUTROS ABS: 8.5 10*3/uL — AB (ref 1.7–7.7)
NEUTROS PCT: 85 %
Platelets: 214 10*3/uL (ref 150–400)
RBC: 3.5 MIL/uL — ABNORMAL LOW (ref 4.22–5.81)
RDW: 14.2 % (ref 11.5–15.5)
WBC: 10 10*3/uL (ref 4.0–10.5)

## 2018-02-25 LAB — URINALYSIS, ROUTINE W REFLEX MICROSCOPIC
BILIRUBIN URINE: NEGATIVE
Glucose, UA: NEGATIVE mg/dL
Ketones, ur: NEGATIVE mg/dL
Leukocytes, UA: NEGATIVE
NITRITE: POSITIVE — AB
PROTEIN: NEGATIVE mg/dL
Specific Gravity, Urine: 1.009 (ref 1.005–1.030)
pH: 5 (ref 5.0–8.0)

## 2018-02-25 LAB — LACTIC ACID, PLASMA
LACTIC ACID, VENOUS: 1.1 mmol/L (ref 0.5–1.9)
Lactic Acid, Venous: 0.8 mmol/L (ref 0.5–1.9)

## 2018-02-25 LAB — LIPASE, BLOOD: LIPASE: 26 U/L (ref 11–51)

## 2018-02-25 LAB — I-STAT CG4 LACTIC ACID, ED: LACTIC ACID, VENOUS: 1.58 mmol/L (ref 0.5–1.9)

## 2018-02-25 MED ORDER — ACETAMINOPHEN 650 MG RE SUPP: 650.0000 mg | Freq: Four times a day (QID) | RECTAL | Status: DC | PRN

## 2018-02-25 MED ORDER — LACTATED RINGERS IV SOLN
INTRAVENOUS | Status: DC
  Administered 2018-02-26: via INTRAVENOUS

## 2018-02-25 MED ORDER — ACETAMINOPHEN 325 MG PO TABS: 650.0000 mg | ORAL_TABLET | Freq: Four times a day (QID) | ORAL | Status: DC | PRN

## 2018-02-25 MED ORDER — SODIUM CHLORIDE 0.9 % IV SOLN
1.0000 g | Freq: Once | INTRAVENOUS | Status: AC
  Administered 2018-02-25: 1 g via INTRAVENOUS
  Filled 2018-02-25: qty 10

## 2018-02-25 MED ORDER — INSULIN ASPART 100 UNIT/ML ~~LOC~~ SOLN
0.0000 [IU] | Freq: Three times a day (TID) | SUBCUTANEOUS | Status: DC
  Administered 2018-02-26: 3 [IU] via SUBCUTANEOUS
  Administered 2018-02-27: 1 [IU] via SUBCUTANEOUS
  Administered 2018-02-27: 3 [IU] via SUBCUTANEOUS

## 2018-02-25 MED ORDER — ONDANSETRON HCL 4 MG PO TABS: 4.0000 mg | ORAL_TABLET | Freq: Four times a day (QID) | ORAL | Status: DC | PRN

## 2018-02-25 MED ORDER — SODIUM CHLORIDE 0.9 % IV BOLUS
1000.0000 mL | Freq: Once | INTRAVENOUS | Status: AC
  Administered 2018-02-25: 1000 mL via INTRAVENOUS

## 2018-02-25 MED ORDER — DABIGATRAN ETEXILATE MESYLATE 75 MG PO CAPS
75.0000 mg | ORAL_CAPSULE | Freq: Every day | ORAL | Status: DC
  Administered 2018-02-26 – 2018-02-27 (×2): 75 mg via ORAL
  Filled 2018-02-25 (×2): qty 1

## 2018-02-25 MED ORDER — SODIUM CHLORIDE 0.9 % IV SOLN
500.0000 mg | INTRAVENOUS | Status: DC
  Administered 2018-02-25: 500 mg via INTRAVENOUS
  Filled 2018-02-25: qty 500

## 2018-02-25 MED ORDER — IPRATROPIUM-ALBUTEROL 0.5-2.5 (3) MG/3ML IN SOLN: 3.0000 mL | Freq: Four times a day (QID) | RESPIRATORY_TRACT | Status: DC | PRN

## 2018-02-25 MED ORDER — CALCITRIOL 0.25 MCG PO CAPS
0.2500 ug | ORAL_CAPSULE | ORAL | Status: DC
  Administered 2018-02-27: 0.25 ug via ORAL
  Filled 2018-02-25: qty 1

## 2018-02-25 MED ORDER — ENSURE ENLIVE PO LIQD
237.0000 mL | Freq: Two times a day (BID) | ORAL | Status: DC
  Administered 2018-02-26 (×2): 237 mL via ORAL

## 2018-02-25 MED ORDER — INSULIN ASPART 100 UNIT/ML ~~LOC~~ SOLN
0.0000 [IU] | Freq: Every day | SUBCUTANEOUS | Status: DC
  Administered 2018-02-26: 2 [IU] via SUBCUTANEOUS

## 2018-02-25 MED ORDER — ACETAMINOPHEN 500 MG PO TABS
1000.0000 mg | ORAL_TABLET | Freq: Once | ORAL | Status: AC
  Administered 2018-02-25: 1000 mg via ORAL
  Filled 2018-02-25: qty 2

## 2018-02-25 MED ORDER — MEGESTROL ACETATE 625 MG/5ML PO SUSP: 625.0000 mg | Freq: Every day | ORAL | Status: DC

## 2018-02-25 MED ORDER — ONDANSETRON HCL 4 MG/2ML IJ SOLN: 4.0000 mg | Freq: Four times a day (QID) | INTRAMUSCULAR | Status: DC | PRN

## 2018-02-25 MED ORDER — ATORVASTATIN CALCIUM 40 MG PO TABS
40.0000 mg | ORAL_TABLET | Freq: Every day | ORAL | Status: DC
  Administered 2018-02-26 – 2018-02-27 (×2): 40 mg via ORAL
  Filled 2018-02-25 (×2): qty 1

## 2018-02-25 NOTE — H&P (Addendum)
History and Physical    Neil [name] MGQ:[id number] DOB: 09/26/1928 DOA: 02/25/2018  PCP: Redmond School, MD   Patient coming from: Home  Chief Complaint: Fever  HPI: Neil [name] is a 84 y.o. male with medical history significant for dementia, failure to thrive, permanent atrial fibrillation with prior pacemaker placement on Pradaxa, type 2 diabetes, CAD, dyslipidemia, hypertension, and protein calorie malnutrition who was brought to the ED upon developing a fever of 100.2 Fahrenheit as well as some rigors and chills shortly before dinnertime.  His caretaker at the bedside states that patient has had trouble with aspiration previously and has had some choking and trouble swallowing his medications in the last 2 to 3 days.  Other than the fever, no significant dyspnea, cough, chest pain, edema, or any other symptoms have been noted.   ED Course: Vital signs are stable and patient is afebrile in the ED.  Laboratory data is unremarkable aside from a chronic anemia with hemoglobin 10.2 and BUN elevated at 50 as well as creatinine elevation of 1.43 from previous baseline of 0.9-1.  2 view chest x-ray demonstrates what appears to be a left lower lobe infiltrate with stable cardiomegaly.  Urine analysis demonstrates signs of UTI.  He had been started on azithromycin and Rocephin in the ED and has been given 1 L fluid.  He is currently resting comfortably and in no acute respiratory distress.  Review of Systems: Cannot be adequately obtained due to patient dementia.  Past Medical History:  Diagnosis Date  . Atrial fibrillation ([place])   . Chronic kidney disease (CKD), stage III (moderate) (HCC)   . Complete heart block ([place])   . Coronary artery disease    status post CABG1999  . Dementia   . Diabetes mellitus   . Dyslipidemia   . Hypertension   . Osteoarthritis   . Pacemaker    [employer] status post generator replacement 2007  . Renal artery stenosis (HCC)    95%  left, 50% right     Past Surgical History:  Procedure Laterality Date  . CORONARY ARTERY BYPASS GRAFT     with stenting  . PACEMAKER INSERTION       reports that he has never smoked. He has never used smokeless tobacco. He reports that he does not drink alcohol or use drugs.  Allergies  Allergen Reactions  . Penicillins Swelling and Rash    Itching Has patient had a PCN reaction causing immediate rash, facial/tongue/throat swelling, SOB or lightheadedness with hypotension: Yes Has patient had a PCN reaction causing severe rash involving mucus membranes or skin necrosis: Yes Has patient had a PCN reaction that required hospitalization: No Has patient had a PCN reaction occurring within the last 10 years: No If all of the above answers are "NO", then may proceed with Cephalosporin use.    Family History  Problem Relation Age of Onset  . Other Mother   . Cancer Mother        breast  . Alzheimer's disease Sister     Prior to Admission medications   Medication Sig Start Date End Date Taking? Authorizing Provider  atorvastatin (LIPITOR) 40 MG tablet Take 40 mg by mouth daily.     Yes [provider]  calcitRIOL (ROCALTROL) 0.25 MCG capsule Take 0.25 mcg by mouth every Monday, Wednesday, and Friday.   Yes [provider]  carvedilol (COREG) 6.25 MG tablet Take 6.25 mg by mouth every morning.    Yes [provider]  dabigatran (PRADAXA) 75 MG CAPS capsule Take 1 capsule (75 mg total) by mouth every 12 (twelve) hours. Patient taking differently: Take 75 mg by mouth daily.  11/11/17  Yes Isaac Bliss, Rayford Halsted, MD  feeding supplement, ENSURE ENLIVE, (ENSURE ENLIVE) LIQD Take 237 mLs by mouth 2 (two) times daily between meals. 12/18/17  Yes Keyandra Swenson D, DO  glipiZIDE (GLUCOTROL XL) 5 MG 24 hr tablet Take 5 mg by mouth daily.    Yes Redmond School, MD  losartan (COZAAR) 50 MG tablet Take 50 mg by mouth daily.   Yes [provider]  megestrol (MEGACE ES) 625 MG/5ML  suspension Take 5 mLs (625 mg total) by mouth daily. Patient not taking: Reported on 01/07/2018 12/18/17   Heath Lark D, DO    Physical Exam: Vitals:   02/25/18 2200 02/25/18 2230 02/25/18 2301 02/25/18 2330  BP: (!) 107/51 100/64 (!) 99/56 98/61  Pulse: 79 77 77 78  Resp: 15 16 15  (!) 23  Temp:      TempSrc:      SpO2: 96% 96% 97% 94%  Weight:      Height:        Constitutional: NAD, calm, comfortable Vitals:   02/25/18 2200 02/25/18 2230 02/25/18 2301 02/25/18 2330  BP: (!) 107/51 100/64 (!) 99/56 98/61  Pulse: 79 77 77 78  Resp: 15 16 15  (!) 23  Temp:      TempSrc:      SpO2: 96% 96% 97% 94%  Weight:      Height:       Eyes: lids and conjunctivae normal ENMT: Mucous membranes are moist.  Neck: normal, supple Respiratory: clear to auscultation bilaterally. Normal respiratory effort. No accessory muscle use.  Cardiovascular: Regular rate and rhythm, no murmurs. No extremity edema. Abdomen: no tenderness, no distention. Bowel sounds positive.  Musculoskeletal:  No joint deformity upper and lower extremities.   Skin: no rashes, lesions, ulcers.  Psychiatric: Cannot be adequately assessed.  Labs on Admission: I have personally reviewed following labs and imaging studies  CBC: Recent Labs  Lab 02/25/18 2039  WBC 10.0  NEUTROABS 8.5*  HGB 10.2*  HCT 31.0*  MCV 88.6  PLT 960   Basic Metabolic Panel: Recent Labs  Lab 02/25/18 2039  NA 136  K 4.6  CL 103  CO2 24  GLUCOSE 108*  BUN 50*  CREATININE 1.43*  CALCIUM 8.4*   GFR: Estimated Creatinine Clearance: 35 mL/min (A) (by C-G formula based on SCr of 1.43 mg/dL (H)). Liver Function Tests: Recent Labs  Lab 02/25/18 2039  AST 21  ALT 17  ALKPHOS 78  BILITOT 1.1  PROT 6.5  ALBUMIN 3.0*   Recent Labs  Lab 02/25/18 2039  LIPASE 26   No results for input(s): AMMONIA in the last 168 hours. Coagulation Profile: No results for input(s): INR, PROTIME in the last 168 hours. Cardiac Enzymes: No  results for input(s): CKTOTAL, CKMB, CKMBINDEX, TROPONINI in the last 168 hours. BNP (last 3 results) No results for input(s): PROBNP in the last 8760 hours. HbA1C: No results for input(s): HGBA1C in the last 72 hours. CBG: No results for input(s): GLUCAP in the last 168 hours. Lipid Profile: No results for input(s): CHOL, HDL, LDLCALC, TRIG, CHOLHDL, LDLDIRECT in the last 72 hours. Thyroid Function Tests: No results for input(s): TSH, T4TOTAL, FREET4, T3FREE, THYROIDAB in the last 72 hours. Anemia Panel: No results for input(s): VITAMINB12, FOLATE, FERRITIN, TIBC, IRON, RETICCTPCT in the last 72 hours. Urine analysis:  Component Value Date/Time   COLORURINE YELLOW 02/25/2018 2011   APPEARANCEUR CLEAR 02/25/2018 2011   LABSPEC 1.009 02/25/2018 2011   PHURINE 5.0 02/25/2018 2011   GLUCOSEU NEGATIVE 02/25/2018 2011   HGBUR SMALL (A) 02/25/2018 2011   BILIRUBINUR NEGATIVE 02/25/2018 2011   KETONESUR NEGATIVE 02/25/2018 2011   PROTEINUR NEGATIVE 02/25/2018 2011   NITRITE POSITIVE (A) 02/25/2018 2011   LEUKOCYTESUR NEGATIVE 02/25/2018 2011    Radiological Exams on Admission: Dg Chest 2 View  Result Date: 02/25/2018 CLINICAL DATA:  Fever.  Possible aspiration. EXAM: CHEST - 2 VIEW COMPARISON:  Chest x-rays dated 12/17/2017 and 11/09/2017. FINDINGS: Stable cardiomegaly. Median sternotomy wires appear stable in alignment. Surgical changes of presumed CABG. Pacemaker leads appear stable in position. Vague opacity at the LEFT lung base, likely a combination of consolidation and small pleural effusion. IMPRESSION: 1. Vague opacity at the LEFT lung base, likely a combination of consolidation (pneumonia or aspiration) and small pleural effusion. 2. Stable cardiomegaly. Electronically Signed   By: Franki Cabot M.D.   On: 02/25/2018 21:01    Assessment/Plan Principal Problem:   Aspiration pneumonia (HCC) Active Problems:   Type 2 diabetes mellitus with hyperlipidemia (HCC)    Cardiomyopathy, ischemic   Permanent atrial fibrillation (HCC)   HYPERTENSION, BENIGN   FTT (failure to thrive) in adult   AKI (acute kidney injury) ([place])   Dementia   Protein-calorie malnutrition, severe   UTI (urinary tract infection)    1. Fever secondary to presumed aspiration pneumonia as well as UTI.  Urine cultures obtained in ED.  We will switch antibiotics to Rocephin and Flagyl for aspiration coverage (due to PCN allergy) and check a.m. lactic acid level as well as procalcitonin.  Duo nebs as needed for shortness of breath or wheezing.  No cough currently noted.  Continue to monitor fevers and transition to oral antibiotics as tolerated.  Dysphasia 3 diet for now with SLP evaluation in a.m. 2. AKI.  Continue on gentle IV fluid hydration and avoid nephrotoxic agents.  Monitor strict I's and O's.  Repeat renal panel in a.m. 3. Permanent atrial fibrillation.  Continue on Pradaxa holding Coreg for now due to soft blood pressure readings.  Rate controlled currently and patient has pacemaker. 4. Type 2 diabetes.  Will maintain on sliding scale insulin while here and avoid glipizide. 5. Hypertension.  We will hold blood pressure agents for now due to soft blood pressure readings and maintain on gentle IV fluid. 6. Dyslipidemia.  Continue statin. 7. Protein calorie malnutrition.  Continue megestrol and feeding supplements.   DVT prophylaxis: Pradaxa Code Status: Full Family Communication: Wife and caretaker at bedside Disposition Plan:Empiric antibiotic treatment and culture evaluation Consults called:None Admission status: Inpatient, Med-Surg   [place] [facility] Pager [phone]  If 7PM-7AM, please contact night-coverage [email or web address] Password [facility]  02/25/2018, 11:42 PM

## 2018-02-25 NOTE — ED Provider Notes (Signed)
[facility] EMERGENCY DEPARTMENT Provider Note   CSN: [id number] Arrival date & time: 02/25/18  1933     History   Chief Complaint Chief Complaint  Patient presents with  . Fever    HPI Neil [name] is a 84 y.o. male.  Level 5 caveat for dementia.  Patient had been doing well today until approximately dinnertime when he developed a fever (100.2) and chills.  No prodromal illnesses.  He states he is "feeling okay".  Pt has multiple health issues including atrial fibrillation, chronic kidney disease, CAD, diabetes, dementia, pacemaker, several others     Past Medical History:  Diagnosis Date  . Atrial fibrillation ([place])   . Chronic kidney disease (CKD), stage III (moderate) (HCC)   . Complete heart block ([place])   . Coronary artery disease    status post CABG1999  . Dementia   . Diabetes mellitus   . Dyslipidemia   . Hypertension   . Osteoarthritis   . Pacemaker    St Jude status post generator replacement 2007  . Renal artery stenosis (HCC)    95%  left, 50% right    Patient Active Problem List   Diagnosis Date Noted  . Pneumonia 02/25/2018  . Protein-calorie malnutrition, severe 12/18/2017  . Hypoglycemia due to type 2 diabetes mellitus ([place]) 12/17/2017  . Confusion   . General weakness   . Dehydration 11/09/2017  . FTT (failure to thrive) in adult 11/09/2017  . AKI (acute kidney injury) ([place]) 11/09/2017  . Dementia 11/09/2017  . Renal artery stenosis ([place])   . CKD (chronic kidney disease) stage 3, GFR 30-59 ml/min (HCC)   . Pacemaker-St. Jude 04/20/2011  . Permanent atrial fibrillation ([place]) 05/03/2010  . AV BLOCK, COMPLETE 11/02/2009  . ABDOMINAL PAIN-EPIGASTRIC 10/27/2008  . AODM 07/25/2008  . DYSLIPIDEMIA 07/25/2008  . Cardiomyopathy, ischemic 07/25/2008  . ATHEROSCLEROSIS OF RENAL ARTERY 07/25/2008  . HYPERTENSION, BENIGN 07/25/2008    Past Surgical History:  Procedure Laterality Date  . CORONARY ARTERY BYPASS GRAFT     with stenting  .  PACEMAKER INSERTION          Home Medications    Prior to Admission medications   Medication Sig Start Date End Date Taking? Authorizing Provider  atorvastatin (LIPITOR) 40 MG tablet Take 40 mg by mouth daily.     Yes [provider]  calcitRIOL (ROCALTROL) 0.25 MCG capsule Take 0.25 mcg by mouth every Monday, Wednesday, and Friday.   Yes [provider]  carvedilol (COREG) 6.25 MG tablet Take 6.25 mg by mouth every morning.    Yes [provider]  dabigatran (PRADAXA) 75 MG CAPS capsule Take 1 capsule (75 mg total) by mouth every 12 (twelve) hours. Patient taking differently: Take 75 mg by mouth daily.  11/11/17  Yes Isaac Bliss, Rayford Halsted, MD  feeding supplement, ENSURE ENLIVE, (ENSURE ENLIVE) LIQD Take 237 mLs by mouth 2 (two) times daily between meals. 12/18/17  Yes Shah, Pratik D, DO  glipiZIDE (GLUCOTROL XL) 5 MG 24 hr tablet Take 5 mg by mouth daily.    Yes Redmond School, MD  losartan (COZAAR) 50 MG tablet Take 50 mg by mouth daily.   Yes [provider]  megestrol (MEGACE ES) 625 MG/5ML suspension Take 5 mLs (625 mg total) by mouth daily. Patient not taking: Reported on 01/07/2018 12/18/17   Heath Lark D, DO    Family History Family History  Problem Relation Age of Onset  . Other Mother   . Cancer Mother  breast  . Alzheimer's disease Sister     Social History Social History   Tobacco Use  . Smoking status: Never Smoker  . Smokeless tobacco: Never Used  Substance Use Topics  . Alcohol use: No  . Drug use: No     Allergies   Penicillins   Review of Systems Review of Systems  Unable to perform ROS: Dementia     Physical Exam Updated Vital Signs BP (!) 99/56   Pulse 77   Temp 98.4 F (36.9 C) (Oral)   Resp 15   Ht 5\' 9"  (1.753 m)   Wt 68 kg   SpO2 97%   BMI 22.14 kg/m   Physical Exam  Constitutional:  Pale, questionable jaundiced, able to answer questions  HENT:  Head: Normocephalic and atraumatic.    Eyes: Conjunctivae are normal.  Neck: Neck supple.  Cardiovascular: Normal rate and regular rhythm.  Pulmonary/Chest: Effort normal and breath sounds normal.  Abdominal: Soft. Bowel sounds are normal.  Musculoskeletal: Normal range of motion.  Neurological:  Moves all extremities  Skin: Skin is warm and dry.  Psychiatric:  Demented  Nursing note and vitals reviewed.    ED Treatments / Results  Labs (all labs ordered are listed, but only abnormal results are displayed) Labs Reviewed  COMPREHENSIVE METABOLIC PANEL - Abnormal; Notable for the following components:      Result Value   Glucose, Bld 108 (*)    BUN 50 (*)    Creatinine, Ser 1.43 (*)    Calcium 8.4 (*)    Albumin 3.0 (*)    GFR calc non Af Amer 42 (*)    GFR calc Af Amer 49 (*)    All other components within normal limits  CBC WITH DIFFERENTIAL/PLATELET - Abnormal; Notable for the following components:   RBC 3.50 (*)    Hemoglobin 10.2 (*)    HCT 31.0 (*)    Neutro Abs 8.5 (*)    Lymphs Abs 0.5 (*)    All other components within normal limits  URINALYSIS, ROUTINE W REFLEX MICROSCOPIC - Abnormal; Notable for the following components:   Hgb urine dipstick SMALL (*)    Nitrite POSITIVE (*)    Bacteria, UA RARE (*)    All other components within normal limits  CULTURE, BLOOD (ROUTINE X 2)  CULTURE, BLOOD (ROUTINE X 2)  URINE CULTURE  LIPASE, BLOOD  LACTIC ACID, PLASMA  LACTIC ACID, PLASMA  I-STAT CG4 LACTIC ACID, ED    EKG None  Radiology Dg Chest 2 View  Result Date: 02/25/2018 CLINICAL DATA:  Fever.  Possible aspiration. EXAM: CHEST - 2 VIEW COMPARISON:  Chest x-rays dated 12/17/2017 and 11/09/2017. FINDINGS: Stable cardiomegaly. Median sternotomy wires appear stable in alignment. Surgical changes of presumed CABG. Pacemaker leads appear stable in position. Vague opacity at the LEFT lung base, likely a combination of consolidation and small pleural effusion. IMPRESSION: 1. Vague opacity at the LEFT  lung base, likely a combination of consolidation (pneumonia or aspiration) and small pleural effusion. 2. Stable cardiomegaly. Electronically Signed   By: Franki Cabot M.D.   On: 02/25/2018 21:01    Procedures Procedures (including critical care time)  Medications Ordered in ED Medications  azithromycin (ZITHROMAX) 500 mg in sodium chloride 0.9 % 250 mL IVPB (500 mg Intravenous New Bag/Given 02/25/18 2220)  acetaminophen (TYLENOL) tablet 1,000 mg (1,000 mg Oral Given 02/25/18 2024)  sodium chloride 0.9 % bolus 1,000 mL (0 mLs Intravenous Stopped 02/25/18 2249)  cefTRIAXone (ROCEPHIN) 1 g in  sodium chloride 0.9 % 100 mL IVPB (0 g Intravenous Stopped 02/25/18 2220)     Initial Impression / Assessment and Plan / ED Course  I have reviewed the triage vital signs and the nursing notes.  Pertinent labs & imaging results that were available during my care of the patient were reviewed by me and considered in my medical decision making (see chart for details).     Patient presents with fever and chills.  Urinalysis shows infection.  Chest x-ray reveals a vague opacity at the left base.  Urine culture.  IV Rocephin, IV Zithromax, admit to general medicine.  CRITICAL CARE Performed by: Nat Christen Total critical care time: 30 minutes Critical care time was exclusive of separately billable procedures and treating other patients. Critical care was necessary to treat or prevent imminent or life-threatening deterioration. Critical care was time spent personally by me on the following activities: development of treatment plan with patient and/or surrogate as well as nursing, discussions with consultants, evaluation of patient's response to treatment, examination of patient, obtaining history from patient or surrogate, ordering and performing treatments and interventions, ordering and review of laboratory studies, ordering and review of radiographic studies, pulse oximetry and re-evaluation of patient's  condition.  Final Clinical Impressions(s) / ED Diagnoses   Final diagnoses:  Urinary tract infection without hematuria, site unspecified  Community acquired pneumonia of left lower lobe of lung [facility])    ED Discharge Orders    None       Nat Christen, MD 02/25/18 2320

## 2018-02-25 NOTE — ED Triage Notes (Signed)
Caregiver brought pt in for eval of fever, pt has hx of dementia

## 2018-02-25 NOTE — Patient Outreach (Signed)
[facility]) Care Management  02/22/2018  Neil [name] Aug 20, 1928 [id number]   RN CM placed call for routine outreach. Spoke with Neil [name] spouse Neil [name]. HIPAA verifiers obtained. She reported that Neil [name] was doing well. No falls reported. Member has a pending follow up appointment with Dr. Gerarda Fraction on next week. Spouse was agreeable to follow up call after MD visit to discuss Neil [name]'s medications and treatment updates.  PLAN RN CM will follow up on next week.   [place] [phone]

## 2018-02-26 ENCOUNTER — Ambulatory Visit: Payer: Self-pay

## 2018-02-26 LAB — CBC
HCT: 28.5 % — ABNORMAL LOW (ref 39.0–52.0)
HEMOGLOBIN: 9.2 g/dL — AB (ref 13.0–17.0)
MCH: 28.8 pg (ref 26.0–34.0)
MCHC: 32.3 g/dL (ref 30.0–36.0)
MCV: 89.1 fL (ref 78.0–100.0)
PLATELETS: 181 10*3/uL (ref 150–400)
RBC: 3.2 MIL/uL — AB (ref 4.22–5.81)
RDW: 14.1 % (ref 11.5–15.5)
WBC: 6.1 10*3/uL (ref 4.0–10.5)

## 2018-02-26 LAB — BASIC METABOLIC PANEL
ANION GAP: 7 (ref 5–15)
BUN: 42 mg/dL — ABNORMAL HIGH (ref 8–23)
CALCIUM: 8.1 mg/dL — AB (ref 8.9–10.3)
CO2: 25 mmol/L (ref 22–32)
Chloride: 107 mmol/L (ref 98–111)
Creatinine, Ser: 1.28 mg/dL — ABNORMAL HIGH (ref 0.61–1.24)
GFR calc non Af Amer: 49 mL/min — ABNORMAL LOW (ref >60)
GFR, EST AFRICAN AMERICAN: 56 mL/min — AB (ref >60)
Glucose, Bld: 84 mg/dL (ref 70–99)
Potassium: 3.9 mmol/L (ref 3.5–5.1)
SODIUM: 139 mmol/L (ref 135–145)

## 2018-02-26 LAB — GLUCOSE, CAPILLARY
GLUCOSE-CAPILLARY: 103 mg/dL — AB (ref 70–99)
GLUCOSE-CAPILLARY: 220 mg/dL — AB (ref 70–99)
GLUCOSE-CAPILLARY: 67 mg/dL — AB (ref 70–99)
GLUCOSE-CAPILLARY: 99 mg/dL (ref 70–99)
Glucose-Capillary: 84 mg/dL (ref 70–99)
Glucose-Capillary: 201 mg/dL — ABNORMAL HIGH (ref 70–99)

## 2018-02-26 LAB — PROCALCITONIN: PROCALCITONIN: 0.17 ng/mL

## 2018-02-26 LAB — LACTIC ACID, PLASMA: LACTIC ACID, VENOUS: 0.9 mmol/L (ref 0.5–1.9)

## 2018-02-26 MED ORDER — METRONIDAZOLE IN NACL 5-0.79 MG/ML-% IV SOLN
500.0000 mg | Freq: Three times a day (TID) | INTRAVENOUS | Status: DC
  Administered 2018-02-26 – 2018-02-27 (×5): 500 mg via INTRAVENOUS
  Filled 2018-02-26 (×5): qty 100

## 2018-02-26 MED ORDER — PANTOPRAZOLE SODIUM 40 MG PO TBEC
40.0000 mg | DELAYED_RELEASE_TABLET | Freq: Every day | ORAL | Status: DC
  Administered 2018-02-26 – 2018-02-27 (×2): 40 mg via ORAL
  Filled 2018-02-26 (×2): qty 1

## 2018-02-26 MED ORDER — SODIUM CHLORIDE 0.9 % IV SOLN
1.0000 g | INTRAVENOUS | Status: DC
  Administered 2018-02-26 – 2018-02-27 (×2): 1 g via INTRAVENOUS
  Filled 2018-02-26: qty 1
  Filled 2018-02-26 (×2): qty 10

## 2018-02-26 MED ORDER — CARVEDILOL 3.125 MG PO TABS
3.1250 mg | ORAL_TABLET | Freq: Two times a day (BID) | ORAL | Status: DC
  Administered 2018-02-26 – 2018-02-27 (×2): 3.125 mg via ORAL
  Filled 2018-02-26 (×2): qty 1

## 2018-02-26 NOTE — Progress Notes (Signed)
PROGRESS NOTE    Neil [name]  XBJ:[id number] DOB: Jan 04, 1929 DOA: 02/25/2018 PCP: Redmond School, MD    Brief Narrative:  84 year old male with a history of dementia, atrial fibrillation, ischemic cardiomyopathy, brought to the hospital with fever.  Found to have UTI probable aspiration pneumonia.  Started on intravenous antibiotics.  Slowly improving.   Assessment & Plan:   Principal Problem:   Aspiration pneumonia ([place]) Active Problems:   Type 2 diabetes mellitus with hyperlipidemia (HCC)   Cardiomyopathy, ischemic   Permanent atrial fibrillation (HCC)   HYPERTENSION, BENIGN   FTT (failure to thrive) in adult   AKI (acute kidney injury) ([place])   Dementia   Protein-calorie malnutrition, severe   UTI (urinary tract infection)   1. Urinary tract infection.  Currently on Rocephin.  Follow-up cultures 2. Pneumonia, presumed aspiration.  Family reports frequent episodes of reflux.  Will start on PPI.  He is already on antibiotics.  Fevers appear to have resolved.  Continue current treatments.  Therapy is consulted. 3. Acute kidney injury.  Likely prerenal.  Some improvement with hydration.  Will hold off on further IV fluids so as not to precipitate heart failure.  Losartan currently on hold 4. Permanent atrial fibrillation.  Anticoagulated with Pradaxa.  He is chronically on Coreg which was held for low blood pressures.  Since blood pressures are stable, will restart Coreg at low dose. 5. Diabetes.  Continue on sliding scale insulin.  Holding glipizide for now.  Blood sugar stable. 6. Hyperlipidemia.  Continue statin 7. Ischemic cardiomyopathy.  Ejection fraction of 15 to 20%.  Status post pacemaker/defibrillator.  No evidence of volume overload.  Continue to monitor.   DVT prophylaxis: Pradaxa Code Status: Full code Family Communication: Discussed with several family members at the bedside Disposition Plan: Possible discharge home in the next 24 hours.   Consultants:      Procedures:     Antimicrobials:   Rocephin 8/19 >  Flagyl 8/20 >   Subjective: Denies any shortness of breath or cough.  No chest pain.  Objective: Vitals:   02/25/18 2342 02/26/18 0007 02/26/18 0646 02/26/18 1514  BP:  94/62 129/76 116/79  Pulse:  77 76 77  Resp:  18 18 18   Temp: (!) 97.5 F (36.4 C) 98.2 F (36.8 C) 97.6 F (36.4 C) 98.7 F (37.1 C)  TempSrc: Axillary Oral Oral Oral  SpO2:  96% 100% 100%  Weight:  72.5 kg    Height:  5\' 9"  (1.753 m)      Intake/Output Summary (Last 24 hours) at 02/26/2018 1734 Last data filed at 02/26/2018 1556 Gross per 24 hour  Intake 3959.17 ml  Output -  Net 3959.17 ml   Filed Weights   02/25/18 1940 02/26/18 0007  Weight: 68 kg 72.5 kg    Examination:  General exam: Appears calm and comfortable  Respiratory system: bilateral rhonchi. Respiratory effort normal. Cardiovascular system: S1 & S2 heard, RRR. No JVD, murmurs, rubs, gallops or clicks. 1+ pedal edema. Gastrointestinal system: Abdomen is nondistended, soft and nontender. No organomegaly or masses felt. Normal bowel sounds heard. Central nervous system: Alert and oriented. No focal neurological deficits. Extremities: Symmetric 5 x 5 power. Skin: No rashes, lesions or ulcers Psychiatry: confused, pleasant     Data Reviewed: I have personally reviewed following labs and imaging studies  CBC: Recent Labs  Lab 02/25/18 2039 02/26/18 0545  WBC 10.0 6.1  NEUTROABS 8.5*  --   HGB 10.2* 9.2*  HCT 31.0* 28.5*  MCV  88.6 89.1  PLT 214 562   Basic Metabolic Panel: Recent Labs  Lab 02/25/18 2039 02/26/18 0545  NA 136 139  K 4.6 3.9  CL 103 107  CO2 24 25  GLUCOSE 108* 84  BUN 50* 42*  CREATININE 1.43* 1.28*  CALCIUM 8.4* 8.1*   GFR: Estimated Creatinine Clearance: 40.7 mL/min (A) (by C-G formula based on SCr of 1.28 mg/dL (H)). Liver Function Tests: Recent Labs  Lab 02/25/18 2039  AST 21  ALT 17  ALKPHOS 78  BILITOT 1.1  PROT 6.5   ALBUMIN 3.0*   Recent Labs  Lab 02/25/18 2039  LIPASE 26   No results for input(s): AMMONIA in the last 168 hours. Coagulation Profile: No results for input(s): INR, PROTIME in the last 168 hours. Cardiac Enzymes: No results for input(s): CKTOTAL, CKMB, CKMBINDEX, TROPONINI in the last 168 hours. BNP (last 3 results) No results for input(s): PROBNP in the last 8760 hours. HbA1C: No results for input(s): HGBA1C in the last 72 hours. CBG: Recent Labs  Lab 02/26/18 0018 02/26/18 0737 02/26/18 0803 02/26/18 1134 02/26/18 1654  GLUCAP 103* 67* 84 201* 99   Lipid Profile: No results for input(s): CHOL, HDL, LDLCALC, TRIG, CHOLHDL, LDLDIRECT in the last 72 hours. Thyroid Function Tests: No results for input(s): TSH, T4TOTAL, FREET4, T3FREE, THYROIDAB in the last 72 hours. Anemia Panel: No results for input(s): VITAMINB12, FOLATE, FERRITIN, TIBC, IRON, RETICCTPCT in the last 72 hours. Sepsis Labs: Recent Labs  Lab 02/25/18 2039 02/25/18 2107 02/25/18 2113 02/25/18 2304 02/26/18 0545  PROCALCITON  --   --  0.17  --   --   LATICACIDVEN 1.58 1.1  --  0.8 0.9    Recent Results (from the past 240 hour(s))  Blood culture (routine x 2)     Status: None (Preliminary result)   Collection Time: 02/25/18  9:07 PM  Result Value Ref Range Status   Specimen Description BLOOD  Final   Special Requests NONE  Final   Culture   Final    NO GROWTH < 12 HOURS Performed at [facility], [address], [place], [place] [address]    Report Status PENDING  Incomplete  Blood culture (routine x 2)     Status: None (Preliminary result)   Collection Time: 02/25/18  9:13 PM  Result Value Ref Range Status   Specimen Description BLOOD  Final   Special Requests NONE  Final   Culture   Final    NO GROWTH < 12 HOURS Performed at [facility], [address] [place], [place] [address]    Report Status PENDING  Incomplete         Radiology Studies: Dg Chest 2 View  Result Date:  02/25/2018 CLINICAL DATA:  Fever.  Possible aspiration. EXAM: CHEST - 2 VIEW COMPARISON:  Chest x-rays dated 12/17/2017 and 11/09/2017. FINDINGS: Stable cardiomegaly. Median sternotomy wires appear stable in alignment. Surgical changes of presumed CABG. Pacemaker leads appear stable in position. Vague opacity at the LEFT lung base, likely a combination of consolidation and small pleural effusion. IMPRESSION: 1. Vague opacity at the LEFT lung base, likely a combination of consolidation (pneumonia or aspiration) and small pleural effusion. 2. Stable cardiomegaly. Electronically Signed   By: Franki Cabot M.D.   On: 02/25/2018 21:01        Scheduled Meds: . atorvastatin  40 mg Oral Daily  . [START ON 02/27/2018] calcitRIOL  0.25 mcg Oral Q M,W,F  . dabigatran  75 mg Oral Daily  .  feeding supplement (ENSURE ENLIVE)  237 mL Oral BID BM  . insulin aspart  0-5 Units Subcutaneous QHS  . insulin aspart  0-9 Units Subcutaneous TID WC   Continuous Infusions: . cefTRIAXone (ROCEPHIN)  IV Stopped (02/26/18 1028)  . metronidazole Stopped (02/26/18 1556)     LOS: 1 day    Time spent: 84mins    Kathie Dike, MD [facility] Pager [phone]  If 7PM-7AM, please contact night-coverage [email or web address] Password [facility] 02/26/2018, 5:34 PM

## 2018-02-26 NOTE — Evaluation (Signed)
Clinical/Bedside Swallow Evaluation Patient Details  Name: Neil [name] MRN: [id number] Date of Birth: 1929/06/08  Today's Date: 02/26/2018 Time: SLP Start Time (ACUTE ONLY): 1515 SLP Stop Time (ACUTE ONLY): 1540 SLP Time Calculation (min) (ACUTE ONLY): 25 min  Past Medical History:  Past Medical History:  Diagnosis Date  . Atrial fibrillation ([place])   . Chronic kidney disease (CKD), stage III (moderate) (HCC)   . Complete heart block ([place])   . Coronary artery disease    status post CABG1999  . Dementia   . Diabetes mellitus   . Dyslipidemia   . Hypertension   . Osteoarthritis   . Pacemaker    St Jude status post generator replacement 2007  . Renal artery stenosis (HCC)    95%  left, 50% right   Past Surgical History:  Past Surgical History:  Procedure Laterality Date  . CORONARY ARTERY BYPASS GRAFT     with stenting  . PACEMAKER INSERTION     HPI:  84 year old male with a history of dementia, atrial fibrillation, ischemic cardiomyopathy, brought to the hospital with fever.  Found to have UTI probable aspiration pneumonia.  Started on intravenous antibiotics.  Slowly improving.   Assessment / Plan / Recommendation Clinical Impression  Pt shows no overt signs or symptoms of aspiration at bedside, however family reports a 3 week history of occasional coughing/choking episodes on water and solid foods. Pt now with PNA. He has dementia and needs mod cues to decrease rate of intake and coughs more when he drinks/eats rapidly. He has not had an objective assessment and family is interseted in this while he is in acute care. SLP will attempt to arrange MBSS for tomorrow, ok to continue diet as ordered. SLP will follow.  SLP Visit Diagnosis: Dysphagia, unspecified (R13.10)    Aspiration Risk  Mild aspiration risk    Diet Recommendation Dysphagia 3 (Mech soft);Thin liquid   Liquid Administration via: Cup;Straw Medication Administration: Whole meds with liquid Supervision:  Patient able to self feed;Full supervision/cueing for compensatory strategies Compensations: Minimize environmental distractions;Slow rate;Small sips/bites Postural Changes: Seated upright at 90 degrees;Remain upright for at least 30 minutes after po intake    Other  Recommendations Oral Care Recommendations: Oral care BID;Staff/trained caregiver to provide oral care Other Recommendations: Clarify dietary restrictions   Follow up Recommendations 24 hour supervision/assistance      Frequency and Duration min 2x/week  1 week       Prognosis Prognosis for Safe Diet Advancement: Fair Barriers to Reach Goals: Cognitive deficits      Swallow Study   General Date of Onset: 02/25/18 HPI: 84 year old male with a history of dementia, atrial fibrillation, ischemic cardiomyopathy, brought to the hospital with fever.  Found to have UTI probable aspiration pneumonia.  Started on intravenous antibiotics.  Slowly improving. Type of Study: Bedside Swallow Evaluation Diet Prior to this Study: Dysphagia 3 (soft);Thin liquids Temperature Spikes Noted: No Respiratory Status: Room air History of Recent Intubation: No Behavior/Cognition: Alert;Cooperative;Pleasant mood;Confused Oral Cavity Assessment: Within Functional Limits Oral Care Completed by SLP: No Oral Cavity - Dentition: Adequate natural dentition Vision: Functional for self-feeding Self-Feeding Abilities: Able to feed self Patient Positioning: Upright in bed Baseline Vocal Quality: Normal Volitional Cough: Strong Volitional Swallow: Able to elicit    Oral/Motor/Sensory Function Overall Oral Motor/Sensory Function: Within functional limits   Ice Chips Ice chips: Within functional limits Presentation: Spoon   Thin Liquid Thin Liquid: Within functional limits Presentation: Cup;Self Fed;Straw    Nectar Thick Nectar Thick Liquid:  Not tested   Honey Thick Honey Thick Liquid: Not tested   Puree Puree: Within functional  limits Presentation: Spoon   Solid     Solid: Within functional limits Presentation: Spoon     Thank you,  Genene Churn, [place]  PORTER,DABNEY 02/26/2018,5:58 PM

## 2018-02-26 NOTE — Progress Notes (Signed)
Patient had a blood sugar of 67 this am, given juice and blood sugar increased to 84.  MD made aware

## 2018-02-27 ENCOUNTER — Inpatient Hospital Stay (HOSPITAL_COMMUNITY): Payer: Medicare Other

## 2018-02-27 DIAGNOSIS — J690 Pneumonitis due to inhalation of food and vomit: Principal | ICD-10-CM

## 2018-02-27 DIAGNOSIS — N179 Acute kidney failure, unspecified: Secondary | ICD-10-CM

## 2018-02-27 DIAGNOSIS — E1169 Type 2 diabetes mellitus with other specified complication: Secondary | ICD-10-CM

## 2018-02-27 DIAGNOSIS — E43 Unspecified severe protein-calorie malnutrition: Secondary | ICD-10-CM

## 2018-02-27 DIAGNOSIS — N390 Urinary tract infection, site not specified: Secondary | ICD-10-CM

## 2018-02-27 DIAGNOSIS — F0390 Unspecified dementia without behavioral disturbance: Secondary | ICD-10-CM

## 2018-02-27 DIAGNOSIS — E785 Hyperlipidemia, unspecified: Secondary | ICD-10-CM

## 2018-02-27 DIAGNOSIS — I482 Chronic atrial fibrillation: Secondary | ICD-10-CM

## 2018-02-27 DIAGNOSIS — I255 Ischemic cardiomyopathy: Secondary | ICD-10-CM

## 2018-02-27 DIAGNOSIS — I10 Essential (primary) hypertension: Secondary | ICD-10-CM

## 2018-02-27 LAB — GLUCOSE, CAPILLARY
GLUCOSE-CAPILLARY: 210 mg/dL — AB (ref 70–99)
Glucose-Capillary: 146 mg/dL — ABNORMAL HIGH (ref 70–99)

## 2018-02-27 LAB — URINE CULTURE

## 2018-02-27 MED ORDER — CEPHALEXIN 500 MG PO CAPS: 500.0000 mg | ORAL_CAPSULE | Freq: Four times a day (QID) | ORAL | 0 refills | Status: DC

## 2018-02-27 MED ORDER — ALBUTEROL SULFATE (2.5 MG/3ML) 0.083% IN NEBU: 2.5000 mg | INHALATION_SOLUTION | Freq: Four times a day (QID) | RESPIRATORY_TRACT | 12 refills | Status: AC | PRN

## 2018-02-27 MED ORDER — RESOURCE THICKENUP CLEAR PO POWD
ORAL | Status: DC | PRN
  Filled 2018-02-27: qty 125

## 2018-02-27 MED ORDER — FUROSEMIDE 40 MG PO TABS: 40.0000 mg | ORAL_TABLET | Freq: Every day | ORAL | 11 refills | Status: DC

## 2018-02-27 MED ORDER — RESOURCE THICKENUP CLEAR PO POWD: ORAL | 1 refills | Status: AC

## 2018-02-27 MED ORDER — PANTOPRAZOLE SODIUM 40 MG PO TBEC: 40.0000 mg | DELAYED_RELEASE_TABLET | Freq: Every day | ORAL | 0 refills | Status: DC

## 2018-02-27 NOTE — Progress Notes (Signed)
Removed IV-clean, dry, intact. Reveiwed d/c paperwork with patient, wife, and daughter, and granddaughter. Reviewed new/medications changes. Answered all questions. Wheeled patient and wife and belongings to main entrance where they were picked up by daughter.

## 2018-02-27 NOTE — Evaluation (Addendum)
Physical Therapy Evaluation Patient Details Name: Neil [name] MRN: [id number] DOB: Jun 10, 1929 Today's Date: 02/27/2018   History of Present Illness   Neil [name] is a 84 y.o. male with medical history significant for dementia, failure to thrive, permanent atrial fibrillation with prior pacemaker placement on Pradaxa, type 2 diabetes, CAD, dyslipidemia, hypertension, and protein calorie malnutrition who was brought to the ED upon developing a fever of 100.2 Fahrenheit as well as some rigors and chills shortly before dinnertime.  His caretaker at the bedside states that patient has had trouble with aspiration previously and has had some choking and trouble swallowing his medications in the last 2 to 3 days.  Other than the fever, no significant dyspnea, cough, chest pain, edema, or any other symptoms have been noted.    Clinical Impression  Patient functioning at baseline for functional mobility and transfers.  Patient non-ambulatory at home, limited to a few steps for transferring to wheelchair and patient's daughter demonstrates good return for transferring patient to chair.  Plan:  Patient discharged from physical therapy to care of nursing for out of bed to chair daily as tolerated for length of stay.    Follow Up Recommendations Supervision/Assistance - 24 hour;Supervision for mobility/OOB;Home health PT    Equipment Recommendations  None recommended by PT    Recommendations for Other Services       Precautions / Restrictions Precautions Precautions: Fall Restrictions Weight Bearing Restrictions: No      Mobility  Bed Mobility Overal bed mobility: Needs Assistance Bed Mobility: Supine to Sit     Supine to sit: Min assist     General bed mobility comments: labored movement with frequent verbal/tactile cueing  Transfers Overall transfer level: Needs assistance Equipment used: 1 person hand held assist Transfers: Sit to/from Stand Sit to Stand: Min assist Stand  pivot transfers: Min assist       General transfer comment: hand held assist with both hands  Ambulation/Gait Ambulation/Gait assistance: Min assist Gait Distance (Feet): 4 Feet Assistive device: 1 person hand held assist Gait Pattern/deviations: Decreased step length - right;Decreased step length - left;Decreased stride length Gait velocity: slow   General Gait Details: limited to 4-5 unsteady steps with bilateral hand held assist at bedside during transfer to chair  Stairs            Wheelchair Mobility    Modified Rankin (Stroke Patients Only)       Balance Overall balance assessment: Needs assistance Sitting-balance support: Feet supported;No upper extremity supported Sitting balance-Leahy Scale: Good     Standing balance support: Bilateral upper extremity supported;During functional activity Standing balance-Leahy Scale: Fair                               Pertinent Vitals/Pain Pain Assessment: No/denies pain    Home Living Family/patient expects to be discharged to:: Private residence Living Arrangements: Spouse/significant other;Children Available Help at Discharge: Family Type of Home: House Home Access: Ramped entrance     Home Layout: One level Home Equipment: Wheelchair - [employer] - 2 wheels;Hospital bed      Prior Function Level of Independence: Needs assistance   Gait / Transfers Assistance Needed: short distanced hand held assist for transfers for bed<>w/c transfers with family assisting, uses w/c for mobility  ADL's / Homemaking Assistance Needed: assisted by family, patient used diapers, bed baths        Hand Dominance  Extremity/Trunk Assessment   Upper Extremity Assessment Upper Extremity Assessment: Overall WFL for tasks assessed    Lower Extremity Assessment Lower Extremity Assessment: Overall WFL for tasks assessed    Cervical / Trunk Assessment Cervical / Trunk Assessment: Normal  Communication    Communication: No difficulties  Cognition Arousal/Alertness: Awake/alert Behavior During Therapy: WFL for tasks assessed/performed Overall Cognitive Status: History of cognitive impairments - at baseline                                        General Comments      Exercises     Assessment/Plan    PT Assessment All further PT needs can be met in the next venue of care  PT Problem List Decreased strength;Decreased mobility;Decreased balance;Decreased activity tolerance       PT Treatment Interventions      PT Goals (Current goals can be found in the Care Plan section)  Acute Rehab PT Goals Patient Stated Goal: return home with family to assist PT Goal Formulation: With patient/family Time For Goal Achievement: 02/27/18 Potential to Achieve Goals: Good    Frequency     Barriers to discharge        Co-evaluation               AM-PAC PT "6 Clicks" Daily Activity  Outcome Measure Difficulty turning over in bed (including adjusting bedclothes, sheets and blankets)?: A Little Difficulty moving from lying on back to sitting on the side of the bed? : A Little Difficulty sitting down on and standing up from a chair with arms (e.g., wheelchair, bedside commode, etc,.)?: A Little Help needed moving to and from a bed to chair (including a wheelchair)?: A Little Help needed walking in hospital room?: A Lot Help needed climbing 3-5 steps with a railing? : Total 6 Click Score: 15    End of Session   Activity Tolerance: Patient tolerated treatment well Patient left: in chair;with call bell/phone within reach;with family/visitor present Nurse Communication: Mobility status PT Visit Diagnosis: Unsteadiness on feet (R26.81);Other abnormalities of gait and mobility (R26.89);Muscle weakness (generalized) (M62.81)    Time: 3958-4417 PT Time Calculation (min) (ACUTE ONLY): 15 min   Charges:   PT Evaluation $PT Eval Low Complexity: 1 Low PT  Treatments $Therapeutic Activity: 8-22 mins        2:59 PM, 02/27/18 Lonell Grandchild, MPT Physical Therapist with [facility] 336 [phone] office [phone] mobile phone

## 2018-02-27 NOTE — Discharge Summary (Signed)
Physician Discharge Summary  Neil [name] INO:[id number] DOB: April 08, 1929 DOA: 02/25/2018  PCP: Redmond School, MD  Admit date: 02/25/2018 Discharge date: 02/27/2018  Admitted From: Home Disposition: Home  Recommendations for Outpatient Follow-up:  1. Follow up with PCP in 1-2 weeks 2. Please obtain BMP/CBC in one week 3. Repeat chest x-ray in 3 to 4 weeks to ensure resolution of pneumonia  Home Health: Home health RN, PT, speech therapy Equipment/Devices:  Discharge Condition: Stable CODE STATUS: Full code Diet recommendation: Heart healthy diet, nectar thick liquids  Brief/Interim Summary: 84 year old male with a history of dementia, atrial fibrillation, ischemic cardiomyopathy, brought to the hospital with fever.  Found to have UTI probable aspiration pneumonia.  Started on intravenous antibiotics.    Discharge Diagnoses:  Principal Problem:   Aspiration pneumonia ([place]) Active Problems:   Type 2 diabetes mellitus with hyperlipidemia (HCC)   Cardiomyopathy, ischemic   Permanent atrial fibrillation (HCC)   HYPERTENSION, BENIGN   FTT (failure to thrive) in adult   AKI (acute kidney injury) ([place])   Dementia   Protein-calorie malnutrition, severe   UTI (urinary tract infection)  1. Urinary tract infection.  Treated with Rocephin.  Final urine culture results showed multiple species. 2. Pneumonia, presumed aspiration.  Family reports frequent episodes of reflux. He was started on PPI.  Seen by speech therapy who recommended nectar thick liquids.  Patient is now afebrile.  WBC count has improved.  He is been transitioned to oral antibiotics. 3. Acute kidney injury.  Likely prerenal.  Some improvement with hydration.    Continue to hold losartan until follow-up with primary care physician to ensure renal function is stable 4. Permanent atrial fibrillation.  Anticoagulated with Pradaxa.    Heart rate stable on Coreg 5. Diabetes.  Blood sugars remained stable.  Resume glipizide on  discharge. 6. Hyperlipidemia.  Continue statin 7. Ischemic cardiomyopathy.  Ejection fraction of 15 to 20%.  Status post pacemaker/defibrillator.    He is developing 1+ edema lower extremity.  We will start the patient on oral Lasix.  He will need repeat chemistries in 1 week to ensure stability of renal function.  Continue on beta-blockers.  Discharge Instructions  Discharge Instructions    Diet - low sodium heart healthy   Complete by:  As directed    Increase activity slowly   Complete by:  As directed      Allergies as of 02/27/2018      Reactions   Penicillins Swelling, Rash   Itching Has patient had a PCN reaction causing immediate rash, facial/tongue/throat swelling, SOB or lightheadedness with hypotension: Yes Has patient had a PCN reaction causing severe rash involving mucus membranes or skin necrosis: Yes Has patient had a PCN reaction that required hospitalization: No Has patient had a PCN reaction occurring within the last 10 years: No If all of the above answers are "NO", then may proceed with Cephalosporin use.      Medication List    STOP taking these medications   losartan 50 MG tablet Commonly known as:  COZAAR   megestrol 625 MG/5ML suspension Commonly known as:  MEGACE ES     TAKE these medications   albuterol (2.5 MG/3ML) 0.083% nebulizer solution Commonly known as:  PROVENTIL Take 3 mLs (2.5 mg total) by nebulization every 6 (six) hours as needed for wheezing or shortness of breath.   atorvastatin 40 MG tablet Commonly known as:  LIPITOR Take 40 mg by mouth daily.   calcitRIOL 0.25 MCG capsule Commonly known as:  ROCALTROL Take 0.25 mcg by mouth every Monday, Wednesday, and Friday.   carvedilol 6.25 MG tablet Commonly known as:  COREG Take 6.25 mg by mouth every morning.   cephALEXin 500 MG capsule Commonly known as:  KEFLEX Take 1 capsule (500 mg total) by mouth 4 (four) times daily for 10 days.   dabigatran 75 MG Caps capsule Commonly  known as:  PRADAXA Take 1 capsule (75 mg total) by mouth every 12 (twelve) hours. What changed:  when to take this   feeding supplement (ENSURE ENLIVE) Liqd Take 237 mLs by mouth 2 (two) times daily between meals.   furosemide 40 MG tablet Commonly known as:  LASIX Take 1 tablet (40 mg total) by mouth daily.   glipiZIDE 5 MG 24 hr tablet Commonly known as:  GLUCOTROL XL Take 5 mg by mouth daily.   pantoprazole 40 MG tablet Commonly known as:  PROTONIX Take 1 tablet (40 mg total) by mouth daily. Start taking on:  02/28/2018   RESOURCE THICKENUP CLEAR Powd Thicken fluids to nectar thick       Allergies  Allergen Reactions  . Penicillins Swelling and Rash    Itching Has patient had a PCN reaction causing immediate rash, facial/tongue/throat swelling, SOB or lightheadedness with hypotension: Yes Has patient had a PCN reaction causing severe rash involving mucus membranes or skin necrosis: Yes Has patient had a PCN reaction that required hospitalization: No Has patient had a PCN reaction occurring within the last 10 years: No If all of the above answers are "NO", then may proceed with Cephalosporin use.    Consultations:     Procedures/Studies: Dg Chest 2 View  Result Date: 02/25/2018 CLINICAL DATA:  Fever.  Possible aspiration. EXAM: CHEST - 2 VIEW COMPARISON:  Chest x-rays dated 12/17/2017 and 11/09/2017. FINDINGS: Stable cardiomegaly. Median sternotomy wires appear stable in alignment. Surgical changes of presumed CABG. Pacemaker leads appear stable in position. Vague opacity at the LEFT lung base, likely a combination of consolidation and small pleural effusion. IMPRESSION: 1. Vague opacity at the LEFT lung base, likely a combination of consolidation (pneumonia or aspiration) and small pleural effusion. 2. Stable cardiomegaly. Electronically Signed   By: Franki Cabot M.D.   On: 02/25/2018 21:01       Subjective: Denies any shortness of breath or chest  pain.  Discharge Exam: Vitals:   02/26/18 2341 02/27/18 0625  BP:  121/79  Pulse:  79  Resp:  18  Temp: 98.4 F (36.9 C) 97.9 F (36.6 C)  SpO2:  95%   Vitals:   02/26/18 1514 02/26/18 2113 02/26/18 2341 02/27/18 0625  BP: 116/79 109/73  121/79  Pulse: 77 77  79  Resp: 18 18  18   Temp: 98.7 F (37.1 C) 100 F (37.8 C) 98.4 F (36.9 C) 97.9 F (36.6 C)  TempSrc: Oral Oral Oral Oral  SpO2: 100% 95%  95%  Weight:      Height:        General: Pt is alert, awake, not in acute distress Cardiovascular: RRR, S1/S2 +, no rubs, no gallops Respiratory: Bilateral rhonchi Abdominal: Soft, NT, ND, bowel sounds + Extremities: 1+ edema, no cyanosis    The results of significant diagnostics from this hospitalization (including imaging, microbiology, ancillary and laboratory) are listed below for reference.     Microbiology: Recent Results (from the past 240 hour(s))  Urine culture     Status: Abnormal   Collection Time: 02/25/18  8:11 PM  Result Value Ref Range Status  Specimen Description   Final    URINE, RANDOM Performed at [facility], [address] [place], [place] [address]    Special Requests   Final    NONE Performed at [facility], [address], [place], [place] [address]    Culture MULTIPLE SPECIES PRESENT, SUGGEST RECOLLECTION (A)  Final   Report Status 02/27/2018 FINAL  Final  Blood culture (routine x 2)     Status: None (Preliminary result)   Collection Time: 02/25/18  9:07 PM  Result Value Ref Range Status   Specimen Description BLOOD LEFT ARM  Final   Special Requests   Final    BOTTLES DRAWN AEROBIC AND ANAEROBIC Blood Culture results may not be optimal due to an excessive volume of blood received in culture bottles   Culture   Final    NO GROWTH 2 DAYS Performed at [facility], [address], [place], [place] [address]    Report Status PENDING  Incomplete  Blood culture (routine x 2)     Status: None (Preliminary result)   Collection Time:  02/25/18  9:13 PM  Result Value Ref Range Status   Specimen Description BLOOD LEFT HAND  Final   Special Requests   Final    BOTTLES DRAWN AEROBIC AND ANAEROBIC Blood Culture adequate volume   Culture   Final    NO GROWTH 2 DAYS Performed at [facility], [address], [place], [place] [address]    Report Status PENDING  Incomplete     Labs: BNP (last 3 results) No results for input(s): BNP in the last 8760 hours. Basic Metabolic Panel: Recent Labs  Lab 02/25/18 2039 02/26/18 0545  NA 136 139  K 4.6 3.9  CL 103 107  CO2 24 25  GLUCOSE 108* 84  BUN 50* 42*  CREATININE 1.43* 1.28*  CALCIUM 8.4* 8.1*   Liver Function Tests: Recent Labs  Lab 02/25/18 2039  AST 21  ALT 17  ALKPHOS 78  BILITOT 1.1  PROT 6.5  ALBUMIN 3.0*   Recent Labs  Lab 02/25/18 2039  LIPASE 26   No results for input(s): AMMONIA in the last 168 hours. CBC: Recent Labs  Lab 02/25/18 2039 02/26/18 0545  WBC 10.0 6.1  NEUTROABS 8.5*  --   HGB 10.2* 9.2*  HCT 31.0* 28.5*  MCV 88.6 89.1  PLT 214 181   Cardiac Enzymes: No results for input(s): CKTOTAL, CKMB, CKMBINDEX, TROPONINI in the last 168 hours. BNP: Invalid input(s): POCBNP CBG: Recent Labs  Lab 02/26/18 1134 02/26/18 1654 02/26/18 2118 02/27/18 0823 02/27/18 1154  GLUCAP 201* 99 220* 210* 146*   D-Dimer No results for input(s): DDIMER in the last 72 hours. Hgb A1c No results for input(s): HGBA1C in the last 72 hours. Lipid Profile No results for input(s): CHOL, HDL, LDLCALC, TRIG, CHOLHDL, LDLDIRECT in the last 72 hours. Thyroid function studies No results for input(s): TSH, T4TOTAL, T3FREE, THYROIDAB in the last 72 hours.  Invalid input(s): FREET3 Anemia work up No results for input(s): VITAMINB12, FOLATE, FERRITIN, TIBC, IRON, RETICCTPCT in the last 72 hours. Urinalysis    Component Value Date/Time   COLORURINE YELLOW 02/25/2018 2011   APPEARANCEUR CLEAR 02/25/2018 2011   LABSPEC 1.009 02/25/2018 2011    PHURINE 5.0 02/25/2018 2011   GLUCOSEU NEGATIVE 02/25/2018 2011   HGBUR SMALL (A) 02/25/2018 2011   BILIRUBINUR NEGATIVE 02/25/2018 2011   [place] 02/25/2018 2011   PROTEINUR NEGATIVE 02/25/2018 2011   NITRITE POSITIVE (A) 02/25/2018 2011   LEUKOCYTESUR  NEGATIVE 02/25/2018 2011   Sepsis Labs Invalid input(s): PROCALCITONIN,  WBC,  LACTICIDVEN Microbiology Recent Results (from the past 240 hour(s))  Urine culture     Status: Abnormal   Collection Time: 02/25/18  8:11 PM  Result Value Ref Range Status   Specimen Description   Final    URINE, RANDOM Performed at [facility], [address], [place], [place] [address]    Special Requests   Final    NONE Performed at [facility], [address] [place], [place] [address]    Culture MULTIPLE SPECIES PRESENT, SUGGEST RECOLLECTION (A)  Final   Report Status 02/27/2018 FINAL  Final  Blood culture (routine x 2)     Status: None (Preliminary result)   Collection Time: 02/25/18  9:07 PM  Result Value Ref Range Status   Specimen Description BLOOD LEFT ARM  Final   Special Requests   Final    BOTTLES DRAWN AEROBIC AND ANAEROBIC Blood Culture results may not be optimal due to an excessive volume of blood received in culture bottles   Culture   Final    NO GROWTH 2 DAYS Performed at [facility], [address], [place], [place] [address]    Report Status PENDING  Incomplete  Blood culture (routine x 2)     Status: None (Preliminary result)   Collection Time: 02/25/18  9:13 PM  Result Value Ref Range Status   Specimen Description BLOOD LEFT HAND  Final   Special Requests   Final    BOTTLES DRAWN AEROBIC AND ANAEROBIC Blood Culture adequate volume   Culture   Final    NO GROWTH 2 DAYS Performed at [facility], [address] [place], [place] [address]    Report Status PENDING  Incomplete     Time coordinating discharge: 37mins  SIGNED:   Kathie Dike, MD  [facility] 02/27/2018, 7:19 PM Pager   If  7PM-7AM, please contact night-coverage [email or web address] Password [email or web address]

## 2018-02-27 NOTE — Care Management Note (Signed)
Case Management Note  Patient Details  Name: Neil [name] MRN: [id number] Date of Birth: [birth year]  Subjective/Objective:  Admitted with aspiration pneumonia. Pt from home, lives with wife, has strong family support. Active with [facility]. Pt has had [facility] services in the past. PT recommends no PT follow up. Pt's family interested in [facility] PT - pt is not yet at baseline and they are continuing to make progress.                   Action/Plan: DC home with [facility] services. Family has requested [facility] as they have used them in the past. Aware HH has 48 hrs to make first visit. Vaughan Basta, [facility] rep, given referral.   Expected Discharge Date:       02/27/2018           Expected Discharge Plan:  [place]  In-House Referral:  NA  Discharge planning Services  CM Consult  Post Acute Care Choice:  Home Health Choice offered to:  Spouse  HH Arranged:  RN, PT [facility] Agency:  [place]  Status of Service:  Completed, signed off  If discussed at [place] of Stay Meetings, dates discussed:    Additional Comments:  Sherald Barge, RN 02/27/2018, 1:12 PM

## 2018-02-27 NOTE — Progress Notes (Signed)
Modified Barium Swallow Progress Note  Patient Details  Name: Neil [name] MRN: [id number] Date of Birth: Mar 08, 1929  Today's Date: 02/27/2018  Modified Barium Swallow completed.  Full report located under Chart Review in the Imaging Section.  Brief recommendations include the following:  Clinical Impression   Pt presents with mild oral phase and mild/mod pharyngeal phase dysphagia characterized by reduced oral control with liquids resulting in premature spillage to the pyriforms with nectars and thins, min delay in swallow initiation, reduced epiglottic deflection with liquids, variable penetration of thins with a single episode of aspiration (below cords and back into laryngeal vestibule/not completely removed) during sequential cup sips of thin. Pt with flash penetration when taking thins via straw and resulting vallecular and pyriform residue post swallow from oral residuals spilling into pharynx with reduced sensation of same.  Given current PNA and family report of several significant "coughing" and "choking" episodes when drinking liquids (typically when taking large and sequential sips) at home prior to recent hospitalization, recommend thickening liquids to nectar for consumption during meals and allowance for [employer] Protocol (ok for thin water only between meals and after oral care for hydration and comfort); of for regular textures with family to chop foods as needed. Results and recommendations were reviewed with Pt's family and they were provided written information with summary of recommendations and are in agreement with plan of care. Family reports that Pt drinks V8 Fusion at home which is close to nectar-thick per family, but they had been watering it down. Pt will likely d/c home later today. They were provided my contact information should they have further questions post discharge.      Swallow Evaluation Recommendations       SLP Diet Recommendations: Regular  solids;Nectar thick liquid   Liquid Administration via: Cup   Medication Administration: Whole meds with liquid   Supervision: Patient able to self feed;Full supervision/cueing for compensatory strategies   Compensations: Minimize environmental distractions;Slow rate;Small sips/bites   Postural Changes: Remain semi-upright after after feeds/meals (Comment);Seated upright at 90 degrees   Oral Care Recommendations: Oral care BID;Staff/trained caregiver to provide oral care   Other Recommendations: Clarify dietary restrictions;Order thickener from pharmacy;Prohibited food (jello, ice cream, thin soups)   Thank you,  Neil [name], Neil [name] 02/27/2018,12:38 PM

## 2018-02-28 DIAGNOSIS — I255 Ischemic cardiomyopathy: Secondary | ICD-10-CM | POA: Diagnosis not present

## 2018-02-28 DIAGNOSIS — M1991 Primary osteoarthritis, unspecified site: Secondary | ICD-10-CM | POA: Diagnosis not present

## 2018-02-28 DIAGNOSIS — F0390 Unspecified dementia without behavioral disturbance: Secondary | ICD-10-CM | POA: Diagnosis not present

## 2018-02-28 DIAGNOSIS — J690 Pneumonitis due to inhalation of food and vomit: Secondary | ICD-10-CM | POA: Diagnosis not present

## 2018-02-28 DIAGNOSIS — Z466 Encounter for fitting and adjustment of urinary device: Secondary | ICD-10-CM | POA: Diagnosis not present

## 2018-02-28 DIAGNOSIS — E785 Hyperlipidemia, unspecified: Secondary | ICD-10-CM | POA: Diagnosis not present

## 2018-02-28 DIAGNOSIS — E1169 Type 2 diabetes mellitus with other specified complication: Secondary | ICD-10-CM | POA: Diagnosis not present

## 2018-02-28 DIAGNOSIS — D539 Nutritional anemia, unspecified: Secondary | ICD-10-CM | POA: Diagnosis not present

## 2018-02-28 DIAGNOSIS — I129 Hypertensive chronic kidney disease with stage 1 through stage 4 chronic kidney disease, or unspecified chronic kidney disease: Secondary | ICD-10-CM | POA: Diagnosis not present

## 2018-02-28 DIAGNOSIS — N183 Chronic kidney disease, stage 3 (moderate): Secondary | ICD-10-CM | POA: Diagnosis not present

## 2018-02-28 DIAGNOSIS — E1122 Type 2 diabetes mellitus with diabetic chronic kidney disease: Secondary | ICD-10-CM | POA: Diagnosis not present

## 2018-02-28 DIAGNOSIS — I442 Atrioventricular block, complete: Secondary | ICD-10-CM | POA: Diagnosis not present

## 2018-02-28 DIAGNOSIS — Z951 Presence of aortocoronary bypass graft: Secondary | ICD-10-CM | POA: Diagnosis not present

## 2018-02-28 DIAGNOSIS — I482 Chronic atrial fibrillation: Secondary | ICD-10-CM | POA: Diagnosis not present

## 2018-02-28 DIAGNOSIS — E43 Unspecified severe protein-calorie malnutrition: Secondary | ICD-10-CM | POA: Diagnosis not present

## 2018-02-28 DIAGNOSIS — N390 Urinary tract infection, site not specified: Secondary | ICD-10-CM | POA: Diagnosis not present

## 2018-02-28 DIAGNOSIS — I701 Atherosclerosis of renal artery: Secondary | ICD-10-CM | POA: Diagnosis not present

## 2018-02-28 DIAGNOSIS — Z950 Presence of cardiac pacemaker: Secondary | ICD-10-CM | POA: Diagnosis not present

## 2018-02-28 DIAGNOSIS — R627 Adult failure to thrive: Secondary | ICD-10-CM | POA: Diagnosis not present

## 2018-02-28 LAB — BLOOD CULTURE ID PANEL (REFLEXED)
Acinetobacter baumannii: NOT DETECTED
CANDIDA GLABRATA: NOT DETECTED
CANDIDA KRUSEI: NOT DETECTED
CANDIDA PARAPSILOSIS: NOT DETECTED
CARBAPENEM RESISTANCE: NOT DETECTED
Candida albicans: NOT DETECTED
Candida tropicalis: NOT DETECTED
ESCHERICHIA COLI: DETECTED — AB
Enterobacter cloacae complex: NOT DETECTED
Enterobacteriaceae species: DETECTED — AB
Enterococcus species: NOT DETECTED
Haemophilus influenzae: NOT DETECTED
KLEBSIELLA PNEUMONIAE: NOT DETECTED
Klebsiella oxytoca: NOT DETECTED
LISTERIA MONOCYTOGENES: NOT DETECTED
Neisseria meningitidis: NOT DETECTED
PSEUDOMONAS AERUGINOSA: NOT DETECTED
Proteus species: NOT DETECTED
SERRATIA MARCESCENS: NOT DETECTED
STAPHYLOCOCCUS AUREUS BCID: NOT DETECTED
STAPHYLOCOCCUS SPECIES: NOT DETECTED
STREPTOCOCCUS PNEUMONIAE: NOT DETECTED
STREPTOCOCCUS PYOGENES: NOT DETECTED
Streptococcus agalactiae: NOT DETECTED
Streptococcus species: NOT DETECTED

## 2018-03-01 ENCOUNTER — Other Ambulatory Visit (HOSPITAL_COMMUNITY): Payer: Self-pay | Admitting: Internal Medicine

## 2018-03-01 ENCOUNTER — Other Ambulatory Visit: Payer: Self-pay | Admitting: *Deleted

## 2018-03-01 ENCOUNTER — Ambulatory Visit (HOSPITAL_COMMUNITY)
Admission: RE | Admit: 2018-03-01 | Discharge: 2018-03-01 | Disposition: A | Discharge status: Home / Self Care | Payer: Medicare Other | Source: Ambulatory Visit | Attending: Internal Medicine | Admitting: Internal Medicine

## 2018-03-01 DIAGNOSIS — R05 Cough: Secondary | ICD-10-CM

## 2018-03-01 DIAGNOSIS — J90 Pleural effusion, not elsewhere classified: Secondary | ICD-10-CM | POA: Insufficient documentation

## 2018-03-01 DIAGNOSIS — R627 Adult failure to thrive: Secondary | ICD-10-CM | POA: Diagnosis not present

## 2018-03-01 DIAGNOSIS — R443 Hallucinations, unspecified: Secondary | ICD-10-CM | POA: Diagnosis not present

## 2018-03-01 DIAGNOSIS — R918 Other nonspecific abnormal finding of lung field: Secondary | ICD-10-CM | POA: Insufficient documentation

## 2018-03-01 DIAGNOSIS — Z1389 Encounter for screening for other disorder: Secondary | ICD-10-CM | POA: Diagnosis not present

## 2018-03-01 DIAGNOSIS — N183 Chronic kidney disease, stage 3 (moderate): Secondary | ICD-10-CM | POA: Diagnosis not present

## 2018-03-01 DIAGNOSIS — E119 Type 2 diabetes mellitus without complications: Secondary | ICD-10-CM | POA: Diagnosis not present

## 2018-03-01 DIAGNOSIS — R059 Cough, unspecified: Secondary | ICD-10-CM

## 2018-03-01 DIAGNOSIS — G2581 Restless legs syndrome: Secondary | ICD-10-CM | POA: Diagnosis not present

## 2018-03-01 DIAGNOSIS — J690 Pneumonitis due to inhalation of food and vomit: Secondary | ICD-10-CM | POA: Diagnosis not present

## 2018-03-01 DIAGNOSIS — Z681 Body mass index (BMI) 19 or less, adult: Secondary | ICD-10-CM | POA: Diagnosis not present

## 2018-03-01 DIAGNOSIS — I5032 Chronic diastolic (congestive) heart failure: Secondary | ICD-10-CM | POA: Diagnosis not present

## 2018-03-01 DIAGNOSIS — T50905A Adverse effect of unspecified drugs, medicaments and biological substances, initial encounter: Secondary | ICD-10-CM | POA: Diagnosis not present

## 2018-03-01 NOTE — Patient Outreach (Signed)
[facility]) Care Management  03/01/2018  Neil [name] 08/26/1928 [id number]   CSW called & spoke with patient's wife, [name] regarding caregivers. [name] informed CSW that they have made arrangements with [employer] to come 3 hours a day, 7 days a week but that they would have to pay out of pocket until they hit their maximum and then they will increase to 4 hours a day. CSW spoke with patient's family-friend, [name] who informed CSW that they are now looking to get [name] caregivers covered through their long term care insurance as well. Patient had a doctors appointment this morning at his PCP - Dr. Gerarda Fraction and they were able to transport him via car but they are still looking for a wheelchair accessible Lucianne Lei to purchase. [name] states they also have the information for [employer] should they ever need it. [name] states that at this time, no further CSW needs have been identified, but CSW encouraged them to call if anything further came up - CSW will sign off.    Raynaldo Opitz, LCSW [facility]  Clinical Social Worker cell #: [phone]

## 2018-03-02 LAB — CULTURE, BLOOD (ROUTINE X 2)
CULTURE: NO GROWTH
Special Requests: ADEQUATE

## 2018-03-03 ENCOUNTER — Emergency Department (HOSPITAL_COMMUNITY): Payer: Medicare Other

## 2018-03-03 ENCOUNTER — Encounter (HOSPITAL_COMMUNITY): Payer: Self-pay | Admitting: Emergency Medicine

## 2018-03-03 ENCOUNTER — Emergency Department (HOSPITAL_COMMUNITY)
Admission: EM | Admit: 2018-03-03 | Discharge: 2018-03-03 | Disposition: A | Discharge status: Home / Self Care | Payer: Medicare Other | Attending: Emergency Medicine | Admitting: Emergency Medicine

## 2018-03-03 ENCOUNTER — Other Ambulatory Visit: Payer: Self-pay

## 2018-03-03 DIAGNOSIS — I2510 Atherosclerotic heart disease of native coronary artery without angina pectoris: Secondary | ICD-10-CM | POA: Diagnosis not present

## 2018-03-03 DIAGNOSIS — J690 Pneumonitis due to inhalation of food and vomit: Secondary | ICD-10-CM | POA: Diagnosis not present

## 2018-03-03 DIAGNOSIS — R1084 Generalized abdominal pain: Secondary | ICD-10-CM | POA: Diagnosis present

## 2018-03-03 DIAGNOSIS — Z7902 Long term (current) use of antithrombotics/antiplatelets: Secondary | ICD-10-CM | POA: Diagnosis not present

## 2018-03-03 DIAGNOSIS — I129 Hypertensive chronic kidney disease with stage 1 through stage 4 chronic kidney disease, or unspecified chronic kidney disease: Secondary | ICD-10-CM | POA: Diagnosis not present

## 2018-03-03 DIAGNOSIS — K5900 Constipation, unspecified: Secondary | ICD-10-CM

## 2018-03-03 DIAGNOSIS — E1122 Type 2 diabetes mellitus with diabetic chronic kidney disease: Secondary | ICD-10-CM | POA: Insufficient documentation

## 2018-03-03 DIAGNOSIS — R339 Retention of urine, unspecified: Secondary | ICD-10-CM | POA: Diagnosis not present

## 2018-03-03 DIAGNOSIS — I255 Ischemic cardiomyopathy: Secondary | ICD-10-CM | POA: Diagnosis not present

## 2018-03-03 DIAGNOSIS — K5730 Diverticulosis of large intestine without perforation or abscess without bleeding: Secondary | ICD-10-CM | POA: Diagnosis not present

## 2018-03-03 DIAGNOSIS — F0390 Unspecified dementia without behavioral disturbance: Secondary | ICD-10-CM | POA: Diagnosis not present

## 2018-03-03 DIAGNOSIS — Z79899 Other long term (current) drug therapy: Secondary | ICD-10-CM | POA: Diagnosis not present

## 2018-03-03 DIAGNOSIS — Z7984 Long term (current) use of oral hypoglycemic drugs: Secondary | ICD-10-CM | POA: Diagnosis not present

## 2018-03-03 DIAGNOSIS — Z950 Presence of cardiac pacemaker: Secondary | ICD-10-CM | POA: Diagnosis not present

## 2018-03-03 DIAGNOSIS — N183 Chronic kidney disease, stage 3 (moderate): Secondary | ICD-10-CM | POA: Diagnosis not present

## 2018-03-03 DIAGNOSIS — N390 Urinary tract infection, site not specified: Secondary | ICD-10-CM | POA: Diagnosis not present

## 2018-03-03 DIAGNOSIS — Z951 Presence of aortocoronary bypass graft: Secondary | ICD-10-CM | POA: Insufficient documentation

## 2018-03-03 DIAGNOSIS — N1339 Other hydronephrosis: Secondary | ICD-10-CM | POA: Diagnosis not present

## 2018-03-03 LAB — LIPASE, BLOOD: LIPASE: 24 U/L (ref 11–51)

## 2018-03-03 LAB — CBC WITH DIFFERENTIAL/PLATELET
BASOS PCT: 0 %
Basophils Absolute: 0 10*3/uL (ref 0.0–0.1)
EOS ABS: 0.2 10*3/uL (ref 0.0–0.7)
Eosinophils Relative: 2 %
HEMATOCRIT: 31.5 % — AB (ref 39.0–52.0)
Hemoglobin: 10.2 g/dL — ABNORMAL LOW (ref 13.0–17.0)
Lymphocytes Relative: 12 %
Lymphs Abs: 0.9 10*3/uL (ref 0.7–4.0)
MCH: 28.7 pg (ref 26.0–34.0)
MCHC: 32.4 g/dL (ref 30.0–36.0)
MCV: 88.5 fL (ref 78.0–100.0)
MONO ABS: 0.5 10*3/uL (ref 0.1–1.0)
MONOS PCT: 7 %
Neutro Abs: 5.9 10*3/uL (ref 1.7–7.7)
Neutrophils Relative %: 79 %
Platelets: 256 10*3/uL (ref 150–400)
RBC: 3.56 MIL/uL — ABNORMAL LOW (ref 4.22–5.81)
RDW: 13.9 % (ref 11.5–15.5)
WBC: 7.5 10*3/uL (ref 4.0–10.5)

## 2018-03-03 LAB — URINALYSIS, ROUTINE W REFLEX MICROSCOPIC
Bilirubin Urine: NEGATIVE
Glucose, UA: NEGATIVE mg/dL
Hgb urine dipstick: NEGATIVE
KETONES UR: NEGATIVE mg/dL
LEUKOCYTES UA: NEGATIVE
NITRITE: NEGATIVE
PROTEIN: NEGATIVE mg/dL
Specific Gravity, Urine: 1.009 (ref 1.005–1.030)
pH: 5 (ref 5.0–8.0)

## 2018-03-03 LAB — COMPREHENSIVE METABOLIC PANEL
ALBUMIN: 3 g/dL — AB (ref 3.5–5.0)
ALT: 16 U/L (ref 0–44)
ANION GAP: 7 (ref 5–15)
AST: 21 U/L (ref 15–41)
Alkaline Phosphatase: 65 U/L (ref 38–126)
BILIRUBIN TOTAL: 1 mg/dL (ref 0.3–1.2)
BUN: 37 mg/dL — ABNORMAL HIGH (ref 8–23)
CO2: 29 mmol/L (ref 22–32)
Calcium: 9 mg/dL (ref 8.9–10.3)
Chloride: 106 mmol/L (ref 98–111)
Creatinine, Ser: 1.45 mg/dL — ABNORMAL HIGH (ref 0.61–1.24)
GFR, EST AFRICAN AMERICAN: 48 mL/min — AB (ref >60)
GFR, EST NON AFRICAN AMERICAN: 42 mL/min — AB (ref >60)
Glucose, Bld: 66 mg/dL — ABNORMAL LOW (ref 70–99)
POTASSIUM: 3.8 mmol/L (ref 3.5–5.1)
Sodium: 142 mmol/L (ref 135–145)
TOTAL PROTEIN: 6.3 g/dL — AB (ref 6.5–8.1)

## 2018-03-03 LAB — CBG MONITORING, ED
GLUCOSE-CAPILLARY: 53 mg/dL — AB (ref 70–99)
GLUCOSE-CAPILLARY: 111 mg/dL — AB (ref 70–99)

## 2018-03-03 MED ORDER — DEXTROSE 50 % IV SOLN
INTRAVENOUS | Status: AC
  Filled 2018-03-03: qty 50

## 2018-03-03 MED ORDER — POLYETHYLENE GLYCOL 3350 17 G PO PACK: 17.0000 g | PACK | Freq: Every day | ORAL | 0 refills | Status: DC

## 2018-03-03 MED ORDER — IOPAMIDOL (ISOVUE-300) INJECTION 61%
100.0000 mL | Freq: Once | INTRAVENOUS | Status: AC | PRN
  Administered 2018-03-03: 100 mL via INTRAVENOUS

## 2018-03-03 MED ORDER — DEXTROSE 50 % IV SOLN
1.0000 | Freq: Once | INTRAVENOUS | Status: AC
  Administered 2018-03-03: 50 mL via INTRAVENOUS

## 2018-03-03 NOTE — ED Notes (Signed)
Condom cath placed.

## 2018-03-03 NOTE — ED Provider Notes (Signed)
[facility] EMERGENCY DEPARTMENT Provider Note   CSN: [id number] Arrival date & time: 03/03/18  6063     History   Chief Complaint Chief Complaint  Patient presents with  . abd swelling    HPI Neil [name] is a 84 y.o. male.  The history is provided by the patient. No language interpreter was used.  Abdominal Pain   This is a new problem. The current episode started 2 days ago. The problem occurs constantly. The problem has been gradually worsening. The pain is associated with an unknown factor. The pain is located in the generalized abdominal region. The pain is at a severity of 4/10. The pain is moderate. Pertinent negatives include fever. Nothing aggravates the symptoms. Nothing relieves the symptoms. Past workup does not include GI consult. His past medical history does not include PUD.  Pt has abdominal swelling that started this week.  Pt was recently treated for a uti and pneumonia  Past Medical History:  Diagnosis Date  . Atrial fibrillation ([place])   . Chronic kidney disease (CKD), stage III (moderate) (HCC)   . Complete heart block ([place])   . Coronary artery disease    status post CABG1999  . Dementia   . Diabetes mellitus   . Dyslipidemia   . Hypertension   . Osteoarthritis   . Pacemaker    St Jude status post generator replacement 2007  . Renal artery stenosis (HCC)    95%  left, 50% right    Patient Active Problem List   Diagnosis Date Noted  . Aspiration pneumonia ([place]) 02/25/2018  . UTI (urinary tract infection) 02/25/2018  . Protein-calorie malnutrition, severe 12/18/2017  . Hypoglycemia due to type 2 diabetes mellitus ([place]) 12/17/2017  . Confusion   . General weakness   . Dehydration 11/09/2017  . FTT (failure to thrive) in adult 11/09/2017  . AKI (acute kidney injury) ([place]) 11/09/2017  . Dementia 11/09/2017  . Renal artery stenosis ([place])   . CKD (chronic kidney disease) stage 3, GFR 30-59 ml/min (HCC)   . Pacemaker-St. Jude 04/20/2011  .  Permanent atrial fibrillation ([place]) 05/03/2010  . AV BLOCK, COMPLETE 11/02/2009  . ABDOMINAL PAIN-EPIGASTRIC 10/27/2008  . AODM 07/25/2008  . Type 2 diabetes mellitus with hyperlipidemia ([place]) 07/25/2008  . Cardiomyopathy, ischemic 07/25/2008  . ATHEROSCLEROSIS OF RENAL ARTERY 07/25/2008  . HYPERTENSION, BENIGN 07/25/2008    Past Surgical History:  Procedure Laterality Date  . CORONARY ARTERY BYPASS GRAFT     with stenting  . PACEMAKER INSERTION          Home Medications    Prior to Admission medications   Medication Sig Start Date End Date Taking? Authorizing Provider  albuterol (PROVENTIL) (2.5 MG/3ML) 0.083% nebulizer solution Take 3 mLs (2.5 mg total) by nebulization every 6 (six) hours as needed for wheezing or shortness of breath. 02/27/18  Yes Kathie Dike, MD  atorvastatin (LIPITOR) 40 MG tablet Take 40 mg by mouth daily.     Yes [provider]  calcitRIOL (ROCALTROL) 0.25 MCG capsule Take 0.25 mcg by mouth every Monday, Wednesday, and Friday.   Yes [provider]  carvedilol (COREG) 6.25 MG tablet Take 6.25 mg by mouth daily.    Yes [provider]  dabigatran (PRADAXA) 75 MG CAPS capsule Take 1 capsule (75 mg total) by mouth every 12 (twelve) hours. Patient taking differently: Take 75 mg by mouth daily.  11/11/17  Yes Isaac Bliss, Rayford Halsted, MD  feeding supplement, ENSURE ENLIVE, (ENSURE ENLIVE) LIQD Take  237 mLs by mouth 2 (two) times daily between meals. 12/18/17  Yes Shah, Pratik D, DO  furosemide (LASIX) 40 MG tablet Take 1 tablet (40 mg total) by mouth daily. 02/27/18 02/27/19 Yes Kathie Dike, MD  glipiZIDE (GLUCOTROL XL) 5 MG 24 hr tablet Take 10 mg by mouth 2 (two) times daily.    Yes Redmond School, MD  Maltodextrin-Xanthan Gum [facility] CLEAR) POWD Thicken fluids to nectar thick 02/27/18  Yes Memon, Jolaine Artist, MD  pantoprazole (PROTONIX) 40 MG tablet Take 1 tablet (40 mg total) by mouth daily. 02/28/18  Yes Kathie Dike, MD  rOPINIRole (REQUIP) 0.25 MG tablet Take 0.25 mg by mouth at bedtime.   Yes [provider]  polyethylene glycol (MIRALAX) packet Take 17 g by mouth daily. 03/03/18   Fransico Meadow, PA-C    Family History Family History  Problem Relation Age of Onset  . Other Mother   . Cancer Mother        breast  . Alzheimer's disease Sister     Social History Social History   Tobacco Use  . Smoking status: Never Smoker  . Smokeless tobacco: Never Used  Substance Use Topics  . Alcohol use: No  . Drug use: No     Allergies   Keflex [cephalexin] and Penicillins   Review of Systems Review of Systems  Constitutional: Negative for fever.  Gastrointestinal: Positive for abdominal pain.     Physical Exam Updated Vital Signs BP (!) 132/95   Pulse 78   Temp 98.1 F (36.7 C) (Oral)   Resp 18   Wt 72.5 kg   SpO2 98%   BMI 23.60 kg/m   Physical Exam  Constitutional: He is oriented to person, place, and time. He appears well-developed and well-nourished.  HENT:  Head: Normocephalic.  Mouth/Throat: Oropharynx is clear and moist.  Eyes: EOM are normal.  Neck: Normal range of motion.  Cardiovascular: Normal rate and regular rhythm.  Pulmonary/Chest: Effort normal.  Abdominal: He exhibits no distension.  Musculoskeletal: Normal range of motion.  Neurological: He is alert and oriented to person, place, and time.  Skin: Skin is warm.  Psychiatric: He has a normal mood and affect.  Nursing note and vitals reviewed.    ED Treatments / Results  Labs (all labs ordered are listed, but only abnormal results are displayed) Labs Reviewed  CBC WITH DIFFERENTIAL/PLATELET - Abnormal; Notable for the following components:      Result Value   RBC 3.56 (*)    Hemoglobin 10.2 (*)    HCT 31.5 (*)    All other components within normal limits  COMPREHENSIVE METABOLIC PANEL - Abnormal; Notable for the following components:   Glucose, Bld 66 (*)    BUN 37 (*)     Creatinine, Ser 1.45 (*)    Total Protein 6.3 (*)    Albumin 3.0 (*)    GFR calc non Af Amer 42 (*)    GFR calc Af Amer 48 (*)    All other components within normal limits  CBG MONITORING, ED - Abnormal; Notable for the following components:   Glucose-Capillary 53 (*)    All other components within normal limits  CBG MONITORING, ED - Abnormal; Notable for the following components:   Glucose-Capillary 111 (*)    All other components within normal limits  URINALYSIS, ROUTINE W REFLEX MICROSCOPIC  LIPASE, BLOOD    EKG None  Radiology Ct Abdomen Pelvis W Contrast  Result Date: 03/03/2018 CLINICAL DATA:  Large abdominal  mass EXAM: CT ABDOMEN AND PELVIS WITH CONTRAST TECHNIQUE: Multidetector CT imaging of the abdomen and pelvis was performed using the standard protocol following bolus administration of intravenous contrast. CONTRAST:  17mL ISOVUE-300 IOPAMIDOL (ISOVUE-300) INJECTION 61% COMPARISON:  None. FINDINGS: Lower chest: Small bilateral pleural effusions with dependent atelectasis. Severe cardiomegaly. Pacemaker device in place. Coronary artery calcifications. Hepatobiliary: The contour of the liver is nodular compatible with early cirrhotic change. Unremarkable gallbladder. Pancreas: Unremarkable Spleen: Unremarkable Adrenals/Urinary Tract: Moderate bilateral hydronephrosis. The bladder is severely dilated. Adrenal glands are unremarkable. There simple cysts in the kidneys. Stomach/Bowel: Normal appendix. No obvious mass in the colon. No evidence of small-bowel obstruction. Sigmoid diverticulosis. Fecal impaction in the rectum. There is wall thickening of the distal rectum. The rectum is dilated. Vascular/Lymphatic: Extensive aortic and iliac atherosclerotic calcifications are noted. The aorta is grossly patent without aneurysmal dilatation. There is no abnormal retroperitoneal adenopathy. Reproductive: Prostate is normal in size. Other: No free fluid. Musculoskeletal: No vertebral  compression deformity. Lumbar degenerative disc disease is present. IMPRESSION: There is severe dilatation of the bladder and bilateral hydronephrosis. These findings raise the possibility of bladder outlet obstruction. Foley catheter placement may be helpful. There is fecal impaction in the rectum. Wall thickening of the distal rectum is noted and inflammation of the distal rectal wall may be present. Bilateral pleural effusions. Early cirrhotic change in the liver. Electronically Signed   By: Marybelle Killings M.D.   On: 03/03/2018 13:59   Dg Abd Acute W/chest  Result Date: 03/03/2018 CLINICAL DATA:  Abdominal swelling and pain EXAM: DG ABDOMEN ACUTE W/ 1V CHEST COMPARISON:  Chest radiograph 03/01/2018 FINDINGS: Dual lead pacer in prior CABG noted. Coronary stent. Mild enlargement of the cardiopericardial silhouette noted with some indistinctness of the pulmonary vasculature which may be from pulmonary venous hypertension. No overt edema. Trace blunting of the costophrenic angles could reflect small pleural effusions. Bilateral lower rib deformities from age indeterminate fractures. Contrast medium throughout the colon likely from recent modified barium swallow exam. Extensive sigmoid colon diverticulosis. No dilated bowel. No abnormal air-fluid levels. Bony demineralization.  Aortoiliac atherosclerotic vascular disease. Dense calcification of the splenic artery. IMPRESSION: 1. No dilated bowel identified. 2. Prominent sigmoid colon diverticulosis. 3. Aortic Atherosclerosis (ICD10-I70.0). Splenic artery calcification. 4. Mild enlargement of the cardiopericardial silhouette with potential pulmonary venous hypertension but no overt edema. 5. Mild blunting of the lateral costophrenic angles, cannot exclude small pleural effusions. 6. Bilateral lower rib deformities from age indeterminate fractures. Electronically Signed   By: Van Clines M.D.   On: 03/03/2018 10:57    Procedures Procedures (including  critical care time)  Medications Ordered in ED Medications  iopamidol (ISOVUE-300) 61 % injection 100 mL (100 mLs Intravenous Contrast Given 03/03/18 1321)  dextrose 50 % solution 50 mL (50 mLs Intravenous Given 03/03/18 1217)     Initial Impression / Assessment and Plan / ED Course  I have reviewed the triage vital signs and the nursing notes.  Pertinent labs & imaging results that were available during my care of the patient were reviewed by me and considered in my medical decision making (see chart for details).     MDM   Dr. Lacinda Axon in to see.  He saw pt recently for admission.  Pt's glucose was low at 66.  Pt was given D50.   Xray shows no evidence of pneumonia.  Urine no uti.   Ct scan shows bladder distention and constipation.   Foley to drain and leg bag.  Rx for  miralax given.    See urology for recheck.  See Dr. Gerarda Fraction for recheck   Final Clinical Impressions(s) / ED Diagnoses   Final diagnoses:  Urinary retention  Constipation, unspecified constipation type    ED Discharge Orders         Ordered    polyethylene glycol (MIRALAX) packet  Daily     03/03/18 1504        An After Visit Summary was printed and given to the patient.    Sidney Ace 03/03/18 1549    Nat Christen, MD 03/04/18 1410

## 2018-03-03 NOTE — ED Notes (Signed)
Pt to xray

## 2018-03-03 NOTE — Discharge Instructions (Signed)
Return if any problems.  See Dr. Gerarda Fraction for recheck this week.  Schedule to see the Urologist for evaluation

## 2018-03-03 NOTE — ED Notes (Signed)
Family member to [facility]  To get something for pt to eat as there is no apple sauce in dept

## 2018-03-03 NOTE — ED Triage Notes (Signed)
Caregiver brought pt in for abd swelling and BLE edema. Was seen two days ago and had a chest x-ray that showed possible CHF. Tx for kidney infection and pneumonia per pt.

## 2018-03-03 NOTE — ED Notes (Signed)
To CT

## 2018-03-03 NOTE — ED Notes (Addendum)
Pt has large mass to the front of abdomen. Only able to be seen when lying flat. No pain. Pt also has 3+ pitting edema in legs.

## 2018-03-03 NOTE — ED Notes (Signed)
Pt awaits dispo and reeval

## 2018-03-03 NOTE — ED Notes (Signed)
From CT 

## 2018-03-04 ENCOUNTER — Other Ambulatory Visit: Payer: Self-pay

## 2018-03-04 ENCOUNTER — Ambulatory Visit: Payer: Self-pay

## 2018-03-04 DIAGNOSIS — I255 Ischemic cardiomyopathy: Secondary | ICD-10-CM | POA: Diagnosis not present

## 2018-03-04 DIAGNOSIS — I129 Hypertensive chronic kidney disease with stage 1 through stage 4 chronic kidney disease, or unspecified chronic kidney disease: Secondary | ICD-10-CM | POA: Diagnosis not present

## 2018-03-04 DIAGNOSIS — N390 Urinary tract infection, site not specified: Secondary | ICD-10-CM | POA: Diagnosis not present

## 2018-03-04 DIAGNOSIS — F0390 Unspecified dementia without behavioral disturbance: Secondary | ICD-10-CM | POA: Diagnosis not present

## 2018-03-04 DIAGNOSIS — E1122 Type 2 diabetes mellitus with diabetic chronic kidney disease: Secondary | ICD-10-CM | POA: Diagnosis not present

## 2018-03-04 DIAGNOSIS — J690 Pneumonitis due to inhalation of food and vomit: Secondary | ICD-10-CM | POA: Diagnosis not present

## 2018-03-04 NOTE — Patient Outreach (Signed)
[facility]) Care Management  03/04/2018  CRISTOBAL ADVANI 1928-08-28 [id number]   RN CM spoke with Mr. Stoneking spouse [name]. She reported Mr. Dentinger is "doing a lot better." She reported that he has several appointments this week and will follow up with Dr. Gerarda Fraction on tomorrow. She informed RN CM that caregiver Modena Nunnery was not available at the time of the call to provide information regarding Mr. Poynter's pending appointments this week,  but was agreeable to home visit on next week.   PLAN Will follow up with caregiver [name]. Will follow up for home visit on next week.   [place] [phone]

## 2018-03-05 DIAGNOSIS — I129 Hypertensive chronic kidney disease with stage 1 through stage 4 chronic kidney disease, or unspecified chronic kidney disease: Secondary | ICD-10-CM | POA: Diagnosis not present

## 2018-03-05 DIAGNOSIS — N390 Urinary tract infection, site not specified: Secondary | ICD-10-CM | POA: Diagnosis not present

## 2018-03-05 DIAGNOSIS — J690 Pneumonitis due to inhalation of food and vomit: Secondary | ICD-10-CM | POA: Diagnosis not present

## 2018-03-05 DIAGNOSIS — F0390 Unspecified dementia without behavioral disturbance: Secondary | ICD-10-CM | POA: Diagnosis not present

## 2018-03-05 DIAGNOSIS — I255 Ischemic cardiomyopathy: Secondary | ICD-10-CM | POA: Diagnosis not present

## 2018-03-05 DIAGNOSIS — E1122 Type 2 diabetes mellitus with diabetic chronic kidney disease: Secondary | ICD-10-CM | POA: Diagnosis not present

## 2018-03-06 DIAGNOSIS — N390 Urinary tract infection, site not specified: Secondary | ICD-10-CM | POA: Diagnosis not present

## 2018-03-06 DIAGNOSIS — E1122 Type 2 diabetes mellitus with diabetic chronic kidney disease: Secondary | ICD-10-CM | POA: Diagnosis not present

## 2018-03-06 DIAGNOSIS — I129 Hypertensive chronic kidney disease with stage 1 through stage 4 chronic kidney disease, or unspecified chronic kidney disease: Secondary | ICD-10-CM | POA: Diagnosis not present

## 2018-03-06 DIAGNOSIS — I255 Ischemic cardiomyopathy: Secondary | ICD-10-CM | POA: Diagnosis not present

## 2018-03-06 DIAGNOSIS — F0390 Unspecified dementia without behavioral disturbance: Secondary | ICD-10-CM | POA: Diagnosis not present

## 2018-03-06 DIAGNOSIS — J690 Pneumonitis due to inhalation of food and vomit: Secondary | ICD-10-CM | POA: Diagnosis not present

## 2018-03-07 DIAGNOSIS — J690 Pneumonitis due to inhalation of food and vomit: Secondary | ICD-10-CM | POA: Diagnosis not present

## 2018-03-07 DIAGNOSIS — E1122 Type 2 diabetes mellitus with diabetic chronic kidney disease: Secondary | ICD-10-CM | POA: Diagnosis not present

## 2018-03-07 DIAGNOSIS — F0390 Unspecified dementia without behavioral disturbance: Secondary | ICD-10-CM | POA: Diagnosis not present

## 2018-03-07 DIAGNOSIS — I129 Hypertensive chronic kidney disease with stage 1 through stage 4 chronic kidney disease, or unspecified chronic kidney disease: Secondary | ICD-10-CM | POA: Diagnosis not present

## 2018-03-07 DIAGNOSIS — N390 Urinary tract infection, site not specified: Secondary | ICD-10-CM | POA: Diagnosis not present

## 2018-03-07 DIAGNOSIS — I255 Ischemic cardiomyopathy: Secondary | ICD-10-CM | POA: Diagnosis not present

## 2018-03-12 ENCOUNTER — Other Ambulatory Visit: Payer: Self-pay

## 2018-03-12 DIAGNOSIS — R5383 Other fatigue: Secondary | ICD-10-CM | POA: Diagnosis not present

## 2018-03-12 DIAGNOSIS — N320 Bladder-neck obstruction: Secondary | ICD-10-CM | POA: Diagnosis not present

## 2018-03-12 DIAGNOSIS — J690 Pneumonitis due to inhalation of food and vomit: Secondary | ICD-10-CM | POA: Diagnosis not present

## 2018-03-12 DIAGNOSIS — D510 Vitamin B12 deficiency anemia due to intrinsic factor deficiency: Secondary | ICD-10-CM | POA: Diagnosis not present

## 2018-03-12 DIAGNOSIS — E119 Type 2 diabetes mellitus without complications: Secondary | ICD-10-CM | POA: Diagnosis not present

## 2018-03-12 DIAGNOSIS — R338 Other retention of urine: Secondary | ICD-10-CM | POA: Diagnosis not present

## 2018-03-12 DIAGNOSIS — I129 Hypertensive chronic kidney disease with stage 1 through stage 4 chronic kidney disease, or unspecified chronic kidney disease: Secondary | ICD-10-CM | POA: Diagnosis not present

## 2018-03-12 DIAGNOSIS — R1310 Dysphagia, unspecified: Secondary | ICD-10-CM | POA: Diagnosis not present

## 2018-03-12 DIAGNOSIS — E1122 Type 2 diabetes mellitus with diabetic chronic kidney disease: Secondary | ICD-10-CM | POA: Diagnosis not present

## 2018-03-12 DIAGNOSIS — F0390 Unspecified dementia without behavioral disturbance: Secondary | ICD-10-CM | POA: Diagnosis not present

## 2018-03-12 DIAGNOSIS — I10 Essential (primary) hypertension: Secondary | ICD-10-CM | POA: Diagnosis not present

## 2018-03-12 DIAGNOSIS — N390 Urinary tract infection, site not specified: Secondary | ICD-10-CM | POA: Diagnosis not present

## 2018-03-12 DIAGNOSIS — R600 Localized edema: Secondary | ICD-10-CM | POA: Diagnosis not present

## 2018-03-12 DIAGNOSIS — D649 Anemia, unspecified: Secondary | ICD-10-CM | POA: Diagnosis not present

## 2018-03-12 DIAGNOSIS — N183 Chronic kidney disease, stage 3 (moderate): Secondary | ICD-10-CM | POA: Diagnosis not present

## 2018-03-12 DIAGNOSIS — Z681 Body mass index (BMI) 19 or less, adult: Secondary | ICD-10-CM | POA: Diagnosis not present

## 2018-03-12 DIAGNOSIS — E1129 Type 2 diabetes mellitus with other diabetic kidney complication: Secondary | ICD-10-CM | POA: Diagnosis not present

## 2018-03-12 DIAGNOSIS — I255 Ischemic cardiomyopathy: Secondary | ICD-10-CM | POA: Diagnosis not present

## 2018-03-12 NOTE — Patient Outreach (Signed)
[facility]) Care Management  03/12/2018  Neil [name] 1929/04/19 [id number]   Successful outreach with member's caregiver [name].  Member not available for home visit this week but doing well. [name] agreed to contact RNCM if needed.  [place] [phone]

## 2018-03-13 DIAGNOSIS — I129 Hypertensive chronic kidney disease with stage 1 through stage 4 chronic kidney disease, or unspecified chronic kidney disease: Secondary | ICD-10-CM | POA: Diagnosis not present

## 2018-03-13 DIAGNOSIS — J690 Pneumonitis due to inhalation of food and vomit: Secondary | ICD-10-CM | POA: Diagnosis not present

## 2018-03-13 DIAGNOSIS — N390 Urinary tract infection, site not specified: Secondary | ICD-10-CM | POA: Diagnosis not present

## 2018-03-13 DIAGNOSIS — I255 Ischemic cardiomyopathy: Secondary | ICD-10-CM | POA: Diagnosis not present

## 2018-03-13 DIAGNOSIS — F0390 Unspecified dementia without behavioral disturbance: Secondary | ICD-10-CM | POA: Diagnosis not present

## 2018-03-13 DIAGNOSIS — E1122 Type 2 diabetes mellitus with diabetic chronic kidney disease: Secondary | ICD-10-CM | POA: Diagnosis not present

## 2018-03-14 DIAGNOSIS — I255 Ischemic cardiomyopathy: Secondary | ICD-10-CM | POA: Diagnosis not present

## 2018-03-14 DIAGNOSIS — I129 Hypertensive chronic kidney disease with stage 1 through stage 4 chronic kidney disease, or unspecified chronic kidney disease: Secondary | ICD-10-CM | POA: Diagnosis not present

## 2018-03-14 DIAGNOSIS — J690 Pneumonitis due to inhalation of food and vomit: Secondary | ICD-10-CM | POA: Diagnosis not present

## 2018-03-14 DIAGNOSIS — E1122 Type 2 diabetes mellitus with diabetic chronic kidney disease: Secondary | ICD-10-CM | POA: Diagnosis not present

## 2018-03-14 DIAGNOSIS — N390 Urinary tract infection, site not specified: Secondary | ICD-10-CM | POA: Diagnosis not present

## 2018-03-14 DIAGNOSIS — F0390 Unspecified dementia without behavioral disturbance: Secondary | ICD-10-CM | POA: Diagnosis not present

## 2018-03-19 ENCOUNTER — Other Ambulatory Visit: Payer: Self-pay

## 2018-03-19 DIAGNOSIS — F0390 Unspecified dementia without behavioral disturbance: Secondary | ICD-10-CM | POA: Diagnosis not present

## 2018-03-19 DIAGNOSIS — J690 Pneumonitis due to inhalation of food and vomit: Secondary | ICD-10-CM | POA: Diagnosis not present

## 2018-03-19 DIAGNOSIS — I129 Hypertensive chronic kidney disease with stage 1 through stage 4 chronic kidney disease, or unspecified chronic kidney disease: Secondary | ICD-10-CM | POA: Diagnosis not present

## 2018-03-19 DIAGNOSIS — E1122 Type 2 diabetes mellitus with diabetic chronic kidney disease: Secondary | ICD-10-CM | POA: Diagnosis not present

## 2018-03-19 DIAGNOSIS — I255 Ischemic cardiomyopathy: Secondary | ICD-10-CM | POA: Diagnosis not present

## 2018-03-19 DIAGNOSIS — N390 Urinary tract infection, site not specified: Secondary | ICD-10-CM | POA: Diagnosis not present

## 2018-03-20 ENCOUNTER — Other Ambulatory Visit (HOSPITAL_COMMUNITY)
Admission: AD | Admit: 2018-03-20 | Discharge: 2018-03-20 | Disposition: A | Discharge status: Home / Self Care | Payer: Medicare Other | Source: Skilled Nursing Facility | Attending: Urology | Admitting: Urology

## 2018-03-20 DIAGNOSIS — I255 Ischemic cardiomyopathy: Secondary | ICD-10-CM | POA: Diagnosis not present

## 2018-03-20 DIAGNOSIS — F0390 Unspecified dementia without behavioral disturbance: Secondary | ICD-10-CM | POA: Diagnosis not present

## 2018-03-20 DIAGNOSIS — N390 Urinary tract infection, site not specified: Secondary | ICD-10-CM | POA: Diagnosis not present

## 2018-03-20 DIAGNOSIS — J690 Pneumonitis due to inhalation of food and vomit: Secondary | ICD-10-CM | POA: Diagnosis not present

## 2018-03-20 DIAGNOSIS — E1122 Type 2 diabetes mellitus with diabetic chronic kidney disease: Secondary | ICD-10-CM | POA: Diagnosis not present

## 2018-03-20 DIAGNOSIS — I129 Hypertensive chronic kidney disease with stage 1 through stage 4 chronic kidney disease, or unspecified chronic kidney disease: Secondary | ICD-10-CM | POA: Diagnosis not present

## 2018-03-20 LAB — URINALYSIS, COMPLETE (UACMP) WITH MICROSCOPIC
Bacteria, UA: NONE SEEN
Bilirubin Urine: NEGATIVE
Glucose, UA: NEGATIVE mg/dL
HGB URINE DIPSTICK: NEGATIVE
Ketones, ur: NEGATIVE mg/dL
Nitrite: NEGATIVE
Protein, ur: NEGATIVE mg/dL
SPECIFIC GRAVITY, URINE: 1.017 (ref 1.005–1.030)
pH: 6 (ref 5.0–8.0)

## 2018-03-20 NOTE — Patient Outreach (Signed)
[facility]) Care Management  03/19/2018  MAISEN KLINGLER 11/15/1928 [id number]   Outreach with caregiver Modena Nunnery. Expressed concerns that Mr. Catlin was "very restless" for the past few days. No falls reported. Also concerned about his urine output. Home Health nurse was in the home at the time of the call. Discussed precautions and s/sx of urinary tract infection due to member's decreased output and behavior changes. Per Modena Nunnery, [facility] nurse will obtain sample for testing. Verbalized understanding of worsening s/sx that require immediate medical attention. Agreed to notify RNCM with questions and concerns as needed.  PLAN Pending follow up/results from MD. Will continue outreach as needed.  [place] [phone]

## 2018-03-21 ENCOUNTER — Encounter (HOSPITAL_COMMUNITY): Payer: Self-pay | Admitting: Emergency Medicine

## 2018-03-21 ENCOUNTER — Emergency Department (HOSPITAL_COMMUNITY)
Admission: EM | Admit: 2018-03-21 | Discharge: 2018-03-21 | Disposition: A | Discharge status: Home / Self Care | Payer: Medicare Other | Attending: Emergency Medicine | Admitting: Emergency Medicine

## 2018-03-21 ENCOUNTER — Other Ambulatory Visit: Payer: Self-pay

## 2018-03-21 DIAGNOSIS — Z7984 Long term (current) use of oral hypoglycemic drugs: Secondary | ICD-10-CM | POA: Insufficient documentation

## 2018-03-21 DIAGNOSIS — R441 Visual hallucinations: Secondary | ICD-10-CM | POA: Diagnosis not present

## 2018-03-21 DIAGNOSIS — Z951 Presence of aortocoronary bypass graft: Secondary | ICD-10-CM | POA: Diagnosis not present

## 2018-03-21 DIAGNOSIS — N183 Chronic kidney disease, stage 3 (moderate): Secondary | ICD-10-CM | POA: Diagnosis not present

## 2018-03-21 DIAGNOSIS — E1122 Type 2 diabetes mellitus with diabetic chronic kidney disease: Secondary | ICD-10-CM | POA: Diagnosis not present

## 2018-03-21 DIAGNOSIS — Z79899 Other long term (current) drug therapy: Secondary | ICD-10-CM | POA: Diagnosis not present

## 2018-03-21 DIAGNOSIS — I129 Hypertensive chronic kidney disease with stage 1 through stage 4 chronic kidney disease, or unspecified chronic kidney disease: Secondary | ICD-10-CM | POA: Insufficient documentation

## 2018-03-21 DIAGNOSIS — Z950 Presence of cardiac pacemaker: Secondary | ICD-10-CM | POA: Diagnosis not present

## 2018-03-21 DIAGNOSIS — I2510 Atherosclerotic heart disease of native coronary artery without angina pectoris: Secondary | ICD-10-CM | POA: Insufficient documentation

## 2018-03-21 DIAGNOSIS — N390 Urinary tract infection, site not specified: Secondary | ICD-10-CM | POA: Diagnosis not present

## 2018-03-21 DIAGNOSIS — F0390 Unspecified dementia without behavioral disturbance: Secondary | ICD-10-CM | POA: Diagnosis not present

## 2018-03-21 LAB — URINALYSIS, ROUTINE W REFLEX MICROSCOPIC
Bilirubin Urine: NEGATIVE
Glucose, UA: NEGATIVE mg/dL
Hgb urine dipstick: NEGATIVE
Ketones, ur: NEGATIVE mg/dL
Nitrite: NEGATIVE
Protein, ur: NEGATIVE mg/dL
Specific Gravity, Urine: 1.018 (ref 1.005–1.030)
pH: 5 (ref 5.0–8.0)

## 2018-03-21 LAB — CBC WITH DIFFERENTIAL/PLATELET
Basophils Absolute: 0 10*3/uL (ref 0.0–0.1)
Basophils Relative: 0 %
Eosinophils Absolute: 0.2 10*3/uL (ref 0.0–0.7)
Eosinophils Relative: 3 %
HCT: 32.5 % — ABNORMAL LOW (ref 39.0–52.0)
Hemoglobin: 10.4 g/dL — ABNORMAL LOW (ref 13.0–17.0)
Lymphocytes Relative: 17 %
Lymphs Abs: 1 10*3/uL (ref 0.7–4.0)
MCH: 27.6 pg (ref 26.0–34.0)
MCHC: 32 g/dL (ref 30.0–36.0)
MCV: 86.2 fL (ref 78.0–100.0)
Monocytes Absolute: 0.4 10*3/uL (ref 0.1–1.0)
Monocytes Relative: 6 %
Neutro Abs: 4.4 10*3/uL (ref 1.7–7.7)
Neutrophils Relative %: 74 %
Platelets: 161 10*3/uL (ref 150–400)
RBC: 3.77 MIL/uL — ABNORMAL LOW (ref 4.22–5.81)
RDW: 14.8 % (ref 11.5–15.5)
WBC: 6 10*3/uL (ref 4.0–10.5)

## 2018-03-21 LAB — COMPREHENSIVE METABOLIC PANEL
ALT: 15 U/L (ref 0–44)
AST: 23 U/L (ref 15–41)
Albumin: 3.2 g/dL — ABNORMAL LOW (ref 3.5–5.0)
Alkaline Phosphatase: 82 U/L (ref 38–126)
Anion gap: 10 (ref 5–15)
BUN: 38 mg/dL — ABNORMAL HIGH (ref 8–23)
CO2: 26 mmol/L (ref 22–32)
Calcium: 9.1 mg/dL (ref 8.9–10.3)
Chloride: 102 mmol/L (ref 98–111)
Creatinine, Ser: 1.35 mg/dL — ABNORMAL HIGH (ref 0.61–1.24)
GFR calc Af Amer: 53 mL/min — ABNORMAL LOW (ref >60)
GFR calc non Af Amer: 46 mL/min — ABNORMAL LOW (ref >60)
Glucose, Bld: 164 mg/dL — ABNORMAL HIGH (ref 70–99)
Potassium: 4.6 mmol/L (ref 3.5–5.1)
Sodium: 138 mmol/L (ref 135–145)
Total Bilirubin: 1 mg/dL (ref 0.3–1.2)
Total Protein: 6.6 g/dL (ref 6.5–8.1)

## 2018-03-21 LAB — CBG MONITORING, ED: Glucose-Capillary: 125 mg/dL — ABNORMAL HIGH (ref 70–99)

## 2018-03-21 MED ORDER — SULFAMETHOXAZOLE-TRIMETHOPRIM 800-160 MG PO TABS: 1.0000 | ORAL_TABLET | Freq: Two times a day (BID) | ORAL | 0 refills | Status: DC

## 2018-03-21 MED ORDER — CIPROFLOXACIN IN D5W 400 MG/200ML IV SOLN
400.0000 mg | Freq: Once | INTRAVENOUS | Status: AC
  Administered 2018-03-21: 400 mg via INTRAVENOUS
  Filled 2018-03-21: qty 200

## 2018-03-21 MED ORDER — SULFAMETHOXAZOLE-TRIMETHOPRIM 800-160 MG PO TABS: 1.0000 | ORAL_TABLET | Freq: Two times a day (BID) | ORAL | 0 refills | Status: AC

## 2018-03-21 NOTE — ED Notes (Signed)
Pt family was worried that his sugar was low. I checked it and it was 125. Pt sleeping while this is going on.

## 2018-03-21 NOTE — ED Provider Notes (Signed)
[facility] EMERGENCY DEPARTMENT Provider Note   CSN: [id number] Arrival date & time: 03/21/18  1104     History   Chief Complaint Chief Complaint  Patient presents with  . Hallucinations    HPI Neil [name] is a 84 y.o. male.  HPI   84 year old male brought in by family for evaluation of change in mental status.  Since Sunday she has had significant change in sleep patterns.  Staying up at night.  Past few days she has had intermittent visual hallucinations of seeing people outside on the patio.  No fevers.  No recent falls.  Is also been complaining of some pain in his left groin particularly when getting in and out of the car.  Patient himself says that he feels tired.  Has no other complaints currently.  Past Medical History:  Diagnosis Date  . Atrial fibrillation ([place])   . Chronic kidney disease (CKD), stage III (moderate) (HCC)   . Complete heart block ([place])   . Coronary artery disease    status post CABG1999  . Dementia   . Diabetes mellitus   . Dyslipidemia   . Hypertension   . Osteoarthritis   . Pacemaker    St Jude status post generator replacement 2007  . Renal artery stenosis (HCC)    95%  left, 50% right    Patient Active Problem List   Diagnosis Date Noted  . Aspiration pneumonia ([place]) 02/25/2018  . UTI (urinary tract infection) 02/25/2018  . Protein-calorie malnutrition, severe 12/18/2017  . Hypoglycemia due to type 2 diabetes mellitus ([place]) 12/17/2017  . Confusion   . General weakness   . Dehydration 11/09/2017  . FTT (failure to thrive) in adult 11/09/2017  . AKI (acute kidney injury) ([place]) 11/09/2017  . Dementia 11/09/2017  . Renal artery stenosis ([place])   . CKD (chronic kidney disease) stage 3, GFR 30-59 ml/min (HCC)   . Pacemaker-St. Jude 04/20/2011  . Permanent atrial fibrillation ([place]) 05/03/2010  . AV BLOCK, COMPLETE 11/02/2009  . ABDOMINAL PAIN-EPIGASTRIC 10/27/2008  . AODM 07/25/2008  . Type 2 diabetes mellitus with  hyperlipidemia ([place]) 07/25/2008  . Cardiomyopathy, ischemic 07/25/2008  . ATHEROSCLEROSIS OF RENAL ARTERY 07/25/2008  . HYPERTENSION, BENIGN 07/25/2008    Past Surgical History:  Procedure Laterality Date  . CORONARY ARTERY BYPASS GRAFT     with stenting  . PACEMAKER INSERTION          Home Medications    Prior to Admission medications   Medication Sig Start Date End Date Taking? Authorizing Provider  albuterol (PROVENTIL) (2.5 MG/3ML) 0.083% nebulizer solution Take 3 mLs (2.5 mg total) by nebulization every 6 (six) hours as needed for wheezing or shortness of breath. 02/27/18  Yes Kathie Dike, MD  atorvastatin (LIPITOR) 40 MG tablet Take 40 mg by mouth daily.     Yes [provider]  calcitRIOL (ROCALTROL) 0.25 MCG capsule Take 0.25 mcg by mouth every Monday, Wednesday, and Friday.   Yes [provider]  carvedilol (COREG) 6.25 MG tablet Take 6.25 mg by mouth daily.    Yes [provider]  dabigatran (PRADAXA) 75 MG CAPS capsule Take 1 capsule (75 mg total) by mouth every 12 (twelve) hours. Patient taking differently: Take 75 mg by mouth daily.  11/11/17  Yes Isaac Bliss, Rayford Halsted, MD  feeding supplement, ENSURE ENLIVE, (ENSURE ENLIVE) LIQD Take 237 mLs by mouth 2 (two) times daily between meals. 12/18/17  Yes Shah, Pratik D, DO  FEROSUL 325 (65 Fe) MG tablet  Take 1 tablet by mouth daily. 03/14/18  Yes [provider]  furosemide (LASIX) 20 MG tablet Take 1 tablet by mouth daily. 03/12/18  Yes [provider]  glipiZIDE (GLUCOTROL XL) 2.5 MG 24 hr tablet Take 1 tablet by mouth daily. 03/12/18  Yes [provider]  pantoprazole (PROTONIX) 40 MG tablet Take 1 tablet (40 mg total) by mouth daily. 02/28/18  Yes Kathie Dike, MD  polyethylene glycol (MIRALAX) packet Take 17 g by mouth daily. 03/03/18  Yes Caryl Ada K, PA-C  rOPINIRole (REQUIP) 0.25 MG tablet Take 0.25 mg by mouth at bedtime.   Yes [provider]    Maltodextrin-Xanthan Gum ([place]) POWD Thicken fluids to nectar thick Patient not taking: Reported on 03/21/2018 02/27/18   Kathie Dike, MD  sulfamethoxazole-trimethoprim (BACTRIM DS,SEPTRA DS) 800-160 MG tablet Take 1 tablet by mouth 2 (two) times daily for 7 days. 03/21/18 03/28/18  Virgel Manifold, MD    Family History Family History  Problem Relation Age of Onset  . Other Mother   . Cancer Mother        breast  . Alzheimer's disease Sister     Social History Social History   Tobacco Use  . Smoking status: Never Smoker  . Smokeless tobacco: Never Used  Substance Use Topics  . Alcohol use: No  . Drug use: No     Allergies   Keflex [cephalexin] and Penicillins   Review of Systems Review of Systems  All systems reviewed and negative, other than as noted in HPI.  Physical Exam Updated Vital Signs BP (!) 99/57 (BP Location: Right Arm)   Pulse 70   Temp 98 F (36.7 C) (Oral)   Resp 20   Ht 5\' 9"  (1.753 m)   Wt 70.3 kg   SpO2 100%   BMI 22.89 kg/m   Physical Exam  Constitutional: He appears well-developed and well-nourished. No distress.  Laying in bed.  Appears tired, but not toxic.  HENT:  Head: Normocephalic and atraumatic.  Eyes: Conjunctivae are normal. Right eye exhibits no discharge. Left eye exhibits no discharge.  Neck: Neck supple.  Cardiovascular: Normal rate, regular rhythm and normal heart sounds. Exam reveals no gallop and no friction rub.  No murmur heard. Pulmonary/Chest: Effort normal and breath sounds normal. No respiratory distress.  Abdominal: Soft. He exhibits no distension. There is no tenderness.  Abdomen is soft and nontender.  Bladder is nonpalpable.  No concerning skin changes noted to the left inguinal crease, scrotum or perineum.  No adenopathy.  No hernia appreciated.  No apparent discomfort with range of motion or passively of the left hip.  Musculoskeletal: He exhibits no edema or tenderness.  Neurological: He  is alert.  Oriented to self and place.  Follows basic commands.  No focal motor deficit.  Skin: Skin is warm and dry.  Psychiatric: His behavior is normal.  Nursing note and vitals reviewed.    ED Treatments / Results  Labs (all labs ordered are listed, but only abnormal results are displayed) Labs Reviewed  CBC WITH DIFFERENTIAL/PLATELET - Abnormal; Notable for the following components:      Result Value   RBC 3.77 (*)    Hemoglobin 10.4 (*)    HCT 32.5 (*)    All other components within normal limits  COMPREHENSIVE METABOLIC PANEL - Abnormal; Notable for the following components:   Glucose, Bld 164 (*)    BUN 38 (*)    Creatinine, Ser 1.35 (*)    Albumin  3.2 (*)    GFR calc non Af Amer 46 (*)    GFR calc Af Amer 53 (*)    All other components within normal limits  URINALYSIS, ROUTINE W REFLEX MICROSCOPIC - Abnormal; Notable for the following components:   APPearance HAZY (*)    Leukocytes, UA TRACE (*)    Bacteria, UA RARE (*)    All other components within normal limits  CBG MONITORING, ED - Abnormal; Notable for the following components:   Glucose-Capillary 125 (*)    All other components within normal limits  URINE CULTURE    EKG None  Radiology No results found.  Procedures Procedures (including critical care time)  Medications Ordered in ED Medications  ciprofloxacin (CIPRO) IVPB 400 mg (400 mg Intravenous New Bag/Given 03/21/18 1349)     Initial Impression / Assessment and Plan / ED Course  I have reviewed the triage vital signs and the nursing notes.  Pertinent labs & imaging results that were available during my care of the patient were reviewed by me and considered in my medical decision making (see chart for details).     84 year old male with hallucinations and other change in mental status.  Is afebrile.  Nontoxic.  Possible UTI.  Hard to interpret in the setting of an indwelling Foley catheter.  Given the mental status changes though, will  treat.  Will exchange his catheter.  Antibiotics.  Labs otherwise fairly unremarkable.  I am not sure the exact etiology of his left hip/groin pain.  There is no known trauma.  His exam is very reassuring.  He has no apparent discomfort with passive or active range of motion of the hip.  GU exam is unremarkable aside from the Foley.  Abdominal exam is benign.  Will place on antibiotics.  Outpatient follow-up.  I have reviewed the triage vital signs and the nursing notes. Prior records were reviewed for additional information.    Pertinent labs & imaging results that were available during my care of the patient were reviewed by me and considered in my medical decision making (see chart for details).  Final Clinical Impressions(s) / ED Diagnoses   Final diagnoses:  Urinary tract infection without hematuria, site unspecified  Visual hallucinations    ED Discharge Orders         Ordered    sulfamethoxazole-trimethoprim (BACTRIM DS,SEPTRA DS) 800-160 MG tablet  2 times daily     03/21/18 1326           Virgel Manifold, MD 03/21/18 1411

## 2018-03-21 NOTE — ED Triage Notes (Signed)
PT daughter reports pt had admission on 8/19 for UTI and Pneumonia. Daughter reports recurrent hallucinations that started yesterday evening. Home health care sent a UA to the lab yesterday. PCP referral today for further eval.

## 2018-03-22 DIAGNOSIS — I129 Hypertensive chronic kidney disease with stage 1 through stage 4 chronic kidney disease, or unspecified chronic kidney disease: Secondary | ICD-10-CM | POA: Diagnosis not present

## 2018-03-22 DIAGNOSIS — N390 Urinary tract infection, site not specified: Secondary | ICD-10-CM | POA: Diagnosis not present

## 2018-03-22 DIAGNOSIS — I255 Ischemic cardiomyopathy: Secondary | ICD-10-CM | POA: Diagnosis not present

## 2018-03-22 DIAGNOSIS — F0390 Unspecified dementia without behavioral disturbance: Secondary | ICD-10-CM | POA: Diagnosis not present

## 2018-03-22 DIAGNOSIS — J690 Pneumonitis due to inhalation of food and vomit: Secondary | ICD-10-CM | POA: Diagnosis not present

## 2018-03-22 DIAGNOSIS — E1122 Type 2 diabetes mellitus with diabetic chronic kidney disease: Secondary | ICD-10-CM | POA: Diagnosis not present

## 2018-03-22 LAB — URINE CULTURE: Culture: 40000 — AB

## 2018-03-23 LAB — URINE CULTURE: Culture: 30000 — AB

## 2018-03-26 DIAGNOSIS — F0390 Unspecified dementia without behavioral disturbance: Secondary | ICD-10-CM | POA: Diagnosis not present

## 2018-03-26 DIAGNOSIS — I129 Hypertensive chronic kidney disease with stage 1 through stage 4 chronic kidney disease, or unspecified chronic kidney disease: Secondary | ICD-10-CM | POA: Diagnosis not present

## 2018-03-26 DIAGNOSIS — J690 Pneumonitis due to inhalation of food and vomit: Secondary | ICD-10-CM | POA: Diagnosis not present

## 2018-03-26 DIAGNOSIS — E1122 Type 2 diabetes mellitus with diabetic chronic kidney disease: Secondary | ICD-10-CM | POA: Diagnosis not present

## 2018-03-26 DIAGNOSIS — N390 Urinary tract infection, site not specified: Secondary | ICD-10-CM | POA: Diagnosis not present

## 2018-03-26 DIAGNOSIS — I255 Ischemic cardiomyopathy: Secondary | ICD-10-CM | POA: Diagnosis not present

## 2018-03-27 DIAGNOSIS — N390 Urinary tract infection, site not specified: Secondary | ICD-10-CM | POA: Diagnosis not present

## 2018-03-27 DIAGNOSIS — F0390 Unspecified dementia without behavioral disturbance: Secondary | ICD-10-CM | POA: Diagnosis not present

## 2018-03-27 DIAGNOSIS — I129 Hypertensive chronic kidney disease with stage 1 through stage 4 chronic kidney disease, or unspecified chronic kidney disease: Secondary | ICD-10-CM | POA: Diagnosis not present

## 2018-03-27 DIAGNOSIS — I255 Ischemic cardiomyopathy: Secondary | ICD-10-CM | POA: Diagnosis not present

## 2018-03-27 DIAGNOSIS — E1122 Type 2 diabetes mellitus with diabetic chronic kidney disease: Secondary | ICD-10-CM | POA: Diagnosis not present

## 2018-03-27 DIAGNOSIS — J690 Pneumonitis due to inhalation of food and vomit: Secondary | ICD-10-CM | POA: Diagnosis not present

## 2018-03-28 DIAGNOSIS — E1122 Type 2 diabetes mellitus with diabetic chronic kidney disease: Secondary | ICD-10-CM | POA: Diagnosis not present

## 2018-03-28 DIAGNOSIS — B3741 Candidal cystitis and urethritis: Secondary | ICD-10-CM | POA: Diagnosis not present

## 2018-03-28 DIAGNOSIS — I255 Ischemic cardiomyopathy: Secondary | ICD-10-CM | POA: Diagnosis not present

## 2018-03-28 DIAGNOSIS — N390 Urinary tract infection, site not specified: Secondary | ICD-10-CM | POA: Diagnosis not present

## 2018-03-28 DIAGNOSIS — J690 Pneumonitis due to inhalation of food and vomit: Secondary | ICD-10-CM | POA: Diagnosis not present

## 2018-03-28 DIAGNOSIS — I129 Hypertensive chronic kidney disease with stage 1 through stage 4 chronic kidney disease, or unspecified chronic kidney disease: Secondary | ICD-10-CM | POA: Diagnosis not present

## 2018-03-28 DIAGNOSIS — R338 Other retention of urine: Secondary | ICD-10-CM | POA: Diagnosis not present

## 2018-03-28 DIAGNOSIS — F0390 Unspecified dementia without behavioral disturbance: Secondary | ICD-10-CM | POA: Diagnosis not present

## 2018-03-30 DIAGNOSIS — Y846 Urinary catheterization as the cause of abnormal reaction of the patient, or of later complication, without mention of misadventure at the time of the procedure: Secondary | ICD-10-CM | POA: Diagnosis not present

## 2018-03-30 DIAGNOSIS — I959 Hypotension, unspecified: Secondary | ICD-10-CM | POA: Diagnosis not present

## 2018-03-30 DIAGNOSIS — T83511A Infection and inflammatory reaction due to indwelling urethral catheter, initial encounter: Secondary | ICD-10-CM | POA: Diagnosis not present

## 2018-03-30 DIAGNOSIS — B379 Candidiasis, unspecified: Secondary | ICD-10-CM | POA: Diagnosis not present

## 2018-03-30 DIAGNOSIS — R739 Hyperglycemia, unspecified: Secondary | ICD-10-CM | POA: Diagnosis not present

## 2018-04-02 DIAGNOSIS — J690 Pneumonitis due to inhalation of food and vomit: Secondary | ICD-10-CM | POA: Diagnosis not present

## 2018-04-02 DIAGNOSIS — N390 Urinary tract infection, site not specified: Secondary | ICD-10-CM | POA: Diagnosis not present

## 2018-04-02 DIAGNOSIS — I129 Hypertensive chronic kidney disease with stage 1 through stage 4 chronic kidney disease, or unspecified chronic kidney disease: Secondary | ICD-10-CM | POA: Diagnosis not present

## 2018-04-02 DIAGNOSIS — E1122 Type 2 diabetes mellitus with diabetic chronic kidney disease: Secondary | ICD-10-CM | POA: Diagnosis not present

## 2018-04-02 DIAGNOSIS — F0390 Unspecified dementia without behavioral disturbance: Secondary | ICD-10-CM | POA: Diagnosis not present

## 2018-04-02 DIAGNOSIS — I255 Ischemic cardiomyopathy: Secondary | ICD-10-CM | POA: Diagnosis not present

## 2018-04-03 ENCOUNTER — Other Ambulatory Visit: Payer: Self-pay

## 2018-04-03 ENCOUNTER — Other Ambulatory Visit: Payer: Self-pay | Admitting: Urology

## 2018-04-03 ENCOUNTER — Ambulatory Visit: Payer: Self-pay

## 2018-04-03 DIAGNOSIS — R338 Other retention of urine: Secondary | ICD-10-CM | POA: Diagnosis not present

## 2018-04-03 NOTE — Patient Outreach (Signed)
[facility] Care Management  04/03/2018  JABRI BLANCETT 1928/10/16 [id number]    Outreach call placed. Spoke with Mrs. Wilkie Aye. Primary caregiver ([name]) was out at the time of the call. Mrs. Klecker reported that Mr. Stepanek was doing well but requested that RNCM place follow up call to [name] for further information regarding updates and medication changes.    PLAN Will follow up with primary caregiver on next week.   [place] [phone]

## 2018-04-04 DIAGNOSIS — E1122 Type 2 diabetes mellitus with diabetic chronic kidney disease: Secondary | ICD-10-CM | POA: Diagnosis not present

## 2018-04-04 DIAGNOSIS — F0390 Unspecified dementia without behavioral disturbance: Secondary | ICD-10-CM | POA: Diagnosis not present

## 2018-04-04 DIAGNOSIS — J690 Pneumonitis due to inhalation of food and vomit: Secondary | ICD-10-CM | POA: Diagnosis not present

## 2018-04-04 DIAGNOSIS — I129 Hypertensive chronic kidney disease with stage 1 through stage 4 chronic kidney disease, or unspecified chronic kidney disease: Secondary | ICD-10-CM | POA: Diagnosis not present

## 2018-04-04 DIAGNOSIS — I255 Ischemic cardiomyopathy: Secondary | ICD-10-CM | POA: Diagnosis not present

## 2018-04-04 DIAGNOSIS — N390 Urinary tract infection, site not specified: Secondary | ICD-10-CM | POA: Diagnosis not present

## 2018-04-08 DIAGNOSIS — E119 Type 2 diabetes mellitus without complications: Secondary | ICD-10-CM | POA: Diagnosis not present

## 2018-04-08 DIAGNOSIS — E063 Autoimmune thyroiditis: Secondary | ICD-10-CM | POA: Diagnosis not present

## 2018-04-08 DIAGNOSIS — Z0001 Encounter for general adult medical examination with abnormal findings: Secondary | ICD-10-CM | POA: Diagnosis not present

## 2018-04-08 DIAGNOSIS — F0390 Unspecified dementia without behavioral disturbance: Secondary | ICD-10-CM | POA: Diagnosis not present

## 2018-04-08 DIAGNOSIS — I10 Essential (primary) hypertension: Secondary | ICD-10-CM | POA: Diagnosis not present

## 2018-04-08 DIAGNOSIS — N183 Chronic kidney disease, stage 3 (moderate): Secondary | ICD-10-CM | POA: Diagnosis not present

## 2018-04-08 DIAGNOSIS — Z1389 Encounter for screening for other disorder: Secondary | ICD-10-CM | POA: Diagnosis not present

## 2018-04-08 DIAGNOSIS — G2581 Restless legs syndrome: Secondary | ICD-10-CM | POA: Diagnosis not present

## 2018-04-08 DIAGNOSIS — E782 Mixed hyperlipidemia: Secondary | ICD-10-CM | POA: Diagnosis not present

## 2018-04-08 DIAGNOSIS — D485 Neoplasm of uncertain behavior of skin: Secondary | ICD-10-CM | POA: Diagnosis not present

## 2018-04-08 DIAGNOSIS — Z681 Body mass index (BMI) 19 or less, adult: Secondary | ICD-10-CM | POA: Diagnosis not present

## 2018-04-09 DIAGNOSIS — I129 Hypertensive chronic kidney disease with stage 1 through stage 4 chronic kidney disease, or unspecified chronic kidney disease: Secondary | ICD-10-CM | POA: Diagnosis not present

## 2018-04-09 DIAGNOSIS — E1122 Type 2 diabetes mellitus with diabetic chronic kidney disease: Secondary | ICD-10-CM | POA: Diagnosis not present

## 2018-04-09 DIAGNOSIS — F0390 Unspecified dementia without behavioral disturbance: Secondary | ICD-10-CM | POA: Diagnosis not present

## 2018-04-09 DIAGNOSIS — I255 Ischemic cardiomyopathy: Secondary | ICD-10-CM | POA: Diagnosis not present

## 2018-04-09 DIAGNOSIS — N390 Urinary tract infection, site not specified: Secondary | ICD-10-CM | POA: Diagnosis not present

## 2018-04-09 DIAGNOSIS — J690 Pneumonitis due to inhalation of food and vomit: Secondary | ICD-10-CM | POA: Diagnosis not present

## 2018-04-10 ENCOUNTER — Ambulatory Visit: Payer: Self-pay

## 2018-04-10 ENCOUNTER — Ambulatory Visit: Payer: Medicare Other

## 2018-04-11 ENCOUNTER — Encounter (HOSPITAL_COMMUNITY): Payer: Self-pay

## 2018-04-11 DIAGNOSIS — J690 Pneumonitis due to inhalation of food and vomit: Secondary | ICD-10-CM | POA: Diagnosis not present

## 2018-04-11 DIAGNOSIS — I129 Hypertensive chronic kidney disease with stage 1 through stage 4 chronic kidney disease, or unspecified chronic kidney disease: Secondary | ICD-10-CM | POA: Diagnosis not present

## 2018-04-11 DIAGNOSIS — N390 Urinary tract infection, site not specified: Secondary | ICD-10-CM | POA: Diagnosis not present

## 2018-04-11 DIAGNOSIS — F0390 Unspecified dementia without behavioral disturbance: Secondary | ICD-10-CM | POA: Diagnosis not present

## 2018-04-11 DIAGNOSIS — E1122 Type 2 diabetes mellitus with diabetic chronic kidney disease: Secondary | ICD-10-CM | POA: Diagnosis not present

## 2018-04-11 DIAGNOSIS — I255 Ischemic cardiomyopathy: Secondary | ICD-10-CM | POA: Diagnosis not present

## 2018-04-12 ENCOUNTER — Other Ambulatory Visit: Payer: Self-pay

## 2018-04-12 ENCOUNTER — Encounter (HOSPITAL_COMMUNITY): Payer: Self-pay

## 2018-04-12 ENCOUNTER — Encounter (HOSPITAL_COMMUNITY)
Admission: RE | Admit: 2018-04-12 | Discharge: 2018-04-12 | Disposition: A | Discharge status: Home / Self Care | Payer: Medicare Other | Source: Ambulatory Visit | Attending: Urology | Admitting: Urology

## 2018-04-12 ENCOUNTER — Telehealth: Payer: Self-pay | Admitting: Internal Medicine

## 2018-04-12 DIAGNOSIS — Z01812 Encounter for preprocedural laboratory examination: Secondary | ICD-10-CM | POA: Insufficient documentation

## 2018-04-12 HISTORY — DX: Chronic systolic (congestive) heart failure: I50.22

## 2018-04-12 HISTORY — DX: Permanent atrial fibrillation: I48.21

## 2018-04-12 HISTORY — DX: Type 2 diabetes mellitus without complications: E11.9

## 2018-04-12 HISTORY — DX: Ischemic cardiomyopathy: I25.5

## 2018-04-12 HISTORY — DX: Retention of urine, unspecified: R33.9

## 2018-04-12 HISTORY — DX: Dependence on wheelchair: Z99.3

## 2018-04-12 HISTORY — DX: Need for assistance with personal care: Z74.1

## 2018-04-12 HISTORY — DX: Presence of other specified devices: Z97.8

## 2018-04-12 HISTORY — DX: Presence of aortocoronary bypass graft: Z95.1

## 2018-04-12 HISTORY — DX: Presence of cardiac pacemaker: Z95.0

## 2018-04-12 HISTORY — DX: Presence of urogenital implants: Z96.0

## 2018-04-12 LAB — GLUCOSE, CAPILLARY: Glucose-Capillary: 87 mg/dL (ref 70–99)

## 2018-04-12 NOTE — Patient Instructions (Addendum)
Neil [name]  1929-04-14    Your procedure is scheduled on:   04-16-2018  Report to [facility] Main  Entrance,  Report to admitting at  5:30 AM    Call this number if you have problems the morning of surgery [phone]     Remember: Do not eat food or drink liquids :After Midnight.                      BRUSH YOUR TEETH MORNING OF SURGERY AND RINSE YOUR MOUTH OUT, NO CHEWING GUM CANDY OR MINTS.     Take these medicines the morning of surgery with A SIP OF WATER:   NONE                                           You may not have any metal on your body including hair pins and              piercings                Do not wear jewelry, make-up, lotions, powders or perfumes, deodorant              Men may shave face and neck.   Do not bring valuables to the hospital. Neil [name].  Contacts, dentures or bridgework may not be worn into surgery.  Leave suitcase in the car. After surgery it may be brought to your room.     Patients discharged the day of surgery will not be allowed to drive home.  Name and phone number of your driver:  Daughter-- Neil [name]    #[phone]  Special Instructions: N/A              Please read over the following fact sheets you were given: _____________________________________________________________________             [facility] - Preparing for Surgery Before surgery, you can play an important role.  Because skin is not sterile, your skin needs to be as free of germs as possible.  You can reduce the number of germs on your skin by washing with CHG (chlorahexidine gluconate) soap before surgery.  CHG is an antiseptic cleaner which kills germs and bonds with the skin to continue killing germs even after washing. Please DO NOT use if you have an allergy to CHG or antibacterial soaps.  If your skin becomes reddened/irritated stop using the CHG and inform your nurse when you  arrive at Short Stay. Do not shave (including legs and underarms) for at least 48 hours prior to the first CHG shower.  You may shave your face/neck. Please follow these instructions carefully:  1.  Shower with CHG Soap the night before surgery and the  morning of Surgery.  2.  If you choose to wash your hair, wash your hair first as usual with your  normal  shampoo.  3.  After you shampoo, rinse your hair and body thoroughly to remove the  shampoo.                            4.  Use CHG as you  would any other liquid soap.  You can apply chg directly  to the skin and wash                       Gently with a scrungie or clean washcloth.  5.  Apply the CHG Soap to your body ONLY FROM THE NECK DOWN.   Do not use on face/ open                           Wound or open sores. Avoid contact with eyes, ears mouth and genitals (private parts).                       Wash face,  Genitals (private parts) with your normal soap.             6.  Wash thoroughly, paying special attention to the area where your surgery  will be performed.  7.  Thoroughly rinse your body with warm water from the neck down.  8.  DO NOT shower/wash with your normal soap after using and rinsing off  the CHG Soap.             9.  Pat yourself dry with a clean towel.            10.  Wear clean pajamas.            11.  Place clean sheets on your bed the night of your first shower and do not  sleep with pets. Day of Surgery : Do not apply any lotions/deodorants the morning of surgery.  Please wear clean clothes to the hospital/surgery center.  FAILURE TO FOLLOW THESE INSTRUCTIONS MAY RESULT IN THE CANCELLATION OF YOUR SURGERY PATIENT SIGNATURE_________________________________  NURSE SIGNATURE__________________________________  ________________________________________________________________________

## 2018-04-12 NOTE — Telephone Encounter (Signed)
New Message        Patient's daughter is calling today about her father's B/P which is running low, patient blood sugar is fluctuating. B/P on the bottom is running in the low 40's. Patient's daughter is calling to see if you need to change the medication, if so use [facility] on ([address] in [place]).

## 2018-04-12 NOTE — Patient Outreach (Signed)
[facility]) Care Management  04/12/2018  GREYSIN MEDLEN 06-17-1929 [phone]    Outreach call unsuccessful. Left HIPAA compliant voice message requesting a return call.   PLAN Will follow up within 3-4 business days.   [place] [phone]

## 2018-04-12 NOTE — Progress Notes (Signed)
[employer] representative came today and did a pacemaker monitor check, printed results placed in chart.  Per pt daughter pt had A1c done Monday 04-08-2018 at pt's pcp.  Called dr fusco office and request result to be faxed , received and placed in chart.  CBCdiff and CMET results dated 03-21-2018 in eipc. EKG dated 02-26-2018 in epic. Abd/CXR result dated 03-03-2018 in epic.  Per pt daughter was given instructions from dr Jeffie Pollock office to stop Pradaxa 5 days prior to surgery. Pt daughter last day taken 04-11-2018.

## 2018-04-12 NOTE — Telephone Encounter (Signed)
Spoke with pt's daughter received verbal permission from pt as DPR not filled out  and conversation was hard to follow daughter spoke about sugar level ,b/p and weight loss. Per daughter pt is no longer on carvedilol one B/p reading was 88/43 from last month yesterday b/p was 115/65 and heart rate 85 .Daughter has changed pt's diet and since b/p has dropped .Informed daughter to keep appt on 04-24-18 as Dr Caryl Comes may need to adjust meds .Will forward to Dr Caryl Comes for review .Adonis Housekeeper

## 2018-04-15 DIAGNOSIS — D485 Neoplasm of uncertain behavior of skin: Secondary | ICD-10-CM | POA: Diagnosis not present

## 2018-04-15 DIAGNOSIS — C44319 Basal cell carcinoma of skin of other parts of face: Secondary | ICD-10-CM | POA: Diagnosis not present

## 2018-04-15 DIAGNOSIS — Z85820 Personal history of malignant melanoma of skin: Secondary | ICD-10-CM | POA: Diagnosis not present

## 2018-04-15 DIAGNOSIS — Z1283 Encounter for screening for malignant neoplasm of skin: Secondary | ICD-10-CM | POA: Diagnosis not present

## 2018-04-15 DIAGNOSIS — Z08 Encounter for follow-up examination after completed treatment for malignant neoplasm: Secondary | ICD-10-CM | POA: Diagnosis not present

## 2018-04-15 DIAGNOSIS — D225 Melanocytic nevi of trunk: Secondary | ICD-10-CM | POA: Diagnosis not present

## 2018-04-15 NOTE — Progress Notes (Signed)
Reviewed pt chart w/ dr Smith Robert mda, face to face.  Per dr Smith Robert mda, stated pt will need to be assessed dos due to his multiple cardiac issues, may need cxr dos.  Dr Smith Robert , plan light sedation.

## 2018-04-15 NOTE — H&P (Signed)
CC: I have urinary retention.  HPI: Neil [name] is a 84 year-old male established patient who is here for urinary retention.    04/03/18: Neil [name] returns today for urodynamics. He has had to have the foley changed q2wks and continues to want to pull it out.   03/28/18: Neil [name] went to the ER on 9/11 for MS changes. he had some pyuria and was started on bactrim and was discharged. He will finish the bactrim tonight but his culture grew Yeast and he has not been treated for that. He is doing better and is sleeping a lot. He is not having problems with the foley but they are having to glove his hands at night to prevent traumatic removal.   GU Hx: Neil [name] is an 84 yo WM who was recently hospitalized for pneumonia and was admitted on 8/19 with discharge on 8/21. He had e. coli on blood cultures but mx species on the urine culture and his UA shows mild pyuria. He was seen in the ER on 8/25 with a fecal impaction and a CT showed massive bladder dilation and bilateral hydro. He was sent home with condom catheter but a home health nurse placed a foley that night. he was found to have an initial PVR of 1.5 liters. he is on no medications for BOO. He has had a reduced output over the past few days. He has had poor intake. He had been managed with a diaper prior to the hospitalized. He was voiding incontinently but with large volumes. He has had no prior history of GU surgery or UTI's but we treated him for stones in the past. He didn't require intervention. He is pulling at the foley and they have to keep mittens on at night.     ALLERGIES: Cephalexin penicillin    MEDICATIONS: Pradaxa 75 mg capsule  Atorvastatin Calcium 40 mg tablet  Ferrous Sulfate  Furosemide 20 mg tablet  Glipizide Er 5 mg tablet, extended release 24 hr  Pantoprazole Sodium  Ropinirole Hcl 0.25 mg tablet  Sulfamethoxazole-Trimethoprim 800 mg-160 mg tablet     GU PSH: No GU PSH    NON-GU PSH: CABG (coronary artery  bypass grafting) Pacemaker placement    GU PMH: Candidal cystitis and urethritis - 03/28/2018 Urinary Retention, He was in the ER on 9/11 and started on Bactrim but his culture is growing yeast. he is for UDS on 04/03/18 and I will give him diflucan 100mg  po to take the day before and day of the procedure. - 03/28/2018, He had retention with bilateral hydro and AKI associated with a fecal impaction. He is hypotensive so alpha blockers are not an option and the prostate feels small so I don't think finasteride would be helpful. I am going to have him return in 2-3 weeks for urodynamics to better assess his residual bladder function and prospects for becoming catheter free. , - 03/12/2018 Chronic kidney disease stage 3 (GFR 30-60), His Cr was up to 1.45 when he was in retention and he has decreased UOP over the last few days. he is seeing Neil [name] this afternoon and I asked that they get Neil [name] to recheck his labs. - 03/12/2018      PMH Notes: dementia     NON-GU PMH: Atrial Fibrillation Coronary Artery Disease Diabetes Type 2 Encounter for general adult medical examination without abnormal findings, Encounter for preventive health examination GERD Hypercholesterolemia Hypertension    FAMILY HISTORY: Diabetes - Mother   SOCIAL HISTORY: Marital Status:  Married Preferred Language: English; Race: White Current Smoking Status: Patient has never smoked.   Tobacco Use Assessment Completed: Used Tobacco in last 30 days? Drinks 4+ caffeinated drinks per day.     Notes: 1 son   REVIEW OF SYSTEMS:    GU Review Male:   Patient denies frequent urination, hard to postpone urination, burning/ pain with urination, get up at night to urinate, leakage of urine, stream starts and stops, trouble starting your stream, have to strain to urinate , erection problems, and penile pain.  Gastrointestinal (Upper):   Patient denies nausea, vomiting, and indigestion/ heartburn.  Gastrointestinal (Lower):   Patient  denies diarrhea and constipation.  Constitutional:   Patient denies fever, night sweats, weight loss, and fatigue.  Skin:   Patient denies skin rash/ lesion and itching.  Eyes:   Patient denies blurred vision and double vision.  Ears/ Nose/ Throat:   Patient denies sore throat and sinus problems.  Hematologic/Lymphatic:   Patient denies swollen glands and easy bruising.  Cardiovascular:   Patient denies leg swelling and chest pains.  Respiratory:   Patient denies cough and shortness of breath.  Endocrine:   Patient denies excessive thirst.  Musculoskeletal:   Patient denies back pain and joint pain.  Neurological:   Patient denies headaches and dizziness.  Psychologic:   Patient denies depression and anxiety.   VITAL SIGNS: None   PAST DATA REVIEWED:  Source Of History:  Patient   03/31/04  PSA  Total PSA 0.16     PROCEDURES:          Urodynamics - 51600, 98338, 25053, 97673, 41937 URODYNAMICS STUDY  Test Indication: Retention The procedure's risks, benefits and infection risk were discussed with the patient.  PRE UROFLOW & CATHETERIZATION Procedure: Pre Uroflow Study The patient did not void. Arrived with a 16 fr foley. A Urodynamic catheter was inserted. I had trouble with tracing of the Pabd line and had to keep adjusting it back to baseline. He was also having multiple rectal spasms that interfered with tracing.  CYSTOMETRY/CYSTOMETROGRAM The bladder was filled with room temperature water at a rate of less than 50 cc per minute. Injection of contrast was performed for the cystometrogram. Max capacity was approx. 650 mls. He was not able to communicate his bladder sensation to me.   The bladder was unstable. First unstable contraction occurred at 260 mls. It is difficult to gauge accurate detrusor pressures due to his rectal spasms, but the contractions were not that strong and were not well sustained.  LEAK POINT PRESSURE LPPs were assessed with pt in a seated  position. X-ray was used to assess for SUI.  No leakage was noted with pressures of 45 cmH20.  PRESSURE FLOW STUDY No voluntary contraction was generated. He did leak a total of 61 mls during instability. His residual was approx. 590 mls.  ELECTROMYOGRAM Activity was measured by surface electrodes Intermittent increase in EMG leads was noted mostly during rectal spasms as expected.  FLUOROSCOPY and VCUG No reflux was seen.  POST PROCEDURE ANTIBIOTICS: No antibiotics were given post UDS. Urine was sent for culture. His family was advised to watch for s/s of a break through UTI and to call our office or go to the ED if Neil [name] develops any of them.   NURSE IMPRESSION Communication was difficult due to Neil [name]'s dementia. He held a max capacity of approx. 650 mls. There may have been a slight loss of compliance, but it was not impressive. He was  not able to tell me anything about his bladder sensation. There was positive low amplitude with leakage. He c/o burning in his penis during the contractions. He was having multiple rectal spasms during the study which effected tracing of accurate pressures, but his contractions were weak and not well sustained. No voluntary contraction was generated. PVR was approx. 590 mls. No reflux was seen. Dr. Jeffie [name] came in and performed a cysto. A 16 fr foley was inserted prior to the pt leaving the office.        Flexible Cystoscopy - 52000  Risks, benefits, and some of the potential complications of the procedure were discussed. He is uncircumcised and has erythema of the glans. he is on diflucan for yeast in the urine for the procedure today.    Meatus:  Normal size. Normal location. Normal condition.  Urethra:  No strictures.  External Sphincter:  Normal.  Verumontanum:  Normal.  Prostate:  Non-obstructing. No hyperplasia. very short and wide open.  Bladder Neck:  Non-obstructing.  Ureteral Orifices:  Normal location. Normal size. Normal shape.  Effluxed clear urine.  Bladder:  No trabeculation. No tumors. Normal mucosa. No stones. Smooth walled and large.       A 59fr foley was reinserted after the cystoscopy.  The procedure was well tolerated and there were no complications.   ASSESSMENT:      ICD-10 Details  1 GU:   Urinary Retention - R33.8 He has very poor detrusor function and no outlet obstruction. I think the most appropriate option at this time is to place a suprapubic tube. I reviewed the risks in detail and will get him set up in the near future.   2   Areflexic bladder - N31.2      PLAN:           Schedule Return Visit/Planned Activity: ASAP - Schedule Surgery          Document Letter(s):  Created for Patient: Clinical Summary         Notes:   CC: Dr. Redmond School.         Next Appointment:      Next Appointment: 04/16/2018 07:30 AM    Appointment Type: Surgery     Location: [facility] [phone]    Provider: Irine Seal, M.D.    Reason for Visit: OP WL SP TUBE PLACEMENT

## 2018-04-16 ENCOUNTER — Ambulatory Visit (HOSPITAL_COMMUNITY): Payer: Medicare Other | Admitting: Anesthesiology

## 2018-04-16 ENCOUNTER — Ambulatory Visit (HOSPITAL_COMMUNITY)
Admission: RE | Admit: 2018-04-16 | Discharge: 2018-04-16 | Disposition: A | Discharge status: Home / Self Care | Payer: Medicare Other | Source: Ambulatory Visit | Attending: Urology | Admitting: Urology

## 2018-04-16 ENCOUNTER — Encounter (HOSPITAL_COMMUNITY): Payer: Self-pay

## 2018-04-16 ENCOUNTER — Encounter (HOSPITAL_COMMUNITY)
Admission: RE | Disposition: A | Discharge status: Home / Self Care | Payer: Self-pay | Source: Ambulatory Visit | Attending: Urology

## 2018-04-16 DIAGNOSIS — Z833 Family history of diabetes mellitus: Secondary | ICD-10-CM | POA: Diagnosis not present

## 2018-04-16 DIAGNOSIS — Z880 Allergy status to penicillin: Secondary | ICD-10-CM | POA: Insufficient documentation

## 2018-04-16 DIAGNOSIS — Z79899 Other long term (current) drug therapy: Secondary | ICD-10-CM | POA: Diagnosis not present

## 2018-04-16 DIAGNOSIS — E7800 Pure hypercholesterolemia, unspecified: Secondary | ICD-10-CM | POA: Diagnosis not present

## 2018-04-16 DIAGNOSIS — R339 Retention of urine, unspecified: Secondary | ICD-10-CM | POA: Diagnosis not present

## 2018-04-16 DIAGNOSIS — N183 Chronic kidney disease, stage 3 (moderate): Secondary | ICD-10-CM | POA: Diagnosis not present

## 2018-04-16 DIAGNOSIS — Z7984 Long term (current) use of oral hypoglycemic drugs: Secondary | ICD-10-CM | POA: Diagnosis not present

## 2018-04-16 DIAGNOSIS — K219 Gastro-esophageal reflux disease without esophagitis: Secondary | ICD-10-CM | POA: Insufficient documentation

## 2018-04-16 DIAGNOSIS — N312 Flaccid neuropathic bladder, not elsewhere classified: Secondary | ICD-10-CM | POA: Insufficient documentation

## 2018-04-16 DIAGNOSIS — I129 Hypertensive chronic kidney disease with stage 1 through stage 4 chronic kidney disease, or unspecified chronic kidney disease: Secondary | ICD-10-CM | POA: Insufficient documentation

## 2018-04-16 DIAGNOSIS — I2510 Atherosclerotic heart disease of native coronary artery without angina pectoris: Secondary | ICD-10-CM | POA: Diagnosis not present

## 2018-04-16 DIAGNOSIS — E1122 Type 2 diabetes mellitus with diabetic chronic kidney disease: Secondary | ICD-10-CM | POA: Insufficient documentation

## 2018-04-16 DIAGNOSIS — Z951 Presence of aortocoronary bypass graft: Secondary | ICD-10-CM | POA: Diagnosis not present

## 2018-04-16 DIAGNOSIS — Z7901 Long term (current) use of anticoagulants: Secondary | ICD-10-CM | POA: Insufficient documentation

## 2018-04-16 DIAGNOSIS — Z950 Presence of cardiac pacemaker: Secondary | ICD-10-CM | POA: Diagnosis not present

## 2018-04-16 DIAGNOSIS — Z881 Allergy status to other antibiotic agents status: Secondary | ICD-10-CM | POA: Diagnosis not present

## 2018-04-16 DIAGNOSIS — R32 Unspecified urinary incontinence: Secondary | ICD-10-CM | POA: Insufficient documentation

## 2018-04-16 DIAGNOSIS — R338 Other retention of urine: Secondary | ICD-10-CM | POA: Diagnosis not present

## 2018-04-16 DIAGNOSIS — I4891 Unspecified atrial fibrillation: Secondary | ICD-10-CM | POA: Diagnosis not present

## 2018-04-16 DIAGNOSIS — F0390 Unspecified dementia without behavioral disturbance: Secondary | ICD-10-CM | POA: Diagnosis not present

## 2018-04-16 HISTORY — PX: INSERTION OF SUPRAPUBIC CATHETER: SHX5870

## 2018-04-16 LAB — GLUCOSE, CAPILLARY: GLUCOSE-CAPILLARY: 93 mg/dL (ref 70–99)

## 2018-04-16 SURGERY — INSERTION, SUPRAPUBIC CATHETER
Anesthesia: Monitor Anesthesia Care

## 2018-04-16 MED ORDER — BUPIVACAINE HCL (PF) 0.25 % IJ SOLN
INTRAMUSCULAR | Status: DC | PRN
  Administered 2018-04-16: 10 mL

## 2018-04-16 MED ORDER — LACTATED RINGERS IV SOLN
INTRAVENOUS | Status: DC | PRN
  Administered 2018-04-16: 07:00:00 via INTRAVENOUS

## 2018-04-16 MED ORDER — PROPOFOL 10 MG/ML IV BOLUS
INTRAVENOUS | Status: AC
  Filled 2018-04-16: qty 20

## 2018-04-16 MED ORDER — LIDOCAINE HCL URETHRAL/MUCOSAL 2 % EX GEL
CUTANEOUS | Status: DC | PRN
  Administered 2018-04-16: 1 via URETHRAL

## 2018-04-16 MED ORDER — CIPROFLOXACIN IN D5W 400 MG/200ML IV SOLN
400.0000 mg | INTRAVENOUS | Status: AC
  Administered 2018-04-16: 400 mg via INTRAVENOUS

## 2018-04-16 MED ORDER — SODIUM CHLORIDE 0.9 % IR SOLN
Status: DC | PRN
  Administered 2018-04-16: 3000 mL

## 2018-04-16 MED ORDER — TRAMADOL HCL 50 MG PO TABS: 50.0000 mg | ORAL_TABLET | Freq: Four times a day (QID) | ORAL | 0 refills | Status: DC | PRN

## 2018-04-16 MED ORDER — FENTANYL CITRATE (PF) 100 MCG/2ML IJ SOLN: 25.0000 ug | INTRAMUSCULAR | Status: DC | PRN

## 2018-04-16 MED ORDER — PROPOFOL 10 MG/ML IV BOLUS
INTRAVENOUS | Status: DC | PRN
  Administered 2018-04-16: 20 mg via INTRAVENOUS

## 2018-04-16 MED ORDER — CIPROFLOXACIN IN D5W 400 MG/200ML IV SOLN
INTRAVENOUS | Status: AC
  Filled 2018-04-16: qty 200

## 2018-04-16 MED ORDER — BUPIVACAINE HCL (PF) 0.25 % IJ SOLN
INTRAMUSCULAR | Status: AC
  Filled 2018-04-16: qty 30

## 2018-04-16 MED ORDER — LIDOCAINE HCL URETHRAL/MUCOSAL 2 % EX GEL
CUTANEOUS | Status: AC
  Filled 2018-04-16: qty 5

## 2018-04-16 MED ORDER — PROPOFOL 500 MG/50ML IV EMUL
INTRAVENOUS | Status: DC | PRN
  Administered 2018-04-16: 75 ug/kg/min via INTRAVENOUS

## 2018-04-16 MED ORDER — LIDOCAINE HCL (CARDIAC) PF 100 MG/5ML IV SOSY
PREFILLED_SYRINGE | INTRAVENOUS | Status: AC
  Filled 2018-04-16: qty 5

## 2018-04-16 MED ORDER — ONDANSETRON HCL 4 MG/2ML IJ SOLN: 4.0000 mg | Freq: Once | INTRAMUSCULAR | Status: DC | PRN

## 2018-04-16 SURGICAL SUPPLY — 23 items
BAG URINE DRAINAGE (UROLOGICAL SUPPLIES) ×3 IMPLANT
BAG URINE LEG 500ML (DRAIN) IMPLANT
BLADE SURG 15 STRL LF DISP TIS (BLADE) ×1 IMPLANT
BLADE SURG 15 STRL SS (BLADE) ×2
CATH FOLEY 2WAY SLVR  5CC 20FR (CATHETERS)
CATH FOLEY 2WAY SLVR 30CC 20FR (CATHETERS) ×3 IMPLANT
CATH FOLEY 2WAY SLVR 5CC 20FR (CATHETERS) IMPLANT
CATH URET 5FR 28IN OPEN ENDED (CATHETERS) IMPLANT
ELECT PENCIL ROCKER SW 15FT (MISCELLANEOUS) ×3 IMPLANT
ELECT REM PT RETURN 15FT ADLT (MISCELLANEOUS) ×3 IMPLANT
GAUZE SPONGE 4X4 12PLY STRL (GAUZE/BANDAGES/DRESSINGS) ×3 IMPLANT
GLOVE SURG SS PI 8.0 STRL IVOR (GLOVE) ×3 IMPLANT
GOWN STRL REUS W/TWL XL LVL3 (GOWN DISPOSABLE) ×6 IMPLANT
MANIFOLD NEPTUNE II (INSTRUMENTS) ×3 IMPLANT
NEEDLE HYPO 22GX1.5 SAFETY (NEEDLE) ×3 IMPLANT
NS IRRIG 1000ML POUR BTL (IV SOLUTION) ×3 IMPLANT
PACK CYSTO (CUSTOM PROCEDURE TRAY) ×3 IMPLANT
PLUG CATH AND CAP STER (CATHETERS) IMPLANT
SPONGE DRAIN TRACH 4X4 STRL 2S (GAUZE/BANDAGES/DRESSINGS) ×3 IMPLANT
SUT ETHILON 2 0 PS N (SUTURE) ×3 IMPLANT
TAPE CLOTH SURG 4X10 WHT LF (GAUZE/BANDAGES/DRESSINGS) ×3 IMPLANT
TOWEL OR 17X26 10 PK STRL BLUE (TOWEL DISPOSABLE) ×3 IMPLANT
WATER STERILE IRR 3000ML UROMA (IV SOLUTION) IMPLANT

## 2018-04-16 NOTE — Op Note (Signed)
Procedure: Cystoscopy with suprapubic cystostomy.  Preop diagnosis: Urinary retention with hypotonic bladder.  Postop diagnosis: Same.  Surgeon: Dr. Irine Seal.  Anesthesia: MAC and local.  Drain: 62 French Foley suprapubic tube.  Specimen: None.  EBL: None.  Complications: None.  Indications: Neil [name] is an 83 year old white male with chronic urinary retention with a hypotonic bladder who is been managed with a Foley catheter.  He has dementia and is been pulling at the catheter and it was felt that the suprapubic tube would be a better option.  Procedure: He had been on Diflucan for candiduria for 3 days prior to the procedure and was given Cipro for bacterial prophylaxis.  He was placed on the table in the lithotomy position and fitted with PAS hose and sedation was given as needed.  His lower abdomen was clipped and he was prepped with Betadine solution and draped in usual sterile fashion.  The urethra was instilled with 10 cc of 2% lidocaine jelly and cystoscopy was performed using a 23 [place] scope with 30 degree lens.  Examination revealed a normal urethra.  The external sphincter was intact.  The prostatic urethra short with some lateral lobe hyperplasia but without significant obstruction.  Examination of bladder revealed catheter irritation on the posterior wall and mild trabeculation but no tumors or stones.  Ureteral orifices were unremarkable.  The bladder was left full and the cystoscope was removed.  The anterior abdominal wall in the midline approximately 3 cm superior to the pubis was infiltrated with 10 mL of quarter percent Marcaine.  The curved Lowsley retractor was then passed per urethra and once in the bladder the tip was applied to the anterior bladder wall so it could be palpable.  He was placed in steep Trendelenburg and an incision was made with a knife over the tip of the Lowsley which was then brought through the abdominal wall.  A 20 French 30 cc Foley catheter  was then grasped with the Lowsley and pulled into the bladder.  The balloon was filled with 20 mL of fluid and the Lowsley was released and removed.  Repeat cystoscopy demonstrated the catheter balloon in good position in the dome of the bladder.  An additional 10 mL was placed in the balloon for a total of 30 mL.  The suprapubic tube was secured to the skin using a 2-0 nylon suture.  The catheter was placed to straight drainage.  A dressing was applied.  He was taken down from lithotomy position, his anesthetic was reversed and he was moved to recovery room in stable conditions.

## 2018-04-16 NOTE — Interval H&P Note (Signed)
History and Physical Interval Note:  04/16/2018 7:20 AM  Neil [name]  has presented today for surgery, with the diagnosis of RETENTION  The various methods of treatment have been discussed with the patient and family. After consideration of risks, benefits and other options for treatment, the patient has consented to  Procedure(s): INSERTION OF SUPRAPUBIC CATHETER (N/A) as a surgical intervention .  The patient's history has been reviewed, patient examined, no change in status, stable for surgery.  I have reviewed the patient's chart and labs.  Questions were answered to the patient's satisfaction.     Irine Seal

## 2018-04-16 NOTE — Transfer of Care (Signed)
Immediate Anesthesia Transfer of Care Note  Patient: Neil [name]  Procedure(s) Performed: INSERTION OF SUPRAPUBIC CATHETER (N/A )  Patient Location: PACU  Anesthesia Type:MAC  Level of Consciousness: drowsy  Airway & Oxygen Therapy: Patient Spontanous Breathing and Patient connected to face mask oxygen  Post-op Assessment: Report given to RN and Post -op Vital signs reviewed and stable  Post vital signs: Reviewed and stable  Last Vitals:  Vitals Value Taken Time  BP 111/70 04/16/2018  8:15 AM  Temp    Pulse 77 04/16/2018  8:19 AM  Resp 9 04/16/2018  8:19 AM  SpO2 100 % 04/16/2018  8:19 AM  Vitals shown include unvalidated device data.  Last Pain:  Vitals:   04/16/18 7829  TempSrc: Oral  PainSc:          Complications: No apparent anesthesia complications

## 2018-04-16 NOTE — Discharge Instructions (Signed)
Suprapubic Catheter Home Guide A suprapubic catheter is a rubber tube used to drain urine from the bladder into a collection bag. The catheter is inserted into the bladder through a small opening in the in the lower abdomen, near the center of the body, above the pubic bone (suprapubic area). There is a tiny balloon filled with germ-free (sterile) water on the end of the catheter that is in the bladder. The balloon helps to keep the catheter in place. Your suprapubic catheter may need to be replaced every 4-6 weeks, or as often as recommended by your health care provider. The collection bag must be emptied every day and cleaned every 2-3 days. The collection bag can be put beside your bed at night and attached to your leg during the day. You may have a large collection bag to use at night and a smaller one to use during the day. What are the risks?  Urine flow can become blocked. This can happen if the catheter is not working correctly, or if you have a blood clot in your bladder or in the catheter.  Tissue near the catheter may can become irritated and bleed.  Bacteria may get into your bladder and cause a urinary tract infection. How do I change the catheter? Supplies needed  Two pairs of sterile gloves.  Catheter.  Two syringes.  Sterile water.  Sterile cleaning solution.  Lubricant.  Collection bags. Changing the catheter To replace your catheter, take the following steps: 1. Drink plenty of fluids during the hours before you plan to change the catheter. 2. Wash your hands with soap and water. If soap and water are not available, use hand sanitizer. 3. Lie on your back and put on sterile gloves. 4. Clean the skin around the catheter opening using the sterile cleaning solution. 5. Remove the water from the balloon using a syringe. 6. Slowly remove the catheter. ? Do not pull on the catheter if it seems stuck. ? Call your health care provider immediately if you have difficulty  removing the catheter. 7. Take off the used gloves, and put on a new pair. 8. Put lubricant on the end of the new catheter that will go into your bladder. 9. Gently slide the catheter through the opening in your abdomen and into your bladder. 10. Wait for some urine to start flowing through the catheter. When urine starts to flow through the catheter, use a new syringe to fill the balloon with sterile water. 11. Attach the collection bag to the end of the catheter. Make sure the connection is tight. 12. Remove the gloves and wash your hands with soap and water.  How do I care for my skin around the catheter? Use a clean washcloth and soapy water to clean the skin around your catheter every day. Pat the area dry with a clean towel.  Do not pull on the catheter.  Do not use ointment or lotion on this area unless told by your health care provider.  Check your skin around the catheter every day for signs of infection. Check for: ? Redness, swelling, or pain. ? Fluid or blood. ? Warmth. ? Pus or a bad smell.  How do I clean and empty the collection bag? Clean the collection bag every 2-3 days, or as often as told by your health care provider. To do this, take the following steps:  Wash your hands with soap and water. If soap and water are not available, use hand sanitizer.  Disconnect the bag  from the catheter and immediately attach a new bag to the catheter. °· Empty the used bag completely. °· Clean the used bag using one of the following methods: °? Rinse the used bag with warm water and soap. °? Fill the bag with water and add 1 tsp of vinegar. Let it sit for about 30 minutes, then empty the bag. °· Let the bag dry completely, and put it in a clean plastic bag before storing it. ° °Empty the large collection bag every 8 hours. Empty the small collection bag when it is about ? full. To empty your large or small collection bag, take the following steps: °· Always keep the bag below the level  of the catheter. This keeps urine from flowing backwards into the catheter. °· Hold the bag over the toilet or another container. Turn the valve (spigot) at the bottom of the bag to empty the urine. °? Do not touch the opening of the spigot. °? Do not let the opening touch the toilet or container. °· Close the spigot tightly when the bag is empty. ° °What are some general tips? °· Always wash your hands before and after caring for your catheter and collection bag. Use a mild, fragrance-free soap. If soap and water are not available, use hand sanitizer. °· Clean the catheter with soap and water as often as told by your health care provider. °· Always make sure there are no twists or curls (kinks) in the catheter tube. °· Always make sure there are no leaks in the catheter or collection bag. °· Drink enough fluid to keep your urine clear or pale yellow. °· Do not take baths, swim, or use a hot tub. °When should I seek medical care? °Seek medical care if: °· You leak urine. °· You have redness, swelling, or pain around your catheter opening. °· You have fluid or blood coming from your catheter opening. °· Your catheter opening feels warm to the touch. °· You have pus or a bad smell coming from your catheter opening. °· You have a fever or chills. °· Your urine flow slows down. °· Your urine becomes cloudy or smelly. ° °When should I seek immediate medical care? °Seek immediate medical care if your catheter comes out, or if you have: °· Nausea. °· Back pain. °· Difficulty changing your catheter. °· Blood in your urine. °· No urine flow for 1 hour. ° °This information is not intended to replace advice given to you by your health care provider. Make sure you discuss any questions you have with your health care provider. °Document Released: 03/14/2011 Document Revised: 02/23/2016 Document Reviewed: 03/09/2015 °Elsevier Interactive Patient Education © 2018 [employer] ° °

## 2018-04-16 NOTE — Anesthesia Preprocedure Evaluation (Signed)
Anesthesia Evaluation  Patient identified by MRN, date of birth, ID band Patient confused    Reviewed: Allergy & Precautions, NPO status , Patient's Chart, lab work & pertinent test results  Airway Mallampati: II  TM Distance: >3 FB Neck ROM: Full    Dental  (+) Teeth Intact, Dental Advisory Given   Pulmonary     + decreased breath sounds      Cardiovascular hypertension,  Rhythm:Regular Rate:Normal     Neuro/Psych    GI/Hepatic   Endo/Other  diabetes  Renal/GU      Musculoskeletal   Abdominal   Peds  Hematology   Anesthesia Other Findings   Reproductive/Obstetrics                             Anesthesia Physical Anesthesia Plan  ASA: III  Anesthesia Plan: MAC   Post-op Pain Management:    Induction: Intravenous  PONV Risk Score and Plan: Ondansetron  Airway Management Planned: Natural Airway and Simple Face Mask  Additional Equipment:   Intra-op Plan:   Post-operative Plan:   Informed Consent: I have reviewed the patients History and Physical, chart, labs and discussed the procedure including the risks, benefits and alternatives for the proposed anesthesia with the patient or authorized representative who has indicated his/her understanding and acceptance.   Dental advisory given  Plan Discussed with: CRNA and Anesthesiologist  Anesthesia Plan Comments:         Anesthesia Quick Evaluation

## 2018-04-17 ENCOUNTER — Other Ambulatory Visit: Payer: Self-pay

## 2018-04-17 ENCOUNTER — Encounter (HOSPITAL_COMMUNITY): Payer: Self-pay | Admitting: Urology

## 2018-04-17 DIAGNOSIS — F0390 Unspecified dementia without behavioral disturbance: Secondary | ICD-10-CM | POA: Diagnosis not present

## 2018-04-17 DIAGNOSIS — I255 Ischemic cardiomyopathy: Secondary | ICD-10-CM | POA: Diagnosis not present

## 2018-04-17 DIAGNOSIS — J690 Pneumonitis due to inhalation of food and vomit: Secondary | ICD-10-CM | POA: Diagnosis not present

## 2018-04-17 DIAGNOSIS — E1122 Type 2 diabetes mellitus with diabetic chronic kidney disease: Secondary | ICD-10-CM | POA: Diagnosis not present

## 2018-04-17 DIAGNOSIS — N390 Urinary tract infection, site not specified: Secondary | ICD-10-CM | POA: Diagnosis not present

## 2018-04-17 DIAGNOSIS — I129 Hypertensive chronic kidney disease with stage 1 through stage 4 chronic kidney disease, or unspecified chronic kidney disease: Secondary | ICD-10-CM | POA: Diagnosis not present

## 2018-04-17 NOTE — Anesthesia Postprocedure Evaluation (Signed)
Anesthesia Post Note  Patient: Neil [name]  Procedure(s) Performed: INSERTION OF SUPRAPUBIC CATHETER (N/A )     Patient location during evaluation: PACU Anesthesia Type: MAC Level of consciousness: awake and alert Pain management: pain level controlled Vital Signs Assessment: post-procedure vital signs reviewed and stable Respiratory status: spontaneous breathing, nonlabored ventilation, respiratory function stable and patient connected to nasal cannula oxygen Cardiovascular status: stable and blood pressure returned to baseline Postop Assessment: no apparent nausea or vomiting Anesthetic complications: no    Last Vitals:  Vitals:   04/16/18 0945 04/16/18 1015  BP: 128/73 133/75  Pulse: 76 77  Resp: 20 (!) 22  Temp: (!) 36.4 C 36.4 C  SpO2: 100% 100%    Last Pain:  Vitals:   04/16/18 1015  TempSrc:   PainSc: 0-No pain                 Sulema Braid COKER

## 2018-04-17 NOTE — Addendum Note (Signed)
Addendum  created 04/17/18 0508 by Roberts Gaudy, MD   Sign clinical note

## 2018-04-17 NOTE — Patient Outreach (Signed)
[place] [facility]) Care Management  04/17/2018  BREK REECE 22-Nov-1928 [id number]   Outreach call placed to discuss Mr. Garside's status post suprapubic catheter placement. [name] reported that he was doing well and confirmed engagement with Home Health services. Stated he wears his leg bag during the day and completed PT prior to the call. Reported good urine output with scant sediment noted in the tubing. Noted improvement with his short-term memory. Reported chopping food, adding thickeners to fluid and following precautions to prevent aspiration. Stated that Mr. Mcbrearty seemed to be improving but expressed concerns that the family's focus has now shifted to Mrs. Lute due to her declining ability to ambulate. Reported that everyone was very busy caring for the couple but not overwhelmed. Denied need for additional outreach from [place] but agreed to contact [facility] if additional referrals are needed. No immediate concerns.  [facility] CM Care Plan Problem One     Most Recent Value  Care Plan Problem One  High Risk for falls due to mobility and cognitive impairment  Role Documenting the Problem One  Care Management [place] for Problem One  Active  [facility] Long Term Goal   Patient will not sustain any injuries related to falls over the next 90 days.  [facility] Long Term Goal Start Date  01/07/18  [facility] Long Term Goal Met Date  04/17/18  [facility] CM Short Term Goal #1   Over the next 30 days family members and caretakers will utilize assistive devices(wheelchair, walker) when assisting patient to ambulate inside and outside of the home.   [facility] CM Short Term Goal #1 Start Date  01/07/18  [facility] CM Short Term Goal #1 Met Date  03/19/18  [facility] CM Short Term Goal #2   Over the next 30 days caregivers will ensure bed is in the lowest position and bedrails are up when patient is in bed.  [facility] CM Short Term Goal #2 Start Date  01/07/18  [facility] CM Short Term Goal #2 Met Date  03/19/18    [facility] CM Care Plan  Problem Two     Most Recent Value  Care Plan Problem Two  Risk for readmission  Role Documenting the Problem Two  Care Management Coordinator  Care Plan for Problem Two  Active  Interventions for Problem Two Long Term Goal   Discussed precautions post catheter placement. Reviewed s/sx of infection and indications for seeking medical attention. Encouraged to continue practicing safety precautions.   [facility] Long Term Goal  Over the next 90 days patient will not be hospitalized due to chronic disease complications.  [facility] Long Term Goal Start Date  01/07/18  [facility] CM Short Term Goal #1   Over the next 30 days patient will take medications as prescribed.  [facility] CM Short Term Goal #1 Start Date  01/07/18  [facility] CM Short Term Goal #1 Met Date   03/19/18  [facility] CM Short Term Goal #2   Over the next 30 days caregiver will ensure member attends MD appointments as scheduled.  [facility] CM Short Term Goal #2 Start Date  01/07/18  [facility] CM Short Term Goal #2 Met Date  03/19/18     PLAN Will follow up later this month to discuss further care management needs and possible case closure.   [place] [phone]

## 2018-04-17 NOTE — Anesthesia Postprocedure Evaluation (Signed)
Anesthesia Post Note  Patient: Neil [name]  Procedure(s) Performed: INSERTION OF SUPRAPUBIC CATHETER (N/A )     Anesthesia Post Evaluation  Last Vitals:  Vitals:   04/16/18 0945 04/16/18 1015  BP: 128/73 133/75  Pulse: 76 77  Resp: 20 (!) 22  Temp: (!) 36.4 C 36.4 C  SpO2: 100% 100%    Last Pain:  Vitals:   04/16/18 1015  TempSrc:   PainSc: 0-No pain                 Makayla Confer COKER

## 2018-04-19 DIAGNOSIS — F0390 Unspecified dementia without behavioral disturbance: Secondary | ICD-10-CM | POA: Diagnosis not present

## 2018-04-19 DIAGNOSIS — N390 Urinary tract infection, site not specified: Secondary | ICD-10-CM | POA: Diagnosis not present

## 2018-04-19 DIAGNOSIS — J690 Pneumonitis due to inhalation of food and vomit: Secondary | ICD-10-CM | POA: Diagnosis not present

## 2018-04-19 DIAGNOSIS — I129 Hypertensive chronic kidney disease with stage 1 through stage 4 chronic kidney disease, or unspecified chronic kidney disease: Secondary | ICD-10-CM | POA: Diagnosis not present

## 2018-04-19 DIAGNOSIS — I255 Ischemic cardiomyopathy: Secondary | ICD-10-CM | POA: Diagnosis not present

## 2018-04-19 DIAGNOSIS — E1122 Type 2 diabetes mellitus with diabetic chronic kidney disease: Secondary | ICD-10-CM | POA: Diagnosis not present

## 2018-04-21 NOTE — Telephone Encounter (Signed)
Noted \ Perhaps he should followup with PCP

## 2018-04-23 DIAGNOSIS — I255 Ischemic cardiomyopathy: Secondary | ICD-10-CM | POA: Diagnosis not present

## 2018-04-23 DIAGNOSIS — R4182 Altered mental status, unspecified: Secondary | ICD-10-CM | POA: Diagnosis not present

## 2018-04-23 DIAGNOSIS — G9341 Metabolic encephalopathy: Secondary | ICD-10-CM | POA: Diagnosis not present

## 2018-04-23 DIAGNOSIS — D692 Other nonthrombocytopenic purpura: Secondary | ICD-10-CM | POA: Diagnosis not present

## 2018-04-23 DIAGNOSIS — R05 Cough: Secondary | ICD-10-CM | POA: Diagnosis not present

## 2018-04-23 DIAGNOSIS — I10 Essential (primary) hypertension: Secondary | ICD-10-CM | POA: Diagnosis not present

## 2018-04-23 DIAGNOSIS — N390 Urinary tract infection, site not specified: Secondary | ICD-10-CM | POA: Diagnosis not present

## 2018-04-23 DIAGNOSIS — I129 Hypertensive chronic kidney disease with stage 1 through stage 4 chronic kidney disease, or unspecified chronic kidney disease: Secondary | ICD-10-CM | POA: Diagnosis not present

## 2018-04-23 DIAGNOSIS — Z681 Body mass index (BMI) 19 or less, adult: Secondary | ICD-10-CM | POA: Diagnosis not present

## 2018-04-23 DIAGNOSIS — J690 Pneumonitis due to inhalation of food and vomit: Secondary | ICD-10-CM | POA: Diagnosis not present

## 2018-04-23 DIAGNOSIS — E1122 Type 2 diabetes mellitus with diabetic chronic kidney disease: Secondary | ICD-10-CM | POA: Diagnosis not present

## 2018-04-23 DIAGNOSIS — E1129 Type 2 diabetes mellitus with other diabetic kidney complication: Secondary | ICD-10-CM | POA: Diagnosis not present

## 2018-04-23 DIAGNOSIS — F0390 Unspecified dementia without behavioral disturbance: Secondary | ICD-10-CM | POA: Diagnosis not present

## 2018-04-23 DIAGNOSIS — I4891 Unspecified atrial fibrillation: Secondary | ICD-10-CM | POA: Diagnosis not present

## 2018-04-24 ENCOUNTER — Ambulatory Visit (INDEPENDENT_AMBULATORY_CARE_PROVIDER_SITE_OTHER): Payer: Medicare Other | Admitting: Internal Medicine

## 2018-04-24 ENCOUNTER — Encounter: Payer: Self-pay | Admitting: Internal Medicine

## 2018-04-24 VITALS — BP 120/56 | HR 71 | Ht 68.0 in | Wt 139.0 lb

## 2018-04-24 DIAGNOSIS — I701 Atherosclerosis of renal artery: Secondary | ICD-10-CM

## 2018-04-24 DIAGNOSIS — I4821 Permanent atrial fibrillation: Secondary | ICD-10-CM | POA: Diagnosis not present

## 2018-04-24 DIAGNOSIS — Z950 Presence of cardiac pacemaker: Secondary | ICD-10-CM | POA: Diagnosis not present

## 2018-04-24 DIAGNOSIS — I442 Atrioventricular block, complete: Secondary | ICD-10-CM

## 2018-04-24 NOTE — Progress Notes (Signed)
Electrophysiology Office Note Date: 04/24/2018  ID:  Neil [name], DOB December 20, 1928, MRN [id number]  PCP: Redmond School, MD Electrophysiologist: Caryl Comes  CC: Pacemaker follow-up  Neil [name] is a 84 y.o. male who presents today for routine electrophysiology followup.  Since last being seen in our clinic, the patient reports doing very well.   Is a permanent Foley catheter in place.  He has been trying to pull it out and so he is clogged at night.  There are concerns about his walking and falling and so he is frequently restrained during the day.  His memory is better since they removed the device from his food.  The family thinks he will recall today's Conversation tomorrow     Device History: STJ dual chamber PPM implanted 1998 for complete heart block; gen change 2009   Past Medical History:  Diagnosis Date  . Chronic kidney disease (CKD), stage III (moderate) (HCC)   . Chronic systolic CHF (congestive heart failure) ([place])   . Complete heart block ([place])   . Coronary artery disease    status post [id number]  . Dementia ([place])   . Dyslipidemia   . Foley catheter in place   . Hypertension   . Ischemic cardiomyopathy    per last echo 11-10-2017  ef 15-20%  . Osteoarthritis   . Permanent atrial fibrillation   . Presence of permanent cardiac pacemaker cardiologist-  dr Samuel Bouche Jude status post generator replacement 2009 (original placement 02/ 1998)  . Renal artery stenosis (HCC)    95%  left, 50% right  . Requires assistance with activities of daily living (ADL)   . 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 Type 2 diabetes mellitus ([place])   . Urinary retention   . Uses wheelchair    Past Surgical History:  Procedure Laterality Date  .  suspicous skin lesions-nose and upper back-removed 04/15/18    . CARDIOVASCULAR STRESS TEST  05-22-2012   dr Caryl Comes   Low risk adenosine no exercise nuclear study w/ no ischemia/  ef 31%,  LV moderately enlarged , global hypokinesis and severe  hypokinesis of the inferior wall  . CORONARY ANGIOPLASTY WITH STENT PLACEMENT  spring 1998   stenting  . CORONARY ARTERY BYPASS GRAFT  fall 1998   x4  . INSERTION OF SUPRAPUBIC CATHETER N/A 04/16/2018   Procedure: INSERTION OF SUPRAPUBIC CATHETER;  Surgeon: Irine Seal, MD;  Location: [facility] ORS;  Service: Urology;  Laterality: N/A;  . PACEMAKER GENERATOR CHANGE  10-30-2007  dr Samuel Bouche Jude dual chamber  . PACEMAKER INSERTION  02/ 1998   dr Caryl Comes  . skin lesions    . TRANSTHORACIC ECHOCARDIOGRAM  11/10/2017   mild LVH, ef 15-20%, diffuse hypokinesis, LVDF not evaluated/  mild AS/  severe LAE/  RVSF severely reduced/  mild MR, PR, and TR/  PASP 16mmHg    Current Outpatient Medications  Medication Sig Dispense Refill  . albuterol (PROVENTIL) (2.5 MG/3ML) 0.083% nebulizer solution Take 3 mLs (2.5 mg total) by nebulization every 6 (six) hours as needed for wheezing or shortness of breath. 75 mL 12  . atorvastatin (LIPITOR) 40 MG tablet Take 40 mg by mouth daily.      . dabigatran (PRADAXA) 75 MG CAPS capsule Take 75 mg by mouth daily.    . feeding supplement, ENSURE ENLIVE, (ENSURE ENLIVE) LIQD Take 237 mLs by mouth 2 (two) times daily between meals. 237 mL 12  . FEROSUL 325 (65 Fe) MG  tablet Take 1 tablet by mouth daily.  3  . furosemide (LASIX) 20 MG tablet Take 1 tablet by mouth daily.  11  . glipiZIDE (GLUCOTROL XL) 2.5 MG 24 hr tablet Take 1 tablet by mouth every evening.   11  . levothyroxine (SYNTHROID, LEVOTHROID) 25 MCG tablet Take 25 mcg by mouth daily.  1  . Maltodextrin-Xanthan Gum (RESOURCE THICKENUP CLEAR) POWD Thicken fluids to nectar thick 125 g 1  . pantoprazole (PROTONIX) 40 MG tablet Take 1 tablet (40 mg total) by mouth daily. 30 tablet 0  . polyethylene glycol (MIRALAX) packet Take 17 g by mouth daily. 14 each 0  . rOPINIRole (REQUIP) 0.5 MG tablet Take 0.5 mg by mouth at bedtime.      No current facility-administered medications for this visit.     Allergies:    Cephalexin; Penicillin g; and Penicillins   Social History: Social History   Socioeconomic History  . Marital status: Married    Spouse name: [name]  . Number of children: Not on file  . Years of education: Not on file  . Highest education level: Not on file  Occupational History  . Not on file  Social Needs  . Financial resource strain: Not hard at all  . Food insecurity:    Worry: Never true    Inability: Never true  . Transportation needs:    Medical: No    Non-medical: No  Tobacco Use  . Smoking status: Never Smoker  . Smokeless tobacco: Never Used  Substance and Sexual Activity  . Alcohol use: No  . Drug use: No  . Sexual activity: Not on file  Lifestyle  . Physical activity:    Days per week: 0 days    Minutes per session: 0 min  . Stress: Only a little  Relationships  . Social connections:    Talks on phone: Once a week    Gets together: More than three times a week    Attends religious service: 1 to 4 times per year    Active member of club or organization: No    Attends meetings of clubs or organizations: Never    Relationship status: Married  . Intimate partner violence:    Fear of current or ex partner: No    Emotionally abused: No    Physically abused: No    Forced sexual activity: No  Other Topics Concern  . Not on file  Social History Narrative  . Not on file    Family History: Family History  Problem Relation Age of Onset  . Other Mother   . Cancer Mother        breast  . Alzheimer's disease Sister      Review of Systems: All other systems reviewed and are otherwise negative except as noted above.   Physical Exam: VS:  BP (!) 120/56   Pulse 71   Ht 5\' 8"  (1.727 m)   Wt 139 lb (63 kg)   BMI 21.13 kg/m  , BMI Body mass index is 21.13 kg/m. Well developed and nourished in no acute distress HENT normal Neck supple with JVP-flat Clear Regular rate and rhythm, no murmurs or gallops Abd-soft with active BS No Clubbing cyanosis  edema Skin-warm and dry A & Oriented  Grossly normal sensory and motor function  ECG  Afib  Vpacing    PPM Interrogation- reviewed in detail today,  See PACEART report   Recent Labs: 12/17/2017: Magnesium 1.8; TSH 4.229 03/21/2018: ALT 15; BUN 38; Creatinine,  Ser 1.35; Hemoglobin 10.4; Platelets 161; Potassium 4.6; Sodium 138   Wt Readings from Last 3 Encounters:  04/24/18 139 lb (63 kg)  03/21/18 155 lb (70.3 kg)  03/03/18 159 lb 13.3 oz (72.5 kg)     Other studies Reviewed: Additional studies/ records that were reviewed today include: Dr Olin Pia office notes  Assessment and Plan:  Complete heart block   Permanent atrial fibrillation  Dementia     Atrial fibrillation is permanent.  Continues on anticoagulation.  Device function is normal with about 1 years ago prior to ERI no reprogramming  Issue of dementia and restraint is a real challenge.  Have encouraged them to think about the book Being Mortal and to try to navigate the safety/ind[place]  We spent more than 50% of our >25 min visit in face to face counseling regarding the above   Labs/ tests ordered today include:  Orders Placed This Encounter  Procedures  . EKG 12-Lead     Disposition:   Follow up  1 year, [employer] transmissions every 3 months    Signed, Virl Axe, MD  04/24/2018 12:18 PM  [place] [address] [phone] (office) [phone] (fax)

## 2018-04-24 NOTE — Patient Instructions (Addendum)
Medication Instructions:  Your physician recommends that you continue on your current medications as directed. Please refer to the Current Medication list given to you today.  Labwork: None ordered.  Testing/Procedures: None ordered.  Follow-Up: Your physician recommends that you schedule a follow-up appointment in:   One Year with Dr Caryl Comes 6 months with the Device [facility]  Any Other Special Instructions Will Be Listed Below (If Applicable).     If you need a refill on your cardiac medications before your next appointment, please call your pharmacy.

## 2018-04-25 DIAGNOSIS — J690 Pneumonitis due to inhalation of food and vomit: Secondary | ICD-10-CM | POA: Diagnosis not present

## 2018-04-25 DIAGNOSIS — E1122 Type 2 diabetes mellitus with diabetic chronic kidney disease: Secondary | ICD-10-CM | POA: Diagnosis not present

## 2018-04-25 DIAGNOSIS — I255 Ischemic cardiomyopathy: Secondary | ICD-10-CM | POA: Diagnosis not present

## 2018-04-25 DIAGNOSIS — I129 Hypertensive chronic kidney disease with stage 1 through stage 4 chronic kidney disease, or unspecified chronic kidney disease: Secondary | ICD-10-CM | POA: Diagnosis not present

## 2018-04-25 DIAGNOSIS — N390 Urinary tract infection, site not specified: Secondary | ICD-10-CM | POA: Diagnosis not present

## 2018-04-25 DIAGNOSIS — F0390 Unspecified dementia without behavioral disturbance: Secondary | ICD-10-CM | POA: Diagnosis not present

## 2018-04-29 DIAGNOSIS — I4821 Permanent atrial fibrillation: Secondary | ICD-10-CM | POA: Diagnosis not present

## 2018-04-29 DIAGNOSIS — Z466 Encounter for fitting and adjustment of urinary device: Secondary | ICD-10-CM | POA: Diagnosis not present

## 2018-04-29 DIAGNOSIS — D539 Nutritional anemia, unspecified: Secondary | ICD-10-CM | POA: Diagnosis not present

## 2018-04-29 DIAGNOSIS — I129 Hypertensive chronic kidney disease with stage 1 through stage 4 chronic kidney disease, or unspecified chronic kidney disease: Secondary | ICD-10-CM | POA: Diagnosis not present

## 2018-04-29 DIAGNOSIS — E1169 Type 2 diabetes mellitus with other specified complication: Secondary | ICD-10-CM | POA: Diagnosis not present

## 2018-04-29 DIAGNOSIS — I701 Atherosclerosis of renal artery: Secondary | ICD-10-CM | POA: Diagnosis not present

## 2018-04-29 DIAGNOSIS — R627 Adult failure to thrive: Secondary | ICD-10-CM | POA: Diagnosis not present

## 2018-04-29 DIAGNOSIS — N183 Chronic kidney disease, stage 3 (moderate): Secondary | ICD-10-CM | POA: Diagnosis not present

## 2018-04-29 DIAGNOSIS — E785 Hyperlipidemia, unspecified: Secondary | ICD-10-CM | POA: Diagnosis not present

## 2018-04-29 DIAGNOSIS — I442 Atrioventricular block, complete: Secondary | ICD-10-CM | POA: Diagnosis not present

## 2018-04-29 DIAGNOSIS — M1991 Primary osteoarthritis, unspecified site: Secondary | ICD-10-CM | POA: Diagnosis not present

## 2018-04-29 DIAGNOSIS — E1122 Type 2 diabetes mellitus with diabetic chronic kidney disease: Secondary | ICD-10-CM | POA: Diagnosis not present

## 2018-04-29 DIAGNOSIS — E43 Unspecified severe protein-calorie malnutrition: Secondary | ICD-10-CM | POA: Diagnosis not present

## 2018-04-29 DIAGNOSIS — Z951 Presence of aortocoronary bypass graft: Secondary | ICD-10-CM | POA: Diagnosis not present

## 2018-04-29 DIAGNOSIS — Z950 Presence of cardiac pacemaker: Secondary | ICD-10-CM | POA: Diagnosis not present

## 2018-04-29 DIAGNOSIS — F0390 Unspecified dementia without behavioral disturbance: Secondary | ICD-10-CM | POA: Diagnosis not present

## 2018-04-29 DIAGNOSIS — I255 Ischemic cardiomyopathy: Secondary | ICD-10-CM | POA: Diagnosis not present

## 2018-04-29 DIAGNOSIS — N390 Urinary tract infection, site not specified: Secondary | ICD-10-CM | POA: Diagnosis not present

## 2018-04-30 ENCOUNTER — Other Ambulatory Visit: Payer: Self-pay

## 2018-04-30 ENCOUNTER — Ambulatory Visit: Payer: Self-pay

## 2018-04-30 NOTE — Patient Outreach (Signed)
[place] [facility] Care Management  04/30/2018  Neil [name] Jul 14, 1928 [id number]    Successful outreach with member's spouse Neil [name]. She reported that Neil [name] was doing well, tolerating medications and eating well. No falls since discharge. Neil [name] (caregiver) was not at the home at the time of the call. Neil [name] requested that RNCM call back to speak with Neil [name] for updates and to discuss any further needs.   PLAN Will follow up with Neil [name] within a week and complete case closure if no further assistance is needed.   [place] [phone]

## 2018-05-03 ENCOUNTER — Other Ambulatory Visit: Payer: Self-pay

## 2018-05-03 DIAGNOSIS — I129 Hypertensive chronic kidney disease with stage 1 through stage 4 chronic kidney disease, or unspecified chronic kidney disease: Secondary | ICD-10-CM | POA: Diagnosis not present

## 2018-05-03 DIAGNOSIS — N390 Urinary tract infection, site not specified: Secondary | ICD-10-CM | POA: Diagnosis not present

## 2018-05-03 DIAGNOSIS — N183 Chronic kidney disease, stage 3 (moderate): Secondary | ICD-10-CM | POA: Diagnosis not present

## 2018-05-03 DIAGNOSIS — F0390 Unspecified dementia without behavioral disturbance: Secondary | ICD-10-CM | POA: Diagnosis not present

## 2018-05-03 DIAGNOSIS — I255 Ischemic cardiomyopathy: Secondary | ICD-10-CM | POA: Diagnosis not present

## 2018-05-03 DIAGNOSIS — E1122 Type 2 diabetes mellitus with diabetic chronic kidney disease: Secondary | ICD-10-CM | POA: Diagnosis not present

## 2018-05-03 NOTE — Patient Outreach (Addendum)
[place] [facility]) Care Management  05/03/2018  Neil [name] 09-22-1928 [id number]    Follow up call placed to discuss case closure with [name] primary caregiver [name]. She was unavailable at the time of the call. Unable to leave voice message due to voice mailbox being full.   PLAN Will attempt to reach caregiver on next week and complete case closure.    ADDENDUM Per [name], [name] was out due to multiple errands. Agreed to notify [name] to contact RNCM when available.    PLAN Pending return call from caregiver.   [place] [phone]

## 2018-05-15 ENCOUNTER — Encounter (HOSPITAL_COMMUNITY): Payer: Self-pay

## 2018-05-15 ENCOUNTER — Other Ambulatory Visit: Payer: Self-pay

## 2018-05-15 ENCOUNTER — Emergency Department (HOSPITAL_COMMUNITY)
Admission: EM | Admit: 2018-05-15 | Discharge: 2018-05-16 | Disposition: A | Discharge status: Home / Self Care | Payer: Medicare Other | Attending: Emergency Medicine | Admitting: Emergency Medicine

## 2018-05-15 DIAGNOSIS — R0602 Shortness of breath: Secondary | ICD-10-CM | POA: Diagnosis not present

## 2018-05-15 DIAGNOSIS — N183 Chronic kidney disease, stage 3 (moderate): Secondary | ICD-10-CM | POA: Insufficient documentation

## 2018-05-15 DIAGNOSIS — Z79899 Other long term (current) drug therapy: Secondary | ICD-10-CM | POA: Diagnosis not present

## 2018-05-15 DIAGNOSIS — N390 Urinary tract infection, site not specified: Secondary | ICD-10-CM | POA: Diagnosis not present

## 2018-05-15 DIAGNOSIS — I255 Ischemic cardiomyopathy: Secondary | ICD-10-CM | POA: Diagnosis not present

## 2018-05-15 DIAGNOSIS — E119 Type 2 diabetes mellitus without complications: Secondary | ICD-10-CM | POA: Insufficient documentation

## 2018-05-15 DIAGNOSIS — I129 Hypertensive chronic kidney disease with stage 1 through stage 4 chronic kidney disease, or unspecified chronic kidney disease: Secondary | ICD-10-CM | POA: Insufficient documentation

## 2018-05-15 DIAGNOSIS — R609 Edema, unspecified: Secondary | ICD-10-CM | POA: Diagnosis not present

## 2018-05-15 DIAGNOSIS — F0390 Unspecified dementia without behavioral disturbance: Secondary | ICD-10-CM | POA: Insufficient documentation

## 2018-05-15 DIAGNOSIS — I2510 Atherosclerotic heart disease of native coronary artery without angina pectoris: Secondary | ICD-10-CM | POA: Diagnosis not present

## 2018-05-15 DIAGNOSIS — E1122 Type 2 diabetes mellitus with diabetic chronic kidney disease: Secondary | ICD-10-CM | POA: Diagnosis not present

## 2018-05-15 DIAGNOSIS — R600 Localized edema: Secondary | ICD-10-CM | POA: Diagnosis not present

## 2018-05-15 LAB — I-STAT CG4 LACTIC ACID, ED: LACTIC ACID, VENOUS: 1.51 mmol/L (ref 0.5–1.9)

## 2018-05-15 NOTE — ED Provider Notes (Signed)
[facility] DEPT Provider Note: Georgena Spurling, MD, FACEP  CSN: [id number] MRN: [id number] ARRIVAL: 05/15/18 at 2120 ROOM: [place]  Edema  Level 5 caveat: Dementia HISTORY OF PRESENT ILLNESS  05/15/18 10:57 PM Neil [name] is a 84 y.o. male with a history of dementia who recently had a suprapubic catheter placed.  His family brings him in tonight because he has had decreased urinary output and increased peripheral edema over the last 3 days.  His daughter states he is gained 2 inches and waist girth during this time.  They have increased his oral fluid intake and they have increased his Lasix doses and he has had 80 mg today.  He has not been complaining of pain but they state he rarely complains of pain.  They initially thought he may be retaining urine but a bladder scan in triage showed minimal retained urine in the bladder.  His Foley catheter appears to be draining well.  He continues to have a good appetite.  He was recently started on Bactrim for urinary tract infection.   Past Medical History:  Diagnosis Date  . Chronic kidney disease (CKD), stage III (moderate) (HCC)   . Chronic systolic CHF (congestive heart failure) ([place])   . Complete heart block ([place])   . Coronary artery disease    status post [id number]  . Dementia ([place])   . Dyslipidemia   . Foley catheter in place   . Hypertension   . Ischemic cardiomyopathy    per last echo 11-10-2017  ef 15-20%  . Osteoarthritis   . Permanent atrial fibrillation   . Presence of permanent cardiac pacemaker cardiologist-  dr Samuel Bouche Jude status post generator replacement 2009 (original placement 02/ 1998)  . Renal artery stenosis (HCC)    95%  left, 50% right  . Requires assistance with activities of daily living (ADL)   . 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 Type 2 diabetes mellitus ([place])   . Urinary retention   . Uses wheelchair     Past Surgical History:  Procedure Laterality Date  .  suspicous skin  lesions-nose and upper back-removed 04/15/18    . CARDIOVASCULAR STRESS TEST  05-22-2012   dr Caryl Comes   Low risk adenosine no exercise nuclear study w/ no ischemia/  ef 31%,  LV moderately enlarged , global hypokinesis and severe hypokinesis of the inferior wall  . CORONARY ANGIOPLASTY WITH STENT PLACEMENT  spring 1998   stenting  . CORONARY ARTERY BYPASS GRAFT  fall 1998   x4  . INSERTION OF SUPRAPUBIC CATHETER N/A 04/16/2018   Procedure: INSERTION OF SUPRAPUBIC CATHETER;  Surgeon: Irine Seal, MD;  Location: [facility];  Service: Urology;  Laterality: N/A;  . PACEMAKER GENERATOR CHANGE  10-30-2007  dr Samuel Bouche Jude dual chamber  . PACEMAKER INSERTION  02/ 1998   dr Caryl Comes  . skin lesions    . TRANSTHORACIC ECHOCARDIOGRAM  11/10/2017   mild LVH, ef 15-20%, diffuse hypokinesis, LVDF not evaluated/  mild AS/  severe LAE/  RVSF severely reduced/  mild MR, PR, and TR/  PASP 33mmHg    Family History  Problem Relation Age of Onset  . Other Mother   . Cancer Mother        breast  . Alzheimer's disease Sister     Social History   Tobacco Use  . Smoking status: Never Smoker  . Smokeless tobacco: Never Used  Substance Use Topics  . Alcohol  use: No  . Drug use: No    Prior to Admission medications   Medication Sig Start Date End Date Taking? Authorizing Provider  albuterol (PROVENTIL) (2.5 MG/3ML) 0.083% nebulizer solution Take 3 mLs (2.5 mg total) by nebulization every 6 (six) hours as needed for wheezing or shortness of breath. 02/27/18  Yes Memon, Jolaine Artist, MD  ALPRAZolam (XANAX) 0.25 MG tablet Take 0.25 mg by mouth at bedtime.   Yes [provider]  atorvastatin (LIPITOR) 40 MG tablet Take 40 mg by mouth daily.     Yes [provider]  dabigatran (PRADAXA) 75 MG CAPS capsule Take 75 mg by mouth daily.   Yes [provider]  feeding supplement, ENSURE ENLIVE, (ENSURE ENLIVE) LIQD Take 237 mLs by mouth 2 (two) times daily between meals. 12/18/17  Yes Shah, Pratik D,  DO  FEROSUL 325 (65 Fe) MG tablet Take 1 tablet by mouth daily. 03/14/18  Yes [provider]  furosemide (LASIX) 20 MG tablet Take 80 mg by mouth 2 (two) times daily.  03/12/18  Yes [provider]  glipiZIDE (GLUCOTROL XL) 2.5 MG 24 hr tablet Take 1 tablet by mouth every evening.  03/12/18  Yes [provider]  levothyroxine (SYNTHROID, LEVOTHROID) 25 MCG tablet Take 25 mcg by mouth daily. 04/13/18  Yes [provider]  Maltodextrin-Xanthan Gum (RESOURCE THICKENUP CLEAR) POWD Thicken fluids to nectar thick 02/27/18  Yes Memon, Jolaine Artist, MD  polyethylene glycol (MIRALAX) packet Take 17 g by mouth daily. Patient taking differently: Take 17 g by mouth daily as needed for moderate constipation.  03/03/18  Yes Caryl Ada K, PA-C  rOPINIRole (REQUIP) 0.5 MG tablet Take 0.5 mg by mouth at bedtime.    Yes [provider]  sulfamethoxazole-trimethoprim (BACTRIM DS,SEPTRA DS) 800-160 MG tablet Take 1 tablet by mouth 2 (two) times daily.   Yes [provider]  pantoprazole (PROTONIX) 40 MG tablet Take 1 tablet (40 mg total) by mouth daily. Patient not taking: Reported on 05/15/2018 02/28/18   Kathie Dike, MD    Allergies Cephalexin; Penicillin g; and Penicillins   REVIEW OF SYSTEMS     PHYSICAL EXAMINATION  Initial Vital Signs Blood pressure 120/79, pulse 76, temperature (!) 97.5 F (36.4 C), temperature source Oral, resp. rate 13, height 5\' 10"  (1.778 m), weight 72.6 kg, SpO2 98 %.  Examination General: Well-developed, well-nourished male in no acute distress; appearance consistent with age of record HENT: normocephalic; atraumatic Eyes: Bilateral pseudophakia Neck: supple Heart: regular rate and rhythm with occasional skipped beats; paced rhythm on monitor Lungs: clear to auscultation bilaterally Abdomen: soft; nondistended; nontender; no masses or hepatosplenomegaly; bowel sounds present; suprapubic catheter in place draining clear yellow  urine Extremities: No deformity; full range of motion; pulses normal; pitting edema of lower extremities Neurologic: Awake, alert; motor function intact in all extremities and symmetric; no facial droop Skin: Warm and dry Psychiatric: Flat affect   RESULTS  Summary of this visit's results, reviewed by myself:   EKG Interpretation  Date/Time:    Ventricular Rate:    PR Interval:    QRS Duration:   QT Interval:    QTC Calculation:   R Axis:     Text Interpretation:        Laboratory Studies: Results for orders placed or performed during the hospital encounter of 05/15/18 (from the past 24 hour(s))  CBC with Differential/Platelet     Status: Abnormal   Collection Time: 05/15/18 11:26 PM  Result Value Ref Range   WBC  8.1 4.0 - 10.5 K/uL   RBC 3.93 (L) 4.22 - 5.81 MIL/uL   Hemoglobin 10.7 (L) 13.0 - 17.0 g/dL   HCT 33.7 (L) 39.0 - 52.0 %   MCV 85.8 80.0 - 100.0 fL   MCH 27.2 26.0 - 34.0 pg   MCHC 31.8 30.0 - 36.0 g/dL   RDW 17.6 (H) 11.5 - 15.5 %   Platelets 183 150 - 400 K/uL   nRBC 0.0 0.0 - 0.2 %   Neutrophils Relative % 83 %   Neutro Abs 6.7 1.7 - 7.7 K/uL   Lymphocytes Relative 9 %   Lymphs Abs 0.8 0.7 - 4.0 K/uL   Monocytes Relative 6 %   Monocytes Absolute 0.5 0.1 - 1.0 K/uL   Eosinophils Relative 1 %   Eosinophils Absolute 0.1 0.0 - 0.5 K/uL   Basophils Relative 0 %   Basophils Absolute 0.0 0.0 - 0.1 K/uL   Immature Granulocytes 1 %   Abs Immature Granulocytes 0.04 0.00 - 0.07 K/uL  Comprehensive metabolic panel     Status: Abnormal   Collection Time: 05/15/18 11:26 PM  Result Value Ref Range   Sodium 135 135 - 145 mmol/L   Potassium 4.3 3.5 - 5.1 mmol/L   Chloride 98 98 - 111 mmol/L   CO2 27 22 - 32 mmol/L   Glucose, Bld 242 (H) 70 - 99 mg/dL   BUN 51 (H) 8 - 23 mg/dL   Creatinine, Ser 1.38 (H) 0.61 - 1.24 mg/dL   Calcium 9.2 8.9 - 10.3 mg/dL   Total Protein 6.5 6.5 - 8.1 g/dL   Albumin 3.4 (L) 3.5 - 5.0 g/dL   AST 19 15 - 41 U/L   ALT 10 0 - 44 U/L    Alkaline Phosphatase 73 38 - 126 U/L   Total Bilirubin 1.3 (H) 0.3 - 1.2 mg/dL   GFR calc non Af Amer 44 (L) >60 mL/min   GFR calc Af Amer 51 (L) >60 mL/min   Anion gap 10 5 - 15  Brain natriuretic peptide     Status: Abnormal   Collection Time: 05/15/18 11:26 PM  Result Value Ref Range   B Natriuretic Peptide 506.0 (H) 0.0 - 100.0 pg/mL  Troponin I     Status: Abnormal   Collection Time: 05/15/18 11:26 PM  Result Value Ref Range   Troponin I 0.04 (HH) <0.03 ng/mL  I-Stat CG4 Lactic Acid, ED     Status: None   Collection Time: 05/15/18 11:46 PM  Result Value Ref Range   Lactic Acid, Venous 1.51 0.5 - 1.9 mmol/L   Imaging Studies: Dg Chest 2 View  Result Date: 05/16/2018 CLINICAL DATA:  Shortness of breath EXAM: CHEST - 2 VIEW COMPARISON:  03/01/2018 and 03/03/2018 FINDINGS: Unchanged S1 of left chest wall pacemaker leads. Status post CABG. Unchanged cardiomegaly. No focal airspace consolidation or pulmonary edema. No pleural effusion or pneumothorax. IMPRESSION: No active cardiopulmonary disease. Electronically Signed   By: Ulyses Jarred M.D.   On: 05/16/2018 03:16    ED COURSE and MDM  Nursing notes and initial vitals signs, including pulse oximetry, reviewed.  Vitals:   05/16/18 0030 05/16/18 0100 05/16/18 0130 05/16/18 0200  BP: 121/62 113/66 117/63 109/60  Pulse: 77 77 77 75  Resp: 18  15 15   Temp:      TempSrc:      SpO2: 100% 100% 100% 99%  Weight:      Height:       3:28 AM Patient is  remained stable in the ED.  There is no evidence of acute renal failure or acute cardiac event.  His troponin is elevated chronically.  I do not believe admission to the hospital is indicated.  He was advised to elevate his legs while in bed.  He has an appointment later today with Dr. Jeffie Pollock for evaluation of his urostomy.  PROCEDURES    ED DIAGNOSES     ICD-10-CM   1. Peripheral edema R60.9        Blakelyn Dinges, Jenny Reichmann, MD 05/16/18 [phone]

## 2018-05-15 NOTE — ED Triage Notes (Signed)
Pt arrived escorted with Dtr and wife.. Pt dtr states that pt has not had much urinary output and has increased abdominal girth. Pt was advised to come to [facility]. Pt has a foley cath in place, some 2+ pitting edema is noted. No abdominal distention is noted and foley Cath has 400 cc of urine noted in bag in which DTR states has been the total output in the past 24 hours. Pt dtr states that pt has recent been recently dx with UTI and is on abx currently to treat. Pt dtr states that pt has hx PM, CHF hx and reports a dry cough. Pt dtr also reports that pt was placed on a diuretic and has not had much output.

## 2018-05-16 ENCOUNTER — Emergency Department (HOSPITAL_COMMUNITY): Payer: Medicare Other

## 2018-05-16 DIAGNOSIS — R0602 Shortness of breath: Secondary | ICD-10-CM | POA: Diagnosis not present

## 2018-05-16 DIAGNOSIS — R609 Edema, unspecified: Secondary | ICD-10-CM | POA: Diagnosis not present

## 2018-05-16 DIAGNOSIS — N312 Flaccid neuropathic bladder, not elsewhere classified: Secondary | ICD-10-CM | POA: Diagnosis not present

## 2018-05-16 LAB — CBC WITH DIFFERENTIAL/PLATELET
Abs Immature Granulocytes: 0.04 10*3/uL (ref 0.00–0.07)
BASOS PCT: 0 %
Basophils Absolute: 0 10*3/uL (ref 0.0–0.1)
Eosinophils Absolute: 0.1 10*3/uL (ref 0.0–0.5)
Eosinophils Relative: 1 %
HCT: 33.7 % — ABNORMAL LOW (ref 39.0–52.0)
Hemoglobin: 10.7 g/dL — ABNORMAL LOW (ref 13.0–17.0)
Immature Granulocytes: 1 %
Lymphocytes Relative: 9 %
Lymphs Abs: 0.8 10*3/uL (ref 0.7–4.0)
MCH: 27.2 pg (ref 26.0–34.0)
MCHC: 31.8 g/dL (ref 30.0–36.0)
MCV: 85.8 fL (ref 80.0–100.0)
MONO ABS: 0.5 10*3/uL (ref 0.1–1.0)
MONOS PCT: 6 %
NEUTROS ABS: 6.7 10*3/uL (ref 1.7–7.7)
Neutrophils Relative %: 83 %
PLATELETS: 183 10*3/uL (ref 150–400)
RBC: 3.93 MIL/uL — AB (ref 4.22–5.81)
RDW: 17.6 % — ABNORMAL HIGH (ref 11.5–15.5)
WBC: 8.1 10*3/uL (ref 4.0–10.5)
nRBC: 0 % (ref 0.0–0.2)

## 2018-05-16 LAB — COMPREHENSIVE METABOLIC PANEL
ALT: 10 U/L (ref 0–44)
ANION GAP: 10 (ref 5–15)
AST: 19 U/L (ref 15–41)
Albumin: 3.4 g/dL — ABNORMAL LOW (ref 3.5–5.0)
Alkaline Phosphatase: 73 U/L (ref 38–126)
BUN: 51 mg/dL — ABNORMAL HIGH (ref 8–23)
CALCIUM: 9.2 mg/dL (ref 8.9–10.3)
CHLORIDE: 98 mmol/L (ref 98–111)
CO2: 27 mmol/L (ref 22–32)
CREATININE: 1.38 mg/dL — AB (ref 0.61–1.24)
GFR, EST AFRICAN AMERICAN: 51 mL/min — AB (ref >60)
GFR, EST NON AFRICAN AMERICAN: 44 mL/min — AB (ref >60)
Glucose, Bld: 242 mg/dL — ABNORMAL HIGH (ref 70–99)
Potassium: 4.3 mmol/L (ref 3.5–5.1)
SODIUM: 135 mmol/L (ref 135–145)
Total Bilirubin: 1.3 mg/dL — ABNORMAL HIGH (ref 0.3–1.2)
Total Protein: 6.5 g/dL (ref 6.5–8.1)

## 2018-05-16 LAB — TROPONIN I: Troponin I: 0.04 ng/mL (ref <0.03)

## 2018-05-16 LAB — BRAIN NATRIURETIC PEPTIDE: B NATRIURETIC PEPTIDE 5: 506 pg/mL — AB (ref 0.0–100.0)

## 2018-05-16 NOTE — ED Notes (Signed)
Critical lab received from lab. Troponin 0.04. Will report to Dr. Florina Ou

## 2018-05-19 ENCOUNTER — Encounter (HOSPITAL_COMMUNITY): Payer: Self-pay | Admitting: *Deleted

## 2018-05-19 ENCOUNTER — Inpatient Hospital Stay (HOSPITAL_COMMUNITY)
Admission: EM | Admit: 2018-05-19 | Discharge: 2018-05-22 | DRG: 698 | Disposition: A | Discharge status: Home Health | Payer: Medicare Other | Attending: Internal Medicine | Admitting: Internal Medicine

## 2018-05-19 DIAGNOSIS — Z7984 Long term (current) use of oral hypoglycemic drugs: Secondary | ICD-10-CM

## 2018-05-19 DIAGNOSIS — Z950 Presence of cardiac pacemaker: Secondary | ICD-10-CM

## 2018-05-19 DIAGNOSIS — R339 Retention of urine, unspecified: Secondary | ICD-10-CM | POA: Diagnosis present

## 2018-05-19 DIAGNOSIS — I442 Atrioventricular block, complete: Secondary | ICD-10-CM | POA: Diagnosis present

## 2018-05-19 DIAGNOSIS — Z6821 Body mass index (BMI) 21.0-21.9, adult: Secondary | ICD-10-CM

## 2018-05-19 DIAGNOSIS — T83518A Infection and inflammatory reaction due to other urinary catheter, initial encounter: Principal | ICD-10-CM | POA: Diagnosis present

## 2018-05-19 DIAGNOSIS — N3001 Acute cystitis with hematuria: Secondary | ICD-10-CM

## 2018-05-19 DIAGNOSIS — Z7951 Long term (current) use of inhaled steroids: Secondary | ICD-10-CM

## 2018-05-19 DIAGNOSIS — E039 Hypothyroidism, unspecified: Secondary | ICD-10-CM | POA: Diagnosis present

## 2018-05-19 DIAGNOSIS — I255 Ischemic cardiomyopathy: Secondary | ICD-10-CM | POA: Diagnosis present

## 2018-05-19 DIAGNOSIS — G9341 Metabolic encephalopathy: Secondary | ICD-10-CM | POA: Diagnosis present

## 2018-05-19 DIAGNOSIS — N183 Chronic kidney disease, stage 3 (moderate): Secondary | ICD-10-CM | POA: Diagnosis present

## 2018-05-19 DIAGNOSIS — E43 Unspecified severe protein-calorie malnutrition: Secondary | ICD-10-CM | POA: Diagnosis not present

## 2018-05-19 DIAGNOSIS — N390 Urinary tract infection, site not specified: Secondary | ICD-10-CM | POA: Diagnosis not present

## 2018-05-19 DIAGNOSIS — E860 Dehydration: Secondary | ICD-10-CM | POA: Diagnosis present

## 2018-05-19 DIAGNOSIS — Z881 Allergy status to other antibiotic agents status: Secondary | ICD-10-CM

## 2018-05-19 DIAGNOSIS — I5020 Unspecified systolic (congestive) heart failure: Secondary | ICD-10-CM

## 2018-05-19 DIAGNOSIS — E1122 Type 2 diabetes mellitus with diabetic chronic kidney disease: Secondary | ICD-10-CM | POA: Diagnosis present

## 2018-05-19 DIAGNOSIS — Z951 Presence of aortocoronary bypass graft: Secondary | ICD-10-CM

## 2018-05-19 DIAGNOSIS — R319 Hematuria, unspecified: Secondary | ICD-10-CM | POA: Diagnosis present

## 2018-05-19 DIAGNOSIS — Z955 Presence of coronary angioplasty implant and graft: Secondary | ICD-10-CM

## 2018-05-19 DIAGNOSIS — D631 Anemia in chronic kidney disease: Secondary | ICD-10-CM | POA: Diagnosis present

## 2018-05-19 DIAGNOSIS — Z79899 Other long term (current) drug therapy: Secondary | ICD-10-CM

## 2018-05-19 DIAGNOSIS — L89611 Pressure ulcer of right heel, stage 1: Secondary | ICD-10-CM | POA: Diagnosis present

## 2018-05-19 DIAGNOSIS — N312 Flaccid neuropathic bladder, not elsewhere classified: Secondary | ICD-10-CM | POA: Diagnosis present

## 2018-05-19 DIAGNOSIS — R451 Restlessness and agitation: Secondary | ICD-10-CM | POA: Diagnosis present

## 2018-05-19 DIAGNOSIS — I5022 Chronic systolic (congestive) heart failure: Secondary | ICD-10-CM | POA: Diagnosis not present

## 2018-05-19 DIAGNOSIS — N179 Acute kidney failure, unspecified: Secondary | ICD-10-CM | POA: Diagnosis not present

## 2018-05-19 DIAGNOSIS — I130 Hypertensive heart and chronic kidney disease with heart failure and stage 1 through stage 4 chronic kidney disease, or unspecified chronic kidney disease: Secondary | ICD-10-CM | POA: Diagnosis not present

## 2018-05-19 DIAGNOSIS — I2510 Atherosclerotic heart disease of native coronary artery without angina pectoris: Secondary | ICD-10-CM | POA: Diagnosis present

## 2018-05-19 DIAGNOSIS — Z880 Allergy status to penicillin: Secondary | ICD-10-CM

## 2018-05-19 DIAGNOSIS — Y846 Urinary catheterization as the cause of abnormal reaction of the patient, or of later complication, without mention of misadventure at the time of the procedure: Secondary | ICD-10-CM | POA: Diagnosis present

## 2018-05-19 DIAGNOSIS — E785 Hyperlipidemia, unspecified: Secondary | ICD-10-CM | POA: Diagnosis present

## 2018-05-19 DIAGNOSIS — Z7189 Other specified counseling: Secondary | ICD-10-CM

## 2018-05-19 DIAGNOSIS — I4821 Permanent atrial fibrillation: Secondary | ICD-10-CM | POA: Diagnosis present

## 2018-05-19 DIAGNOSIS — Z515 Encounter for palliative care: Secondary | ICD-10-CM

## 2018-05-19 DIAGNOSIS — L89621 Pressure ulcer of left heel, stage 1: Secondary | ICD-10-CM | POA: Diagnosis present

## 2018-05-19 DIAGNOSIS — Z993 Dependence on wheelchair: Secondary | ICD-10-CM

## 2018-05-19 DIAGNOSIS — G2581 Restless legs syndrome: Secondary | ICD-10-CM | POA: Diagnosis present

## 2018-05-19 LAB — CBC WITH DIFFERENTIAL/PLATELET
ABS IMMATURE GRANULOCYTES: 0.02 10*3/uL (ref 0.00–0.07)
Basophils Absolute: 0 10*3/uL (ref 0.0–0.1)
Basophils Relative: 0 %
EOS PCT: 3 %
Eosinophils Absolute: 0.2 10*3/uL (ref 0.0–0.5)
HCT: 34.7 % — ABNORMAL LOW (ref 39.0–52.0)
Hemoglobin: 10.8 g/dL — ABNORMAL LOW (ref 13.0–17.0)
Immature Granulocytes: 0 %
LYMPHS PCT: 16 %
Lymphs Abs: 1 10*3/uL (ref 0.7–4.0)
MCH: 26.4 pg (ref 26.0–34.0)
MCHC: 31.1 g/dL (ref 30.0–36.0)
MCV: 84.8 fL (ref 80.0–100.0)
MONO ABS: 0.6 10*3/uL (ref 0.1–1.0)
Monocytes Relative: 9 %
NRBC: 0 % (ref 0.0–0.2)
Neutro Abs: 4.7 10*3/uL (ref 1.7–7.7)
Neutrophils Relative %: 72 %
PLATELETS: 196 10*3/uL (ref 150–400)
RBC: 4.09 MIL/uL — AB (ref 4.22–5.81)
RDW: 17.9 % — ABNORMAL HIGH (ref 11.5–15.5)
WBC: 6.6 10*3/uL (ref 4.0–10.5)

## 2018-05-19 LAB — BASIC METABOLIC PANEL
Anion gap: 10 (ref 5–15)
BUN: 51 mg/dL — AB (ref 8–23)
CO2: 26 mmol/L (ref 22–32)
CREATININE: 2.03 mg/dL — AB (ref 0.61–1.24)
Calcium: 8.9 mg/dL (ref 8.9–10.3)
Chloride: 100 mmol/L (ref 98–111)
GFR calc Af Amer: 32 mL/min — ABNORMAL LOW (ref >60)
GFR, EST NON AFRICAN AMERICAN: 28 mL/min — AB (ref >60)
GLUCOSE: 75 mg/dL (ref 70–99)
POTASSIUM: 4.4 mmol/L (ref 3.5–5.1)
Sodium: 136 mmol/L (ref 135–145)

## 2018-05-19 LAB — URINALYSIS, ROUTINE W REFLEX MICROSCOPIC
Bilirubin Urine: NEGATIVE
Glucose, UA: NEGATIVE mg/dL
Ketones, ur: NEGATIVE mg/dL
Nitrite: NEGATIVE
PH: 6 (ref 5.0–8.0)
Protein, ur: 30 mg/dL — AB
RBC / HPF: >50 RBC/hpf — ABNORMAL HIGH (ref 0–5)
SPECIFIC GRAVITY, URINE: 1.01 (ref 1.005–1.030)

## 2018-05-19 MED ORDER — SODIUM CHLORIDE 0.9 % IV SOLN: INTRAVENOUS | Status: DC

## 2018-05-19 MED ORDER — CIPROFLOXACIN IN D5W 400 MG/200ML IV SOLN: 400.0000 mg | Freq: Every day | INTRAVENOUS | Status: DC

## 2018-05-19 MED ORDER — ROPINIROLE HCL 0.5 MG PO TABS
0.5000 mg | ORAL_TABLET | Freq: Every day | ORAL | Status: DC
  Administered 2018-05-20 – 2018-05-21 (×3): 0.5 mg via ORAL
  Filled 2018-05-19 (×4): qty 1

## 2018-05-19 MED ORDER — ALPRAZOLAM 0.25 MG PO TABS
0.2500 mg | ORAL_TABLET | Freq: Every day | ORAL | Status: DC
  Administered 2018-05-20 (×2): 0.25 mg via ORAL
  Filled 2018-05-19 (×2): qty 1

## 2018-05-19 MED ORDER — BISACODYL 5 MG PO TBEC: 5.0000 mg | DELAYED_RELEASE_TABLET | Freq: Every day | ORAL | Status: DC | PRN

## 2018-05-19 MED ORDER — SENNA 8.6 MG PO TABS
1.0000 | ORAL_TABLET | Freq: Two times a day (BID) | ORAL | Status: DC
  Administered 2018-05-20 – 2018-05-21 (×4): 8.6 mg via ORAL
  Filled 2018-05-19 (×4): qty 1

## 2018-05-19 MED ORDER — CIPROFLOXACIN IN D5W 400 MG/200ML IV SOLN
400.0000 mg | Freq: Once | INTRAVENOUS | Status: AC
  Administered 2018-05-19: 400 mg via INTRAVENOUS
  Filled 2018-05-19: qty 200

## 2018-05-19 MED ORDER — SODIUM CHLORIDE 0.9 % IV BOLUS
500.0000 mL | Freq: Once | INTRAVENOUS | Status: AC
  Administered 2018-05-19: 500 mL via INTRAVENOUS

## 2018-05-19 MED ORDER — ALBUTEROL SULFATE (2.5 MG/3ML) 0.083% IN NEBU: 2.5000 mg | INHALATION_SOLUTION | Freq: Four times a day (QID) | RESPIRATORY_TRACT | Status: DC | PRN

## 2018-05-19 MED ORDER — DOCUSATE SODIUM 100 MG PO CAPS
100.0000 mg | ORAL_CAPSULE | Freq: Two times a day (BID) | ORAL | Status: DC
  Administered 2018-05-20 – 2018-05-21 (×4): 100 mg via ORAL
  Filled 2018-05-19 (×4): qty 1

## 2018-05-19 NOTE — ED Notes (Signed)
ED TO INPATIENT HANDOFF REPORT  Name/Age/Gender Neil [name] 84 y.o. male  Code Status    Code Status Orders  (From admission, onward)         Start     Ordered   05/19/18 2211  Full code  Continuous     05/19/18 2216        Code Status History    Date Active Date Inactive Code Status Order ID Comments User Context   02/26/2018 0000 02/27/2018 2151 Full Code [id number]  Rodena Goldmann, DO Inpatient   12/17/2017 1619 12/18/2017 1918 Full Code [id number]  Phillips Grout, MD ED   11/09/2017 1731 11/11/2017 1733 Full Code [id number]  Phillips Grout, MD ED      Home/SNF/Other Home  Chief Complaint Doctor Referral because of Urinalysis  Level of Care/Admitting Diagnosis ED Disposition    ED Disposition Condition [place]: [facility] [[id number]]  Level of Care: Med-Surg [16]  Diagnosis: UTI (urinary tract infection) [696789]  Admitting Physician: Colbert Ewing [[id number]]  Attending Physician: Colbert Ewing [[id number]]  PT Class (Do Not Modify): Observation [104]  PT Acc Code (Do Not Modify): Observation [10022]       Medical History Past Medical History:  Diagnosis Date  . Chronic kidney disease (CKD), stage III (moderate) (HCC)   . Chronic systolic CHF (congestive heart failure) ([place])   . Complete heart block ([place])   . Coronary artery disease    status post [id number]  . Dementia ([place])   . Dyslipidemia   . Foley catheter in place   . Hypertension   . Ischemic cardiomyopathy    per last echo 11-10-2017  ef 15-20%  . Osteoarthritis   . Permanent atrial fibrillation   . Presence of permanent cardiac pacemaker cardiologist-  dr Samuel Bouche Jude status post generator replacement 2009 (original placement 02/ 1998)  . Renal artery stenosis (HCC)    95%  left, 50% right  . Requires assistance with activities of daily living (ADL)   . 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 Type 2 diabetes mellitus ([place])   . Urinary retention   . Uses  wheelchair     Allergies Allergies  Allergen Reactions  . Cephalexin Other (See Comments)    hallucinations Other reaction(s): Delusions (intolerance)  . Penicillin G Itching  . Penicillins Swelling and Rash    Itching Has patient had a PCN reaction causing immediate rash, facial/tongue/throat swelling, SOB or lightheadedness with hypotension: Yes Has patient had a PCN reaction causing severe rash involving mucus membranes or skin necrosis: Yes Has patient had a PCN reaction that required hospitalization: No Has patient had a PCN reaction occurring within the last 10 years: No If all of the above answers are "NO", then may proceed with Cephalosporin use.    IV Location/Drains/Wounds Patient Lines/Drains/Airways Status   Active Line/Drains/Airways    Name:   Placement date:   Placement time:   Site:   Days:   Peripheral IV 05/19/18 Right Forearm   05/19/18    2149    Forearm   less than 1   Suprapubic Catheter Latex;Double-lumen 20 Fr.   04/16/18    0755    Latex;Double-lumen   33   Incision (Closed) 04/16/18 Abdomen   04/16/18    0801     33          Labs/Imaging Results for orders placed or performed during the hospital encounter of  05/19/18 (from the past 48 hour(s))  CBC with Differential/Platelet     Status: Abnormal   Collection Time: 05/19/18  8:18 PM  Result Value Ref Range   WBC 6.6 4.0 - 10.5 K/uL   RBC 4.09 (L) 4.22 - 5.81 MIL/uL   Hemoglobin 10.8 (L) 13.0 - 17.0 g/dL   HCT 34.7 (L) 39.0 - 52.0 %   MCV 84.8 80.0 - 100.0 fL   MCH 26.4 26.0 - 34.0 pg   MCHC 31.1 30.0 - 36.0 g/dL   RDW 17.9 (H) 11.5 - 15.5 %   Platelets 196 150 - 400 K/uL   nRBC 0.0 0.0 - 0.2 %   Neutrophils Relative % 72 %   Neutro Abs 4.7 1.7 - 7.7 K/uL   Lymphocytes Relative 16 %   Lymphs Abs 1.0 0.7 - 4.0 K/uL   Monocytes Relative 9 %   Monocytes Absolute 0.6 0.1 - 1.0 K/uL   Eosinophils Relative 3 %   Eosinophils Absolute 0.2 0.0 - 0.5 K/uL   Basophils Relative 0 %   Basophils  Absolute 0.0 0.0 - 0.1 K/uL   Immature Granulocytes 0 %   Abs Immature Granulocytes 0.02 0.00 - 0.07 K/uL    Comment: Performed at [facility], [place] [address], [place], [place] [address]  Basic metabolic panel     Status: Abnormal   Collection Time: 05/19/18  8:18 PM  Result Value Ref Range   Sodium 136 135 - 145 mmol/L   Potassium 4.4 3.5 - 5.1 mmol/L   Chloride 100 98 - 111 mmol/L   CO2 26 22 - 32 mmol/L   Glucose, Bld 75 70 - 99 mg/dL   BUN 51 (H) 8 - 23 mg/dL   Creatinine, Ser 2.03 (H) 0.61 - 1.24 mg/dL   Calcium 8.9 8.9 - 10.3 mg/dL   GFR calc non Af Amer 28 (L) >60 mL/min   GFR calc Af Amer 32 (L) >60 mL/min    Comment: (NOTE) The eGFR has been calculated using the CKD EPI equation. This calculation has not been validated in all clinical situations. eGFR's persistently <60 mL/min signify possible Chronic Kidney Disease.    Anion gap 10 5 - 15    Comment: Performed at [facility], [place] [address], [place], [place] [address]  Urinalysis, Routine w reflex microscopic     Status: Abnormal   Collection Time: 05/19/18  8:18 PM  Result Value Ref Range   Color, Urine YELLOW YELLOW   APPearance HAZY (A) CLEAR   Specific Gravity, Urine 1.010 1.005 - 1.030   pH 6.0 5.0 - 8.0   Glucose, UA NEGATIVE NEGATIVE mg/dL   Hgb urine dipstick LARGE (A) NEGATIVE   Bilirubin Urine NEGATIVE NEGATIVE   Ketones, ur NEGATIVE NEGATIVE mg/dL   Protein, ur 30 (A) NEGATIVE mg/dL   Nitrite NEGATIVE NEGATIVE   Leukocytes, UA TRACE (A) NEGATIVE   RBC / HPF >50 (H) 0 - 5 RBC/hpf   WBC, UA 11-20 0 - 5 WBC/hpf   Bacteria, UA RARE (A) NONE SEEN    Comment: Performed at [facility], [place] [address], [place], [place] [address]   No results found. None  Pending Labs Unresulted Labs (From admission, onward)    Start     Ordered   05/20/18 5110  Basic metabolic panel  Daily,   R     05/19/18 2216   05/20/18 0500  Magnesium  Daily,   R      05/19/18  2216   05/20/18 0500  CBC with Differential/Platelet  Daily,   R     05/19/18 2216   05/19/18 1902  Urine Culture  STAT - ONCE,   STAT     05/19/18 1901          Vitals/Pain Today's Vitals   05/19/18 1849 05/19/18 1942 05/19/18 2024 05/19/18 2213  BP:   120/71 116/64  Pulse:   76 77  Resp:   16 18  Temp:      TempSrc:      SpO2:   99% 100%  Weight:      Height:      PainSc: 0-No pain 0-No pain      Isolation Precautions No active isolations  Medications Medications  ciprofloxacin (CIPRO) IVPB 400 mg (400 mg Intravenous New Bag/Given 05/19/18 2212)  ALPRAZolam (XANAX) tablet 0.25 mg (has no administration in time range)  albuterol (PROVENTIL) (2.5 MG/3ML) 0.083% nebulizer solution 2.5 mg (has no administration in time range)  senna (SENOKOT) tablet 8.6 mg (has no administration in time range)  docusate sodium (COLACE) capsule 100 mg (has no administration in time range)  bisacodyl (DULCOLAX) EC tablet 5 mg (has no administration in time range)  0.9 %  sodium chloride infusion (has no administration in time range)  rOPINIRole (REQUIP) tablet 0.5 mg (has no administration in time range)  ciprofloxacin (CIPRO) IVPB 400 mg (has no administration in time range)  sodium chloride 0.9 % bolus 500 mL (500 mLs Intravenous New Bag/Given 05/19/18 2212)    Mobility walks

## 2018-05-19 NOTE — ED Notes (Signed)
Hospitalist at bedside 

## 2018-05-19 NOTE — ED Triage Notes (Signed)
Pt here for hematuria.  Pt's daughter noticed blood in catheter and agitation yesterday, which increased today. Pt's daughter called urologist and was told to come to ED.

## 2018-05-19 NOTE — ED Provider Notes (Signed)
[facility] DEPT Provider Note   CSN: [id number] Arrival date & time: 05/19/18  1834     History   Chief Complaint Chief Complaint  Patient presents with  . Hematuria    HPI SHOWN Neil [name] is a 84 y.o. male.  84 year old male presents with hematuria x1 day.  Has had several nights of increased agitation.  Concern for possible urinary source of infection.  Seen in ED several days ago and diagnosed with UTI and placed on Bactrim.  Daughter states that patient has been compliant.  Patient becomes more altered at night and is fine throughout the day.  Noted fever at home.  Is been eating appropriately no emesis noted.     Past Medical History:  Diagnosis Date  . Chronic kidney disease (CKD), stage III (moderate) (HCC)   . Chronic systolic CHF (congestive heart failure) ([place])   . Complete heart block ([place])   . Coronary artery disease    status post [id number]  . Dementia ([place])   . Dyslipidemia   . Foley catheter in place   . Hypertension   . Ischemic cardiomyopathy    per last echo 11-10-2017  ef 15-20%  . Osteoarthritis   . Permanent atrial fibrillation   . Presence of permanent cardiac pacemaker cardiologist-  dr Samuel Bouche Jude status post generator replacement 2009 (original placement 02/ 1998)  . Renal artery stenosis (HCC)    95%  left, 50% right  . Requires assistance with activities of daily living (ADL)   . 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 Type 2 diabetes mellitus ([place])   . Urinary retention   . Uses wheelchair     Patient Active Problem List   Diagnosis Date Noted  . Aspiration pneumonia ([place]) 02/25/2018  . UTI (urinary tract infection) 02/25/2018  . Protein-calorie malnutrition, severe 12/18/2017  . Hypoglycemia due to type 2 diabetes mellitus ([place]) 12/17/2017  . Confusion   . General weakness   . Dehydration 11/09/2017  . FTT (failure to thrive) in adult 11/09/2017  . AKI (acute kidney injury) ([place]) 11/09/2017  . Dementia ([place])  11/09/2017  . Renal artery stenosis ([place])   . CKD (chronic kidney disease) stage 3, GFR 30-59 ml/min (HCC)   . Pacemaker-St. Jude 04/20/2011  . Permanent atrial fibrillation 05/03/2010  . AV BLOCK, COMPLETE 11/02/2009  . ABDOMINAL PAIN-EPIGASTRIC 10/27/2008  . AODM 07/25/2008  . Type 2 diabetes mellitus with hyperlipidemia ([place]) 07/25/2008  . Cardiomyopathy, ischemic 07/25/2008  . ATHEROSCLEROSIS OF RENAL ARTERY 07/25/2008  . HYPERTENSION, BENIGN 07/25/2008    Past Surgical History:  Procedure Laterality Date  .  suspicous skin lesions-nose and upper back-removed 04/15/18    . CARDIOVASCULAR STRESS TEST  05-22-2012   dr Caryl Comes   Low risk adenosine no exercise nuclear study w/ no ischemia/  ef 31%,  LV moderately enlarged , global hypokinesis and severe hypokinesis of the inferior wall  . CORONARY ANGIOPLASTY WITH STENT PLACEMENT  spring 1998   stenting  . CORONARY ARTERY BYPASS GRAFT  fall 1998   x4  . INSERTION OF SUPRAPUBIC CATHETER N/A 04/16/2018   Procedure: INSERTION OF SUPRAPUBIC CATHETER;  Surgeon: Irine Seal, MD;  Location: [facility];  Service: Urology;  Laterality: N/A;  . PACEMAKER GENERATOR CHANGE  10-30-2007  dr Samuel Bouche Jude dual chamber  . PACEMAKER INSERTION  02/ 1998   dr Caryl Comes  . skin lesions    . TRANSTHORACIC ECHOCARDIOGRAM  11/10/2017   mild LVH, ef  15-20%, diffuse hypokinesis, LVDF not evaluated/  mild AS/  severe LAE/  RVSF severely reduced/  mild MR, PR, and TR/  PASP 62mmHg        Home Medications    Prior to Admission medications   Medication Sig Start Date End Date Taking? Authorizing Provider  albuterol (PROVENTIL) (2.5 MG/3ML) 0.083% nebulizer solution Take 3 mLs (2.5 mg total) by nebulization every 6 (six) hours as needed for wheezing or shortness of breath. 02/27/18   Kathie Dike, MD  ALPRAZolam Duanne Moron) 0.25 MG tablet Take 0.25 mg by mouth at bedtime.    [provider]  atorvastatin (LIPITOR) 40 MG tablet Take 40 mg by mouth daily.       [provider]  dabigatran (PRADAXA) 75 MG CAPS capsule Take 75 mg by mouth daily.    [provider]  feeding supplement, ENSURE ENLIVE, (ENSURE ENLIVE) LIQD Take 237 mLs by mouth 2 (two) times daily between meals. 12/18/17   Manuella Ghazi, Pratik D, DO  FEROSUL 325 (65 Fe) MG tablet Take 1 tablet by mouth daily. 03/14/18   [provider]  furosemide (LASIX) 20 MG tablet Take 80 mg by mouth 2 (two) times daily.  03/12/18   [provider]  glipiZIDE (GLUCOTROL XL) 2.5 MG 24 hr tablet Take 1 tablet by mouth every evening.  03/12/18   [provider]  levothyroxine (SYNTHROID, LEVOTHROID) 25 MCG tablet Take 25 mcg by mouth daily. 04/13/18   [provider]  Maltodextrin-Xanthan Gum (RESOURCE THICKENUP CLEAR) POWD Thicken fluids to nectar thick 02/27/18   Kathie Dike, MD  polyethylene glycol (MIRALAX) packet Take 17 g by mouth daily. Patient taking differently: Take 17 g by mouth daily as needed for moderate constipation.  03/03/18   Fransico Meadow, PA-C  rOPINIRole (REQUIP) 0.5 MG tablet Take 0.5 mg by mouth at bedtime.     [provider]  sulfamethoxazole-trimethoprim (BACTRIM DS,SEPTRA DS) 800-160 MG tablet Take 1 tablet by mouth 2 (two) times daily.    [provider]    Family History Family History  Problem Relation Age of Onset  . Other Mother   . Cancer Mother        breast  . Alzheimer's disease Sister     Social History Social History   Tobacco Use  . Smoking status: Never Smoker  . Smokeless tobacco: Never Used  Substance Use Topics  . Alcohol use: No  . Drug use: No     Allergies   Cephalexin; Penicillin g; and Penicillins   Review of Systems Review of Systems  All other systems reviewed and are negative.    Physical Exam Updated Vital Signs BP 116/62 (BP Location: Left Arm)   Pulse 70   Temp 98.5 F (36.9 C) (Oral)   Resp 15   Ht 1.727 m (5\' 8" )   Wt 67.1 kg   SpO2 100%   BMI 22.50 kg/m    Physical Exam  Constitutional: He appears well-developed and well-nourished.  Non-toxic appearance. No distress.  HENT:  Head: Normocephalic and atraumatic.  Eyes: Pupils are equal, round, and reactive to light. Conjunctivae, EOM and lids are normal.  Neck: Normal range of motion. Neck supple. No tracheal deviation present. No thyroid mass present.  Cardiovascular: Normal rate, regular rhythm and normal heart sounds. Exam reveals no gallop.  No murmur heard. Pulmonary/Chest: Effort normal and breath sounds normal. No stridor. No respiratory distress. He has no decreased breath sounds. He has no wheezes. He has no rhonchi.  He has no rales.  Abdominal: Soft. Normal appearance and bowel sounds are normal. He exhibits no distension. There is no tenderness. There is no rebound and no CVA tenderness.  Genitourinary:  Genitourinary Comments: Foley catheter with pink-tinged urine with a small blood clots.  Musculoskeletal: Normal range of motion. He exhibits no edema or tenderness.  Neurological: He is alert. He displays atrophy. No cranial nerve deficit. GCS eye subscore is 4. GCS verbal subscore is 5. GCS motor subscore is 6.  Patient at baseline per daughter.  Skin: Skin is warm and dry. No abrasion and no rash noted.  Psychiatric: His affect is blunt. His speech is delayed. He is withdrawn.  Nursing note and vitals reviewed.    ED Treatments / Results  Labs (all labs ordered are listed, but only abnormal results are displayed) Labs Reviewed  URINE CULTURE  CBC WITH DIFFERENTIAL/PLATELET  BASIC METABOLIC PANEL  URINALYSIS, ROUTINE W REFLEX MICROSCOPIC    EKG None  Radiology No results found.  Procedures Procedures (including critical care time)  Medications Ordered in ED Medications - No data to display   Initial Impression / Assessment and Plan / ED Course  I have reviewed the triage vital signs and the nursing notes.  Pertinent labs & imaging results that were  available during my care of the patient were reviewed by me and considered in my medical decision making (see chart for details).     To be admitted for treatment of UTI as well as acute kidney injury.  Final Clinical Impressions(s) / ED Diagnoses   Final diagnoses:  None    ED Discharge Orders    None       Lacretia Leigh, MD 05/23/18 [phone]

## 2018-05-19 NOTE — H&P (Signed)
History and Physical  Neil [name] XBD:[id number] DOB: 12-09-1928 DOA: 05/19/2018 1850  Referring physician: Madison Hickman [facility] ED) PCP: Redmond School, MD  Outpatient Specialists: Jeffie Pollock (urology)  HISTORY   Chief Complaint: altered mentation from baseline, new hematuria  HPI: Neil [name] is a 84 y.o. male with hx of CKD stage 3, suprapubic catheter for urinary retention, PPM, CAD s/p CABG x 4, hx of heart block s/p PPM, CHF with HFrEF EF 15-20%, dementia who presented with delirium and altered mentation from baseline and new hematuria noted by family and home nurse. Patient's family noted increasing agitation for past several days and transient hematuria in foley bag a few days ago that self-resolved. On day of admission, noted significant bloody output in foley bag. Of note, patient was recently seen in 05/16/18 for volume overload which resolved with lasix 80mg  PO BID and was also prescribed bactrim x 7 day course for suspected UTI at that time (patient has been compliant with bactrim). Peripheral edema has since been well controlled. Daughter notified urology who recommended patient come to the ED for further evaluation. History obtained from daughter.    Review of Systems: + hematuria and altered mentation from baseline - no fevers/chills - no cough - no chest pain, dyspnea on exertion - no edema, PND, orthopnea - no nausea/vomiting; no tarry, melanotic or bloody stools - no weight changes  Rest of systems reviewed are negative, except as per above history.   ED course:  Vitals Blood pressure 129/69, pulse 77, temperature 97.7 F (36.5 C), temperature source Oral, resp. rate 17, height 5\' 8"  (1.727 m), weight 67.1 kg, SpO2 100 %. Received   Past Medical History:  Diagnosis Date  . Chronic kidney disease (CKD), stage III (moderate) (HCC)   . Chronic systolic CHF (congestive heart failure) ([place])   . Complete heart block ([place])   . Coronary artery disease    status post  [id number]  . Dementia ([place])   . Dyslipidemia   . Foley catheter in place   . Hypertension   . Ischemic cardiomyopathy    per last echo 11-10-2017  ef 15-20%  . Osteoarthritis   . Permanent atrial fibrillation   . Presence of permanent cardiac pacemaker cardiologist-  dr Samuel Bouche Jude status post generator replacement 2009 (original placement 02/ 1998)  . Renal artery stenosis (HCC)    95%  left, 50% right  . Requires assistance with activities of daily living (ADL)   . 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 Type 2 diabetes mellitus ([place])   . Urinary retention   . Uses wheelchair    Past Surgical History:  Procedure Laterality Date  .  suspicous skin lesions-nose and upper back-removed 04/15/18    . CARDIOVASCULAR STRESS TEST  05-22-2012   dr Caryl Comes   Low risk adenosine no exercise nuclear study w/ no ischemia/  ef 31%,  LV moderately enlarged , global hypokinesis and severe hypokinesis of the inferior wall  . CORONARY ANGIOPLASTY WITH STENT PLACEMENT  spring 1998   stenting  . CORONARY ARTERY BYPASS GRAFT  fall 1998   x4  . INSERTION OF SUPRAPUBIC CATHETER N/A 04/16/2018   Procedure: INSERTION OF SUPRAPUBIC CATHETER;  Surgeon: Irine Seal, MD;  Location: [facility];  Service: Urology;  Laterality: N/A;  . PACEMAKER GENERATOR CHANGE  10-30-2007  dr Samuel Bouche Jude dual chamber  . PACEMAKER INSERTION  02/ 1998   dr Caryl Comes  . skin lesions    .  TRANSTHORACIC ECHOCARDIOGRAM  11/10/2017   mild LVH, ef 15-20%, diffuse hypokinesis, LVDF not evaluated/  mild AS/  severe LAE/  RVSF severely reduced/  mild MR, PR, and TR/  PASP 32mmHg    Social History:  reports that he has never smoked. He has never used smokeless tobacco. He reports that he does not drink alcohol or use drugs.  Allergies  Allergen Reactions  . Cephalexin Other (See Comments)    hallucinations Other reaction(s): Delusions (intolerance)  . Penicillin G Itching  . Penicillins Swelling and Rash    Itching Has patient had a PCN reaction  causing immediate rash, facial/tongue/throat swelling, SOB or lightheadedness with hypotension: Yes Has patient had a PCN reaction causing severe rash involving mucus membranes or skin necrosis: Yes Has patient had a PCN reaction that required hospitalization: No Has patient had a PCN reaction occurring within the last 10 years: No If all of the above answers are "NO", then may proceed with Cephalosporin use.    Family History  Problem Relation Age of Onset  . Other Mother   . Cancer Mother        breast  . Alzheimer's disease Sister       Prior to Admission medications   Medication Sig Start Date End Date Taking? Authorizing Provider  albuterol (PROVENTIL) (2.5 MG/3ML) 0.083% nebulizer solution Take 3 mLs (2.5 mg total) by nebulization every 6 (six) hours as needed for wheezing or shortness of breath. 02/27/18  Yes Memon, Jolaine Artist, MD  ALPRAZolam (XANAX) 0.25 MG tablet Take 0.25 mg by mouth at bedtime.   Yes [provider]  atorvastatin (LIPITOR) 40 MG tablet Take 40 mg by mouth daily.     Yes [provider]  dabigatran (PRADAXA) 75 MG CAPS capsule Take 75 mg by mouth daily.   Yes [provider]  feeding supplement, ENSURE ENLIVE, (ENSURE ENLIVE) LIQD Take 237 mLs by mouth 2 (two) times daily between meals. 12/18/17  Yes Shah, Pratik D, DO  FEROSUL 325 (65 Fe) MG tablet Take 1 tablet by mouth daily. 03/14/18  Yes [provider]  furosemide (LASIX) 20 MG tablet Take 80 mg by mouth 2 (two) times daily.  03/12/18  Yes [provider]  glipiZIDE (GLUCOTROL XL) 2.5 MG 24 hr tablet Take 1 tablet by mouth every evening.  03/12/18  Yes [provider]  levothyroxine (SYNTHROID, LEVOTHROID) 25 MCG tablet Take 25 mcg by mouth daily. 04/13/18  Yes [provider]  Maltodextrin-Xanthan Gum (RESOURCE THICKENUP CLEAR) POWD Thicken fluids to nectar thick 02/27/18  Yes Memon, Jolaine Artist, MD  polyethylene glycol (MIRALAX) packet Take 17 g by mouth  daily. Patient taking differently: Take 17 g by mouth daily as needed for moderate constipation.  03/03/18  Yes Caryl Ada K, PA-C  rOPINIRole (REQUIP) 0.5 MG tablet Take 0.5 mg by mouth at bedtime.    Yes [provider]  sulfamethoxazole-trimethoprim (BACTRIM DS,SEPTRA DS) 800-160 MG tablet Take 1 tablet by mouth 2 (two) times daily.   Yes [provider]    PHYSICAL EXAM   Temp:  [97.7 F (36.5 C)-98.5 F (36.9 C)] 97.7 F (36.5 C) (11/10 2335) Pulse Rate:  [70-77] 77 (11/10 2335) Resp:  [15-18] 17 (11/10 2335) BP: (116-129)/(62-71) 129/69 (11/10 2335) SpO2:  [99 %-100 %] 100 % (11/10 2335) Weight:  [67.1 kg] 67.1 kg (11/10 1842)  BP 129/69 (BP Location: Right Arm)   Pulse 77   Temp 97.7 F (36.5 C) (Oral)   Resp 17  Ht 5\' 8"  (1.727 m)   Wt 67.1 kg   SpO2 100%   BMI 22.50 kg/m    GEN thin elderly caucasian male; resting in bed, comfortable  HEENT NCAT EOM intact PERRL; clear oropharynx, no cervical LAD; moist mucus membranes  JVP estimated 6-7 cm H2O above RA; no HJR ; no carotid bruits b/l ;  CV regular normal rate; normal S1 and S2; no m/r/g or S3/S4;  no parasternal heave  RESP CTA b/l; breathing unlabored and symmetric  ABD soft NT ND +normoactive BS; + suprapubic catheter in place, dressing clean dry and intact. In foley bag: bloody urine without clots and dark sediment EXT warm throughout b/l; no peripheral edema b/l  PULSES  DP and radials 2+ intact b/l  SKIN/MSK no rashes or lesions  NEURO/PSYCH AAOx1 (self); no focal motor deficits   DATA   LABS ON ADMISSION:  Basic Metabolic Panel: Recent Labs  Lab 05/15/18 2326 05/19/18 2018  NA 135 136  K 4.3 4.4  CL 98 100  CO2 27 26  GLUCOSE 242* 75  BUN 51* 51*  CREATININE 1.38* 2.03*  CALCIUM 9.2 8.9   CBC: Recent Labs  Lab 05/15/18 2326 05/19/18 2018  WBC 8.1 6.6  NEUTROABS 6.7 4.7  HGB 10.7* 10.8*  HCT 33.7* 34.7*  MCV 85.8 84.8  PLT 183 196   Liver Function  Tests: Recent Labs  Lab 05/15/18 2326  AST 19  ALT 10  ALKPHOS 73  BILITOT 1.3*  PROT 6.5  ALBUMIN 3.4*   No results for input(s): LIPASE, AMYLASE in the last 168 hours. No results for input(s): AMMONIA in the last 168 hours. Coagulation:  Lab Results  Component Value Date   INR 1.68 (H) 08/05/2010   INR 1.0 RATIO 10/24/2007   No results found for: PTT Lactic Acid, Venous:     Component Value Date/Time   LATICACIDVEN 1.51 05/15/2018 2346   Cardiac Enzymes: Recent Labs  Lab 05/15/18 2326  TROPONINI 0.04*   Urinalysis:    Component Value Date/Time   COLORURINE YELLOW 05/19/2018 2018   APPEARANCEUR HAZY (A) 05/19/2018 2018   LABSPEC 1.010 05/19/2018 2018   PHURINE 6.0 05/19/2018 2018   GLUCOSEU NEGATIVE 05/19/2018 2018   HGBUR LARGE (A) 05/19/2018 2018   [place] NEGATIVE 05/19/2018 2018   [place] NEGATIVE 05/19/2018 2018   PROTEINUR 30 (A) 05/19/2018 2018   NITRITE NEGATIVE 05/19/2018 2018   LEUKOCYTESUR TRACE (A) 05/19/2018 2018    BNP (last 3 results) No results for input(s): PROBNP in the last 8760 hours. CBG: No results for input(s): GLUCAP in the last 168 hours.  Radiological Exams on Admission: No results found.  EKG: none on admission. reviewed 05/16/18: afib and V paced rhythm   ASSESSMENT AND PLAN   Assessment: Neil [name] is a 84 y.o. male with hx of CKD stage 3, suprapubic catheter for urinary retention, PPM, CAD s/p CABG x 4, hx of heart block s/p PPM, CHF with HFrEF EF 15-20%, dementia who presented with delirium and altered mentation from baseline and new hematuria noted by family and home nurse. Labs also notable for AKI. Lactate normal at 1.51. Will treat for suspected UTI and discuss with urology in AM for further recommendations. Patient was recently started on bactrim last week for suspected UTI then.   Active Problems:   UTI (urinary tract infection)   Plan:   # UTI in setting of suprapubic catheter > + positive UA; prior  urine cx grew enterobacter and yeast  -  treating with IV ciprofloxacin - urine cultures pending  - will consult urology in AM (followed by [facility]) - strict ins/outs   # AKI on CKD with Cr 2.03 likely pre-renal and potentially intrinsic 2/2 infection > baseline Cr1.2-1.4 - abx to treat infection as above - cautious fluid resuscitation given CHF and propensity for vol overolad - monitor AM BMP - would resume home lasix if Cr improves   # Chronic afib - on pradaxa  - hold off on pradaxa given hematuria   - may resume on discharge as hematuria resolves  # Hx of CHF with HFrEF 15-20%. Recent volume overload episode last week > appears near euvolemia on exam  - already received 500cc bolus in ED and hemodynamically stable  - would not redose fluids unless unstable  - hold off on home coreg  - if Cr stable, resume home lasix 80mg  BID dosing  # Hx of restless legs and anxiety  - resume home ropinirole  - resume home xanax 0.25mg  atbedtime  # Hx of dyspnea/wheezing from ? COPD. No resp sx on admission  - prn albuterol nebs  # Chronic normocytic anemia likely chronic disease + renal > Hb 10.8, at baseline  - monitor daily CBC  # Hx of T2DM on glipizide at home  - holding off glipizide given normal sugars  - thin habitus; liberalize diet to regular     - if glucose elevated with AM labs, will start fingersticks with sliding scale  DVT Prophylaxis: SCD   Code Status:  Full Code Family Communication: daughter at bedside Disposition Plan: admit to observation; dc pending culture data and urology eval  Patient contact: Extended Emergency Contact Information Primary Emergency Contact: Guyett,[name] Address: [address]          [place], [place] [address] [place] Phone: [phone] Relation: Spouse Secondary Emergency Contact: Sherie Don, [place] [address] [place] Phone: [phone] Mobile Phone:  [phone] Relation: Daughter  Time spent: > 35 mins  Colbert Ewing, MD [facility] Pager [phone]  If 7PM-7AM, please contact night-coverage [email or web address] Password [email or web address] 05/19/2018, 11:42 PM

## 2018-05-19 NOTE — ED Notes (Signed)
ED Provider at bedside. 

## 2018-05-19 NOTE — ED Notes (Signed)
Patient given macaroni and cheese and diet coke.

## 2018-05-19 NOTE — Progress Notes (Signed)
PHARMACY NOTE -  ANTIBIOTIC RENAL DOSE ADJUSTMENT   Request received for Pharmacy to assist with antibiotic renal dose adjustment.  Patient has been initiated on Ciprofloxacin 400mg  iv q24hr for UTI. SCr 2.03, estimated CrCl 24 ml/min Current dosage is appropriate and need for further dosage adjustment appears unlikely at present. Will sign off at this time.  Please reconsult if a change in clinical status warrants re-evaluation of dosage.

## 2018-05-20 ENCOUNTER — Other Ambulatory Visit: Payer: Self-pay

## 2018-05-20 DIAGNOSIS — F0391 Unspecified dementia with behavioral disturbance: Secondary | ICD-10-CM

## 2018-05-20 DIAGNOSIS — N312 Flaccid neuropathic bladder, not elsewhere classified: Secondary | ICD-10-CM | POA: Diagnosis present

## 2018-05-20 DIAGNOSIS — Z951 Presence of aortocoronary bypass graft: Secondary | ICD-10-CM | POA: Diagnosis not present

## 2018-05-20 DIAGNOSIS — N179 Acute kidney failure, unspecified: Secondary | ICD-10-CM | POA: Diagnosis not present

## 2018-05-20 DIAGNOSIS — G9341 Metabolic encephalopathy: Secondary | ICD-10-CM | POA: Diagnosis present

## 2018-05-20 DIAGNOSIS — N390 Urinary tract infection, site not specified: Secondary | ICD-10-CM | POA: Diagnosis not present

## 2018-05-20 DIAGNOSIS — D631 Anemia in chronic kidney disease: Secondary | ICD-10-CM | POA: Diagnosis present

## 2018-05-20 DIAGNOSIS — F0390 Unspecified dementia without behavioral disturbance: Secondary | ICD-10-CM | POA: Diagnosis not present

## 2018-05-20 DIAGNOSIS — Z6821 Body mass index (BMI) 21.0-21.9, adult: Secondary | ICD-10-CM | POA: Diagnosis not present

## 2018-05-20 DIAGNOSIS — E1122 Type 2 diabetes mellitus with diabetic chronic kidney disease: Secondary | ICD-10-CM | POA: Diagnosis present

## 2018-05-20 DIAGNOSIS — I442 Atrioventricular block, complete: Secondary | ICD-10-CM | POA: Diagnosis present

## 2018-05-20 DIAGNOSIS — N3001 Acute cystitis with hematuria: Secondary | ICD-10-CM | POA: Diagnosis not present

## 2018-05-20 DIAGNOSIS — I4821 Permanent atrial fibrillation: Secondary | ICD-10-CM | POA: Diagnosis present

## 2018-05-20 DIAGNOSIS — G301 Alzheimer's disease with late onset: Secondary | ICD-10-CM | POA: Diagnosis not present

## 2018-05-20 DIAGNOSIS — N183 Chronic kidney disease, stage 3 (moderate): Secondary | ICD-10-CM | POA: Diagnosis present

## 2018-05-20 DIAGNOSIS — Z993 Dependence on wheelchair: Secondary | ICD-10-CM | POA: Diagnosis not present

## 2018-05-20 DIAGNOSIS — R339 Retention of urine, unspecified: Secondary | ICD-10-CM | POA: Diagnosis present

## 2018-05-20 DIAGNOSIS — I482 Chronic atrial fibrillation, unspecified: Secondary | ICD-10-CM | POA: Diagnosis not present

## 2018-05-20 DIAGNOSIS — T83518A Infection and inflammatory reaction due to other urinary catheter, initial encounter: Secondary | ICD-10-CM | POA: Diagnosis present

## 2018-05-20 DIAGNOSIS — E860 Dehydration: Secondary | ICD-10-CM | POA: Diagnosis present

## 2018-05-20 DIAGNOSIS — L89621 Pressure ulcer of left heel, stage 1: Secondary | ICD-10-CM | POA: Diagnosis present

## 2018-05-20 DIAGNOSIS — R627 Adult failure to thrive: Secondary | ICD-10-CM | POA: Diagnosis not present

## 2018-05-20 DIAGNOSIS — Y846 Urinary catheterization as the cause of abnormal reaction of the patient, or of later complication, without mention of misadventure at the time of the procedure: Secondary | ICD-10-CM | POA: Diagnosis present

## 2018-05-20 DIAGNOSIS — F0280 Dementia in other diseases classified elsewhere without behavioral disturbance: Secondary | ICD-10-CM | POA: Diagnosis not present

## 2018-05-20 DIAGNOSIS — I5020 Unspecified systolic (congestive) heart failure: Secondary | ICD-10-CM | POA: Diagnosis not present

## 2018-05-20 DIAGNOSIS — R319 Hematuria, unspecified: Secondary | ICD-10-CM | POA: Diagnosis present

## 2018-05-20 DIAGNOSIS — E039 Hypothyroidism, unspecified: Secondary | ICD-10-CM | POA: Diagnosis present

## 2018-05-20 DIAGNOSIS — R451 Restlessness and agitation: Secondary | ICD-10-CM | POA: Diagnosis present

## 2018-05-20 DIAGNOSIS — Z9359 Other cystostomy status: Secondary | ICD-10-CM | POA: Diagnosis not present

## 2018-05-20 DIAGNOSIS — Z515 Encounter for palliative care: Secondary | ICD-10-CM | POA: Diagnosis not present

## 2018-05-20 DIAGNOSIS — Z950 Presence of cardiac pacemaker: Secondary | ICD-10-CM | POA: Diagnosis not present

## 2018-05-20 DIAGNOSIS — I130 Hypertensive heart and chronic kidney disease with heart failure and stage 1 through stage 4 chronic kidney disease, or unspecified chronic kidney disease: Secondary | ICD-10-CM | POA: Diagnosis present

## 2018-05-20 DIAGNOSIS — L89611 Pressure ulcer of right heel, stage 1: Secondary | ICD-10-CM | POA: Diagnosis present

## 2018-05-20 DIAGNOSIS — E43 Unspecified severe protein-calorie malnutrition: Secondary | ICD-10-CM | POA: Diagnosis present

## 2018-05-20 DIAGNOSIS — I5022 Chronic systolic (congestive) heart failure: Secondary | ICD-10-CM | POA: Diagnosis present

## 2018-05-20 DIAGNOSIS — Z7189 Other specified counseling: Secondary | ICD-10-CM | POA: Diagnosis not present

## 2018-05-20 LAB — CBC WITH DIFFERENTIAL/PLATELET
ABS IMMATURE GRANULOCYTES: 0.02 10*3/uL (ref 0.00–0.07)
BASOS PCT: 1 %
Basophils Absolute: 0 10*3/uL (ref 0.0–0.1)
EOS ABS: 0.3 10*3/uL (ref 0.0–0.5)
Eosinophils Relative: 5 %
HCT: 30.4 % — ABNORMAL LOW (ref 39.0–52.0)
Hemoglobin: 9.5 g/dL — ABNORMAL LOW (ref 13.0–17.0)
IMMATURE GRANULOCYTES: 0 %
Lymphocytes Relative: 21 %
Lymphs Abs: 1.1 10*3/uL (ref 0.7–4.0)
MCH: 27.1 pg (ref 26.0–34.0)
MCHC: 31.3 g/dL (ref 30.0–36.0)
MCV: 86.9 fL (ref 80.0–100.0)
MONO ABS: 0.5 10*3/uL (ref 0.1–1.0)
Monocytes Relative: 9 %
NEUTROS ABS: 3.3 10*3/uL (ref 1.7–7.7)
NEUTROS PCT: 64 %
PLATELETS: 151 10*3/uL (ref 150–400)
RBC: 3.5 MIL/uL — AB (ref 4.22–5.81)
RDW: 18 % — AB (ref 11.5–15.5)
WBC: 5.1 10*3/uL (ref 4.0–10.5)
nRBC: 0 % (ref 0.0–0.2)

## 2018-05-20 LAB — GLUCOSE, CAPILLARY
GLUCOSE-CAPILLARY: 89 mg/dL (ref 70–99)
GLUCOSE-CAPILLARY: 102 mg/dL — AB (ref 70–99)
Glucose-Capillary: 62 mg/dL — ABNORMAL LOW (ref 70–99)
Glucose-Capillary: 85 mg/dL (ref 70–99)
Glucose-Capillary: 89 mg/dL (ref 70–99)

## 2018-05-20 LAB — MAGNESIUM: MAGNESIUM: 2.2 mg/dL (ref 1.7–2.4)

## 2018-05-20 LAB — BASIC METABOLIC PANEL
ANION GAP: 9 (ref 5–15)
BUN: 49 mg/dL — ABNORMAL HIGH (ref 8–23)
CHLORIDE: 102 mmol/L (ref 98–111)
CO2: 25 mmol/L (ref 22–32)
Calcium: 8.7 mg/dL — ABNORMAL LOW (ref 8.9–10.3)
Creatinine, Ser: 1.91 mg/dL — ABNORMAL HIGH (ref 0.61–1.24)
GFR, EST AFRICAN AMERICAN: 35 mL/min — AB (ref >60)
GFR, EST NON AFRICAN AMERICAN: 30 mL/min — AB (ref >60)
Glucose, Bld: 103 mg/dL — ABNORMAL HIGH (ref 70–99)
Potassium: 4.2 mmol/L (ref 3.5–5.1)
SODIUM: 136 mmol/L (ref 135–145)

## 2018-05-20 MED ORDER — HALOPERIDOL 1 MG PO TABS
1.0000 mg | ORAL_TABLET | ORAL | Status: DC | PRN
  Administered 2018-05-21 (×2): 1 mg via ORAL
  Filled 2018-05-20 (×3): qty 1

## 2018-05-20 MED ORDER — HALOPERIDOL LACTATE 5 MG/ML IJ SOLN
1.0000 mg | INTRAMUSCULAR | Status: DC | PRN
  Administered 2018-05-22: 1 mg via INTRAVENOUS
  Filled 2018-05-20: qty 1

## 2018-05-20 MED ORDER — RESOURCE THICKENUP CLEAR PO POWD
ORAL | Status: DC | PRN
  Filled 2018-05-20: qty 125

## 2018-05-20 MED ORDER — SODIUM CHLORIDE 0.9 % IV SOLN
1.0000 g | INTRAVENOUS | Status: DC
  Administered 2018-05-20 – 2018-05-21 (×2): 1 g via INTRAVENOUS
  Filled 2018-05-20: qty 1
  Filled 2018-05-20: qty 10
  Filled 2018-05-20: qty 1

## 2018-05-20 MED ORDER — HALOPERIDOL LACTATE 5 MG/ML IJ SOLN
1.0000 mg | INTRAMUSCULAR | Status: DC | PRN
  Administered 2018-05-20: 1 mg via INTRAMUSCULAR
  Filled 2018-05-20: qty 1

## 2018-05-20 MED ORDER — HALOPERIDOL 1 MG PO TABS
1.0000 mg | ORAL_TABLET | ORAL | Status: DC | PRN
  Filled 2018-05-20: qty 1

## 2018-05-20 NOTE — Progress Notes (Addendum)
PROGRESS NOTE    Neil [name]  [id number]:[id number] DOB: Jun 14, 1929 DOA: 05/19/2018 PCP: Redmond School, MD    Brief Narrative:  84 year old male who presented with altered mentation, more than baseline associated with hematuria.  He does have significant past medical history for chronic kidney disease stage III, chronic suprapubic catheter, coronary disease status post CABG, systolic heart failure and dementia.  Patient was noted to have worsening confusion, more than his baseline for the last several days, associated with hematuria.  Recent urine tract infection, treated with Bactrim.  On the initial physical examination blood pressure 129/69, heart rate 77, temperature 97.7, respirate 17, oxygen saturation 100%.  Lungs are clear to auscultation bilaterally, heart S1-S2 present and rhythmic, abdomen soft nontender, no lower extremity edema, patient was oriented only x1.  Sodium 136 6, potassium 4.4, chloride 100, bicarb 26, glucose 75, BUN 51, creatinine 2.0, white count 6.6, hemoglobin 10.8, hematocrit 34.7, platelets 196, urinalysis 11-20 white cells, more than 50 red cells  Patient was admitted to the hospital with the working diagnosis of metabolic encephalopathy due to urinary tract infection.  Assessment & Plan:   Active Problems:   UTI (urinary tract infection)   1. Metabolic encephalopathy due to urine tract infection (present on admission). Will continue antibiotic therapy with IV ceftriaxone, will follow on cultures, cell count and temperature curve. Continue hydration with IV fluids. This am with worsening agitation and features of delirium, will add as needed haldol.   2. AKI on CKD stage 3. Serum cr today at 1,91, with K at 4,2, will follow on renal panel in am, avoid hypotension and nephrotoxic medications.   3. Systolic heart failure. Will continue close monitoring of blood pressure, continue gentle IV fludis.   4. Chronic anemia. Multifactorial no indication for prbc  transfusion.   5. T2DM. Will continue insulin sliding scale for glucose cover and monitoring, patient is tolerating po well.   6. Dementia complicated with new features of delirium.  Advanced disease, will continue as needed haldol and will add olanzapine at night, continue neuro checks and fall precautions, patient's family at the bedside. Need to reconsider benefit vs risk of anticoagulation with primary cardiology as outpatient.  DVT prophylaxis: enoxaparin   Code Status: full Family Communication: I spoke with patient's family at the bedside and all questions were addressed.   Disposition Plan/ discharge barriers: pending clinical improvement   Body mass index is 21.49 kg/m. Malnutrition Type:      Malnutrition Characteristics:      Nutrition Interventions:     RN Pressure Injury Documentation:     Consultants:     Procedures:     Antimicrobials:       Subjective: Patient is confused and disoriented most information from his family at bedside  Objective: Vitals:   05/19/18 2335 05/20/18 0436 05/20/18 0553 05/20/18 1314  BP: 129/69 103/60  114/74  Pulse: 77 77  77  Resp: 17 17  16   Temp: 97.7 F (36.5 C) 97.8 F (36.6 C)  97.8 F (36.6 C)  TempSrc: Oral Oral  Oral  SpO2: 100% 98%  95%  Weight:   64.1 kg   Height:        Intake/Output Summary (Last 24 hours) at 05/20/2018 1536 Last data filed at 05/20/2018 1405 Gross per 24 hour  Intake -  Output 1050 ml  Net -1050 ml   Filed Weights   05/19/18 1842 05/20/18 0553  Weight: 67.1 kg 64.1 kg    Examination:   General:  deconditioned and ill looking appearing  Neurology:disorientated, and confused  E ENT: mild pallor, no icterus, oral mucosa moist Cardiovascular: No JVD. S1-S2 present, rhythmic, no gallops, rubs, or murmurs. No lower extremity edema. Pulmonary: positive breath sounds bilaterally, adequate air movement, no wheezing, rhonchi or rales. Gastrointestinal. Abdomen flat, no  organomegaly, non tender, no rebound or guarding Skin. No rashes Musculoskeletal: no joint deformities     Data Reviewed: I have personally reviewed following labs and imaging studies  CBC: Recent Labs  Lab 05/15/18 2326 05/19/18 2018 05/20/18 0509  WBC 8.1 6.6 5.1  NEUTROABS 6.7 4.7 3.3  HGB 10.7* 10.8* 9.5*  HCT 33.7* 34.7* 30.4*  MCV 85.8 84.8 86.9  PLT 183 196 244   Basic Metabolic Panel: Recent Labs  Lab 05/15/18 2326 05/19/18 2018 05/20/18 0509  NA 135 136 136  K 4.3 4.4 4.2  CL 98 100 102  CO2 27 26 25   GLUCOSE 242* 75 103*  BUN 51* 51* 49*  CREATININE 1.38* 2.03* 1.91*  CALCIUM 9.2 8.9 8.7*  MG  --   --  2.2   GFR: Estimated Creatinine Clearance: 24.7 mL/min (A) (by C-G formula based on SCr of 1.91 mg/dL (H)). Liver Function Tests: Recent Labs  Lab 05/15/18 2326  AST 19  ALT 10  ALKPHOS 73  BILITOT 1.3*  PROT 6.5  ALBUMIN 3.4*   No results for input(s): LIPASE, AMYLASE in the last 168 hours. No results for input(s): AMMONIA in the last 168 hours. Coagulation Profile: No results for input(s): INR, PROTIME in the last 168 hours. Cardiac Enzymes: Recent Labs  Lab 05/15/18 2326  TROPONINI 0.04*   BNP (last 3 results) No results for input(s): PROBNP in the last 8760 hours. HbA1C: No results for input(s): HGBA1C in the last 72 hours. CBG: Recent Labs  Lab 05/20/18 0042 05/20/18 0154 05/20/18 1402  GLUCAP 62* 89 89   Lipid Profile: No results for input(s): CHOL, HDL, LDLCALC, TRIG, CHOLHDL, LDLDIRECT in the last 72 hours. Thyroid Function Tests: No results for input(s): TSH, T4TOTAL, FREET4, T3FREE, THYROIDAB in the last 72 hours. Anemia Panel: No results for input(s): VITAMINB12, FOLATE, FERRITIN, TIBC, IRON, RETICCTPCT in the last 72 hours.    Radiology Studies: I have reviewed all of the imaging during this hospital visit personally     Scheduled Meds: . ALPRAZolam  0.25 mg Oral QHS  . docusate sodium  100 mg Oral BID  .  rOPINIRole  0.5 mg Oral QHS  . senna  1 tablet Oral BID   Continuous Infusions: . cefTRIAXone (ROCEPHIN)  IV       LOS: 0 days         Gerome Apley, MD [facility] Pager [phone]

## 2018-05-20 NOTE — Patient Outreach (Signed)
[facility]) Care Management  05/20/2018  Neil [name] 11-May-1929 [id number]   Received notification of [name]'s ED visit. Successful outreach with his caregiver [name]. She reported that Mr. Port was transported to the ED due to his foley catheter draining large amounts of bright red urine. Member and caregiver were in the ED/Observation Unit at the time of the call. [name] was agreeable to follow up and outreach later this week.   PLAN Will follow up and monitor for admission.  [place] [phone]

## 2018-05-20 NOTE — Progress Notes (Signed)
[facility]  Patient Status: Active (receiving services up to time of hospitalization)  [facility] is providing the following services: RN  If patient discharges after hours, please call [phone].   Neil [name] 05/20/2018, 9:23 AM

## 2018-05-20 NOTE — Care Management Note (Signed)
Case Management Note  Patient Details  Name: Neil [name] MRN: [id number] Date of Birth: 11/15/1928  Subjective/Objective:   Pt admitted with UTI                 Action/Plan: Plan to discharge home with family and [facility] for HRN/PT   Expected Discharge Date:   05/21/18               Expected Discharge Plan:  [place]  In-House Referral:     Discharge planning Services  CM Consult  Post Acute Care Choice:    Choice offered to:     DME Arranged:    DME Agency:     HH Arranged:  RN, PT [place] Agency:  [facility]  Status of Service:  Completed, signed off  If discussed at [place] of Stay Meetings, dates discussed:    Additional Comments:  Purcell Mouton, RN 05/20/2018, 11:13 AM

## 2018-05-20 NOTE — Progress Notes (Addendum)
PROGRESS NOTE    Neil [name]  [id number]:[id number] DOB: [birth year] DOA: 05/19/2018 PCP: Redmond School, MD (Confirm with patient/family/NH records and if not entered, this HAS to be entered at [facility] point of entry. "No PCP" if truly none.) Outpatient Specialists: (Specify speciality and name if known)    Brief Narrative: Neil [name] is 84 y.o patient admitted because of UTI and agitation. Has a history of CKD stage III, CAD with post status of CABG 4x, history of heart block, CHF with EF of 15-20 %, and dementia. He presented with increased agitation of several days and significant blood in foley. He was recently treated for volume overload with lasix and placed on bactrim for seven days for treatment of UTI.   Assessment & Plan:   Active Problems:   UTI (urinary tract infection)  Urinary tract infection: Patient presented with significant blood in foley, which has resolved now. Urine culture is pending. Continue on Ceftriaxone IV.  Dementia: Patient has been exhibiting increased agitation than usual. Placed him on haldol PO every 4 hours PRN.   Acute kidney disease on CKD: Secondary to hypoperfusion and infection. Creatinine is trending down 2.03-->1.91.His creatinine baseline is around 1.3. Continue monitoring creatinine levels and urine input/output.   Chronic afib: Continue off pradaxa until his hematuria completely resolved.   History of CHF with EF of 15-20%. No signs of volume overload. Monitor volume in put and output.   History of type 2 DM: Patient blood sugar has been fluctuating since admission  per daughter and nurse resort. CBG ordered.   History of restless legs syndrome: Continue on Requip PO at bedtime.   DVT prophylaxis: SCD Code Status: Full Family Communication: No family present at bedside.  Disposition Plan:  Consultants: None   Procedures: None Antimicrobials: Ciprofloxacin Subjective: Daughter reported increased agitation this morning. No blood in the  foley. No chest pain or dyspna. Foley catheter site looks clean with no infection.    Objective: Vitals:   05/19/18 2213 05/19/18 2335 05/20/18 0436 05/20/18 0553  BP: 116/64 129/69 103/60   Pulse: 77 77 77   Resp: 18 17 17    Temp:  97.7 F (36.5 C) 97.8 F (36.6 C)   TempSrc:  Oral Oral   SpO2: 100% 100% 98%   Weight:    64.1 kg  Height:        Intake/Output Summary (Last 24 hours) at 05/20/2018 1206 Last data filed at 05/20/2018 0548 Gross per 24 hour  Intake -  Output 450 ml  Net -450 ml   Filed Weights   05/19/18 1842 05/20/18 0553  Weight: 67.1 kg 64.1 kg    Examination:  General exam: Appears acutely ill, disorented Respiratory system: Clear to auscultation. Respiratory effort normal. Cardiovascular system: S1 & S2 heard, RRR. No JVD, murmurs, rubs, gallops or clicks. No pedal edema. Gastrointestinal system: Abdomen is nondistended, soft and nontender. No organomegaly or masses felt. Normal bowel sounds heard. Central nervous system: Alert and oriented. No focal neurological deficits. Extremities: Symmetric 5 x 5 power. Skin: No rashes, lesions or ulcers Psychiatry: disoriented/confused due to dementia. Mood & affect appropriate.     Data Reviewed: I have personally reviewed following labs and imaging studies  CBC: Recent Labs  Lab 05/15/18 2326 05/19/18 2018 05/20/18 0509  WBC 8.1 6.6 5.1  NEUTROABS 6.7 4.7 3.3  HGB 10.7* 10.8* 9.5*  HCT 33.7* 34.7* 30.4*  MCV 85.8 84.8 86.9  PLT 183 196 818   Basic Metabolic Panel: Recent Labs  Lab  05/15/18 2326 05/19/18 2018 05/20/18 0509  NA 135 136 136  K 4.3 4.4 4.2  CL 98 100 102  CO2 27 26 25   GLUCOSE 242* 75 103*  BUN 51* 51* 49*  CREATININE 1.38* 2.03* 1.91*  CALCIUM 9.2 8.9 8.7*  MG  --   --  2.2   GFR: Estimated Creatinine Clearance: 24.7 mL/min (A) (by C-G formula based on SCr of 1.91 mg/dL (H)). Liver Function Tests: Recent Labs  Lab 05/15/18 2326  AST 19  ALT 10  ALKPHOS 73    BILITOT 1.3*  PROT 6.5  ALBUMIN 3.4*   No results for input(s): LIPASE, AMYLASE in the last 168 hours. No results for input(s): AMMONIA in the last 168 hours. Coagulation Profile: No results for input(s): INR, PROTIME in the last 168 hours. Cardiac Enzymes: Recent Labs  Lab 05/15/18 2326  TROPONINI 0.04*   BNP (last 3 results) No results for input(s): PROBNP in the last 8760 hours. HbA1C: No results for input(s): HGBA1C in the last 72 hours. CBG: Recent Labs  Lab 05/20/18 0042 05/20/18 0154  GLUCAP 62* 89   Lipid Profile: No results for input(s): CHOL, HDL, LDLCALC, TRIG, CHOLHDL, LDLDIRECT in the last 72 hours. Thyroid Function Tests: No results for input(s): TSH, T4TOTAL, FREET4, T3FREE, THYROIDAB in the last 72 hours. Anemia Panel: No results for input(s): VITAMINB12, FOLATE, FERRITIN, TIBC, IRON, RETICCTPCT in the last 72 hours. Urine analysis:    Component Value Date/Time   COLORURINE YELLOW 05/19/2018 2018   APPEARANCEUR HAZY (A) 05/19/2018 2018   LABSPEC 1.010 05/19/2018 2018   PHURINE 6.0 05/19/2018 2018   GLUCOSEU NEGATIVE 05/19/2018 2018   HGBUR LARGE (A) 05/19/2018 2018   [place] NEGATIVE 05/19/2018 2018   [place] 05/19/2018 2018   PROTEINUR 30 (A) 05/19/2018 2018   NITRITE NEGATIVE 05/19/2018 2018   LEUKOCYTESUR TRACE (A) 05/19/2018 2018   Sepsis Labs: @LABRCNTIP (procalcitonin:4,lacticidven:4)  )No results found for this or any previous visit (from the past 240 hour(s)).       Radiology Studies: No results found.      Scheduled Meds: . ALPRAZolam  0.25 mg Oral QHS  . docusate sodium  100 mg Oral BID  . rOPINIRole  0.5 mg Oral QHS  . senna  1 tablet Oral BID   Continuous Infusions: . cefTRIAXone (ROCEPHIN)  IV       LOS: 0 days    Time spent:     Sterling Big, MD [facility] Pager [phone]  If 7PM-7AM, please contact night-coverage [email or web address] Password [email or web address] 05/20/2018, 12:06 PM

## 2018-05-21 DIAGNOSIS — I482 Chronic atrial fibrillation, unspecified: Secondary | ICD-10-CM

## 2018-05-21 LAB — CBC WITH DIFFERENTIAL/PLATELET
Abs Immature Granulocytes: 0.01 10*3/uL (ref 0.00–0.07)
BASOS ABS: 0 10*3/uL (ref 0.0–0.1)
BASOS PCT: 1 %
EOS ABS: 0.3 10*3/uL (ref 0.0–0.5)
EOS PCT: 6 %
HCT: 31.8 % — ABNORMAL LOW (ref 39.0–52.0)
Hemoglobin: 9.9 g/dL — ABNORMAL LOW (ref 13.0–17.0)
Immature Granulocytes: 0 %
Lymphocytes Relative: 20 %
Lymphs Abs: 1 10*3/uL (ref 0.7–4.0)
MCH: 26.5 pg (ref 26.0–34.0)
MCHC: 31.1 g/dL (ref 30.0–36.0)
MCV: 85 fL (ref 80.0–100.0)
MONO ABS: 0.4 10*3/uL (ref 0.1–1.0)
Monocytes Relative: 9 %
NRBC: 0 % (ref 0.0–0.2)
Neutro Abs: 3.3 10*3/uL (ref 1.7–7.7)
Neutrophils Relative %: 64 %
PLATELETS: 184 10*3/uL (ref 150–400)
RBC: 3.74 MIL/uL — AB (ref 4.22–5.81)
RDW: 18 % — AB (ref 11.5–15.5)
WBC: 5 10*3/uL (ref 4.0–10.5)

## 2018-05-21 LAB — GLUCOSE, CAPILLARY
GLUCOSE-CAPILLARY: 112 mg/dL — AB (ref 70–99)
GLUCOSE-CAPILLARY: 244 mg/dL — AB (ref 70–99)
GLUCOSE-CAPILLARY: 273 mg/dL — AB (ref 70–99)
GLUCOSE-CAPILLARY: 112 mg/dL — AB (ref 70–99)

## 2018-05-21 LAB — BASIC METABOLIC PANEL
ANION GAP: 9 (ref 5–15)
BUN: 42 mg/dL — ABNORMAL HIGH (ref 8–23)
CALCIUM: 8.9 mg/dL (ref 8.9–10.3)
CO2: 24 mmol/L (ref 22–32)
CREATININE: 1.74 mg/dL — AB (ref 0.61–1.24)
Chloride: 105 mmol/L (ref 98–111)
GFR calc non Af Amer: 34 mL/min — ABNORMAL LOW (ref >60)
GFR, EST AFRICAN AMERICAN: 39 mL/min — AB (ref >60)
Glucose, Bld: 98 mg/dL (ref 70–99)
Potassium: 3.7 mmol/L (ref 3.5–5.1)
SODIUM: 138 mmol/L (ref 135–145)

## 2018-05-21 LAB — URINE CULTURE: CULTURE: NO GROWTH

## 2018-05-21 LAB — MAGNESIUM: Magnesium: 2.4 mg/dL (ref 1.7–2.4)

## 2018-05-21 MED ORDER — DOCUSATE SODIUM 100 MG PO CAPS: 100.0000 mg | ORAL_CAPSULE | Freq: Every day | ORAL | Status: DC | PRN

## 2018-05-21 MED ORDER — DABIGATRAN ETEXILATE MESYLATE 75 MG PO CAPS
75.0000 mg | ORAL_CAPSULE | Freq: Two times a day (BID) | ORAL | Status: DC
  Administered 2018-05-21 (×2): 75 mg via ORAL
  Filled 2018-05-21 (×4): qty 1

## 2018-05-21 MED ORDER — LEVOTHYROXINE SODIUM 25 MCG PO TABS
25.0000 ug | ORAL_TABLET | Freq: Every day | ORAL | Status: DC
  Administered 2018-05-21 – 2018-05-22 (×2): 25 ug via ORAL
  Filled 2018-05-21 (×2): qty 1

## 2018-05-21 MED ORDER — SENNA 8.6 MG PO TABS: 1.0000 | ORAL_TABLET | Freq: Every day | ORAL | Status: DC | PRN

## 2018-05-21 MED ORDER — INSULIN ASPART 100 UNIT/ML ~~LOC~~ SOLN
0.0000 [IU] | Freq: Three times a day (TID) | SUBCUTANEOUS | Status: DC
  Administered 2018-05-21: 5 [IU] via SUBCUTANEOUS
  Administered 2018-05-22: 1 [IU] via SUBCUTANEOUS

## 2018-05-21 MED ORDER — OLANZAPINE 5 MG PO TABS
2.5000 mg | ORAL_TABLET | Freq: Every day | ORAL | Status: DC
  Administered 2018-05-21: 2.5 mg via ORAL
  Filled 2018-05-21: qty 1

## 2018-05-21 NOTE — Plan of Care (Signed)
Family is feeding pt every few hours. Pt had a BM this shift.

## 2018-05-21 NOTE — Consult Note (Signed)
   [facility] CM Inpatient Consult   05/21/2018  Neil [name] [birth year] [id number]    Neil [name] is active with [place] Management program. He is followed by [facility] who is aware of admission.   Went to bedside to speak with patient/family. Neil [name] was asleep and Neil [name] was asleep at bedside. Did not want to disturb.   Will make inpatient RNCM aware that patient is active with [place] Management services.  Will continue to follow for progression and disposition plans.    Marthenia Rolling, MSN-Ed, RN,BSN [facility] Liaison [phone]

## 2018-05-21 NOTE — Progress Notes (Signed)
PROGRESS NOTE    Neil [name]  CBS:[id number] DOB: 1929-05-04 DOA: 05/19/2018 PCP: Redmond School, MD    Brief Narrative:  84 year old male who presented with altered mentation, more than baseline associated with hematuria.  He does have significant past medical history for chronic kidney disease stage III, chronic suprapubic catheter, coronary disease status post CABG, systolic heart failure and dementia.  Patient was noted to have worsening confusion, more than his baseline for the last several days, associated with hematuria.  Recent urine tract infection, treated with Bactrim.  On the initial physical examination blood pressure 129/69, heart rate 77, temperature 97.7, respirate 17, oxygen saturation 100%.  Lungs are clear to auscultation bilaterally, heart S1-S2 present and rhythmic, abdomen soft nontender, no lower extremity edema, patient was oriented only x1.  Sodium 136 6, potassium 4.4, chloride 100, bicarb 26, glucose 75, BUN 51, creatinine 2.0, white count 6.6, hemoglobin 10.8, hematocrit 34.7, platelets 196, urinalysis 11-20 white cells, more than 50 red cells. EKG with left axis deviation, right bundle branch block, poor R wave progression, atrial fibrillation.  Patient was admitted to the hospital with the working diagnosis of metabolic encephalopathy due to urinary tract infection.   Assessment & Plan:   Active Problems:   UTI (urinary tract infection)    1. Metabolic encephalopathy due to urinary tract infection (present on admission). Continue  IV ceftriaxone, for antibiotic therapy, cultures have been no growth, no further hematuria. Stable wbc at 5,0. Patient responding well to haldol as needed, more symptoms at night, will add low dose olanzapine at night for sleep. Patient did not tolerate trazodone in the past with worsening agitation.    2. AKI on CKD stage 3.  Patient has been tolerating po well, will hold on IV fluids, follow a restrictive IV fluids strategy.  Renal function today with serum cr at 1,74 with K at 3,7 and serum bicarbonate at 24. follow on renal panel in am.   3. Systolic heart failure. Known left ventricle ejection fraction of 15 to 20%, with diffuse hypokinesis, will continue blood pressure monitoring, discontinue IV fluids.    4. Chronic anemia. Multifactorial. Hb stable at 9,9 with Hct at 31.8.   5. T2DM. Insulin sliding scale for glucose cover and monitoring, patient is tolerating po well. Fasting glucose today 98.   6.  Chronic atrial fibrillation. Rate has remained controlled, will resume anticoagulation with dabigatran.    7. Hypothyroid. Resume levothyroxine.   DVT prophylaxis: enoxaparin   Code Status: full Family Communication: I spoke with patient's family at the bedside and all questions were addressed.   Disposition Plan/ discharge barriers: pending clinical improvement   Body mass index is 21.49 kg/m. Malnutrition Type:      Malnutrition Characteristics:      Nutrition Interventions:     RN Pressure Injury Documentation:     Consultants:     Procedures:     Antimicrobials:   Ceftriaxone IV     Subjective: Patient has been agitated and confuses, specially at night, no nausea or vomiting, no chest pain or dyspnea. Most information from patient's family at the bedside, limited history from patient due to dementia.   Objective: Vitals:   05/20/18 0553 05/20/18 1314 05/20/18 2231 05/21/18 0518  BP:  114/74 (!) 119/56 109/61  Pulse:  77 77 78  Resp:  16 12 14   Temp:  97.8 F (36.6 C) 97.8 F (36.6 C) 98.4 F (36.9 C)  TempSrc:  Oral Oral Oral  SpO2:  95% 98%  98%  Weight: 64.1 kg     Height:        Intake/Output Summary (Last 24 hours) at 05/21/2018 1217 Last data filed at 05/21/2018 0521 Gross per 24 hour  Intake 800 ml  Output 1650 ml  Net -850 ml   Filed Weights   05/19/18 1842 05/20/18 0553  Weight: 67.1 kg 64.1 kg    Examination:   General: deconditioned    Neurology: Awake and alert, confused and disorientated.  E ENT: mild pallor, no icterus, oral mucosa moist Cardiovascular: No JVD. S1-S2 present, rhythmic, no gallops, rubs, or murmurs. No lower extremity edema. Pulmonary: vesicular breath sounds bilaterally, adequate air movement, no wheezing, rhonchi or rales. Gastrointestinal. Abdomen with no organomegaly, non tender, no rebound or guarding Skin. No rashes Musculoskeletal: no joint deformities     Data Reviewed: I have personally reviewed following labs and imaging studies  CBC: Recent Labs  Lab 05/15/18 2326 05/19/18 2018 05/20/18 0509 05/21/18 0517  WBC 8.1 6.6 5.1 5.0  NEUTROABS 6.7 4.7 3.3 3.3  HGB 10.7* 10.8* 9.5* 9.9*  HCT 33.7* 34.7* 30.4* 31.8*  MCV 85.8 84.8 86.9 85.0  PLT 183 196 151 867   Basic Metabolic Panel: Recent Labs  Lab 05/15/18 2326 05/19/18 2018 05/20/18 0509 05/21/18 0517  NA 135 136 136 138  K 4.3 4.4 4.2 3.7  CL 98 100 102 105  CO2 27 26 25 24   GLUCOSE 242* 75 103* 98  BUN 51* 51* 49* 42*  CREATININE 1.38* 2.03* 1.91* 1.74*  CALCIUM 9.2 8.9 8.7* 8.9  MG  --   --  2.2 2.4   GFR: Estimated Creatinine Clearance: 27.1 mL/min (A) (by C-G formula based on SCr of 1.74 mg/dL (H)). Liver Function Tests: Recent Labs  Lab 05/15/18 2326  AST 19  ALT 10  ALKPHOS 73  BILITOT 1.3*  PROT 6.5  ALBUMIN 3.4*   No results for input(s): LIPASE, AMYLASE in the last 168 hours. No results for input(s): AMMONIA in the last 168 hours. Coagulation Profile: No results for input(s): INR, PROTIME in the last 168 hours. Cardiac Enzymes: Recent Labs  Lab 05/15/18 2326  TROPONINI 0.04*   BNP (last 3 results) No results for input(s): PROBNP in the last 8760 hours. HbA1C: No results for input(s): HGBA1C in the last 72 hours. CBG: Recent Labs  Lab 05/20/18 1402 05/20/18 1643 05/20/18 2233 05/21/18 0746 05/21/18 1158  GLUCAP 89 85 102* 112* 244*   Lipid Profile: No results for input(s): CHOL,  HDL, LDLCALC, TRIG, CHOLHDL, LDLDIRECT in the last 72 hours. Thyroid Function Tests: No results for input(s): TSH, T4TOTAL, FREET4, T3FREE, THYROIDAB in the last 72 hours. Anemia Panel: No results for input(s): VITAMINB12, FOLATE, FERRITIN, TIBC, IRON, RETICCTPCT in the last 72 hours.    Radiology Studies: I have reviewed all of the imaging during this hospital visit personally     Scheduled Meds: . ALPRAZolam  0.25 mg Oral QHS  . dabigatran  75 mg Oral Q12H  . docusate sodium  100 mg Oral BID  . levothyroxine  25 mcg Oral Q0600  . OLANZapine  2.5 mg Oral QHS  . rOPINIRole  0.5 mg Oral QHS  . senna  1 tablet Oral BID   Continuous Infusions: . cefTRIAXone (ROCEPHIN)  IV 1 g (05/20/18 2200)     LOS: 1 day        Velora Horstman Gerome Apley, MD [facility] Pager [phone]

## 2018-05-21 NOTE — Progress Notes (Signed)
Pt ambulated to the door approximately 100 feet with walker. Care giver and spouse at bedside. SRP, RN

## 2018-05-21 NOTE — Progress Notes (Signed)
Attempted to ambulate pt with walker, pt stood unable to take footsteps, returned to bed. Pt appear weak and unsteady with caregiver at bedside to assist. SRP, RN

## 2018-05-22 DIAGNOSIS — N179 Acute kidney failure, unspecified: Secondary | ICD-10-CM

## 2018-05-22 DIAGNOSIS — N390 Urinary tract infection, site not specified: Secondary | ICD-10-CM

## 2018-05-22 DIAGNOSIS — R627 Adult failure to thrive: Secondary | ICD-10-CM

## 2018-05-22 DIAGNOSIS — R319 Hematuria, unspecified: Secondary | ICD-10-CM

## 2018-05-22 DIAGNOSIS — Z515 Encounter for palliative care: Secondary | ICD-10-CM

## 2018-05-22 DIAGNOSIS — F0390 Unspecified dementia without behavioral disturbance: Secondary | ICD-10-CM

## 2018-05-22 DIAGNOSIS — I5022 Chronic systolic (congestive) heart failure: Secondary | ICD-10-CM

## 2018-05-22 DIAGNOSIS — G301 Alzheimer's disease with late onset: Secondary | ICD-10-CM

## 2018-05-22 DIAGNOSIS — F0280 Dementia in other diseases classified elsewhere without behavioral disturbance: Secondary | ICD-10-CM

## 2018-05-22 DIAGNOSIS — I5020 Unspecified systolic (congestive) heart failure: Secondary | ICD-10-CM

## 2018-05-22 DIAGNOSIS — Z7189 Other specified counseling: Secondary | ICD-10-CM

## 2018-05-22 DIAGNOSIS — Z9359 Other cystostomy status: Secondary | ICD-10-CM

## 2018-05-22 LAB — GLUCOSE, CAPILLARY
GLUCOSE-CAPILLARY: 101 mg/dL — AB (ref 70–99)
Glucose-Capillary: 148 mg/dL — ABNORMAL HIGH (ref 70–99)
Glucose-Capillary: 119 mg/dL — ABNORMAL HIGH (ref 70–99)

## 2018-05-22 LAB — BASIC METABOLIC PANEL
Anion gap: 8 (ref 5–15)
BUN: 29 mg/dL — AB (ref 8–23)
CHLORIDE: 105 mmol/L (ref 98–111)
CO2: 24 mmol/L (ref 22–32)
CREATININE: 1.4 mg/dL — AB (ref 0.61–1.24)
Calcium: 9 mg/dL (ref 8.9–10.3)
GFR calc Af Amer: 51 mL/min — ABNORMAL LOW (ref >60)
GFR calc non Af Amer: 44 mL/min — ABNORMAL LOW (ref >60)
GLUCOSE: 125 mg/dL — AB (ref 70–99)
Potassium: 4.2 mmol/L (ref 3.5–5.1)
Sodium: 137 mmol/L (ref 135–145)

## 2018-05-22 LAB — CBC WITH DIFFERENTIAL/PLATELET
ABS IMMATURE GRANULOCYTES: 0.01 10*3/uL (ref 0.00–0.07)
BASOS PCT: 1 %
Basophils Absolute: 0 10*3/uL (ref 0.0–0.1)
Eosinophils Absolute: 0.3 10*3/uL (ref 0.0–0.5)
Eosinophils Relative: 4 %
HCT: 31.8 % — ABNORMAL LOW (ref 39.0–52.0)
HEMOGLOBIN: 10.1 g/dL — AB (ref 13.0–17.0)
IMMATURE GRANULOCYTES: 0 %
LYMPHS PCT: 22 %
Lymphs Abs: 1.3 10*3/uL (ref 0.7–4.0)
MCH: 26.9 pg (ref 26.0–34.0)
MCHC: 31.8 g/dL (ref 30.0–36.0)
MCV: 84.8 fL (ref 80.0–100.0)
MONO ABS: 0.4 10*3/uL (ref 0.1–1.0)
MONOS PCT: 7 %
NEUTROS ABS: 3.9 10*3/uL (ref 1.7–7.7)
NEUTROS PCT: 66 %
PLATELETS: 164 10*3/uL (ref 150–400)
RBC: 3.75 MIL/uL — ABNORMAL LOW (ref 4.22–5.81)
RDW: 17.7 % — ABNORMAL HIGH (ref 11.5–15.5)
WBC: 5.9 10*3/uL (ref 4.0–10.5)
nRBC: 0 % (ref 0.0–0.2)

## 2018-05-22 LAB — MAGNESIUM: Magnesium: 2.2 mg/dL (ref 1.7–2.4)

## 2018-05-22 MED ORDER — CEFPODOXIME PROXETIL 200 MG PO TABS
200.0000 mg | ORAL_TABLET | Freq: Two times a day (BID) | ORAL | Status: DC
  Filled 2018-05-22: qty 1

## 2018-05-22 MED ORDER — FUROSEMIDE 20 MG PO TABS: 40.0000 mg | ORAL_TABLET | ORAL | 0 refills | Status: DC | PRN

## 2018-05-22 MED ORDER — CEFPODOXIME PROXETIL 200 MG PO TABS: 200.0000 mg | ORAL_TABLET | Freq: Two times a day (BID) | ORAL | 0 refills | Status: AC

## 2018-05-22 MED ORDER — LORAZEPAM 2 MG/ML IJ SOLN
0.5000 mg | Freq: Once | INTRAMUSCULAR | Status: AC
  Administered 2018-05-22: 0.5 mg via INTRAVENOUS
  Filled 2018-05-22: qty 1

## 2018-05-22 MED ORDER — ADULT MULTIVITAMIN W/MINERALS CH: 1.0000 | ORAL_TABLET | Freq: Every day | ORAL | Status: DC

## 2018-05-22 MED ORDER — DOCUSATE SODIUM 100 MG PO CAPS: 100.0000 mg | ORAL_CAPSULE | Freq: Every day | ORAL | Status: DC | PRN

## 2018-05-22 MED ORDER — ENSURE ENLIVE PO LIQD: 237.0000 mL | Freq: Two times a day (BID) | ORAL | Status: DC

## 2018-05-22 NOTE — Progress Notes (Signed)
Spoke with pt's daughter at bedside concerning discharge needs. There are no other needs at this present time. [facility] was called to make aware of pt's discharge.

## 2018-05-22 NOTE — Evaluation (Signed)
SLP Cancellation Note  Patient Details Name: Neil [name] MRN: [id number] DOB: 1928-07-20   Cancelled treatment:       Reason Eval/Treat Not Completed: Other (comment)(spoke to RN who reports pt in restraints and does not have appropriate mentation for swallow eval this afternoon, will follow up next date am)   Macario Golds 05/22/2018, 3:42 PM   Luanna Salk, [place] [facility] SLP [place] Pager [phone] Office [phone]

## 2018-05-22 NOTE — Progress Notes (Signed)
PT Cancellation Note  Patient Details Name: Neil [name] MRN: [id number] DOB: [birth year]   Cancelled Treatment:    Reason Eval/Treat Not Completed: Medical issues which prohibited therapy Pt's cognition declined today per RN.  Pt in mittens and restraints.  RN reports not safe to mobilize at this time.  Will check back as schedule permits.   Joretta Eads,KATHrine E 05/22/2018, 10:58 AM Carmelia Bake, PT, DPT Acute Rehabilitation Services Office: [phone] Pager: [phone]

## 2018-05-22 NOTE — Progress Notes (Signed)
Initial Nutrition Assessment  DOCUMENTATION CODES:   Severe malnutrition in context of acute illness/injury, Non-severe (moderate) malnutrition in context of chronic illness  INTERVENTION:  - Will order Ensure Enlive po BID, each supplement provides 350 kcal and 20 grams of protein - Continue to encourage PO intakes.  - Feeding assistance needed.   NUTRITION DIAGNOSIS:   Severe Malnutrition related to acute illness, chronic illness(UTIs, dementia) as evidenced by moderate fat depletion, severe muscle depletion, percent weight loss.  GOAL:   Patient will meet greater than or equal to 90% of their needs  MONITOR:   PO intake, Supplement acceptance, Weight trends, Labs, Skin, I & O's  REASON FOR ASSESSMENT:   Consult Assessment of nutrition requirement/status  ASSESSMENT:   84 year old male who presented with altered mentation, more than baseline, and hematuria.  He does have significant past medical history for CKD stage III, chronic suprapubic catheter, CAD s/p CABG, CHF, and dementia. Recent urine tract infection, treated with Bactrim.  Patient was admitted to the hospital with the working diagnosis of metabolic encephalopathy due to UTI.  BMI indicates normal weight. Patient noted to be agitated during RD visit, moving around in bed. Bilateral mitten restraints in place. Flow sheet indicates that he is a/o to self only. Patient's wife, daughter, and granddaughter were at bedside. His daughter provided all information.  She confirms that patient was too agitated to be provided with breakfast this AM. She reports that this is not his normal and that breakfast is typically his best meal of the day. He needs thickened liquids, all foods are prepared at home and are free of artificial flavors, dyes, and sugar substitutes.   She reports that breakfast is his best meal and that he typically has 3 eggs, 2 pieces of [place] bacon, fresh fruit, a glass of V-8 Splash, and a bottle of Ensure  Enlive. He does eat lunch and dinner, but those meals are smaller than breakfast.   Per chart review, current weight is 141 lb and weight on 03/21/18 was 155 lb. This indicates 14 lb weight loss (9% body weight) in the past 2 months.   Medications reviewed; sliding scale Novolog, 25 mcg oral Synthroid/day. Labs reviewed; CBG: 101 mg/dL, BUN: 29 mg/dL, creatinine: 1.4 mg/dL, GFR: 44 mL/min.      NUTRITION - FOCUSED PHYSICAL EXAM:    Most Recent Value  Orbital Region  Mild depletion  Upper Arm Region  Moderate depletion  Thoracic and Lumbar Region  Unable to assess  Buccal Region  Moderate depletion  Temple Region  Moderate depletion  Clavicle Bone Region  Moderate depletion  Clavicle and Acromion Bone Region  Severe depletion  Scapular Bone Region  Unable to assess  Dorsal Hand  Unable to assess [bilateral mittens]  Patellar Region  Severe depletion  Anterior Thigh Region  Severe depletion  Posterior Calf Region  Severe depletion  Edema (RD Assessment)  Mild [BLE]  Hair  Reviewed  Eyes  Reviewed  Mouth  Reviewed  Skin  Reviewed  Nails  Unable to assess       Diet Order:   Diet Order            Diet regular Room service appropriate? Yes; Fluid consistency: Thin  Diet effective now              EDUCATION NEEDS:   No education needs have been identified at this time  Skin:  Skin Assessment: Skin Integrity Issues: Skin Integrity Issues:: Stage I Stage I: bilateral heels  Last BM:  11/13  Height:   Ht Readings from Last 1 Encounters:  05/19/18 5\' 8"  (1.727 m)    Weight:   Wt Readings from Last 1 Encounters:  05/20/18 64.1 kg    Ideal Body Weight:  70 kg  BMI:  Body mass index is 21.49 kg/m.  Estimated Nutritional Needs:   Kcal:  9485-4627  Protein:  75-85 grams  Fluid:  >/= 1.8 L/day     Jarome Matin, MS, RD, LDN, [facility] Inpatient Clinical Dietitian Pager # [phone] After hours/weekend pager # [phone]

## 2018-05-22 NOTE — Discharge Summary (Addendum)
Discharge Summary  Neil [name] DGL:[id number] DOB: March 11, 1929  PCP: Redmond School, MD  Admit date: 05/19/2018 Discharge date: 05/22/2018  Time spent: 79mins, more than 50% time spent on coordination of care.  Recommendations for Outpatient Follow-up:  1. F/u with PCP within a week  for hospital discharge follow up, repeat cbc/bmp at follow up. pcp to refer to palliative care to continue goals of care discussion 2. F/u with neurology for dementia management 3. Maximize home health  Discharge Diagnoses:  Active Hospital Problems   Diagnosis Date Noted  . Systolic congestive heart failure ([place])   . Palliative care encounter   . Goals of care, counseling/discussion   . UTI (urinary tract infection) 02/25/2018    Resolved Hospital Problems  No resolved problems to display.    Discharge Condition: stable  Diet recommendation: dysphagia diet, aspiration precaution  Filed Weights   05/19/18 1842 05/20/18 0553  Weight: 67.1 kg 64.1 kg    History of present illness: (per admitting MD Dr Hampton Abbot) Chief Complaint: altered mentation from baseline, new hematuria  HPI: Neil [name] is a 84 y.o. male with hx of CKD stage 3, suprapubic catheter for urinary retention, PPM, CAD s/p CABG x 4, hx of heart block s/p PPM, CHF with HFrEF EF 15-20%, dementia who presented with delirium and altered mentation from baseline and new hematuria noted by family and home nurse. Patient's family noted increasing agitation for past several days and transient hematuria in foley bag a few days ago that self-resolved. On day of admission, noted significant bloody output in foley bag. Of note, patient was recently seen in 05/16/18 for volume overload which resolved with lasix 80mg  PO BID and was also prescribed bactrim x 7 day course for suspected UTI at that time (patient has been compliant with bactrim). Peripheral edema has since been well controlled. Daughter notified urology who recommended patient  come to the ED for further evaluation. History obtained from daughter.   Hospital Course:  Active Problems:   UTI (urinary tract infection)   Systolic congestive heart failure [facility])   Palliative care encounter   Goals of care, counseling/discussion   Acute Metabolic encephalopathy due to urinary tract infection in the setting of suprapubic catheter (present on admission).  --urine culture no growth, he received  IV ceftriaxone, hematuria has resolved, urine clear -he is discharged on vantin to finish total of 7 days abx treatment -it felt  Patient will do better at home now infection is treated and urine is clear, labs has improved, family states patient has 24/7 care at home, they agrees to take patient home and follow up with pcp and palliative care in the community to continue goals of care due to underline progressive dementia and intermittent hallucination      AKI on CKD stage 3.   Home  meds lasix held, he received gentle hydration, ivf discontinued due to advanced chf Cr 2.03 on presentation, cr improved back to baseline around 1.4 at discharge Home lasix changed to q48hrs prn for edema at discharge He is to follow up with pcp, repeat bmp at discharge   H/o chronic afib/complete heart block s/p pacemaker placement H/o Chronic Systolic heart failure.  Known left ventricle ejection fraction of 15 to 20%, with diffuse hypokinesis, He presented with dehydration, home lasix held in the hospital, resume q48hrs prn for edema He reports was taken off coregby pcp, he is to follow up with pcp.   He is continued on pradaxa  .  Anemia of chronic disease.  Hb stable at 9.9 with Hct at 31.8.   noninsulin dependent T2DM.  Fasting glucose today 98. continue home meds glipizide prn, follow up with pcp   Hypothyroid. Resume levothyroxine.   h/o chronic urinary retention with a hypotonic bladder  Was managed by a indwelling foley in the past, he underwent suprapubic catheter  placement in 04/2018 by urology Dr Jeffie Pollock due to " chronic urinary retention with a hypotonic bladder who is been managed with a Foley catheter.  He has dementia and is been pulling at the catheter and it was felt that the suprapubic tube would be a better option" He is to follow up with urology  Advanced dementia: Family reports patient has been dependent due to progressive confusion, he need 24/7 care since 11/2017, he is mostly wheelchair bound due to progressive weakness and risk of falls. He has a hospital bed at home Over all poor prognosis, palliative care consulted, remain full code Family decided to take patient home and continue follow up with palliative care in the community.  Overall poor prognosis, at risk of rehospitalization due to progressive dementia, continue goals of care discussion per pcp, maximize home health  RN Pressure Injury Documentation: Pressure Injury 05/21/18 Stage I -  Intact skin with non-blanchable redness of a localized area usually over a bony prominence. blanchable-- (Active)  05/21/18 0845   Location: Heel  Location Orientation: Right;Left  Staging: Stage I -  Intact skin with non-blanchable redness of a localized area usually over a bony prominence.  Wound Description (Comments): blanchable--  Present on Admission: Yes    Malnutrition Type:  Nutrition Problem: Severe Malnutrition Etiology: acute illness, chronic illness(UTIs, dementia)   Malnutrition Characteristics:  Signs/Symptoms: moderate fat depletion, severe muscle depletion, percent weight loss Percent weight loss: 9 %   Nutrition Interventions:  Interventions: Ensure Enlive (each supplement provides 350kcal and 20 grams of protein), MVI     DVT prophylaxis:enoxaparin Code Status:full Family Communication:I spoke with patient's family at the bedside and all questions were addressed.  Procedures:  none  Consultations:  Palliative care  Discharge Exam: BP 133/76 (BP  Location: Left Arm)   Pulse 69   Temp 98.4 F (36.9 C) (Oral)   Resp 16   Ht 5\' 8"  (1.727 m)   Wt 64.1 kg   SpO2 97%   BMI 21.49 kg/m   General: alert, only oriented to self Cardiovascular: paced rhythm Respiratory: CTABL  Discharge Instructions You were cared for by a hospitalist during your hospital stay. If you have any questions about your discharge medications or the care you received while you were in the hospital after you are discharged, you can call the unit and asked to speak with the hospitalist on call if the hospitalist that took care of you is not available. Once you are discharged, your primary care physician will handle any further medical issues. Please note that NO REFILLS for any discharge medications will be authorized once you are discharged, as it is imperative that you return to your primary care physician (or establish a relationship with a primary care physician if you do not have one) for your aftercare needs so that they can reassess your need for medications and monitor your lab values.  Discharge Instructions    Diet general   Complete by:  As directed    Aspiration precaution   Increase activity slowly   Complete by:  As directed      Allergies as of 05/22/2018  Reactions   Cephalexin Other (See Comments)   hallucinations Other reaction(s): Delusions (intolerance)   Penicillin G Itching   Penicillins Swelling, Rash   Itching Has patient had a PCN reaction causing immediate rash, facial/tongue/throat swelling, SOB or lightheadedness with hypotension: Yes Has patient had a PCN reaction causing severe rash involving mucus membranes or skin necrosis: Yes Has patient had a PCN reaction that required hospitalization: No Has patient had a PCN reaction occurring within the last 10 years: No If all of the above answers are "NO", then may proceed with Cephalosporin use.      Medication List    STOP taking these medications     sulfamethoxazole-trimethoprim 800-160 MG tablet Commonly known as:  BACTRIM DS,SEPTRA DS     TAKE these medications   albuterol (2.5 MG/3ML) 0.083% nebulizer solution Commonly known as:  PROVENTIL Take 3 mLs (2.5 mg total) by nebulization every 6 (six) hours as needed for wheezing or shortness of breath.   ALPRAZolam 0.25 MG tablet Commonly known as:  XANAX Take 0.25 mg by mouth at bedtime.   atorvastatin 40 MG tablet Commonly known as:  LIPITOR Take 40 mg by mouth daily.   cefpodoxime 200 MG tablet Commonly known as:  VANTIN Take 1 tablet (200 mg total) by mouth every 12 (twelve) hours for 5 days.   dabigatran 75 MG Caps capsule Commonly known as:  PRADAXA Take 75 mg by mouth daily.   feeding supplement (ENSURE ENLIVE) Liqd Take 237 mLs by mouth 2 (two) times daily between meals.   FEROSUL 325 (65 FE) MG tablet Generic drug:  ferrous sulfate Take 1 tablet by mouth daily.   furosemide 20 MG tablet Commonly known as:  LASIX Take 2 tablets (40 mg total) by mouth every other day as needed for fluid or edema. What changed:    how much to take  when to take this  reasons to take this   glipiZIDE 2.5 MG 24 hr tablet Commonly known as:  GLUCOTROL XL Take 1 tablet by mouth every evening.   levothyroxine 25 MCG tablet Commonly known as:  SYNTHROID, LEVOTHROID Take 25 mcg by mouth daily.   polyethylene glycol packet Commonly known as:  MIRALAX / GLYCOLAX Take 17 g by mouth daily. What changed:    when to take this  reasons to take this   [place] Thicken fluids to nectar thick   rOPINIRole 0.5 MG tablet Commonly known as:  REQUIP Take 0.5 mg by mouth at bedtime.      Allergies  Allergen Reactions  . Cephalexin Other (See Comments)    hallucinations Other reaction(s): Delusions (intolerance)  . Penicillin G Itching  . Penicillins Swelling and Rash    Itching Has patient had a PCN reaction causing immediate rash,  facial/tongue/throat swelling, SOB or lightheadedness with hypotension: Yes Has patient had a PCN reaction causing severe rash involving mucus membranes or skin necrosis: Yes Has patient had a PCN reaction that required hospitalization: No Has patient had a PCN reaction occurring within the last 10 years: No If all of the above answers are "NO", then may proceed with Cephalosporin use.   Follow-up Information    Redmond School, MD Follow up in 1 week(s).   Specialty:  Internal Medicine Why:  hospital discharge follow up, repeat cbc/bmp at follow up  pcp to refer to local palliative care services and continue goals of care discussion Contact information: [address] [place] [address] [phone]  Phillips Odor, MD Follow up in 2 week(s).   Specialty:  Neurology Why:  dementia eval and management Contact information: [address] [place] [address] [phone]        [facility] Follow up in 2 week(s).   Why:  dementia eval and management Contact information: [address]     [place] [address] [phone]           The results of significant diagnostics from this hospitalization (including imaging, microbiology, ancillary and laboratory) are listed below for reference.    Significant Diagnostic Studies: Dg Chest 2 View  Result Date: 05/16/2018 CLINICAL DATA:  Shortness of breath EXAM: CHEST - 2 VIEW COMPARISON:  03/01/2018 and 03/03/2018 FINDINGS: Unchanged S1 of left chest wall pacemaker leads. Status post CABG. Unchanged cardiomegaly. No focal airspace consolidation or pulmonary edema. No pleural effusion or pneumothorax. IMPRESSION: No active cardiopulmonary disease. Electronically Signed   By: Ulyses Jarred M.D.   On: 05/16/2018 03:16    Microbiology: Recent Results (from the past 240 hour(s))  Urine Culture     Status: None   Collection Time: 05/19/18  8:18 PM  Result Value  Ref Range Status   Specimen Description   Final    URINE, RANDOM Performed at [facility] [address] [place], [place] [address]    Special Requests   Final    NONE Performed at [facility], [place] [address], [place], [place] [address]    Culture   Final    NO GROWTH Performed at [facility], [place] [address], [place], [place] [address]    Report Status 05/21/2018 FINAL  Final     Labs: Basic Metabolic Panel: Recent Labs  Lab 05/15/18 2326 05/19/18 2018 05/20/18 0509 05/21/18 0517 05/22/18 0455  NA 135 136 136 138 137  K 4.3 4.4 4.2 3.7 4.2  CL 98 100 102 105 105  CO2 27 26 25 24 24   GLUCOSE 242* 75 103* 98 125*  BUN 51* 51* 49* 42* 29*  CREATININE 1.38* 2.03* 1.91* 1.74* 1.40*  CALCIUM 9.2 8.9 8.7* 8.9 9.0  MG  --   --  2.2 2.4 2.2   Liver Function Tests: Recent Labs  Lab 05/15/18 2326  AST 19  ALT 10  ALKPHOS 73  BILITOT 1.3*  PROT 6.5  ALBUMIN 3.4*   No results for input(s): LIPASE, AMYLASE in the last 168 hours. No results for input(s): AMMONIA in the last 168 hours. CBC: Recent Labs  Lab 05/15/18 2326 05/19/18 2018 05/20/18 0509 05/21/18 0517 05/22/18 0455  WBC 8.1 6.6 5.1 5.0 5.9  NEUTROABS 6.7 4.7 3.3 3.3 3.9  HGB 10.7* 10.8* 9.5* 9.9* 10.1*  HCT 33.7* 34.7* 30.4* 31.8* 31.8*  MCV 85.8 84.8 86.9 85.0 84.8  PLT 183 196 151 184 164   Cardiac Enzymes: Recent Labs  Lab 05/15/18 2326  TROPONINI 0.04*   BNP: BNP (last 3 results) Recent Labs    05/15/18 2326  BNP 506.0*    ProBNP (last 3 results) No results for input(s): PROBNP in the last 8760 hours.  CBG: Recent Labs  Lab 05/21/18 1634 05/21/18 2122 05/22/18 0753 05/22/18 1204 05/22/18 1653  GLUCAP 273* 112* 101* 148* 119*       Signed:  Florencia Reasons MD, PhD  [facility] 05/22/2018, 5:22 PM

## 2018-05-22 NOTE — Consult Note (Signed)
Consultation Note Date: 05/22/2018   Patient Name: Neil [name]  DOB: [birth year]  MRN: [id number]  Age / Sex: 84 y.o., male  PCP: Neil School, MD Referring Physician: Florencia Reasons, MD  Reason for Consultation: Establishing goals of care and Psychosocial/spiritual support  HPI/Patient Profile: 84 y.o. male  with past medical history of alzheimers dementia, urinary retention with recurrent UTIs, CHF with an EF of 15%, atrial fibrillation on pradaxa, CKD stg 3 who was admitted on 05/19/2018 with altered mental status and hematuria.   He was found to have acute on chronic kidney disease and metabolic encephalopathy due to UTI.  During his hospital stay he had difficulty with on going encephalopathy and agitation.  Clinical Assessment and Goals of Care:  I have reviewed medical records including EPIC notes, labs and imaging, received report from the care team, assessed the patient and then met at the bedside along with his wife and two close family friends (Neil [name] and her daughter) to discuss diagnosis prognosis, GOC, EOL wishes, disposition and options.  I introduced Palliative Medicine as specialized medical care for people living with serious illness. It focuses on providing relief from the symptoms and stress of a serious illness. The goal is to improve quality of life for both the patient and the family.  We discussed a brief life review of the patient. He is a Psychologist, sport and exercise (tobacco, pecans).  He and his wife Neil [name] had a son who unfortunately passed from liver disease when he was 17.  After his hospitalization in May of 2019,  Neil [name] and her family (husband and two children) moved into the farm house with Neil [name] and Neil [name].  Neil [name] has been managing the care of Neil [name] and Neil [name] ever since.  As far as functional and nutritional status, per Neil [name] eats very well at home.  He has not been eating well this  hospitalization.  On further questioning Neil [name] reports that Neil [name] has been having difficulty with aspiration and has had aspiration pneumonia.  She thickens his liquids.  Neil [name] moves around in a wheel chair.  He has not fallen and he can walk but Neil [name] puts him in a wheel chair in order to prevent a fall.     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  He would be very unhappy if he was unable to move around or go outside. Neil [name]'s goal is to prevent Neil [name] from ever going to a SNF and keep his quality of life as high as possible.  He is highly concerned with his comfort and happiness.    Via the couple's long term care policy, Neil [name] has secured four hours of home nursing per day.  She is trying to qualify Neil [name] in order to increase the hours per day of home support.  We discussed Hospice as another form of support in the home.   Neil [name] was very concerned that a car would drive up with "Hospice" on the side of the car and the neighbors  would then "talk" about them and come to visit Neil [name].  Neil [name] does not want their business "out on the clothesline".  We discussed his current illness and what it means in the larger context of his on-going co-morbidities.  Natural disease trajectory and expectations at EOL were discussed.  Specifically we discussed recurrent UTIs that happen with catheters, as well as Karem's advanced heart failure and his progressive dementia.  Both Neil [name] and Neil [name] were surprised to learn how important each of these things are and how severe they are in [name].   We discussed quality of life versus quantity of life, and we discussed code status.  Neil [name] will consider changing Neil [name]'s code status.  They are very focused on his comfort and happiness.  Hospice and Palliative Care services outpatient were explained and offered.  Neil [name] will consider these things but do not feel they are ready to take advantage of Hospice  yet.  Questions and concerns were addressed.  Hard Choices booklet left for review. The family was encouraged to call with questions or concerns.    Primary Decision Maker:  NEXT OF KIN Neil [name] (Wife).  Neil [name] is very involved and influential but is not a relative and not a Optician, dispensing.    SUMMARY OF RECOMMENDATIONS    If available in his area - Palliative follow up outpatient would be helpful. Family ready to discharge home.  They have my card and will call with questions.  Code Status/Advance Care Planning:  Full   Palliative Prophylaxis:   Aspiration  Psycho-social/Spiritual:   Desire for further Chaplaincy support:  welcomed  Prognosis:  Likely less than 1 year given recurrent UTIs, EF of 15%, advancing dementia, limited mobility and recurrent hospitalizations.    Discharge Planning: Home with Home Health      Primary Diagnoses: Present on Admission: . UTI (urinary tract infection)   I have reviewed the medical record, interviewed the patient and family, and examined the patient. The following aspects are pertinent.  Past Medical History:  Diagnosis Date  . Chronic kidney disease (CKD), stage III (moderate) (HCC)   . Chronic systolic CHF (congestive heart failure) ([place])   . Complete heart block ([place])   . Coronary artery disease    status post [id number]  . Dementia ([place])   . Dyslipidemia   . Foley catheter in place   . Hypertension   . Ischemic cardiomyopathy    per last echo 11-10-2017  ef 15-20%  . Osteoarthritis   . Permanent atrial fibrillation   . Presence of permanent cardiac pacemaker cardiologist-  dr Samuel Bouche Jude status post generator replacement 2009 (original placement 02/ 1998)  . Renal artery stenosis (HCC)    95%  left, 50% right  . Requires assistance with activities of daily living (ADL)   . 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 Type 2 diabetes mellitus ([place])   . Urinary retention   . Uses wheelchair    Social History   Socioeconomic  History  . Marital status: Married    Spouse name: Neil [name]  . Number of children: Not on file  . Years of education: Not on file  . Highest education level: Not on file  Occupational History  . Not on file  Social Needs  . Financial resource strain: Not hard at all  . Food insecurity:    Worry: Never true    Inability: Never true  . Transportation needs:    Medical: No  Non-medical: No  Tobacco Use  . Smoking status: Never Smoker  . Smokeless tobacco: Never Used  Substance and Sexual Activity  . Alcohol use: No  . Drug use: No  . Sexual activity: Not on file  Lifestyle  . Physical activity:    Days per week: 0 days    Minutes per session: 0 min  . Stress: Only a little  Relationships  . Social connections:    Talks on phone: Once a week    Gets together: More than three times a week    Attends religious service: 1 to 4 times per year    Active member of club or organization: No    Attends meetings of clubs or organizations: Never    Relationship status: Married  Other Topics Concern  . Not on file  Social History Narrative  . Not on file   Family History  Problem Relation Age of Onset  . Other Mother   . Cancer Mother        breast  . Alzheimer's disease Sister    Scheduled Meds: . dabigatran  75 mg Oral Q12H  . feeding supplement (ENSURE ENLIVE)  237 mL Oral BID BM  . insulin aspart  0-9 Units Subcutaneous TID WC  . levothyroxine  25 mcg Oral Q0600  . multivitamin with minerals  1 tablet Oral Daily  . OLANZapine  2.5 mg Oral QHS  . rOPINIRole  0.5 mg Oral QHS   Continuous Infusions: . cefTRIAXone (ROCEPHIN)  IV Stopped (05/21/18 2305)   PRN Meds:.albuterol, bisacodyl, docusate sodium, haloperidol **OR** haloperidol lactate, RESOURCE THICKENUP CLEAR, senna Allergies  Allergen Reactions  . Cephalexin Other (See Comments)    hallucinations Other reaction(s): Delusions (intolerance)  . Penicillin G Itching  . Penicillins Swelling and Rash     Itching Has patient had a PCN reaction causing immediate rash, facial/tongue/throat swelling, SOB or lightheadedness with hypotension: Yes Has patient had a PCN reaction causing severe rash involving mucus membranes or skin necrosis: Yes Has patient had a PCN reaction that required hospitalization: No Has patient had a PCN reaction occurring within the last 10 years: No If all of the above answers are "NO", then may proceed with Cephalosporin use.   Review of Systems patient confused  Physical Exam  Chronically ill appearing man, very confused, in mitts lying in bed. CV rrr resp no distress Abdomen soft, nt, nd LE no edema, able to move all 4.  Vital Signs: BP 133/76 (BP Location: Left Arm)   Pulse 69   Temp 98.4 F (36.9 C) (Oral)   Resp 16   Ht '5\' 8"'$  (1.727 m)   Wt 64.1 kg   SpO2 97%   BMI 21.49 kg/m  Pain Scale: 0-10   Pain Score: 0-No pain   SpO2: SpO2: 97 % O2 Device:SpO2: 97 % O2 Flow Rate: .   IO: Intake/output summary:   Intake/Output Summary (Last 24 hours) at 05/22/2018 1638 Last data filed at 05/22/2018 0900 Gross per 24 hour  Intake 560 ml  Output 1525 ml  Net -965 ml    LBM:   Baseline Weight: Weight: 67.1 kg Most recent weight: Weight: 64.1 kg     Palliative Assessment/Data: 50%     Time In: 3:00 Time Out: 4:30 Time Total: 90 min Greater than 50%  of this time was spent counseling and coordinating care related to the above assessment and plan.  Signed by: Florentina Jenny, PA-C Palliative Medicine Pager: [phone]  Please contact Palliative Medicine Team  phone at [phone] for questions and concerns.  For individual provider: See Shea Evans

## 2018-05-23 DIAGNOSIS — E1122 Type 2 diabetes mellitus with diabetic chronic kidney disease: Secondary | ICD-10-CM | POA: Diagnosis not present

## 2018-05-23 DIAGNOSIS — I255 Ischemic cardiomyopathy: Secondary | ICD-10-CM | POA: Diagnosis not present

## 2018-05-23 DIAGNOSIS — I129 Hypertensive chronic kidney disease with stage 1 through stage 4 chronic kidney disease, or unspecified chronic kidney disease: Secondary | ICD-10-CM | POA: Diagnosis not present

## 2018-05-23 DIAGNOSIS — F0390 Unspecified dementia without behavioral disturbance: Secondary | ICD-10-CM | POA: Diagnosis not present

## 2018-05-23 DIAGNOSIS — N183 Chronic kidney disease, stage 3 (moderate): Secondary | ICD-10-CM | POA: Diagnosis not present

## 2018-05-23 DIAGNOSIS — N390 Urinary tract infection, site not specified: Secondary | ICD-10-CM | POA: Diagnosis not present

## 2018-05-24 ENCOUNTER — Emergency Department (HOSPITAL_COMMUNITY): Payer: Medicare Other

## 2018-05-24 ENCOUNTER — Emergency Department (HOSPITAL_COMMUNITY)
Admission: EM | Admit: 2018-05-24 | Discharge: 2018-05-24 | Disposition: A | Discharge status: Home / Self Care | Payer: Medicare Other | Attending: Emergency Medicine | Admitting: Emergency Medicine

## 2018-05-24 ENCOUNTER — Other Ambulatory Visit: Payer: Self-pay

## 2018-05-24 ENCOUNTER — Encounter (HOSPITAL_COMMUNITY): Payer: Self-pay | Admitting: Emergency Medicine

## 2018-05-24 DIAGNOSIS — Z79899 Other long term (current) drug therapy: Secondary | ICD-10-CM | POA: Diagnosis not present

## 2018-05-24 DIAGNOSIS — Z7984 Long term (current) use of oral hypoglycemic drugs: Secondary | ICD-10-CM | POA: Insufficient documentation

## 2018-05-24 DIAGNOSIS — E860 Dehydration: Secondary | ICD-10-CM | POA: Diagnosis not present

## 2018-05-24 DIAGNOSIS — I5022 Chronic systolic (congestive) heart failure: Secondary | ICD-10-CM | POA: Insufficient documentation

## 2018-05-24 DIAGNOSIS — N183 Chronic kidney disease, stage 3 (moderate): Secondary | ICD-10-CM | POA: Insufficient documentation

## 2018-05-24 DIAGNOSIS — F0390 Unspecified dementia without behavioral disturbance: Secondary | ICD-10-CM | POA: Insufficient documentation

## 2018-05-24 DIAGNOSIS — I130 Hypertensive heart and chronic kidney disease with heart failure and stage 1 through stage 4 chronic kidney disease, or unspecified chronic kidney disease: Secondary | ICD-10-CM | POA: Diagnosis not present

## 2018-05-24 DIAGNOSIS — E1122 Type 2 diabetes mellitus with diabetic chronic kidney disease: Secondary | ICD-10-CM | POA: Diagnosis not present

## 2018-05-24 DIAGNOSIS — I2510 Atherosclerotic heart disease of native coronary artery without angina pectoris: Secondary | ICD-10-CM | POA: Diagnosis not present

## 2018-05-24 DIAGNOSIS — Z951 Presence of aortocoronary bypass graft: Secondary | ICD-10-CM | POA: Insufficient documentation

## 2018-05-24 DIAGNOSIS — R5381 Other malaise: Secondary | ICD-10-CM | POA: Diagnosis not present

## 2018-05-24 DIAGNOSIS — Z955 Presence of coronary angioplasty implant and graft: Secondary | ICD-10-CM | POA: Diagnosis not present

## 2018-05-24 DIAGNOSIS — R34 Anuria and oliguria: Secondary | ICD-10-CM | POA: Diagnosis not present

## 2018-05-24 DIAGNOSIS — Z950 Presence of cardiac pacemaker: Secondary | ICD-10-CM | POA: Insufficient documentation

## 2018-05-24 DIAGNOSIS — N390 Urinary tract infection, site not specified: Secondary | ICD-10-CM | POA: Diagnosis not present

## 2018-05-24 DIAGNOSIS — R531 Weakness: Secondary | ICD-10-CM | POA: Diagnosis not present

## 2018-05-24 DIAGNOSIS — I10 Essential (primary) hypertension: Secondary | ICD-10-CM | POA: Diagnosis not present

## 2018-05-24 DIAGNOSIS — Z681 Body mass index (BMI) 19 or less, adult: Secondary | ICD-10-CM | POA: Diagnosis not present

## 2018-05-24 DIAGNOSIS — J811 Chronic pulmonary edema: Secondary | ICD-10-CM | POA: Diagnosis not present

## 2018-05-24 DIAGNOSIS — R4020 Unspecified coma: Secondary | ICD-10-CM | POA: Diagnosis not present

## 2018-05-24 DIAGNOSIS — R638 Other symptoms and signs concerning food and fluid intake: Secondary | ICD-10-CM | POA: Diagnosis present

## 2018-05-24 DIAGNOSIS — R4182 Altered mental status, unspecified: Secondary | ICD-10-CM | POA: Diagnosis not present

## 2018-05-24 DIAGNOSIS — Z743 Need for continuous supervision: Secondary | ICD-10-CM | POA: Diagnosis not present

## 2018-05-24 LAB — CBC WITH DIFFERENTIAL/PLATELET
ABS IMMATURE GRANULOCYTES: 0.04 10*3/uL (ref 0.00–0.07)
BASOS ABS: 0 10*3/uL (ref 0.0–0.1)
Basophils Relative: 0 %
EOS PCT: 3 %
Eosinophils Absolute: 0.3 10*3/uL (ref 0.0–0.5)
HEMATOCRIT: 35.3 % — AB (ref 39.0–52.0)
HEMOGLOBIN: 10.9 g/dL — AB (ref 13.0–17.0)
IMMATURE GRANULOCYTES: 1 %
LYMPHS ABS: 1 10*3/uL (ref 0.7–4.0)
Lymphocytes Relative: 13 %
MCH: 26.8 pg (ref 26.0–34.0)
MCHC: 30.9 g/dL (ref 30.0–36.0)
MCV: 86.7 fL (ref 80.0–100.0)
Monocytes Absolute: 0.6 10*3/uL (ref 0.1–1.0)
Monocytes Relative: 7 %
Neutro Abs: 6.1 10*3/uL (ref 1.7–7.7)
Neutrophils Relative %: 76 %
Platelets: 181 10*3/uL (ref 150–400)
RBC: 4.07 MIL/uL — AB (ref 4.22–5.81)
RDW: 18.1 % — ABNORMAL HIGH (ref 11.5–15.5)
WBC: 8 10*3/uL (ref 4.0–10.5)
nRBC: 0 % (ref 0.0–0.2)

## 2018-05-24 LAB — URINALYSIS, ROUTINE W REFLEX MICROSCOPIC
Bilirubin Urine: NEGATIVE
Glucose, UA: NEGATIVE mg/dL
Hgb urine dipstick: NEGATIVE
Ketones, ur: NEGATIVE mg/dL
LEUKOCYTES UA: NEGATIVE
NITRITE: NEGATIVE
PROTEIN: NEGATIVE mg/dL
Specific Gravity, Urine: 1.016 (ref 1.005–1.030)
pH: 6 (ref 5.0–8.0)

## 2018-05-24 LAB — COMPREHENSIVE METABOLIC PANEL
ALBUMIN: 3.4 g/dL — AB (ref 3.5–5.0)
ALK PHOS: 73 U/L (ref 38–126)
ALT: 12 U/L (ref 0–44)
ANION GAP: 8 (ref 5–15)
AST: 24 U/L (ref 15–41)
BUN: 39 mg/dL — ABNORMAL HIGH (ref 8–23)
CO2: 25 mmol/L (ref 22–32)
Calcium: 9.7 mg/dL (ref 8.9–10.3)
Chloride: 107 mmol/L (ref 98–111)
Creatinine, Ser: 1.59 mg/dL — ABNORMAL HIGH (ref 0.61–1.24)
GFR calc Af Amer: 43 mL/min — ABNORMAL LOW (ref >60)
GFR calc non Af Amer: 37 mL/min — ABNORMAL LOW (ref >60)
GLUCOSE: 95 mg/dL (ref 70–99)
POTASSIUM: 3.8 mmol/L (ref 3.5–5.1)
SODIUM: 140 mmol/L (ref 135–145)
TOTAL PROTEIN: 7 g/dL (ref 6.5–8.1)
Total Bilirubin: 1.6 mg/dL — ABNORMAL HIGH (ref 0.3–1.2)

## 2018-05-24 LAB — I-STAT CG4 LACTIC ACID, ED: LACTIC ACID, VENOUS: 1.04 mmol/L (ref 0.5–1.9)

## 2018-05-24 MED ORDER — SODIUM CHLORIDE 0.9 % IV BOLUS
1000.0000 mL | Freq: Once | INTRAVENOUS | Status: AC
  Administered 2018-05-24: 1000 mL via INTRAVENOUS

## 2018-05-24 MED ORDER — CIPROFLOXACIN IN D5W 400 MG/200ML IV SOLN
400.0000 mg | Freq: Once | INTRAVENOUS | Status: AC
  Administered 2018-05-24: 400 mg via INTRAVENOUS
  Filled 2018-05-24: qty 200

## 2018-05-24 NOTE — Care Management (Signed)
Patient Information  SS# [id number]  Patient Name Neil [name], Neil [name] ([id number]) Sex Male DOB 1928/09/08  Room Bed  APA18 APA18  Patient Demographics   Address [address] [place] [address] Phone [phone] (Home)  Patient Ethnicity & Race   Ethnic Group Patient Race  Not Hispanic or Latino White or Caucasian  Emergency Contact(s)   Name Relation Home Work Mobile  Kosek,[name] Spouse [phone]    Mariane Masters Daughter [phone]  [phone]  Documents on File    Status Date Received Description  Documents for the Patient  EMR Medication Summary Not Received    EMR Problem Summary Not Received    EMR Patient Summary Not Received    [place] HIPAA NOTICE OF PRIVACY - Scanned Received 08/11/10   [place] E-Signature HIPAA Notice of Privacy Received 10/26/10   [place] E-Signature HIPAA Notice of Privacy Spanish Not Received    Driver's License Received 81/15/72 NCDL EXP 3.30.2021  Advance Directives/Living Will/HCPOA/POA Not Received    Historic Radiology Documentation Not Received    Insurance Card Received 10/26/10   Insurance Card Received    Editor, commissioning Not Received    [employer] Not Received    Insurance Card Received 11/09/14   Insurance Card Received 12/24/12   [place] E-Signature HIPAA Notice of Privacy Received 12/24/12 [facility]  Other Photo ID Not Received    Release of Information Received 02/15/17 DPR/LW/CHMGHT  AMB Correspondence Received 11/27/17 LETTER MEDNET [place] Card Received 04/12/18 MCR A/B & BCBS OF [place] SUPPL-2019  Patient Photo   Photo of Patient  Documents for the Encounter  AOB (Assignment of Insurance Benefits) Not Received    E-signature AOB Signed 05/24/18   MEDICARE RIGHTS Not Received    E-signature Medicare Rights Signed 05/24/18   Admission Information   Attending Provider Admitting Provider Admission Type Admission Date/Time  Milton Ferguson, MD  Emergency 05/24/18  1159  Discharge Date Hospital Service Auth/Cert Status Service Area   Emergency Medicine Incomplete [facility]  Unit Room/Bed Admission Status   [facility] DEPT APA18/APA18 Admission (Confirmed)   Admission   Complaint  [facility] Account   Name Acct ID Class Status Primary Coverage  Neil [name], Neil [name] [id number] Emergency Open MEDICARE - MEDICARE PART A AND B      Guarantor Account (for [place] [id number])   Name Relation to Pt Service Area Active? Acct Type  Neil [name] Self CHSA Yes Personal/Family  Address Phone    [address] [place],  [address] [phone])        Coverage Information (for Hospital Account [id number])   1. [place] PART A AND B   F/O Payor/Plan Precert #  MEDICARE/MEDICARE PART A AND B   Subscriber Subscriber #  Neil [name], Neil [name] [id number]  Address Phone  [address] [place], [place] [address]   2. BLUE CROSS BLUE SHIELD/BCBS SUPPLEMENT   F/O Payor/Plan Precert #  BLUE CROSS BLUE SHIELD/BCBS SUPPLEMENT   Subscriber Subscriber #  Neil [name], Neil [name] [id number]  Address Phone  [address] [place],  [address] [phone]

## 2018-05-24 NOTE — Clinical Social Work Note (Signed)
Met with patient Neil [name] was sleeping] daughter Modena Nunnery and wife Onalee Hua.  They explained that they have [place] services and Cataract involved, want the patient to remain at home, but also want to make sure he is comfortable and getting all related care to this.  Agreeable to referral to [facility].  Referral made.  No further questions nor concerns.

## 2018-05-24 NOTE — ED Notes (Signed)
Family wanting to take pt home, will need EMS to assist to get pt in the home. Pt is okay Barbaraann Rondo putting in for a Hospice consult.

## 2018-05-24 NOTE — ED Provider Notes (Signed)
[facility] EMERGENCY DEPARTMENT Provider Note   CSN: [id number] Arrival date & time: 05/24/18  1058     History   Chief Complaint Chief Complaint  Patient presents with  . Dehydration    HPI Neil [name] is a 84 y.o. male.  Patient was discharged 2 days ago from the hospital with confusion secondary to UTI and dementia along with congestive heart failure.  His family brought him back because states that he is not eating or drinking much.  The history is provided by a relative. No language interpreter was used.  Weakness  Primary symptoms include no focal weakness. This is a chronic problem. The current episode started 2 days ago. The problem has not changed since onset.There was no focality noted. There has been no fever. Pertinent negatives include no shortness of breath and no chest pain. There were no medications administered prior to arrival. Associated medical issues do not include trauma.    Past Medical History:  Diagnosis Date  . Chronic kidney disease (CKD), stage III (moderate) (HCC)   . Chronic systolic CHF (congestive heart failure) ([place])   . Complete heart block ([place])   . Coronary artery disease    status post [id number]  . Dementia ([place])   . Dyslipidemia   . Foley catheter in place   . Hypertension   . Ischemic cardiomyopathy    per last echo 11-10-2017  ef 15-20%  . Osteoarthritis   . Permanent atrial fibrillation   . Presence of permanent cardiac pacemaker cardiologist-  dr Samuel Bouche Jude status post generator replacement 2009 (original placement 02/ 1998)  . Renal artery stenosis (HCC)    95%  left, 50% right  . Requires assistance with activities of daily living (ADL)   . 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 Type 2 diabetes mellitus ([place])   . Urinary retention   . Uses wheelchair     Patient Active Problem List   Diagnosis Date Noted  . Systolic congestive heart failure ([place])   . Palliative care encounter   . Goals of care, counseling/discussion   .  Aspiration pneumonia ([place]) 02/25/2018  . UTI (urinary tract infection) 02/25/2018  . Protein-calorie malnutrition, severe 12/18/2017  . Hypoglycemia due to type 2 diabetes mellitus ([place]) 12/17/2017  . Confusion   . General weakness   . Dehydration 11/09/2017  . FTT (failure to thrive) in adult 11/09/2017  . AKI (acute kidney injury) ([place]) 11/09/2017  . Dementia ([place]) 11/09/2017  . Renal artery stenosis ([place])   . CKD (chronic kidney disease) stage 3, GFR 30-59 ml/min (HCC)   . Pacemaker-St. Jude 04/20/2011  . Permanent atrial fibrillation 05/03/2010  . AV BLOCK, COMPLETE 11/02/2009  . ABDOMINAL PAIN-EPIGASTRIC 10/27/2008  . AODM 07/25/2008  . Type 2 diabetes mellitus with hyperlipidemia ([place]) 07/25/2008  . Cardiomyopathy, ischemic 07/25/2008  . ATHEROSCLEROSIS OF RENAL ARTERY 07/25/2008  . HYPERTENSION, BENIGN 07/25/2008    Past Surgical History:  Procedure Laterality Date  .  suspicous skin lesions-nose and upper back-removed 04/15/18    . CARDIOVASCULAR STRESS TEST  05-22-2012   dr Caryl Comes   Low risk adenosine no exercise nuclear study w/ no ischemia/  ef 31%,  LV moderately enlarged , global hypokinesis and severe hypokinesis of the inferior wall  . CORONARY ANGIOPLASTY WITH STENT PLACEMENT  spring 1998   stenting  . CORONARY ARTERY BYPASS GRAFT  fall 1998   x4  . INSERTION OF SUPRAPUBIC CATHETER N/A 04/16/2018   Procedure: INSERTION OF SUPRAPUBIC  CATHETER;  Surgeon: Irine Seal, MD;  Location: [facility] ORS;  Service: Urology;  Laterality: N/A;  . PACEMAKER GENERATOR CHANGE  10-30-2007  dr Samuel Bouche Jude dual chamber  . PACEMAKER INSERTION  02/ 1998   dr Caryl Comes  . skin lesions    . TRANSTHORACIC ECHOCARDIOGRAM  11/10/2017   mild LVH, ef 15-20%, diffuse hypokinesis, LVDF not evaluated/  mild AS/  severe LAE/  RVSF severely reduced/  mild MR, PR, and TR/  PASP 83mmHg        Home Medications    Prior to Admission medications   Medication Sig Start Date End Date Taking?  Authorizing Provider  albuterol (PROVENTIL) (2.5 MG/3ML) 0.083% nebulizer solution Take 3 mLs (2.5 mg total) by nebulization every 6 (six) hours as needed for wheezing or shortness of breath. 02/27/18  Yes Memon, Jolaine Artist, MD  ALPRAZolam (XANAX) 0.25 MG tablet Take 0.25 mg by mouth at bedtime.   Yes [provider]  atorvastatin (LIPITOR) 40 MG tablet Take 40 mg by mouth daily.     Yes [provider]  ciprofloxacin (CIPRO) 500 MG tablet Take 1 tablet by mouth 2 (two) times daily. 05/23/18  Yes [provider]  dabigatran (PRADAXA) 75 MG CAPS capsule Take 75 mg by mouth daily.   Yes [provider]  FEROSUL 325 (65 Fe) MG tablet Take 1 tablet by mouth daily. 03/14/18  Yes [provider]  furosemide (LASIX) 20 MG tablet Take 2 tablets (40 mg total) by mouth every other day as needed for fluid or edema. 05/22/18  Yes Florencia Reasons, MD  glipiZIDE (GLUCOTROL XL) 2.5 MG 24 hr tablet Take 1 tablet by mouth every evening.  03/12/18  Yes [provider]  levothyroxine (SYNTHROID, LEVOTHROID) 25 MCG tablet Take 25 mcg by mouth daily. 04/13/18  Yes [provider]  Maltodextrin-Xanthan Gum (RESOURCE THICKENUP CLEAR) POWD Thicken fluids to nectar thick 02/27/18  Yes Memon, Jolaine Artist, MD  pantoprazole (PROTONIX) 40 MG tablet Take 40 mg by mouth daily.   Yes [provider]  polyethylene glycol (MIRALAX) packet Take 17 g by mouth daily. Patient taking differently: Take 17 g by mouth daily as needed for moderate constipation.  03/03/18  Yes Caryl Ada K, PA-C  rOPINIRole (REQUIP) 0.5 MG tablet Take 0.5 mg by mouth at bedtime.    Yes [provider]  cefpodoxime (VANTIN) 200 MG tablet Take 1 tablet (200 mg total) by mouth every 12 (twelve) hours for 5 days. Patient not taking: Reported on 05/24/2018 05/22/18 05/27/18  Florencia Reasons, MD  feeding supplement, ENSURE ENLIVE, (ENSURE ENLIVE) LIQD Take 237 mLs by mouth 2 (two) times daily between meals.  12/18/17   Heath Lark D, DO    Family History Family History  Problem Relation Age of Onset  . Other Mother   . Cancer Mother        breast  . Alzheimer's disease Sister     Social History Social History   Tobacco Use  . Smoking status: Never Smoker  . Smokeless tobacco: Never Used  Substance Use Topics  . Alcohol use: No  . Drug use: No     Allergies   Cephalexin; Penicillin g; and Penicillins   Review of Systems Review of Systems  Unable to perform ROS: Dementia  Respiratory: Negative for shortness of breath.   Cardiovascular: Negative for chest pain.  Neurological: Positive for weakness. Negative for focal weakness.     Physical Exam Updated Vital Signs BP 126/71  Pulse 78   Temp 98 F (36.7 C) (Oral)   Resp 15   Ht 5\' 8"  (1.727 m)   Wt 64.1 kg   SpO2 100%   BMI 21.49 kg/m   Physical Exam  Constitutional: He appears well-developed.  HENT:  Head: Normocephalic.  Dry mucous membranes  Eyes: Conjunctivae and EOM are normal. No scleral icterus.  Neck: Neck supple. No thyromegaly present.  Cardiovascular: Normal rate and regular rhythm. Exam reveals no gallop and no friction rub.  No murmur heard. Pulmonary/Chest: No stridor. He has no wheezes. He has no rales. He exhibits no tenderness.  Abdominal: He exhibits no distension. There is no tenderness. There is no rebound.  Musculoskeletal: Normal range of motion. He exhibits no edema.  Lymphadenopathy:    He has no cervical adenopathy.  Neurological: He exhibits normal muscle tone. Coordination normal.  Lethargic  Skin: No rash noted. No erythema.  Psychiatric: He has a normal mood and affect. His behavior is normal.     ED Treatments / Results  Labs (all labs ordered are listed, but only abnormal results are displayed) Labs Reviewed  CBC WITH DIFFERENTIAL/PLATELET - Abnormal; Notable for the following components:      Result Value   RBC 4.07 (*)    Hemoglobin 10.9 (*)    HCT 35.3 (*)     RDW 18.1 (*)    All other components within normal limits  COMPREHENSIVE METABOLIC PANEL - Abnormal; Notable for the following components:   BUN 39 (*)    Creatinine, Ser 1.59 (*)    Albumin 3.4 (*)    Total Bilirubin 1.6 (*)    GFR calc non Af Amer 37 (*)    GFR calc Af Amer 43 (*)    All other components within normal limits  URINALYSIS, ROUTINE W REFLEX MICROSCOPIC  I-STAT CG4 LACTIC ACID, ED    EKG None  Radiology Ct Head Wo Contrast  Result Date: 05/24/2018 CLINICAL DATA:  Altered level of consciousness and dehydration EXAM: CT HEAD WITHOUT CONTRAST TECHNIQUE: Contiguous axial images were obtained from the base of the skull through the vertex without intravenous contrast. COMPARISON:  12/17/2017 FINDINGS: Brain: Diffuse atrophic changes and chronic white matter ischemic changes are seen. No findings to suggest acute hemorrhage, acute infarction or space-occupying mass lesion are seen. Vascular: No hyperdense vessel or unexpected calcification. Skull: Normal. Negative for fracture or focal lesion. Sinuses/Orbits: No acute finding. Other: None. IMPRESSION: Chronic atrophic and ischemic changes without acute abnormality. Electronically Signed   By: Inez Catalina M.D.   On: 05/24/2018 16:24   Dg Chest Portable 1 View  Result Date: 05/24/2018 CLINICAL DATA:  Cough/weakness/recent dehydration and kidney infection/chf/htn/a fib/diabetic/hx cabg x 4/pacemaker insertion/non smoker EXAM: PORTABLE CHEST 1 VIEW COMPARISON:  Chest x-rays dated 05/16/2018 and 03/01/2018. FINDINGS: Stable cardiomegaly. Mild central pulmonary vascular congestion. Lungs otherwise clear. No pleural effusion or pneumothorax seen. Osseous structures about the chest are unremarkable. LEFT chest wall pacemaker leads appear stable in position. IMPRESSION: Cardiomegaly with central pulmonary vascular congestion suggesting mild CHF/volume overload. No overt alveolar pulmonary edema. No evidence of pneumonia. Electronically  Signed   By: Franki Cabot M.D.   On: 05/24/2018 12:55    Procedures Procedures (including critical care time)  Medications Ordered in ED Medications  sodium chloride 0.9 % bolus 1,000 mL (0 mLs Intravenous Stopped 05/24/18 1356)  ciprofloxacin (CIPRO) IVPB 400 mg (0 mg Intravenous Stopped 05/24/18 1356)  sodium chloride 0.9 % bolus 1,000 mL (0 mLs Intravenous Stopped 05/24/18  1516)     Initial Impression / Assessment and Plan / ED Course  I have reviewed the triage vital signs and the nursing notes.  Pertinent labs & imaging results that were available during my care of the patient were reviewed by me and considered in my medical decision making (see chart for details).    CRITICAL CARE Performed by: Milton Ferguson Total critical care time: 40 minutes Critical care time was exclusive of separately billable procedures and treating other patients. Critical care was necessary to treat or prevent imminent or life-threatening deterioration. Critical care was time spent personally by me on the following activities: development of treatment plan with patient and/or surrogate as well as nursing, discussions with consultants, evaluation of patient's response to treatment, examination of patient, obtaining history from patient or surrogate, ordering and performing treatments and interventions, ordering and review of laboratory studies, ordering and review of radiographic studies, pulse oximetry and re-evaluation of patient's condition.   CT head unremarkable.  Labs show mild dehydration urinalysis unremarkable.  Patient was given 2 L of fluids to help with hydration.  Admission was offered to the family for the patient.  They did not want to admit the patient.  Social worker came and spoke to the family about getting hospice involved which has already been discussed at the previous admission.  They are going to get hospice involved  Final Clinical Impressions(s) / ED Diagnoses   Final diagnoses:   Dehydration    ED Discharge Orders    None       Milton Ferguson, MD 05/24/18 [phone]

## 2018-05-24 NOTE — ED Notes (Signed)
Family given discharge instruction, verbalized understand. IV removed, band aid applied. Pt and family waiting for EMS to transport pt home.

## 2018-05-24 NOTE — ED Triage Notes (Signed)
Pt was admitted to [facility] a few days ago for dehydration and kidney infection. Pt presents with foley placed 1.5 months ago. Pt daughter reports having cough as well.

## 2018-05-24 NOTE — ED Notes (Signed)
Pt given meal tray, family will assist with meal.

## 2018-05-24 NOTE — ED Notes (Signed)
Called Barbaraann Rondo, social worker to come down a speak with family per EDP.

## 2018-05-24 NOTE — Discharge Instructions (Addendum)
Return if problems and follow up with dr. Gerarda Fraction next week.

## 2018-05-24 NOTE — ED Notes (Signed)
Per family pt was nectar thick at the hospital, chocking on anything thinner.

## 2018-05-27 DIAGNOSIS — E1122 Type 2 diabetes mellitus with diabetic chronic kidney disease: Secondary | ICD-10-CM | POA: Diagnosis not present

## 2018-05-27 DIAGNOSIS — I255 Ischemic cardiomyopathy: Secondary | ICD-10-CM | POA: Diagnosis not present

## 2018-05-27 DIAGNOSIS — N390 Urinary tract infection, site not specified: Secondary | ICD-10-CM | POA: Diagnosis not present

## 2018-05-27 DIAGNOSIS — F0390 Unspecified dementia without behavioral disturbance: Secondary | ICD-10-CM | POA: Diagnosis not present

## 2018-05-27 DIAGNOSIS — N183 Chronic kidney disease, stage 3 (moderate): Secondary | ICD-10-CM | POA: Diagnosis not present

## 2018-05-27 DIAGNOSIS — I129 Hypertensive chronic kidney disease with stage 1 through stage 4 chronic kidney disease, or unspecified chronic kidney disease: Secondary | ICD-10-CM | POA: Diagnosis not present

## 2018-05-27 NOTE — Patient Outreach (Signed)
[facility]) Care Management   01/31/2018  Neil [name] 16-Dec-1928 [id number]  Neil [name] is an 84 y.o. male  Subjective:  Follow up home visit. Neil [name] was alert, up in his wheelchair in no apparent distress at RNCM's arrival.   Objective:   BP 126/62 (BP Location: Left Arm, Patient Position: Sitting, Cuff Size: Normal)   Pulse 72   SpO2 96%   Review of Systems  Constitutional: Negative.   Respiratory: Positive for cough.   Gastrointestinal: Negative for blood in stool, constipation, diarrhea, nausea and vomiting.  Genitourinary: Negative for hematuria.  Musculoskeletal: Negative for falls.  Skin: Negative.     Physical Exam  Constitutional: He appears well-developed.  Cardiovascular: Normal rate.  Respiratory: Effort normal and breath sounds normal.  GI: Soft. Bowel sounds are normal.  Neurological: He is alert.  Skin: Skin is warm and dry.  Psychiatric: He has a normal mood and affect.    Encounter Medications:   Outpatient Encounter Medications as of 01/31/2018  Medication Sig Note  . atorvastatin (LIPITOR) 40 MG tablet Take 40 mg by mouth daily.     . feeding supplement, ENSURE ENLIVE, (ENSURE ENLIVE) LIQD Take 237 mLs by mouth 2 (two) times daily between meals.   . [DISCONTINUED] calcitRIOL (ROCALTROL) 0.25 MCG capsule Take 0.25 mcg by mouth every Monday, Wednesday, and Friday.   . [DISCONTINUED] carvedilol (COREG) 6.25 MG tablet Take 6.25 mg by mouth daily.  04/12/2018: Per daughter (caregiver) pt stopped due to "blood pressure bottomed out" , pt pcp , dr fusco, had pt to hold coreg. Last dose take approx. First week of September 20-2019   . [DISCONTINUED] dabigatran (PRADAXA) 75 MG CAPS capsule Take 1 capsule (75 mg total) by mouth every 12 (twelve) hours. (Patient not taking: Reported on 04/24/2018)   . [DISCONTINUED] glipiZIDE (GLUCOTROL XL) 5 MG 24 hr tablet Take 10 mg by mouth 2 (two) times daily.    . [DISCONTINUED] losartan  (COZAAR) 50 MG tablet Take 50 mg by mouth daily.   . [DISCONTINUED] megestrol (MEGACE ES) 625 MG/5ML suspension Take 5 mLs (625 mg total) by mouth daily. (Patient not taking: Reported on 01/07/2018)    No facility-administered encounter medications on file as of 01/31/2018.     Fall/Depression Screening:    Fall Risk  01/18/2018 01/07/2018  Falls in the past year? No No  Risk for fall due to : Impaired balance/gait;Impaired mobility;Mental status change Impaired balance/gait;Impaired mobility;Mental status change   PHQ 2/9 Scores 01/31/2018 01/18/2018  PHQ - 2 Score 0 0    Assessment:    Home visit complete. Per Neil [name]'s spouse [name] and caregiver Modena Nunnery, he has been doing well. Reported he is tolerating medications as prescribed, has a very good appetite and has not appeared to be uncomfortable or in distress. Reported weight of 200.4 lbs. No falls since last visit. Stated that he recognized his family today and has been very talkative. They are tentatively scheduled to follow up at [facility] to discuss dementia progression.  The family has made several adjustments and modifications to increase Neil [name]'s safety. Several friends/family members were in the home at the time of the visit. [name] reported that friends and family members have been "doing shifts" to ensure that a caregiver is available 24/7. [name] requested additional outreach to discuss the couples long term care insurance policy. Stated they were having difficulty confirming if they qualified for assistance in the home. Agreeable to additional outreach and discussion with [facility]  SW.  [facility] CM Care Plan Problem One     Most Recent Value  Care Plan Problem One  High Risk for falls due to mobility and cognitive impairment  Role Documenting the Problem One  Care Management [place] for Problem One  Active  [facility] Long Term Goal   Patient will not sustain any injuries related to falls over the next 90 days.  [facility] Long Term Goal Start  Date  01/07/18  Interventions for Problem One Long Term Goal  Reviewed safety precautions. Encouraged to continue using wheelchair belt to secure patient in wheelchair. Encouraged to ensure pathways are clear.  [facility] CM Short Term Goal #1   Over the next 30 days family members and caretakers will utilize assistive devices(wheelchair, walker) when assisting patient to ambulate inside and outside of the home.   [facility] CM Short Term Goal #1 Start Date  01/07/18  Interventions for Short Term Goal #1  Encouraged to continue use of wheelchair. Patient able to move within the home in his wheelchair.  [facility] CM Short Term Goal #2   Over the next 30 days caregivers will ensure bed is in the lowest position and bedrails are up when patient is in bed.  [facility] CM Short Term Goal #2 Start Date  01/07/18  Interventions for Short Term Goal #2  Discussed safety precautions and importance of keeping bed in the low position.    [facility] CM Care Plan Problem Two     Most Recent Value  Care Plan Problem Two  Risk for readmission  Role Documenting the Problem Two  Care Management Coordinator  Care Plan for Problem Two  Active  Interventions for Problem Two Long Term Goal   Reviewed medications, nutrition and safety precautions. Attenting appointments as scheduled.   [facility] Long Term Goal  Over the next 90 days patient will not be hospitalized due to chronic disease complications.  [facility] Long Term Goal Start Date  01/07/18  [facility] CM Short Term Goal #1   Over the next 30 days patient will take medications as prescribed.  [facility] CM Short Term Goal #1 Start Date  01/07/18  Interventions for Short Term Goal #2   Reviewed medications. Caregiver reports patient is tolerating as prescribed. [Will continue to assess due to possible med changes.]  [facility] CM Short Term Goal #2   Over the next 30 days caregiver will ensure member attends MD appointments as scheduled.  [facility] CM Short Term Goal #2 Start Date  01/07/18  Interventions for Short Term Goal #2   Discussed transportation needs and pending appointments. Caregiver attempted to obtain converted wheelchair accessible van, but doors/entry was too small. Currently able to transport patient but increased risk of falls when transferring in and out of wheelchair.  [Will inform RNCM if transportation assistance is needed.]      PLAN Will continue routine follow up.  [place] [phone]

## 2018-05-27 NOTE — Patient Outreach (Signed)
[place] [facility]) Care Management  02/06/2018  INFANT ZINK [birth year] [phone]   Outreach from Mr. Knoll's caregiver [name]. She voiced concerns regarding possible expiration of Mr. Grine's long term care policy benefits. Stated that the couple purchased the policy decades ago and per [employer] staff the benefits required further validation due to the policy no longer being sold. She recently contacted the main office but has not received a call back. Expressed concerns due to the large amount of money invested in the policy. Requested info for [facility] SW to discuss options. Info provided. Caregiver agreed to contact RNCM is assistance is needed prior to next outreach.  PLAN Will follow up with [facility] SW  Will continue routine outreach with member.  [place] [phone]

## 2018-05-27 NOTE — Patient Outreach (Addendum)
[place] [facility]) Care Management  05/24/2018  JAJA SWITALSKI [birth year] [phone]   Unsuccessful outreach. Will attempt to reach Mr. Rosselli spouse or caregiver [name] within 3-4 business days.   ADDENDUM Received notification that Mr. Baack was being evaluated at [facility] ED.   PLAN Will follow up on next week.  [place] [phone]

## 2018-05-27 NOTE — Clinical Social Work Note (Signed)
Spoke to [place] at [facility] to confirm she had received referral on patient.  Confirmed, and contact established.Marland Kitchen

## 2018-05-28 DIAGNOSIS — N183 Chronic kidney disease, stage 3 (moderate): Secondary | ICD-10-CM | POA: Diagnosis not present

## 2018-05-28 DIAGNOSIS — I129 Hypertensive chronic kidney disease with stage 1 through stage 4 chronic kidney disease, or unspecified chronic kidney disease: Secondary | ICD-10-CM | POA: Diagnosis not present

## 2018-05-28 DIAGNOSIS — E1122 Type 2 diabetes mellitus with diabetic chronic kidney disease: Secondary | ICD-10-CM | POA: Diagnosis not present

## 2018-05-28 DIAGNOSIS — F0390 Unspecified dementia without behavioral disturbance: Secondary | ICD-10-CM | POA: Diagnosis not present

## 2018-05-28 DIAGNOSIS — N390 Urinary tract infection, site not specified: Secondary | ICD-10-CM | POA: Diagnosis not present

## 2018-05-28 DIAGNOSIS — I255 Ischemic cardiomyopathy: Secondary | ICD-10-CM | POA: Diagnosis not present

## 2018-05-29 ENCOUNTER — Other Ambulatory Visit: Payer: Self-pay

## 2018-05-29 NOTE — Patient Outreach (Signed)
[place] [facility]) Care Management  05/29/2018  Neil [name] [birth year] [id number]   Successful outreach with [name]'s caregiver Modena Nunnery. She reported that [name] was doing well today and seemed to improve after returning home from the ED. Discussed his multiple hospitalizations/ED visits and care management goals. The family was tentatively scheduled for hospice outreach on tomorrow. Per [name], the family does not want to pursue hospice or palliative care and has opted to continue medical treatments and home health care. [name] reported that [name] is very alert, has a good appetite, continues to "scoot" in his wheelchair, and "seems happy" interacting with family and friends. Per treatment plan, he will continue to receive Physical Therapy, Speech Therapy and Nursing services in the home. Additional assistance will be provided by [facility]. Per Modena Nunnery, [name] qualified for assistance 4hrs day/7days a week. [name] requested to continue current outreach plan and agreed to reconsider hospice/palliative care if [name]'s condition declines or available support system changes.    PLAN Will follow up on next month.   [place] [phone]

## 2018-05-30 DIAGNOSIS — I255 Ischemic cardiomyopathy: Secondary | ICD-10-CM | POA: Diagnosis not present

## 2018-05-30 DIAGNOSIS — N183 Chronic kidney disease, stage 3 (moderate): Secondary | ICD-10-CM | POA: Diagnosis not present

## 2018-05-30 DIAGNOSIS — F0390 Unspecified dementia without behavioral disturbance: Secondary | ICD-10-CM | POA: Diagnosis not present

## 2018-05-30 DIAGNOSIS — I129 Hypertensive chronic kidney disease with stage 1 through stage 4 chronic kidney disease, or unspecified chronic kidney disease: Secondary | ICD-10-CM | POA: Diagnosis not present

## 2018-05-30 DIAGNOSIS — N390 Urinary tract infection, site not specified: Secondary | ICD-10-CM | POA: Diagnosis not present

## 2018-05-30 DIAGNOSIS — E1122 Type 2 diabetes mellitus with diabetic chronic kidney disease: Secondary | ICD-10-CM | POA: Diagnosis not present

## 2018-06-01 DIAGNOSIS — I255 Ischemic cardiomyopathy: Secondary | ICD-10-CM | POA: Diagnosis not present

## 2018-06-01 DIAGNOSIS — N390 Urinary tract infection, site not specified: Secondary | ICD-10-CM | POA: Diagnosis not present

## 2018-06-01 DIAGNOSIS — E1122 Type 2 diabetes mellitus with diabetic chronic kidney disease: Secondary | ICD-10-CM | POA: Diagnosis not present

## 2018-06-01 DIAGNOSIS — F0390 Unspecified dementia without behavioral disturbance: Secondary | ICD-10-CM | POA: Diagnosis not present

## 2018-06-01 DIAGNOSIS — N183 Chronic kidney disease, stage 3 (moderate): Secondary | ICD-10-CM | POA: Diagnosis not present

## 2018-06-01 DIAGNOSIS — I129 Hypertensive chronic kidney disease with stage 1 through stage 4 chronic kidney disease, or unspecified chronic kidney disease: Secondary | ICD-10-CM | POA: Diagnosis not present

## 2018-06-02 DIAGNOSIS — F0390 Unspecified dementia without behavioral disturbance: Secondary | ICD-10-CM | POA: Diagnosis not present

## 2018-06-02 DIAGNOSIS — N183 Chronic kidney disease, stage 3 (moderate): Secondary | ICD-10-CM | POA: Diagnosis not present

## 2018-06-02 DIAGNOSIS — I129 Hypertensive chronic kidney disease with stage 1 through stage 4 chronic kidney disease, or unspecified chronic kidney disease: Secondary | ICD-10-CM | POA: Diagnosis not present

## 2018-06-02 DIAGNOSIS — N390 Urinary tract infection, site not specified: Secondary | ICD-10-CM | POA: Diagnosis not present

## 2018-06-02 DIAGNOSIS — E1122 Type 2 diabetes mellitus with diabetic chronic kidney disease: Secondary | ICD-10-CM | POA: Diagnosis not present

## 2018-06-02 DIAGNOSIS — I255 Ischemic cardiomyopathy: Secondary | ICD-10-CM | POA: Diagnosis not present

## 2018-06-03 DIAGNOSIS — F0390 Unspecified dementia without behavioral disturbance: Secondary | ICD-10-CM | POA: Diagnosis not present

## 2018-06-03 DIAGNOSIS — I255 Ischemic cardiomyopathy: Secondary | ICD-10-CM | POA: Diagnosis not present

## 2018-06-03 DIAGNOSIS — I129 Hypertensive chronic kidney disease with stage 1 through stage 4 chronic kidney disease, or unspecified chronic kidney disease: Secondary | ICD-10-CM | POA: Diagnosis not present

## 2018-06-03 DIAGNOSIS — N183 Chronic kidney disease, stage 3 (moderate): Secondary | ICD-10-CM | POA: Diagnosis not present

## 2018-06-03 DIAGNOSIS — E1122 Type 2 diabetes mellitus with diabetic chronic kidney disease: Secondary | ICD-10-CM | POA: Diagnosis not present

## 2018-06-03 DIAGNOSIS — N390 Urinary tract infection, site not specified: Secondary | ICD-10-CM | POA: Diagnosis not present

## 2018-06-04 DIAGNOSIS — F0390 Unspecified dementia without behavioral disturbance: Secondary | ICD-10-CM | POA: Diagnosis not present

## 2018-06-04 DIAGNOSIS — I255 Ischemic cardiomyopathy: Secondary | ICD-10-CM | POA: Diagnosis not present

## 2018-06-04 DIAGNOSIS — Z681 Body mass index (BMI) 19 or less, adult: Secondary | ICD-10-CM | POA: Diagnosis not present

## 2018-06-04 DIAGNOSIS — I10 Essential (primary) hypertension: Secondary | ICD-10-CM | POA: Diagnosis not present

## 2018-06-04 DIAGNOSIS — E119 Type 2 diabetes mellitus without complications: Secondary | ICD-10-CM | POA: Diagnosis not present

## 2018-06-04 DIAGNOSIS — G2581 Restless legs syndrome: Secondary | ICD-10-CM | POA: Diagnosis not present

## 2018-06-04 DIAGNOSIS — E860 Dehydration: Secondary | ICD-10-CM | POA: Diagnosis not present

## 2018-06-04 DIAGNOSIS — N183 Chronic kidney disease, stage 3 (moderate): Secondary | ICD-10-CM | POA: Diagnosis not present

## 2018-06-04 DIAGNOSIS — N390 Urinary tract infection, site not specified: Secondary | ICD-10-CM | POA: Diagnosis not present

## 2018-06-04 DIAGNOSIS — E1122 Type 2 diabetes mellitus with diabetic chronic kidney disease: Secondary | ICD-10-CM | POA: Diagnosis not present

## 2018-06-04 DIAGNOSIS — I129 Hypertensive chronic kidney disease with stage 1 through stage 4 chronic kidney disease, or unspecified chronic kidney disease: Secondary | ICD-10-CM | POA: Diagnosis not present

## 2018-06-05 DIAGNOSIS — E1122 Type 2 diabetes mellitus with diabetic chronic kidney disease: Secondary | ICD-10-CM | POA: Diagnosis not present

## 2018-06-05 DIAGNOSIS — F0390 Unspecified dementia without behavioral disturbance: Secondary | ICD-10-CM | POA: Diagnosis not present

## 2018-06-05 DIAGNOSIS — I129 Hypertensive chronic kidney disease with stage 1 through stage 4 chronic kidney disease, or unspecified chronic kidney disease: Secondary | ICD-10-CM | POA: Diagnosis not present

## 2018-06-05 DIAGNOSIS — N183 Chronic kidney disease, stage 3 (moderate): Secondary | ICD-10-CM | POA: Diagnosis not present

## 2018-06-05 DIAGNOSIS — N390 Urinary tract infection, site not specified: Secondary | ICD-10-CM | POA: Diagnosis not present

## 2018-06-05 DIAGNOSIS — I255 Ischemic cardiomyopathy: Secondary | ICD-10-CM | POA: Diagnosis not present

## 2018-06-10 DIAGNOSIS — N183 Chronic kidney disease, stage 3 (moderate): Secondary | ICD-10-CM | POA: Diagnosis not present

## 2018-06-10 DIAGNOSIS — Z681 Body mass index (BMI) 19 or less, adult: Secondary | ICD-10-CM | POA: Diagnosis not present

## 2018-06-10 DIAGNOSIS — E1122 Type 2 diabetes mellitus with diabetic chronic kidney disease: Secondary | ICD-10-CM | POA: Diagnosis not present

## 2018-06-10 DIAGNOSIS — I255 Ischemic cardiomyopathy: Secondary | ICD-10-CM | POA: Diagnosis not present

## 2018-06-10 DIAGNOSIS — I10 Essential (primary) hypertension: Secondary | ICD-10-CM | POA: Diagnosis not present

## 2018-06-10 DIAGNOSIS — N401 Enlarged prostate with lower urinary tract symptoms: Secondary | ICD-10-CM | POA: Diagnosis not present

## 2018-06-10 DIAGNOSIS — I129 Hypertensive chronic kidney disease with stage 1 through stage 4 chronic kidney disease, or unspecified chronic kidney disease: Secondary | ICD-10-CM | POA: Diagnosis not present

## 2018-06-10 DIAGNOSIS — N390 Urinary tract infection, site not specified: Secondary | ICD-10-CM | POA: Diagnosis not present

## 2018-06-10 DIAGNOSIS — E1129 Type 2 diabetes mellitus with other diabetic kidney complication: Secondary | ICD-10-CM | POA: Diagnosis not present

## 2018-06-10 DIAGNOSIS — F0390 Unspecified dementia without behavioral disturbance: Secondary | ICD-10-CM | POA: Diagnosis not present

## 2018-06-11 DIAGNOSIS — N390 Urinary tract infection, site not specified: Secondary | ICD-10-CM | POA: Diagnosis not present

## 2018-06-11 DIAGNOSIS — E1122 Type 2 diabetes mellitus with diabetic chronic kidney disease: Secondary | ICD-10-CM | POA: Diagnosis not present

## 2018-06-11 DIAGNOSIS — N183 Chronic kidney disease, stage 3 (moderate): Secondary | ICD-10-CM | POA: Diagnosis not present

## 2018-06-11 DIAGNOSIS — I255 Ischemic cardiomyopathy: Secondary | ICD-10-CM | POA: Diagnosis not present

## 2018-06-11 DIAGNOSIS — I129 Hypertensive chronic kidney disease with stage 1 through stage 4 chronic kidney disease, or unspecified chronic kidney disease: Secondary | ICD-10-CM | POA: Diagnosis not present

## 2018-06-11 DIAGNOSIS — F0390 Unspecified dementia without behavioral disturbance: Secondary | ICD-10-CM | POA: Diagnosis not present

## 2018-06-12 DIAGNOSIS — N312 Flaccid neuropathic bladder, not elsewhere classified: Secondary | ICD-10-CM | POA: Diagnosis not present

## 2018-06-13 DIAGNOSIS — I129 Hypertensive chronic kidney disease with stage 1 through stage 4 chronic kidney disease, or unspecified chronic kidney disease: Secondary | ICD-10-CM | POA: Diagnosis not present

## 2018-06-13 DIAGNOSIS — N183 Chronic kidney disease, stage 3 (moderate): Secondary | ICD-10-CM | POA: Diagnosis not present

## 2018-06-13 DIAGNOSIS — I255 Ischemic cardiomyopathy: Secondary | ICD-10-CM | POA: Diagnosis not present

## 2018-06-13 DIAGNOSIS — F0390 Unspecified dementia without behavioral disturbance: Secondary | ICD-10-CM | POA: Diagnosis not present

## 2018-06-13 DIAGNOSIS — E1122 Type 2 diabetes mellitus with diabetic chronic kidney disease: Secondary | ICD-10-CM | POA: Diagnosis not present

## 2018-06-13 DIAGNOSIS — N390 Urinary tract infection, site not specified: Secondary | ICD-10-CM | POA: Diagnosis not present

## 2018-06-14 DIAGNOSIS — I129 Hypertensive chronic kidney disease with stage 1 through stage 4 chronic kidney disease, or unspecified chronic kidney disease: Secondary | ICD-10-CM | POA: Diagnosis not present

## 2018-06-14 DIAGNOSIS — N183 Chronic kidney disease, stage 3 (moderate): Secondary | ICD-10-CM | POA: Diagnosis not present

## 2018-06-14 DIAGNOSIS — I255 Ischemic cardiomyopathy: Secondary | ICD-10-CM | POA: Diagnosis not present

## 2018-06-14 DIAGNOSIS — E1122 Type 2 diabetes mellitus with diabetic chronic kidney disease: Secondary | ICD-10-CM | POA: Diagnosis not present

## 2018-06-14 DIAGNOSIS — F0390 Unspecified dementia without behavioral disturbance: Secondary | ICD-10-CM | POA: Diagnosis not present

## 2018-06-14 DIAGNOSIS — N390 Urinary tract infection, site not specified: Secondary | ICD-10-CM | POA: Diagnosis not present

## 2018-06-18 DIAGNOSIS — N390 Urinary tract infection, site not specified: Secondary | ICD-10-CM | POA: Diagnosis not present

## 2018-06-18 DIAGNOSIS — E1122 Type 2 diabetes mellitus with diabetic chronic kidney disease: Secondary | ICD-10-CM | POA: Diagnosis not present

## 2018-06-18 DIAGNOSIS — F0390 Unspecified dementia without behavioral disturbance: Secondary | ICD-10-CM | POA: Diagnosis not present

## 2018-06-18 DIAGNOSIS — I129 Hypertensive chronic kidney disease with stage 1 through stage 4 chronic kidney disease, or unspecified chronic kidney disease: Secondary | ICD-10-CM | POA: Diagnosis not present

## 2018-06-18 DIAGNOSIS — N183 Chronic kidney disease, stage 3 (moderate): Secondary | ICD-10-CM | POA: Diagnosis not present

## 2018-06-18 DIAGNOSIS — I255 Ischemic cardiomyopathy: Secondary | ICD-10-CM | POA: Diagnosis not present

## 2018-06-19 DIAGNOSIS — I129 Hypertensive chronic kidney disease with stage 1 through stage 4 chronic kidney disease, or unspecified chronic kidney disease: Secondary | ICD-10-CM | POA: Diagnosis not present

## 2018-06-19 DIAGNOSIS — I255 Ischemic cardiomyopathy: Secondary | ICD-10-CM | POA: Diagnosis not present

## 2018-06-19 DIAGNOSIS — N183 Chronic kidney disease, stage 3 (moderate): Secondary | ICD-10-CM | POA: Diagnosis not present

## 2018-06-19 DIAGNOSIS — F0390 Unspecified dementia without behavioral disturbance: Secondary | ICD-10-CM | POA: Diagnosis not present

## 2018-06-19 DIAGNOSIS — N390 Urinary tract infection, site not specified: Secondary | ICD-10-CM | POA: Diagnosis not present

## 2018-06-19 DIAGNOSIS — E1122 Type 2 diabetes mellitus with diabetic chronic kidney disease: Secondary | ICD-10-CM | POA: Diagnosis not present

## 2018-06-20 DIAGNOSIS — E1122 Type 2 diabetes mellitus with diabetic chronic kidney disease: Secondary | ICD-10-CM | POA: Diagnosis not present

## 2018-06-20 DIAGNOSIS — I129 Hypertensive chronic kidney disease with stage 1 through stage 4 chronic kidney disease, or unspecified chronic kidney disease: Secondary | ICD-10-CM | POA: Diagnosis not present

## 2018-06-20 DIAGNOSIS — F0390 Unspecified dementia without behavioral disturbance: Secondary | ICD-10-CM | POA: Diagnosis not present

## 2018-06-20 DIAGNOSIS — N183 Chronic kidney disease, stage 3 (moderate): Secondary | ICD-10-CM | POA: Diagnosis not present

## 2018-06-20 DIAGNOSIS — N390 Urinary tract infection, site not specified: Secondary | ICD-10-CM | POA: Diagnosis not present

## 2018-06-20 DIAGNOSIS — I255 Ischemic cardiomyopathy: Secondary | ICD-10-CM | POA: Diagnosis not present

## 2018-06-21 DIAGNOSIS — F0390 Unspecified dementia without behavioral disturbance: Secondary | ICD-10-CM | POA: Diagnosis not present

## 2018-06-21 DIAGNOSIS — I129 Hypertensive chronic kidney disease with stage 1 through stage 4 chronic kidney disease, or unspecified chronic kidney disease: Secondary | ICD-10-CM | POA: Diagnosis not present

## 2018-06-21 DIAGNOSIS — E1122 Type 2 diabetes mellitus with diabetic chronic kidney disease: Secondary | ICD-10-CM | POA: Diagnosis not present

## 2018-06-21 DIAGNOSIS — N3090 Cystitis, unspecified without hematuria: Secondary | ICD-10-CM | POA: Diagnosis not present

## 2018-06-21 DIAGNOSIS — N183 Chronic kidney disease, stage 3 (moderate): Secondary | ICD-10-CM | POA: Diagnosis not present

## 2018-06-21 DIAGNOSIS — I255 Ischemic cardiomyopathy: Secondary | ICD-10-CM | POA: Diagnosis not present

## 2018-06-21 DIAGNOSIS — N390 Urinary tract infection, site not specified: Secondary | ICD-10-CM | POA: Diagnosis not present

## 2018-06-24 ENCOUNTER — Other Ambulatory Visit: Payer: Self-pay

## 2018-06-24 ENCOUNTER — Emergency Department (HOSPITAL_COMMUNITY): Payer: Medicare Other

## 2018-06-24 ENCOUNTER — Emergency Department (HOSPITAL_COMMUNITY)
Admission: EM | Admit: 2018-06-24 | Discharge: 2018-06-24 | Disposition: A | Discharge status: Home / Self Care | Payer: Medicare Other | Attending: Emergency Medicine | Admitting: Emergency Medicine

## 2018-06-24 ENCOUNTER — Encounter (HOSPITAL_COMMUNITY): Payer: Self-pay

## 2018-06-24 DIAGNOSIS — I5022 Chronic systolic (congestive) heart failure: Secondary | ICD-10-CM | POA: Diagnosis not present

## 2018-06-24 DIAGNOSIS — N390 Urinary tract infection, site not specified: Secondary | ICD-10-CM | POA: Diagnosis not present

## 2018-06-24 DIAGNOSIS — N183 Chronic kidney disease, stage 3 (moderate): Secondary | ICD-10-CM | POA: Diagnosis not present

## 2018-06-24 DIAGNOSIS — I25810 Atherosclerosis of coronary artery bypass graft(s) without angina pectoris: Secondary | ICD-10-CM | POA: Insufficient documentation

## 2018-06-24 DIAGNOSIS — R509 Fever, unspecified: Secondary | ICD-10-CM | POA: Diagnosis not present

## 2018-06-24 DIAGNOSIS — I110 Hypertensive heart disease with heart failure: Secondary | ICD-10-CM | POA: Diagnosis not present

## 2018-06-24 DIAGNOSIS — K310 Acute dilatation of stomach: Secondary | ICD-10-CM | POA: Diagnosis present

## 2018-06-24 DIAGNOSIS — Z79899 Other long term (current) drug therapy: Secondary | ICD-10-CM | POA: Diagnosis not present

## 2018-06-24 DIAGNOSIS — R109 Unspecified abdominal pain: Secondary | ICD-10-CM | POA: Diagnosis not present

## 2018-06-24 DIAGNOSIS — E1122 Type 2 diabetes mellitus with diabetic chronic kidney disease: Secondary | ICD-10-CM | POA: Insufficient documentation

## 2018-06-24 DIAGNOSIS — R140 Abdominal distension (gaseous): Secondary | ICD-10-CM | POA: Diagnosis not present

## 2018-06-24 DIAGNOSIS — I130 Hypertensive heart and chronic kidney disease with heart failure and stage 1 through stage 4 chronic kidney disease, or unspecified chronic kidney disease: Secondary | ICD-10-CM | POA: Insufficient documentation

## 2018-06-24 DIAGNOSIS — Z950 Presence of cardiac pacemaker: Secondary | ICD-10-CM | POA: Diagnosis not present

## 2018-06-24 DIAGNOSIS — F0390 Unspecified dementia without behavioral disturbance: Secondary | ICD-10-CM | POA: Insufficient documentation

## 2018-06-24 DIAGNOSIS — I4821 Permanent atrial fibrillation: Secondary | ICD-10-CM | POA: Insufficient documentation

## 2018-06-24 DIAGNOSIS — Z7984 Long term (current) use of oral hypoglycemic drugs: Secondary | ICD-10-CM | POA: Diagnosis not present

## 2018-06-24 DIAGNOSIS — I255 Ischemic cardiomyopathy: Secondary | ICD-10-CM | POA: Diagnosis not present

## 2018-06-24 DIAGNOSIS — I509 Heart failure, unspecified: Secondary | ICD-10-CM | POA: Diagnosis not present

## 2018-06-24 DIAGNOSIS — I129 Hypertensive chronic kidney disease with stage 1 through stage 4 chronic kidney disease, or unspecified chronic kidney disease: Secondary | ICD-10-CM | POA: Diagnosis not present

## 2018-06-24 LAB — CBC
HCT: 35.5 % — ABNORMAL LOW (ref 39.0–52.0)
Hemoglobin: 11.3 g/dL — ABNORMAL LOW (ref 13.0–17.0)
MCH: 27.4 pg (ref 26.0–34.0)
MCHC: 31.8 g/dL (ref 30.0–36.0)
MCV: 86.2 fL (ref 80.0–100.0)
PLATELETS: 223 10*3/uL (ref 150–400)
RBC: 4.12 MIL/uL — AB (ref 4.22–5.81)
RDW: 17.3 % — ABNORMAL HIGH (ref 11.5–15.5)
WBC: 8.3 10*3/uL (ref 4.0–10.5)
nRBC: 0 % (ref 0.0–0.2)

## 2018-06-24 LAB — BASIC METABOLIC PANEL
ANION GAP: 10 (ref 5–15)
BUN: 34 mg/dL — ABNORMAL HIGH (ref 8–23)
CALCIUM: 9.1 mg/dL (ref 8.9–10.3)
CO2: 23 mmol/L (ref 22–32)
Chloride: 104 mmol/L (ref 98–111)
Creatinine, Ser: 1.19 mg/dL (ref 0.61–1.24)
GFR, EST NON AFRICAN AMERICAN: 55 mL/min — AB (ref >60)
GLUCOSE: 131 mg/dL — AB (ref 70–99)
Potassium: 4.2 mmol/L (ref 3.5–5.1)
SODIUM: 137 mmol/L (ref 135–145)

## 2018-06-24 LAB — POCT I-STAT TROPONIN I: TROPONIN I, POC: 0.02 ng/mL (ref 0.00–0.08)

## 2018-06-24 MED ORDER — FUROSEMIDE 10 MG/ML IJ SOLN
40.0000 mg | Freq: Once | INTRAMUSCULAR | Status: AC
  Administered 2018-06-24: 40 mg via INTRAVENOUS
  Filled 2018-06-24: qty 4

## 2018-06-24 MED ORDER — FUROSEMIDE 20 MG PO TABS: 40.0000 mg | ORAL_TABLET | ORAL | 0 refills | Status: DC | PRN

## 2018-06-24 NOTE — Discharge Instructions (Addendum)
Follow-up with his doctor.  Take the Lasix every other day for 2 doses.

## 2018-06-24 NOTE — ED Provider Notes (Signed)
[facility] DEPT Provider Note   CSN: [id number] Arrival date & time: 06/24/18  1901     History   Chief Complaint Chief Complaint  Patient presents with  . abdominal distention  . Leg Swelling  . Congestive Heart Failure    HPI Neil [name] is a 84 y.o. male.  HPI Level 5 caveat due to dementia. Patient presents with swelling of his legs and abdomen.  History of CHF.  Has been on and off Lasix.  Worse swelling in his abdomen is become more distended.  Still having bowel movements.  No vomiting.  Has been eating a little less however.  Permanent A. fib.  Has chronic Foley catheter and is still had output. Past Medical History:  Diagnosis Date  . Chronic kidney disease (CKD), stage III (moderate) (HCC)   . Chronic systolic CHF (congestive heart failure) ([place])   . Complete heart block ([place])   . Coronary artery disease    status post [id number]  . Dementia ([place])   . Dyslipidemia   . Foley catheter in place   . Hypertension   . Ischemic cardiomyopathy    per last echo 11-10-2017  ef 15-20%  . Osteoarthritis   . Permanent atrial fibrillation   . Presence of permanent cardiac pacemaker cardiologist-  dr Samuel Bouche Jude status post generator replacement 2009 (original placement 02/ 1998)  . Renal artery stenosis (HCC)    95%  left, 50% right  . Requires assistance with activities of daily living (ADL)   . 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 Type 2 diabetes mellitus ([place])   . Urinary retention   . Uses wheelchair     Patient Active Problem List   Diagnosis Date Noted  . Systolic congestive heart failure ([place])   . Palliative care encounter   . Goals of care, counseling/discussion   . Aspiration pneumonia ([place]) 02/25/2018  . UTI (urinary tract infection) 02/25/2018  . Protein-calorie malnutrition, severe 12/18/2017  . Hypoglycemia due to type 2 diabetes mellitus ([place]) 12/17/2017  . Confusion   . General weakness   . Dehydration 11/09/2017  .  FTT (failure to thrive) in adult 11/09/2017  . AKI (acute kidney injury) ([place]) 11/09/2017  . Dementia ([place]) 11/09/2017  . Renal artery stenosis ([place])   . CKD (chronic kidney disease) stage 3, GFR 30-59 ml/min (HCC)   . Pacemaker-St. Jude 04/20/2011  . Permanent atrial fibrillation 05/03/2010  . AV BLOCK, COMPLETE 11/02/2009  . ABDOMINAL PAIN-EPIGASTRIC 10/27/2008  . AODM 07/25/2008  . Type 2 diabetes mellitus with hyperlipidemia ([place]) 07/25/2008  . Cardiomyopathy, ischemic 07/25/2008  . ATHEROSCLEROSIS OF RENAL ARTERY 07/25/2008  . HYPERTENSION, BENIGN 07/25/2008    Past Surgical History:  Procedure Laterality Date  .  suspicous skin lesions-nose and upper back-removed 04/15/18    . CARDIOVASCULAR STRESS TEST  05-22-2012   dr Caryl Comes   Low risk adenosine no exercise nuclear study w/ no ischemia/  ef 31%,  LV moderately enlarged , global hypokinesis and severe hypokinesis of the inferior wall  . CORONARY ANGIOPLASTY WITH STENT PLACEMENT  spring 1998   stenting  . CORONARY ARTERY BYPASS GRAFT  fall 1998   x4  . INSERTION OF SUPRAPUBIC CATHETER N/A 04/16/2018   Procedure: INSERTION OF SUPRAPUBIC CATHETER;  Surgeon: Irine Seal, MD;  Location: [facility] ORS;  Service: Urology;  Laterality: N/A;  . PACEMAKER GENERATOR CHANGE  10-30-2007  dr Samuel Bouche Jude dual chamber  . PACEMAKER INSERTION  02/  1998   dr Caryl Comes  . skin lesions    . TRANSTHORACIC ECHOCARDIOGRAM  11/10/2017   mild LVH, ef 15-20%, diffuse hypokinesis, LVDF not evaluated/  mild AS/  severe LAE/  RVSF severely reduced/  mild MR, PR, and TR/  PASP 3mmHg        Home Medications    Prior to Admission medications   Medication Sig Start Date End Date Taking? Authorizing Provider  albuterol (PROVENTIL) (2.5 MG/3ML) 0.083% nebulizer solution Take 3 mLs (2.5 mg total) by nebulization every 6 (six) hours as needed for wheezing or shortness of breath. 02/27/18   Kathie Dike, MD  ALPRAZolam Duanne Moron) 0.25 MG tablet Take 0.25 mg by  mouth at bedtime.    [provider]  atorvastatin (LIPITOR) 40 MG tablet Take 40 mg by mouth daily.      [provider]  ciprofloxacin (CIPRO) 500 MG tablet Take 1 tablet by mouth 2 (two) times daily. 05/23/18   [provider]  dabigatran (PRADAXA) 75 MG CAPS capsule Take 75 mg by mouth daily.    [provider]  feeding supplement, ENSURE ENLIVE, (ENSURE ENLIVE) LIQD Take 237 mLs by mouth 2 (two) times daily between meals. 12/18/17   Manuella Ghazi, Pratik D, DO  FEROSUL 325 (65 Fe) MG tablet Take 1 tablet by mouth daily. 03/14/18   [provider]  furosemide (LASIX) 20 MG tablet Take 2 tablets (40 mg total) by mouth every other day as needed for fluid or edema. 06/24/18   Davonna Belling, MD  glipiZIDE (GLUCOTROL XL) 2.5 MG 24 hr tablet Take 1 tablet by mouth every evening.  03/12/18   [provider]  levothyroxine (SYNTHROID, LEVOTHROID) 25 MCG tablet Take 25 mcg by mouth daily. 04/13/18   [provider]  Maltodextrin-Xanthan Gum (RESOURCE THICKENUP CLEAR) POWD Thicken fluids to nectar thick 02/27/18   Kathie Dike, MD  pantoprazole (PROTONIX) 40 MG tablet Take 40 mg by mouth daily.    [provider]  polyethylene glycol (MIRALAX) packet Take 17 g by mouth daily. Patient taking differently: Take 17 g by mouth daily as needed for moderate constipation.  03/03/18   Fransico Meadow, PA-C  rOPINIRole (REQUIP) 0.5 MG tablet Take 0.5 mg by mouth at bedtime.     [provider]    Family History Family History  Problem Relation Age of Onset  . Other Mother   . Cancer Mother        breast  . Alzheimer's disease Sister     Social History Social History   Tobacco Use  . Smoking status: Never Smoker  . Smokeless tobacco: Never Used  Substance Use Topics  . Alcohol use: No  . Drug use: No     Allergies   Cephalexin; Penicillin g; and Penicillins   Review of Systems Review of Systems  Unable to perform ROS:  Dementia     Physical Exam Updated Vital Signs BP 135/72   Pulse 77   Temp 98.1 F (36.7 C) (Oral)   Resp 16   Ht 5\' 10"  (1.778 m)   Wt 69.1 kg   SpO2 99%   BMI 21.87 kg/m   Physical Exam HENT:     Head: Atraumatic.  Neck:     Musculoskeletal: Neck supple.  Cardiovascular:     Rate and Rhythm: Normal rate.  Pulmonary:     Comments: Few rales at bases. Abdominal:     General: There is no distension.     Tenderness: There is  no abdominal tenderness.  Musculoskeletal:     Right lower leg: Edema present.     Left lower leg: Edema present.     Comments: Pitting edema bilateral lower extremities.  Skin:    General: Skin is warm.     Capillary Refill: Capillary refill takes less than 2 seconds.  Neurological:     Mental Status: He is alert.     Comments: Awake and pleasant but is confused baseline.      ED Treatments / Results  Labs (all labs ordered are listed, but only abnormal results are displayed) Labs Reviewed  BASIC METABOLIC PANEL - Abnormal; Notable for the following components:      Result Value   Glucose, Bld 131 (*)    BUN 34 (*)    GFR calc non Af Amer 55 (*)    All other components within normal limits  CBC - Abnormal; Notable for the following components:   RBC 4.12 (*)    Hemoglobin 11.3 (*)    HCT 35.5 (*)    RDW 17.3 (*)    All other components within normal limits  I-STAT TROPONIN, ED  POCT I-STAT TROPONIN I    EKG EKG Interpretation  Date/Time:  Monday June 24 2018 21:06:02 EST Ventricular Rate:  83 PR Interval:    QRS Duration: 176 QT Interval:  429 QTC Calculation: 619 R Axis:   -59 Text Interpretation:  Ventricular-paced complexes No further analysis attempted due to paced rhythm Confirmed by Davonna Belling [phone]) on 06/24/2018 11:45:53 PM   Radiology Dg Chest 2 View  Result Date: 06/24/2018 CLINICAL DATA:  Shortness of breath EXAM: CHEST - 2 VIEW COMPARISON:  05/24/2018 FINDINGS: Cardiac shadow is enlarged but  stable. Postsurgical changes are seen. Pacing device is again noted and stable. Small bilateral pleural effusions as well as bibasilar atelectasis is seen. Mild central vascular congestion is noted. IMPRESSION: Changes of mild CHF with bibasilar atelectasis. Electronically Signed   By: Inez Catalina M.D.   On: 06/24/2018 20:35   Dg Abd 2 Views  Result Date: 06/24/2018 CLINICAL DATA:  Abdominal distension EXAM: ABDOMEN - 2 VIEW COMPARISON:  None. FINDINGS: The bowel gas pattern is normal. There is no evidence of free air. No radio-opaque calculi or other significant radiographic abnormality is seen. Moderate amount of stool in the proximal colon. IMPRESSION: Moderate proximal colonic stool volume. Electronically Signed   By: Ulyses Jarred M.D.   On: 06/24/2018 22:44    Procedures Procedures (including critical care time)  Medications Ordered in ED Medications  furosemide (LASIX) injection 40 mg (40 mg Intravenous Given 06/24/18 2245)     Initial Impression / Assessment and Plan / ED Course  I have reviewed the triage vital signs and the nursing notes.  Pertinent labs & imaging results that were available during my care of the patient were reviewed by me and considered in my medical decision making (see chart for details).     Patient with swelling of his legs.  Mild CHF on x-ray.  I think likely is volume overloaded.  Will give short course of Lasix.  Has had in the past but had to be stopped because of dehydration.  Follow-up with PCP.  Benign abdominal exam.  Final Clinical Impressions(s) / ED Diagnoses   Final diagnoses:  Abdominal pain  Congestive heart failure, unspecified HF chronicity, unspecified heart failure type [facility])    ED Discharge Orders         Ordered    furosemide (LASIX) 20  MG tablet  Every 48 hours PRN     06/24/18 2311           Davonna Belling, MD 06/24/18 2346

## 2018-06-24 NOTE — ED Triage Notes (Signed)
Pt BIB by goddtr and spouse. Pt is having abdominal distention and LE edema x 2 days. Pt has hx of CHF. Pt has foley Cath in place as well

## 2018-06-25 DIAGNOSIS — F0390 Unspecified dementia without behavioral disturbance: Secondary | ICD-10-CM | POA: Diagnosis not present

## 2018-06-25 DIAGNOSIS — I255 Ischemic cardiomyopathy: Secondary | ICD-10-CM | POA: Diagnosis not present

## 2018-06-25 DIAGNOSIS — I129 Hypertensive chronic kidney disease with stage 1 through stage 4 chronic kidney disease, or unspecified chronic kidney disease: Secondary | ICD-10-CM | POA: Diagnosis not present

## 2018-06-25 DIAGNOSIS — E1122 Type 2 diabetes mellitus with diabetic chronic kidney disease: Secondary | ICD-10-CM | POA: Diagnosis not present

## 2018-06-25 DIAGNOSIS — N183 Chronic kidney disease, stage 3 (moderate): Secondary | ICD-10-CM | POA: Diagnosis not present

## 2018-06-25 DIAGNOSIS — N390 Urinary tract infection, site not specified: Secondary | ICD-10-CM | POA: Diagnosis not present

## 2018-06-27 DIAGNOSIS — N390 Urinary tract infection, site not specified: Secondary | ICD-10-CM | POA: Diagnosis not present

## 2018-06-27 DIAGNOSIS — I129 Hypertensive chronic kidney disease with stage 1 through stage 4 chronic kidney disease, or unspecified chronic kidney disease: Secondary | ICD-10-CM | POA: Diagnosis not present

## 2018-06-27 DIAGNOSIS — E1122 Type 2 diabetes mellitus with diabetic chronic kidney disease: Secondary | ICD-10-CM | POA: Diagnosis not present

## 2018-06-27 DIAGNOSIS — I255 Ischemic cardiomyopathy: Secondary | ICD-10-CM | POA: Diagnosis not present

## 2018-06-27 DIAGNOSIS — N183 Chronic kidney disease, stage 3 (moderate): Secondary | ICD-10-CM | POA: Diagnosis not present

## 2018-06-27 DIAGNOSIS — F0390 Unspecified dementia without behavioral disturbance: Secondary | ICD-10-CM | POA: Diagnosis not present

## 2018-06-28 ENCOUNTER — Other Ambulatory Visit: Payer: Self-pay

## 2018-06-28 DIAGNOSIS — D631 Anemia in chronic kidney disease: Secondary | ICD-10-CM | POA: Diagnosis not present

## 2018-06-28 DIAGNOSIS — E1169 Type 2 diabetes mellitus with other specified complication: Secondary | ICD-10-CM | POA: Diagnosis not present

## 2018-06-28 DIAGNOSIS — E43 Unspecified severe protein-calorie malnutrition: Secondary | ICD-10-CM | POA: Diagnosis not present

## 2018-06-28 DIAGNOSIS — Z681 Body mass index (BMI) 19 or less, adult: Secondary | ICD-10-CM | POA: Diagnosis not present

## 2018-06-28 DIAGNOSIS — I442 Atrioventricular block, complete: Secondary | ICD-10-CM | POA: Diagnosis not present

## 2018-06-28 DIAGNOSIS — Z950 Presence of cardiac pacemaker: Secondary | ICD-10-CM | POA: Diagnosis not present

## 2018-06-28 DIAGNOSIS — I2510 Atherosclerotic heart disease of native coronary artery without angina pectoris: Secondary | ICD-10-CM | POA: Diagnosis not present

## 2018-06-28 DIAGNOSIS — N183 Chronic kidney disease, stage 3 (moderate): Secondary | ICD-10-CM | POA: Diagnosis not present

## 2018-06-28 DIAGNOSIS — E1122 Type 2 diabetes mellitus with diabetic chronic kidney disease: Secondary | ICD-10-CM | POA: Diagnosis not present

## 2018-06-28 DIAGNOSIS — I701 Atherosclerosis of renal artery: Secondary | ICD-10-CM | POA: Diagnosis not present

## 2018-06-28 DIAGNOSIS — I130 Hypertensive heart and chronic kidney disease with heart failure and stage 1 through stage 4 chronic kidney disease, or unspecified chronic kidney disease: Secondary | ICD-10-CM | POA: Diagnosis not present

## 2018-06-28 DIAGNOSIS — N390 Urinary tract infection, site not specified: Secondary | ICD-10-CM | POA: Diagnosis not present

## 2018-06-28 DIAGNOSIS — D539 Nutritional anemia, unspecified: Secondary | ICD-10-CM | POA: Diagnosis not present

## 2018-06-28 DIAGNOSIS — R627 Adult failure to thrive: Secondary | ICD-10-CM | POA: Diagnosis not present

## 2018-06-28 DIAGNOSIS — I255 Ischemic cardiomyopathy: Secondary | ICD-10-CM | POA: Diagnosis not present

## 2018-06-28 DIAGNOSIS — I10 Essential (primary) hypertension: Secondary | ICD-10-CM | POA: Diagnosis not present

## 2018-06-28 DIAGNOSIS — Z960 Presence of urogenital implants: Secondary | ICD-10-CM | POA: Diagnosis not present

## 2018-06-28 DIAGNOSIS — I4891 Unspecified atrial fibrillation: Secondary | ICD-10-CM | POA: Diagnosis not present

## 2018-06-28 DIAGNOSIS — E785 Hyperlipidemia, unspecified: Secondary | ICD-10-CM | POA: Diagnosis not present

## 2018-06-28 DIAGNOSIS — Z951 Presence of aortocoronary bypass graft: Secondary | ICD-10-CM | POA: Diagnosis not present

## 2018-06-28 DIAGNOSIS — I5022 Chronic systolic (congestive) heart failure: Secondary | ICD-10-CM | POA: Diagnosis not present

## 2018-06-28 DIAGNOSIS — I4821 Permanent atrial fibrillation: Secondary | ICD-10-CM | POA: Diagnosis not present

## 2018-06-28 DIAGNOSIS — F0390 Unspecified dementia without behavioral disturbance: Secondary | ICD-10-CM | POA: Diagnosis not present

## 2018-06-28 DIAGNOSIS — E1129 Type 2 diabetes mellitus with other diabetic kidney complication: Secondary | ICD-10-CM | POA: Diagnosis not present

## 2018-06-28 DIAGNOSIS — Z87440 Personal history of urinary (tract) infections: Secondary | ICD-10-CM | POA: Diagnosis not present

## 2018-06-28 DIAGNOSIS — I5032 Chronic diastolic (congestive) heart failure: Secondary | ICD-10-CM | POA: Diagnosis not present

## 2018-06-28 NOTE — Patient Outreach (Signed)
[facility]) Care Management  06/28/2018  Neil [name] [birth year] [id number]   Successful outreach with Neil [name]'s caregiver [name]. She reported that Neil [name] was doing well. Reports that she is administering his medications as prescribed, and following diet recommendations. Reports that he is very active but the family if following the recommended safety precautions. No falls reported. He continues to receive physical therapy, speech therapy and nursing services with [facility]. Reported no urgent concerns but expressed interest in weighing Neil [name] in the home. Not previously taking weights due to member's high fall risk. Will follow up to discuss options and availability of a Wheelchair scale.   [facility] CM Care Plan Problem One     Most Recent Value  Care Plan Problem One  High Risk for falls due to mobility and cognitive impairment  Role Documenting the Problem One  Care Management [place] for Problem One  Active  [facility] Long Term Goal   Patient will not sustain any injuries related to falls over the next 90 days.  [facility] Long Term Goal Start Date  01/07/18  [facility] Long Term Goal Met Date  04/17/18  [facility] CM Short Term Goal #1   Over the next 30 days family members and caretakers will utilize assistive devices(wheelchair, walker) when assisting patient to ambulate inside and outside of the home.   [facility] CM Short Term Goal #1 Start Date  01/07/18  [facility] CM Short Term Goal #1 Met Date  03/19/18  [facility] CM Short Term Goal #2   Over the next 30 days caregivers will ensure bed is in the lowest position and bedrails are up when patient is in bed.  [facility] CM Short Term Goal #2 Start Date  01/07/18  [facility] CM Short Term Goal #2 Met Date  03/19/18    [facility] CM Care Plan Problem Two     Most Recent Value  Care Plan Problem Two  Risk for readmission  Role Documenting the Problem Two  Care Management Coordinator  Care Plan for Problem Two  Active  [facility] Long Term Goal  Over the  next 90 days patient will not be hospitalized due to chronic disease complications.  [facility] Long Term Goal Start Date  01/07/18  [facility] CM Short Term Goal #1   Over the next 30 days patient will take medications as prescribed.  [facility] CM Short Term Goal #1 Start Date  01/07/18  [facility] CM Short Term Goal #1 Met Date   03/19/18  [facility] CM Short Term Goal #2   Over the next 30 days caregiver will ensure member attends MD appointments as scheduled.  [facility] CM Short Term Goal #2 Start Date  01/07/18  [facility] CM Short Term Goal #2 Met Date  03/19/18    [facility] CM Care Plan Problem Three     Most Recent Value  Care Plan Problem Three  High risk for readmission.  Role Documenting the Problem Three  Care Management [place] for Problem Three  Active  [facility] Long Term Goal   Patient will not be hospitalized over the next 45 days.  [facility] Long Term Goal Start Date  06/28/18  Interventions for Problem Three Long Term Goal  Discussed medication, nutrition, safety precautions, and importance of monitoring urine output. Provided education regarding CHF complications and indications for notifying MD.  [facility] CM Short Term Goal #1   Over the next 30 days caregivers will take patient to all scheduled MD appointments  [facility] CM Short  Term Goal #1 Start Date  06/28/18  Interventions for Short Term Goal #1  Reviewed pending appointments. Reviewed symptoms that require MD notification.  [facility] CM Short Term Goal #2   Over the next 30 days caregivers will verbalize knowledge of s/sx of CHF complications.  [facility] CM Short Term Goal #2 Start Date  06/28/18  Interventions for Short Term Goal #2  Reviewed s/sx of CHF complications.      PLAN Will follow up on next week.   ,RN [facility] Community Care Manager [phone]         

## 2018-07-01 DIAGNOSIS — I5022 Chronic systolic (congestive) heart failure: Secondary | ICD-10-CM | POA: Diagnosis not present

## 2018-07-01 DIAGNOSIS — I2510 Atherosclerotic heart disease of native coronary artery without angina pectoris: Secondary | ICD-10-CM | POA: Diagnosis not present

## 2018-07-01 DIAGNOSIS — I130 Hypertensive heart and chronic kidney disease with heart failure and stage 1 through stage 4 chronic kidney disease, or unspecified chronic kidney disease: Secondary | ICD-10-CM | POA: Diagnosis not present

## 2018-07-01 DIAGNOSIS — I255 Ischemic cardiomyopathy: Secondary | ICD-10-CM | POA: Diagnosis not present

## 2018-07-01 DIAGNOSIS — F0390 Unspecified dementia without behavioral disturbance: Secondary | ICD-10-CM | POA: Diagnosis not present

## 2018-07-01 DIAGNOSIS — E1122 Type 2 diabetes mellitus with diabetic chronic kidney disease: Secondary | ICD-10-CM | POA: Diagnosis not present

## 2018-07-05 DIAGNOSIS — I130 Hypertensive heart and chronic kidney disease with heart failure and stage 1 through stage 4 chronic kidney disease, or unspecified chronic kidney disease: Secondary | ICD-10-CM | POA: Diagnosis not present

## 2018-07-05 DIAGNOSIS — I255 Ischemic cardiomyopathy: Secondary | ICD-10-CM | POA: Diagnosis not present

## 2018-07-05 DIAGNOSIS — I2510 Atherosclerotic heart disease of native coronary artery without angina pectoris: Secondary | ICD-10-CM | POA: Diagnosis not present

## 2018-07-05 DIAGNOSIS — I5022 Chronic systolic (congestive) heart failure: Secondary | ICD-10-CM | POA: Diagnosis not present

## 2018-07-05 DIAGNOSIS — F0390 Unspecified dementia without behavioral disturbance: Secondary | ICD-10-CM | POA: Diagnosis not present

## 2018-07-05 DIAGNOSIS — E1122 Type 2 diabetes mellitus with diabetic chronic kidney disease: Secondary | ICD-10-CM | POA: Diagnosis not present

## 2018-07-06 ENCOUNTER — Emergency Department (HOSPITAL_COMMUNITY): Payer: Medicare Other

## 2018-07-06 ENCOUNTER — Other Ambulatory Visit: Payer: Self-pay

## 2018-07-06 ENCOUNTER — Encounter (HOSPITAL_COMMUNITY): Payer: Self-pay

## 2018-07-06 ENCOUNTER — Emergency Department (HOSPITAL_COMMUNITY)
Admission: EM | Admit: 2018-07-06 | Discharge: 2018-07-06 | Disposition: A | Discharge status: Home / Self Care | Payer: Medicare Other | Attending: Emergency Medicine | Admitting: Emergency Medicine

## 2018-07-06 DIAGNOSIS — N183 Chronic kidney disease, stage 3 (moderate): Secondary | ICD-10-CM | POA: Diagnosis not present

## 2018-07-06 DIAGNOSIS — R600 Localized edema: Secondary | ICD-10-CM | POA: Diagnosis not present

## 2018-07-06 DIAGNOSIS — Z951 Presence of aortocoronary bypass graft: Secondary | ICD-10-CM | POA: Insufficient documentation

## 2018-07-06 DIAGNOSIS — Z950 Presence of cardiac pacemaker: Secondary | ICD-10-CM | POA: Insufficient documentation

## 2018-07-06 DIAGNOSIS — I5023 Acute on chronic systolic (congestive) heart failure: Secondary | ICD-10-CM | POA: Diagnosis not present

## 2018-07-06 DIAGNOSIS — I5022 Chronic systolic (congestive) heart failure: Secondary | ICD-10-CM | POA: Diagnosis not present

## 2018-07-06 DIAGNOSIS — Z960 Presence of urogenital implants: Secondary | ICD-10-CM | POA: Insufficient documentation

## 2018-07-06 DIAGNOSIS — F0390 Unspecified dementia without behavioral disturbance: Secondary | ICD-10-CM | POA: Insufficient documentation

## 2018-07-06 DIAGNOSIS — R05 Cough: Secondary | ICD-10-CM | POA: Diagnosis not present

## 2018-07-06 DIAGNOSIS — Z7984 Long term (current) use of oral hypoglycemic drugs: Secondary | ICD-10-CM | POA: Diagnosis not present

## 2018-07-06 DIAGNOSIS — I130 Hypertensive heart and chronic kidney disease with heart failure and stage 1 through stage 4 chronic kidney disease, or unspecified chronic kidney disease: Secondary | ICD-10-CM | POA: Diagnosis not present

## 2018-07-06 DIAGNOSIS — I110 Hypertensive heart disease with heart failure: Secondary | ICD-10-CM | POA: Diagnosis not present

## 2018-07-06 DIAGNOSIS — E1122 Type 2 diabetes mellitus with diabetic chronic kidney disease: Secondary | ICD-10-CM | POA: Insufficient documentation

## 2018-07-06 DIAGNOSIS — I2510 Atherosclerotic heart disease of native coronary artery without angina pectoris: Secondary | ICD-10-CM | POA: Insufficient documentation

## 2018-07-06 DIAGNOSIS — I509 Heart failure, unspecified: Secondary | ICD-10-CM | POA: Diagnosis not present

## 2018-07-06 DIAGNOSIS — Z79899 Other long term (current) drug therapy: Secondary | ICD-10-CM | POA: Insufficient documentation

## 2018-07-06 LAB — CBC
HEMATOCRIT: 36.4 % — AB (ref 39.0–52.0)
Hemoglobin: 11.3 g/dL — ABNORMAL LOW (ref 13.0–17.0)
MCH: 27.5 pg (ref 26.0–34.0)
MCHC: 31 g/dL (ref 30.0–36.0)
MCV: 88.6 fL (ref 80.0–100.0)
Platelets: 184 10*3/uL (ref 150–400)
RBC: 4.11 MIL/uL — ABNORMAL LOW (ref 4.22–5.81)
RDW: 17 % — ABNORMAL HIGH (ref 11.5–15.5)
WBC: 5.7 10*3/uL (ref 4.0–10.5)
nRBC: 0 % (ref 0.0–0.2)

## 2018-07-06 LAB — BASIC METABOLIC PANEL
Anion gap: 7 (ref 5–15)
BUN: 31 mg/dL — ABNORMAL HIGH (ref 8–23)
CHLORIDE: 106 mmol/L (ref 98–111)
CO2: 27 mmol/L (ref 22–32)
Calcium: 9 mg/dL (ref 8.9–10.3)
Creatinine, Ser: 1.18 mg/dL (ref 0.61–1.24)
GFR calc Af Amer: >60 mL/min (ref >60)
GFR calc non Af Amer: 55 mL/min — ABNORMAL LOW (ref >60)
Glucose, Bld: 160 mg/dL — ABNORMAL HIGH (ref 70–99)
POTASSIUM: 4.4 mmol/L (ref 3.5–5.1)
Sodium: 140 mmol/L (ref 135–145)

## 2018-07-06 LAB — HEPATIC FUNCTION PANEL
ALT: 10 U/L (ref 0–44)
AST: 16 U/L (ref 15–41)
Albumin: 3.2 g/dL — ABNORMAL LOW (ref 3.5–5.0)
Alkaline Phosphatase: 68 U/L (ref 38–126)
Bilirubin, Direct: 0.2 mg/dL (ref 0.0–0.2)
Indirect Bilirubin: 0.8 mg/dL (ref 0.3–0.9)
Total Bilirubin: 1 mg/dL (ref 0.3–1.2)
Total Protein: 6.3 g/dL — ABNORMAL LOW (ref 6.5–8.1)

## 2018-07-06 LAB — BRAIN NATRIURETIC PEPTIDE: B Natriuretic Peptide: 441.7 pg/mL — ABNORMAL HIGH (ref 0.0–100.0)

## 2018-07-06 LAB — POCT I-STAT TROPONIN I: Troponin i, poc: 0.01 ng/mL (ref 0.00–0.08)

## 2018-07-06 MED ORDER — FUROSEMIDE 20 MG PO TABS: 20.0000 mg | ORAL_TABLET | ORAL | 0 refills | Status: DC

## 2018-07-06 MED ORDER — FUROSEMIDE 10 MG/ML IJ SOLN
20.0000 mg | INTRAMUSCULAR | Status: AC
  Administered 2018-07-06: 20 mg via INTRAVENOUS
  Filled 2018-07-06: qty 4

## 2018-07-06 NOTE — ED Notes (Signed)
Talked with pts family about wait and that we have back pts lab/xray results.

## 2018-07-06 NOTE — ED Provider Notes (Signed)
[facility] DEPT Provider Note   CSN: [id number] Arrival date & time: 07/06/18  1317     History   Chief Complaint Chief Complaint  Patient presents with  . generalized edema  . Cough    HPI Neil [name] is a 84 y.o. male.  84 year old male with extensive past medical history including atrial fibrillation, CHF, CKD, CABG, dementia, suprapubic catheter who presents with edema.  Caregiver reports that this morning she noticed that his hands and feet were swollen and his abdomen appeared more swollen than usual.  He had problems with edema on 12/16 when he came to the ER and was suspected to have mild CHF exacerbation.  At that time, they were instructed to give him 20 mg Lasix every other day which they had been doing.  Approximately 1 week ago, he saw a PA in the urology clinic to have his catheter checked and they were told to discontinue the Lasix.  He has otherwise been acting baseline with normal urination and no problems with Foley catheter. LEVEL 5 CAVEAT DUE TO DEMENTIA  The history is provided by the spouse and a caregiver.  Cough     Past Medical History:  Diagnosis Date  . Chronic kidney disease (CKD), stage III (moderate) (HCC)   . Chronic systolic CHF (congestive heart failure) ([place])   . Complete heart block ([place])   . Coronary artery disease    status post [id number]  . Dementia ([place])   . Dyslipidemia   . Foley catheter in place   . Hypertension   . Ischemic cardiomyopathy    per last echo 11-10-2017  ef 15-20%  . Osteoarthritis   . Permanent atrial fibrillation   . Presence of permanent cardiac pacemaker cardiologist-  dr Samuel Bouche Jude status post generator replacement 2009 (original placement 02/ 1998)  . Renal artery stenosis (HCC)    95%  left, 50% right  . Requires assistance with activities of daily living (ADL)   . 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 Type 2 diabetes mellitus ([place])   . Urinary retention   . Uses wheelchair      Patient Active Problem List   Diagnosis Date Noted  . Systolic congestive heart failure ([place])   . Palliative care encounter   . Goals of care, counseling/discussion   . Aspiration pneumonia ([place]) 02/25/2018  . UTI (urinary tract infection) 02/25/2018  . Protein-calorie malnutrition, severe 12/18/2017  . Hypoglycemia due to type 2 diabetes mellitus ([place]) 12/17/2017  . Confusion   . General weakness   . Dehydration 11/09/2017  . FTT (failure to thrive) in adult 11/09/2017  . AKI (acute kidney injury) ([place]) 11/09/2017  . Dementia ([place]) 11/09/2017  . Renal artery stenosis ([place])   . CKD (chronic kidney disease) stage 3, GFR 30-59 ml/min (HCC)   . Pacemaker-St. Jude 04/20/2011  . Permanent atrial fibrillation 05/03/2010  . AV BLOCK, COMPLETE 11/02/2009  . ABDOMINAL PAIN-EPIGASTRIC 10/27/2008  . AODM 07/25/2008  . Type 2 diabetes mellitus with hyperlipidemia ([place]) 07/25/2008  . Cardiomyopathy, ischemic 07/25/2008  . ATHEROSCLEROSIS OF RENAL ARTERY 07/25/2008  . HYPERTENSION, BENIGN 07/25/2008    Past Surgical History:  Procedure Laterality Date  .  suspicous skin lesions-nose and upper back-removed 04/15/18    . CARDIOVASCULAR STRESS TEST  05-22-2012   dr Caryl Comes   Low risk adenosine no exercise nuclear study w/ no ischemia/  ef 31%,  LV moderately enlarged , global hypokinesis and severe hypokinesis of the inferior  wall  . CORONARY ANGIOPLASTY WITH STENT PLACEMENT  spring 1998   stenting  . CORONARY ARTERY BYPASS GRAFT  fall 1998   x4  . INSERTION OF SUPRAPUBIC CATHETER N/A 04/16/2018   Procedure: INSERTION OF SUPRAPUBIC CATHETER;  Surgeon: Irine Seal, MD;  Location: [facility] ORS;  Service: Urology;  Laterality: N/A;  . PACEMAKER GENERATOR CHANGE  10-30-2007  dr Samuel Bouche Jude dual chamber  . PACEMAKER INSERTION  02/ 1998   dr Caryl Comes  . skin lesions    . TRANSTHORACIC ECHOCARDIOGRAM  11/10/2017   mild LVH, ef 15-20%, diffuse hypokinesis, LVDF not evaluated/  mild AS/  severe LAE/   RVSF severely reduced/  mild MR, PR, and TR/  PASP 63mmHg        Home Medications    Prior to Admission medications   Medication Sig Start Date End Date Taking? Authorizing Provider  albuterol (PROVENTIL) (2.5 MG/3ML) 0.083% nebulizer solution Take 3 mLs (2.5 mg total) by nebulization every 6 (six) hours as needed for wheezing or shortness of breath. 02/27/18  Yes Kathie Dike, MD  atorvastatin (LIPITOR) 40 MG tablet Take 40 mg by mouth daily at 12 noon.    Yes [provider]  dabigatran (PRADAXA) 75 MG CAPS capsule Take 75 mg by mouth daily.   Yes [provider]  feeding supplement, ENSURE ENLIVE, (ENSURE ENLIVE) LIQD Take 237 mLs by mouth 2 (two) times daily between meals. Patient taking differently: Take 237 mLs by mouth daily after breakfast.  12/18/17  Yes Shah, Pratik D, DO  FEROSUL 325 (65 Fe) MG tablet Take 1 tablet by mouth daily. 03/14/18  Yes [provider]  glipiZIDE (GLUCOTROL XL) 2.5 MG 24 hr tablet Take 2.5 mg by mouth daily with breakfast.  03/12/18  Yes [provider]  levothyroxine (SYNTHROID, LEVOTHROID) 25 MCG tablet Take 25 mcg by mouth daily before breakfast.  04/13/18  Yes [provider]  Maltodextrin-Xanthan Gum ([place]) POWD Thicken fluids to nectar thick 02/27/18  Yes Memon, Jolaine Artist, MD  pantoprazole (PROTONIX) 40 MG tablet Take 40 mg by mouth daily.   Yes [provider]  polyethylene glycol (MIRALAX) packet Take 17 g by mouth daily. Patient taking differently: Take 17 g by mouth daily as needed for moderate constipation.  03/03/18  Yes Fransico Meadow, PA-C  Probiotic Product (PROBIOTIC-10 PO) Take 1 capsule by mouth daily at 12 noon.   Yes [provider]  QUEtiapine (SEROQUEL) 50 MG tablet Take 50 mg by mouth at bedtime.   Yes [provider]  rOPINIRole (REQUIP) 0.5 MG tablet Take 1 mg by mouth at bedtime.    Yes [provider]  furosemide (LASIX) 20 MG tablet  Take 1 tablet (20 mg total) by mouth every other day. 07/06/18 08/05/18  , Wenda Overland, MD    Family History Family History  Problem Relation Age of Onset  . Other Mother   . Cancer Mother        breast  . Alzheimer's disease Sister     Social History Social History   Tobacco Use  . Smoking status: Never Smoker  . Smokeless tobacco: Never Used  Substance Use Topics  . Alcohol use: No  . Drug use: No     Allergies   Cephalexin; Penicillin g; and Penicillins   Review of Systems Review of Systems  Unable to perform ROS: Dementia  Respiratory: Positive for cough.      Physical Exam Updated Vital Signs BP 126/70  Pulse 77   Temp 97.8 F (36.6 C)   Resp (!) 22   Ht 5\' 10"  (1.778 m)   Wt 69 kg   SpO2 97%   BMI 21.83 kg/m   Physical Exam Vitals signs and nursing note reviewed.  Constitutional:      General: He is not in acute distress.    Appearance: He is well-developed.  HENT:     Head: Normocephalic and atraumatic.  Eyes:     Conjunctiva/sclera: Conjunctivae normal.  Neck:     Musculoskeletal: Neck supple.  Cardiovascular:     Rate and Rhythm: Normal rate and regular rhythm.     Heart sounds: Normal heart sounds. No murmur.  Pulmonary:     Effort: Pulmonary effort is normal.     Comments: Mildly diminished BS b/l, no rales or rhonchi, breathing comfortably Abdominal:     General: Bowel sounds are normal. There is no distension.     Palpations: Abdomen is soft.     Tenderness: There is no abdominal tenderness.  Genitourinary:    Comments: Suprapubic foley in place draining clear yellow urine Musculoskeletal:        General: Swelling present.     Comments: Mild edema b/l feet  Skin:    General: Skin is warm and dry.  Neurological:     Mental Status: He is alert.     Comments: Follows basic commands, alert      ED Treatments / Results  Labs (all labs ordered are listed, but only abnormal results are displayed) Labs Reviewed  BASIC  METABOLIC PANEL - Abnormal; Notable for the following components:      Result Value   Glucose, Bld 160 (*)    BUN 31 (*)    GFR calc non Af Amer 55 (*)    All other components within normal limits  CBC - Abnormal; Notable for the following components:   RBC 4.11 (*)    Hemoglobin 11.3 (*)    HCT 36.4 (*)    RDW 17.0 (*)    All other components within normal limits  HEPATIC FUNCTION PANEL - Abnormal; Notable for the following components:   Total Protein 6.3 (*)    Albumin 3.2 (*)    All other components within normal limits  BRAIN NATRIURETIC PEPTIDE - Abnormal; Notable for the following components:   B Natriuretic Peptide 441.7 (*)    All other components within normal limits  I-STAT TROPONIN, ED  POCT I-STAT TROPONIN I    EKG EKG Interpretation  Date/Time:  Saturday July 06 2018 13:28:35 EST Ventricular Rate:  70 PR Interval:    QRS Duration: 179 QT Interval:  459 QTC Calculation: 496 R Axis:   -76 Text Interpretation:  Ventricular-paced rhythm No further analysis attempted due to paced rhythm similar to previous Confirmed by Theotis Burrow [phone]) on 07/06/2018 7:21:01 PM   Radiology Dg Chest 2 View  Result Date: 07/06/2018 CLINICAL DATA:  Swelling of the abdomen, hands and feet with nonproductive cough. EXAM: CHEST - 2 VIEW COMPARISON:  06/24/2018 FINDINGS: Stable cardiomegaly with post CABG change. Left-sided pacemaker apparatus with leads in the right atrium and right ventricle appear stable. Mild vascular congestion is noted without acute pulmonary consolidation. Trace pleural effusions. No pneumothorax. No acute nor suspicious osseous abnormalities. Degenerative changes are present about both shoulders and dorsal spine. IMPRESSION: Cardiomegaly with mild vascular congestion and trace pleural effusions likely stigmata of mild CHF. Electronically Signed   By: Ashley Royalty M.D.   On: 07/06/2018  14:21    Procedures Procedures (including critical care  time)  Medications Ordered in ED Medications  furosemide (LASIX) injection 20 mg (20 mg Intravenous Given 07/06/18 1956)     Initial Impression / Assessment and Plan / ED Course  I have reviewed the triage vital signs and the nursing notes.  Pertinent labs & imaging results that were available during my care of the patient were reviewed by me and considered in my medical decision making (see chart for details).    PT breathing comfortably on RA, O2 sat 99-100%, remainder of VS normal. CXR suggests mild CHF exacerbation. Creatinine normal. BNP 442, trop negative. No respiratory compromise on exam. I suspect his volume overload is related to discontinuing lasix 1 week ago. Gave dose of IV lasix now, instructed to give 2nd dose tomorrow. They have appt in urology clinic in 2 days for catheter exchange. Instructed to f/u with cardiologist regarding ongoing lasix dosing. Have extensively reviewed return precautions and they voiced understanding.  Final Clinical Impressions(s) / ED Diagnoses   Final diagnoses:  Acute on chronic congestive heart failure, unspecified heart failure type [facility])    ED Discharge Orders         Ordered    furosemide (LASIX) 20 MG tablet  Every other day     07/06/18 2301           , Wenda Overland, MD 07/06/18 2303

## 2018-07-06 NOTE — ED Triage Notes (Signed)
Patient's granddaughter/caretaker reports that the patient had swelling of the abdomen, hands, and feet this AM and a non product cough since yesterday. Patient has a history of CHF/pacemaker. Patient also has a history of dementia.

## 2018-07-08 DIAGNOSIS — N312 Flaccid neuropathic bladder, not elsewhere classified: Secondary | ICD-10-CM | POA: Diagnosis not present

## 2018-07-09 DIAGNOSIS — E1122 Type 2 diabetes mellitus with diabetic chronic kidney disease: Secondary | ICD-10-CM | POA: Diagnosis not present

## 2018-07-09 DIAGNOSIS — F0390 Unspecified dementia without behavioral disturbance: Secondary | ICD-10-CM | POA: Diagnosis not present

## 2018-07-09 DIAGNOSIS — I130 Hypertensive heart and chronic kidney disease with heart failure and stage 1 through stage 4 chronic kidney disease, or unspecified chronic kidney disease: Secondary | ICD-10-CM | POA: Diagnosis not present

## 2018-07-09 DIAGNOSIS — I5022 Chronic systolic (congestive) heart failure: Secondary | ICD-10-CM | POA: Diagnosis not present

## 2018-07-09 DIAGNOSIS — I2510 Atherosclerotic heart disease of native coronary artery without angina pectoris: Secondary | ICD-10-CM | POA: Diagnosis not present

## 2018-07-09 DIAGNOSIS — I255 Ischemic cardiomyopathy: Secondary | ICD-10-CM | POA: Diagnosis not present

## 2018-07-12 ENCOUNTER — Other Ambulatory Visit: Payer: Self-pay

## 2018-07-12 DIAGNOSIS — I5022 Chronic systolic (congestive) heart failure: Secondary | ICD-10-CM | POA: Diagnosis not present

## 2018-07-12 DIAGNOSIS — I255 Ischemic cardiomyopathy: Secondary | ICD-10-CM | POA: Diagnosis not present

## 2018-07-12 DIAGNOSIS — I300 Acute nonspecific idiopathic pericarditis: Secondary | ICD-10-CM | POA: Diagnosis not present

## 2018-07-12 DIAGNOSIS — F0390 Unspecified dementia without behavioral disturbance: Secondary | ICD-10-CM | POA: Diagnosis not present

## 2018-07-12 DIAGNOSIS — E1122 Type 2 diabetes mellitus with diabetic chronic kidney disease: Secondary | ICD-10-CM | POA: Diagnosis not present

## 2018-07-12 DIAGNOSIS — I2510 Atherosclerotic heart disease of native coronary artery without angina pectoris: Secondary | ICD-10-CM | POA: Diagnosis not present

## 2018-07-12 DIAGNOSIS — I130 Hypertensive heart and chronic kidney disease with heart failure and stage 1 through stage 4 chronic kidney disease, or unspecified chronic kidney disease: Secondary | ICD-10-CM | POA: Diagnosis not present

## 2018-07-12 NOTE — Patient Outreach (Signed)
[place] [facility]) Care Management  07/12/2018  KIMBERLEY DASTRUP 06/18/1929 [id number]    Successful outreach with [name] Modena Nunnery. She reported that Neil [name] continues to have mild swelling in his lower extremities but has been well since ED visit. Reported he has not complained of shortness of breath, pain or chest discomfort. He has been tolerating meals and taking medications as prescribed. Home health nurse Larene Beach) arrived during the call. Briefly spoke with [facility] regarding wheelchair scale. She will attempt to complete order via home health agency and follow up within the next few weeks. RNCM will contact MD if equipment is not available and additional orders are needed.   PLAN Will follow up within two weeks.  [place] [phone]

## 2018-07-16 DIAGNOSIS — E119 Type 2 diabetes mellitus without complications: Secondary | ICD-10-CM | POA: Diagnosis not present

## 2018-07-16 DIAGNOSIS — I5023 Acute on chronic systolic (congestive) heart failure: Secondary | ICD-10-CM | POA: Diagnosis not present

## 2018-07-16 DIAGNOSIS — I482 Chronic atrial fibrillation, unspecified: Secondary | ICD-10-CM | POA: Diagnosis not present

## 2018-07-16 DIAGNOSIS — F0390 Unspecified dementia without behavioral disturbance: Secondary | ICD-10-CM | POA: Diagnosis not present

## 2018-07-16 DIAGNOSIS — I255 Ischemic cardiomyopathy: Secondary | ICD-10-CM | POA: Diagnosis not present

## 2018-07-16 DIAGNOSIS — Z681 Body mass index (BMI) 19 or less, adult: Secondary | ICD-10-CM | POA: Diagnosis not present

## 2018-07-16 DIAGNOSIS — I130 Hypertensive heart and chronic kidney disease with heart failure and stage 1 through stage 4 chronic kidney disease, or unspecified chronic kidney disease: Secondary | ICD-10-CM | POA: Diagnosis not present

## 2018-07-16 DIAGNOSIS — I2510 Atherosclerotic heart disease of native coronary artery without angina pectoris: Secondary | ICD-10-CM | POA: Diagnosis not present

## 2018-07-16 DIAGNOSIS — I700 Atherosclerosis of aorta: Secondary | ICD-10-CM | POA: Diagnosis not present

## 2018-07-16 DIAGNOSIS — E1122 Type 2 diabetes mellitus with diabetic chronic kidney disease: Secondary | ICD-10-CM | POA: Diagnosis not present

## 2018-07-16 DIAGNOSIS — E43 Unspecified severe protein-calorie malnutrition: Secondary | ICD-10-CM | POA: Diagnosis not present

## 2018-07-16 DIAGNOSIS — I5022 Chronic systolic (congestive) heart failure: Secondary | ICD-10-CM | POA: Diagnosis not present

## 2018-07-17 DIAGNOSIS — I2510 Atherosclerotic heart disease of native coronary artery without angina pectoris: Secondary | ICD-10-CM | POA: Diagnosis not present

## 2018-07-17 DIAGNOSIS — E1122 Type 2 diabetes mellitus with diabetic chronic kidney disease: Secondary | ICD-10-CM | POA: Diagnosis not present

## 2018-07-17 DIAGNOSIS — I255 Ischemic cardiomyopathy: Secondary | ICD-10-CM | POA: Diagnosis not present

## 2018-07-17 DIAGNOSIS — F0390 Unspecified dementia without behavioral disturbance: Secondary | ICD-10-CM | POA: Diagnosis not present

## 2018-07-17 DIAGNOSIS — I5022 Chronic systolic (congestive) heart failure: Secondary | ICD-10-CM | POA: Diagnosis not present

## 2018-07-17 DIAGNOSIS — N390 Urinary tract infection, site not specified: Secondary | ICD-10-CM | POA: Diagnosis not present

## 2018-07-17 DIAGNOSIS — I130 Hypertensive heart and chronic kidney disease with heart failure and stage 1 through stage 4 chronic kidney disease, or unspecified chronic kidney disease: Secondary | ICD-10-CM | POA: Diagnosis not present

## 2018-07-18 DIAGNOSIS — N390 Urinary tract infection, site not specified: Secondary | ICD-10-CM | POA: Diagnosis not present

## 2018-07-23 DIAGNOSIS — I5022 Chronic systolic (congestive) heart failure: Secondary | ICD-10-CM | POA: Diagnosis not present

## 2018-07-23 DIAGNOSIS — I130 Hypertensive heart and chronic kidney disease with heart failure and stage 1 through stage 4 chronic kidney disease, or unspecified chronic kidney disease: Secondary | ICD-10-CM | POA: Diagnosis not present

## 2018-07-23 DIAGNOSIS — I2510 Atherosclerotic heart disease of native coronary artery without angina pectoris: Secondary | ICD-10-CM | POA: Diagnosis not present

## 2018-07-23 DIAGNOSIS — I255 Ischemic cardiomyopathy: Secondary | ICD-10-CM | POA: Diagnosis not present

## 2018-07-23 DIAGNOSIS — E1122 Type 2 diabetes mellitus with diabetic chronic kidney disease: Secondary | ICD-10-CM | POA: Diagnosis not present

## 2018-07-23 DIAGNOSIS — F0390 Unspecified dementia without behavioral disturbance: Secondary | ICD-10-CM | POA: Diagnosis not present

## 2018-07-25 ENCOUNTER — Other Ambulatory Visit: Payer: Self-pay

## 2018-07-25 DIAGNOSIS — I2510 Atherosclerotic heart disease of native coronary artery without angina pectoris: Secondary | ICD-10-CM | POA: Diagnosis not present

## 2018-07-25 DIAGNOSIS — F0390 Unspecified dementia without behavioral disturbance: Secondary | ICD-10-CM | POA: Diagnosis not present

## 2018-07-25 DIAGNOSIS — E1122 Type 2 diabetes mellitus with diabetic chronic kidney disease: Secondary | ICD-10-CM | POA: Diagnosis not present

## 2018-07-25 DIAGNOSIS — I130 Hypertensive heart and chronic kidney disease with heart failure and stage 1 through stage 4 chronic kidney disease, or unspecified chronic kidney disease: Secondary | ICD-10-CM | POA: Diagnosis not present

## 2018-07-25 DIAGNOSIS — I5022 Chronic systolic (congestive) heart failure: Secondary | ICD-10-CM | POA: Diagnosis not present

## 2018-07-25 DIAGNOSIS — I255 Ischemic cardiomyopathy: Secondary | ICD-10-CM | POA: Diagnosis not present

## 2018-07-25 NOTE — Patient Outreach (Signed)
[place] [facility]) Care Management  07/25/2018  Neil [name] 1928-08-05 [id number]  Follow-up outreach regarding wheelchair scale. Primary caregiver Modena Nunnery was not in at the time of the call. Per Mrs. Huey Bienenstock, [name] was doing well and pending follow up with Dr. Gerarda Fraction on tomorrow. Reported that they have been unable to weigh him, but he has not appeared swollen or complained of worsening symptoms since last ED visit. Reported that she would leave a message for [name] to contact RNCM when she returns.   PLAN Will follow up within a week.  [place] [phone]

## 2018-07-26 DIAGNOSIS — Z681 Body mass index (BMI) 19 or less, adult: Secondary | ICD-10-CM | POA: Diagnosis not present

## 2018-07-26 DIAGNOSIS — I5032 Chronic diastolic (congestive) heart failure: Secondary | ICD-10-CM | POA: Diagnosis not present

## 2018-07-26 DIAGNOSIS — I509 Heart failure, unspecified: Secondary | ICD-10-CM | POA: Diagnosis not present

## 2018-07-26 DIAGNOSIS — E119 Type 2 diabetes mellitus without complications: Secondary | ICD-10-CM | POA: Diagnosis not present

## 2018-07-26 DIAGNOSIS — R600 Localized edema: Secondary | ICD-10-CM | POA: Diagnosis not present

## 2018-07-26 DIAGNOSIS — I700 Atherosclerosis of aorta: Secondary | ICD-10-CM | POA: Diagnosis not present

## 2018-07-30 DIAGNOSIS — E1122 Type 2 diabetes mellitus with diabetic chronic kidney disease: Secondary | ICD-10-CM | POA: Diagnosis not present

## 2018-07-30 DIAGNOSIS — I255 Ischemic cardiomyopathy: Secondary | ICD-10-CM | POA: Diagnosis not present

## 2018-07-30 DIAGNOSIS — F0390 Unspecified dementia without behavioral disturbance: Secondary | ICD-10-CM | POA: Diagnosis not present

## 2018-07-30 DIAGNOSIS — I5022 Chronic systolic (congestive) heart failure: Secondary | ICD-10-CM | POA: Diagnosis not present

## 2018-07-30 DIAGNOSIS — I2510 Atherosclerotic heart disease of native coronary artery without angina pectoris: Secondary | ICD-10-CM | POA: Diagnosis not present

## 2018-07-30 DIAGNOSIS — I130 Hypertensive heart and chronic kidney disease with heart failure and stage 1 through stage 4 chronic kidney disease, or unspecified chronic kidney disease: Secondary | ICD-10-CM | POA: Diagnosis not present

## 2018-08-01 ENCOUNTER — Other Ambulatory Visit: Payer: Self-pay

## 2018-08-01 ENCOUNTER — Ambulatory Visit: Payer: Self-pay

## 2018-08-01 DIAGNOSIS — I130 Hypertensive heart and chronic kidney disease with heart failure and stage 1 through stage 4 chronic kidney disease, or unspecified chronic kidney disease: Secondary | ICD-10-CM | POA: Diagnosis not present

## 2018-08-01 DIAGNOSIS — I2510 Atherosclerotic heart disease of native coronary artery without angina pectoris: Secondary | ICD-10-CM | POA: Diagnosis not present

## 2018-08-01 DIAGNOSIS — I255 Ischemic cardiomyopathy: Secondary | ICD-10-CM | POA: Diagnosis not present

## 2018-08-01 DIAGNOSIS — F0390 Unspecified dementia without behavioral disturbance: Secondary | ICD-10-CM | POA: Diagnosis not present

## 2018-08-01 DIAGNOSIS — E1122 Type 2 diabetes mellitus with diabetic chronic kidney disease: Secondary | ICD-10-CM | POA: Diagnosis not present

## 2018-08-01 DIAGNOSIS — I5022 Chronic systolic (congestive) heart failure: Secondary | ICD-10-CM | POA: Diagnosis not present

## 2018-08-01 NOTE — Patient Outreach (Signed)
[place] [facility]) Care Management  08/01/2018  Neil [name] 1929-03-31 [id number]   Successful follow up with Neil [name]'s caregiver Neil [name]. Received update that wheelchair scales were not available through [place] supply or [place]. A written script was obtained from Dr. Gerarda Fraction during last visit. Neil [name] reported that Neil [name] was doing well, and family members have been monitoring him closely for s/sx of fluid retention. Neil [name] was very knowledgeable of s/sx and verbalized knowledge of symptoms that require immediate medical attention. Will continue to contact medical supply vendors and follow up within [name] weeks.   Neil [name]     Most Recent Value  Care Plan Problem [name]  High Risk for falls due to mobility and cognitive impairment  Role Documenting the Problem [name]  Care Management [place] for Problem [name]  Active  [facility] [name]   Patient will not sustain any injuries related to falls over the next 90 days.  Neil [name] Start Date  01/07/18  Neil [name] Met Date  04/17/18  Neil [name]   Over the next 30 days family members and caretakers will utilize assistive devices(wheelchair, walker) when assisting patient to ambulate inside and outside of the home.   Neil [name] Start Date  01/07/18  Neil [name] Met Date  03/19/18  Neil [name]   Over the next 30 days caregivers will ensure bed is in the lowest position and bedrails are up when patient is in bed.  Neil [name] Start Date  01/07/18  Neil [name] Met Date  03/19/18    Neil [name] CM Care Plan Problem [name]     Most Recent Value  Care Plan Problem [name]  Risk for readmission  Role Documenting the Problem [name]  Care Management Coordinator  Care Plan for Problem [name]  Active  Neil [name]  Over the next 90 days patient will not be hospitalized due to chronic disease complications.   Neil [name] Start Date  01/07/18  Neil [name]   Over the next 30 days patient will take medications as prescribed.  Neil [name] Start Date  01/07/18  Neil [name] Met Date   03/19/18  Neil [name]   Over the next 30 days caregiver will ensure member attends MD appointments as scheduled.  Neil [name] Start Date  01/07/18  [facility] CM Short Term [name] #2 Met Date  03/19/18    [facility] CM Care Plan Problem [name]     Most Recent Value  Care Plan Problem [name]  High risk for readmission.  Role Documenting the Problem [name]  Care Management [place] for Problem [name]  Active  Neil [name]   Patient will not be hospitalized over the next 45 days.  Neil [name] Start Date  06/28/18  [facility] Problem [name] Long Term [name]  Discussed s/sx of CHF complications. RNCM assisting family with obtaining a wheelchair scale to complete daily weights. [PCP agreeable to wheelchair scale to monitor weight.]  Neil [name]   Over the next 30 days caregivers will take patient to all scheduled MD appointments  Neil [name] Start Date  06/28/18  Neil [name] Met Date  08/01/18  Neil [name]   Over the next 30 days caregivers will verbalize knowledge of s/sx of CHF complications.  Neil [name] Start Date  06/28/18  [facility] CM Short Term [name] #2 Met Date  08/01/18      PLAN Will follow up within [name] weeks.   [name] [phone]

## 2018-08-04 ENCOUNTER — Emergency Department (HOSPITAL_COMMUNITY): Payer: Medicare Other

## 2018-08-04 ENCOUNTER — Encounter (HOSPITAL_COMMUNITY): Payer: Self-pay | Admitting: *Deleted

## 2018-08-04 ENCOUNTER — Other Ambulatory Visit: Payer: Self-pay

## 2018-08-04 ENCOUNTER — Emergency Department (HOSPITAL_COMMUNITY)
Admission: EM | Admit: 2018-08-04 | Discharge: 2018-08-04 | Disposition: A | Discharge status: Home / Self Care | Payer: Medicare Other | Attending: Emergency Medicine | Admitting: Emergency Medicine

## 2018-08-04 DIAGNOSIS — Z950 Presence of cardiac pacemaker: Secondary | ICD-10-CM | POA: Insufficient documentation

## 2018-08-04 DIAGNOSIS — N183 Chronic kidney disease, stage 3 (moderate): Secondary | ICD-10-CM | POA: Insufficient documentation

## 2018-08-04 DIAGNOSIS — E1122 Type 2 diabetes mellitus with diabetic chronic kidney disease: Secondary | ICD-10-CM | POA: Insufficient documentation

## 2018-08-04 DIAGNOSIS — N390 Urinary tract infection, site not specified: Secondary | ICD-10-CM | POA: Insufficient documentation

## 2018-08-04 DIAGNOSIS — Z79899 Other long term (current) drug therapy: Secondary | ICD-10-CM | POA: Insufficient documentation

## 2018-08-04 DIAGNOSIS — R4182 Altered mental status, unspecified: Secondary | ICD-10-CM | POA: Diagnosis not present

## 2018-08-04 DIAGNOSIS — I5022 Chronic systolic (congestive) heart failure: Secondary | ICD-10-CM | POA: Insufficient documentation

## 2018-08-04 DIAGNOSIS — R05 Cough: Secondary | ICD-10-CM | POA: Diagnosis not present

## 2018-08-04 DIAGNOSIS — I130 Hypertensive heart and chronic kidney disease with heart failure and stage 1 through stage 4 chronic kidney disease, or unspecified chronic kidney disease: Secondary | ICD-10-CM | POA: Diagnosis not present

## 2018-08-04 DIAGNOSIS — I2510 Atherosclerotic heart disease of native coronary artery without angina pectoris: Secondary | ICD-10-CM | POA: Diagnosis not present

## 2018-08-04 DIAGNOSIS — Z7984 Long term (current) use of oral hypoglycemic drugs: Secondary | ICD-10-CM | POA: Diagnosis not present

## 2018-08-04 DIAGNOSIS — Z955 Presence of coronary angioplasty implant and graft: Secondary | ICD-10-CM | POA: Diagnosis not present

## 2018-08-04 DIAGNOSIS — R319 Hematuria, unspecified: Secondary | ICD-10-CM | POA: Diagnosis present

## 2018-08-04 DIAGNOSIS — Z951 Presence of aortocoronary bypass graft: Secondary | ICD-10-CM | POA: Diagnosis not present

## 2018-08-04 LAB — COMPREHENSIVE METABOLIC PANEL
ALT: 10 U/L (ref 0–44)
AST: 17 U/L (ref 15–41)
Albumin: 3.4 g/dL — ABNORMAL LOW (ref 3.5–5.0)
Alkaline Phosphatase: 61 U/L (ref 38–126)
Anion gap: 10 (ref 5–15)
BUN: 53 mg/dL — ABNORMAL HIGH (ref 8–23)
CO2: 25 mmol/L (ref 22–32)
Calcium: 9 mg/dL (ref 8.9–10.3)
Chloride: 100 mmol/L (ref 98–111)
Creatinine, Ser: 1.44 mg/dL — ABNORMAL HIGH (ref 0.61–1.24)
GFR calc Af Amer: 50 mL/min — ABNORMAL LOW (ref >60)
GFR calc non Af Amer: 43 mL/min — ABNORMAL LOW (ref >60)
GLUCOSE: 91 mg/dL (ref 70–99)
POTASSIUM: 3.3 mmol/L — AB (ref 3.5–5.1)
Sodium: 135 mmol/L (ref 135–145)
Total Bilirubin: 1 mg/dL (ref 0.3–1.2)
Total Protein: 6.9 g/dL (ref 6.5–8.1)

## 2018-08-04 LAB — LACTIC ACID, PLASMA: Lactic Acid, Venous: 1.2 mmol/L (ref 0.5–1.9)

## 2018-08-04 LAB — URINALYSIS, ROUTINE W REFLEX MICROSCOPIC
Bilirubin Urine: NEGATIVE
Glucose, UA: NEGATIVE mg/dL
Ketones, ur: NEGATIVE mg/dL
Nitrite: NEGATIVE
PROTEIN: NEGATIVE mg/dL
Specific Gravity, Urine: 1.011 (ref 1.005–1.030)
WBC, UA: >50 WBC/hpf — ABNORMAL HIGH (ref 0–5)
pH: 5 (ref 5.0–8.0)

## 2018-08-04 LAB — CBC WITH DIFFERENTIAL/PLATELET
Abs Immature Granulocytes: 0.03 10*3/uL (ref 0.00–0.07)
Basophils Absolute: 0 10*3/uL (ref 0.0–0.1)
Basophils Relative: 0 %
Eosinophils Absolute: 0.2 10*3/uL (ref 0.0–0.5)
Eosinophils Relative: 3 %
HCT: 32.4 % — ABNORMAL LOW (ref 39.0–52.0)
Hemoglobin: 10.3 g/dL — ABNORMAL LOW (ref 13.0–17.0)
Immature Granulocytes: 1 %
Lymphocytes Relative: 18 %
Lymphs Abs: 1.1 10*3/uL (ref 0.7–4.0)
MCH: 27.2 pg (ref 26.0–34.0)
MCHC: 31.8 g/dL (ref 30.0–36.0)
MCV: 85.5 fL (ref 80.0–100.0)
Monocytes Absolute: 0.5 10*3/uL (ref 0.1–1.0)
Monocytes Relative: 9 %
Neutro Abs: 4.2 10*3/uL (ref 1.7–7.7)
Neutrophils Relative %: 69 %
PLATELETS: 170 10*3/uL (ref 150–400)
RBC: 3.79 MIL/uL — AB (ref 4.22–5.81)
RDW: 16.5 % — ABNORMAL HIGH (ref 11.5–15.5)
WBC: 6.1 10*3/uL (ref 4.0–10.5)
nRBC: 0 % (ref 0.0–0.2)

## 2018-08-04 LAB — INFLUENZA PANEL BY PCR (TYPE A & B)
Influenza A By PCR: NEGATIVE
Influenza B By PCR: NEGATIVE

## 2018-08-04 MED ORDER — CIPROFLOXACIN HCL 500 MG PO TABS: 500.0000 mg | ORAL_TABLET | Freq: Two times a day (BID) | ORAL | 0 refills | Status: DC

## 2018-08-04 MED ORDER — SODIUM CHLORIDE 0.9 % IV BOLUS: 500.0000 mL | Freq: Once | INTRAVENOUS | Status: DC

## 2018-08-04 MED ORDER — CIPROFLOXACIN HCL 500 MG PO TABS
500.0000 mg | ORAL_TABLET | Freq: Once | ORAL | Status: AC
  Administered 2018-08-04: 500 mg via ORAL
  Filled 2018-08-04: qty 1

## 2018-08-04 MED ORDER — FUROSEMIDE 40 MG PO TABS: 40.0000 mg | ORAL_TABLET | Freq: Every day | ORAL | 0 refills | Status: AC

## 2018-08-04 NOTE — ED Provider Notes (Signed)
[facility] DEPT Provider Note   CSN: [id number] Arrival date & time: 08/04/18  0013     History   Chief Complaint Chief Complaint  Patient presents with  . Hematuria    suprapubic catheter    HPI Neil [name] is a 85 y.o. male.  HPI  85 year old male comes in with chief complaint of hematuria. He has history of CKD, CHF, CAD and recurrent UTI status post suprapubic catheter placement.  He also has permanent A. Fib.  Level 5 caveat for severe dementia.  According to the got daughter was at bedside, patient started having some cough and URI-like symptoms 3 days ago.  Last night he started having gross hematuria therefore they decided to bring him to the ER.  There is been no nausea, vomiting, fevers, chills.  Past Medical History:  Diagnosis Date  . Chronic kidney disease (CKD), stage III (moderate) (HCC)   . Chronic systolic CHF (congestive heart failure) ([place])   . Complete heart block ([place])   . Coronary artery disease    status post [id number]  . Dementia ([place])   . Dyslipidemia   . Foley catheter in place   . Hypertension   . Ischemic cardiomyopathy    per last echo 11-10-2017  ef 15-20%  . Osteoarthritis   . Permanent atrial fibrillation   . Presence of permanent cardiac pacemaker cardiologist-  dr Samuel Bouche Jude status post generator replacement 2009 (original placement 02/ 1998)  . Renal artery stenosis (HCC)    95%  left, 50% right  . Requires assistance with activities of daily living (ADL)   . 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 Type 2 diabetes mellitus ([place])   . Urinary retention   . Uses wheelchair     Patient Active Problem List   Diagnosis Date Noted  . Systolic congestive heart failure ([place])   . Palliative care encounter   . Goals of care, counseling/discussion   . Aspiration pneumonia ([place]) 02/25/2018  . UTI (urinary tract infection) 02/25/2018  . Protein-calorie malnutrition, severe 12/18/2017  . Hypoglycemia due to  type 2 diabetes mellitus ([place]) 12/17/2017  . Confusion   . General weakness   . Dehydration 11/09/2017  . FTT (failure to thrive) in adult 11/09/2017  . AKI (acute kidney injury) ([place]) 11/09/2017  . Dementia ([place]) 11/09/2017  . Renal artery stenosis ([place])   . CKD (chronic kidney disease) stage 3, GFR 30-59 ml/min (HCC)   . Pacemaker-St. Jude 04/20/2011  . Permanent atrial fibrillation 05/03/2010  . AV BLOCK, COMPLETE 11/02/2009  . ABDOMINAL PAIN-EPIGASTRIC 10/27/2008  . AODM 07/25/2008  . Type 2 diabetes mellitus with hyperlipidemia ([place]) 07/25/2008  . Cardiomyopathy, ischemic 07/25/2008  . ATHEROSCLEROSIS OF RENAL ARTERY 07/25/2008  . HYPERTENSION, BENIGN 07/25/2008    Past Surgical History:  Procedure Laterality Date  .  suspicous skin lesions-nose and upper back-removed 04/15/18    . CARDIOVASCULAR STRESS TEST  05-22-2012   dr Caryl Comes   Low risk adenosine no exercise nuclear study w/ no ischemia/  ef 31%,  LV moderately enlarged , global hypokinesis and severe hypokinesis of the inferior wall  . CORONARY ANGIOPLASTY WITH STENT PLACEMENT  spring 1998   stenting  . CORONARY ARTERY BYPASS GRAFT  fall 1998   x4  . INSERTION OF SUPRAPUBIC CATHETER N/A 04/16/2018   Procedure: INSERTION OF SUPRAPUBIC CATHETER;  Surgeon: Irine Seal, MD;  Location: [facility] ORS;  Service: Urology;  Laterality: N/A;  . PACEMAKER GENERATOR CHANGE  10-30-2007  dr Samuel Bouche Jude dual chamber  . PACEMAKER INSERTION  02/ 1998   dr Caryl Comes  . skin lesions    . TRANSTHORACIC ECHOCARDIOGRAM  11/10/2017   mild LVH, ef 15-20%, diffuse hypokinesis, LVDF not evaluated/  mild AS/  severe LAE/  RVSF severely reduced/  mild MR, PR, and TR/  PASP 19mmHg        Home Medications    Prior to Admission medications   Medication Sig Start Date End Date Taking? Authorizing Provider  albuterol (PROVENTIL) (2.5 MG/3ML) 0.083% nebulizer solution Take 3 mLs (2.5 mg total) by nebulization every 6 (six) hours as needed for wheezing  or shortness of breath. 02/27/18  Yes Memon, Jolaine Artist, MD  ALPRAZolam (XANAX) 0.5 MG tablet Take 0.5 mg by mouth at bedtime. 06/04/18  Yes [provider]  atorvastatin (LIPITOR) 40 MG tablet Take 40 mg by mouth daily at 12 noon.    Yes [provider]  dabigatran (PRADAXA) 75 MG CAPS capsule Take 75 mg by mouth daily.   Yes [provider]  feeding supplement, ENSURE ENLIVE, (ENSURE ENLIVE) LIQD Take 237 mLs by mouth 2 (two) times daily between meals. Patient taking differently: Take 237 mLs by mouth daily after breakfast.  12/18/17  Yes Shah, Pratik D, DO  FEROSUL 325 (65 Fe) MG tablet Take 1 tablet by mouth daily. 03/14/18  Yes [provider]  glipiZIDE (GLUCOTROL XL) 2.5 MG 24 hr tablet Take 2.5 mg by mouth daily with breakfast.  03/12/18  Yes [provider]  levothyroxine (SYNTHROID, LEVOTHROID) 25 MCG tablet Take 25 mcg by mouth daily at 12 noon.  04/13/18  Yes [provider]  Maltodextrin-Xanthan Gum (RESOURCE THICKENUP CLEAR) POWD Thicken fluids to nectar thick 02/27/18  Yes Memon, Jolaine Artist, MD  pantoprazole (PROTONIX) 40 MG tablet Take 40 mg by mouth daily at 12 noon.    Yes [provider]  polyethylene glycol (MIRALAX) packet Take 17 g by mouth daily. Patient taking differently: Take 17 g by mouth daily as needed for moderate constipation.  03/03/18  Yes Fransico Meadow, PA-C  Probiotic Product (PROBIOTIC-10 PO) Take 1 capsule by mouth at bedtime.    Yes [provider]  QUEtiapine (SEROQUEL) 50 MG tablet Take 50 mg by mouth at bedtime.   Yes [provider]  rOPINIRole (REQUIP) 0.5 MG tablet Take 1 mg by mouth at bedtime.    Yes [provider]  sulfamethoxazole-trimethoprim (BACTRIM,SEPTRA) 400-80 MG tablet Take 1 tablet by mouth at bedtime. 07/18/18  Yes [provider]  ciprofloxacin (CIPRO) 500 MG tablet Take 1 tablet (500 mg total) by mouth every 12 (twelve) hours. 08/04/18   Varney Biles,  MD  furosemide (LASIX) 40 MG tablet Take 1 tablet (40 mg total) by mouth daily. 08/04/18   Varney Biles, MD    Family History Family History  Problem Relation Age of Onset  . Other Mother   . Cancer Mother        breast  . Alzheimer's disease Sister     Social History Social History   Tobacco Use  . Smoking status: Never Smoker  . Smokeless tobacco: Never Used  Substance Use Topics  . Alcohol use: No  . Drug use: No     Allergies   Cephalexin; Penicillin g; and Penicillins   Review of Systems Review of Systems  Unable to perform ROS: Mental status change     Physical Exam Updated Vital Signs BP 111/65   Pulse 78  Temp 98.2 F (36.8 C) (Oral)   Resp 14   Ht 5\' 10"  (1.778 m)   Wt 69 kg   SpO2 98%   BMI 21.83 kg/m   Physical Exam Vitals signs and nursing note reviewed.  Constitutional:      Appearance: He is well-developed.  HENT:     Head: Atraumatic.  Neck:     Musculoskeletal: Neck supple.  Cardiovascular:     Rate and Rhythm: Normal rate.  Pulmonary:     Effort: Pulmonary effort is normal. No respiratory distress.     Breath sounds: No wheezing.  Genitourinary:    Comments: Suprapubic Foley catheter in place. Foley bag has dark urine. Skin:    General: Skin is warm.  Neurological:     Mental Status: He is alert and oriented to person, place, and time.      ED Treatments / Results  Labs (all labs ordered are listed, but only abnormal results are displayed) Labs Reviewed  URINALYSIS, ROUTINE W REFLEX MICROSCOPIC - Abnormal; Notable for the following components:      Result Value   APPearance HAZY (*)    Hgb urine dipstick MODERATE (*)    Leukocytes, UA LARGE (*)    WBC, UA >50 (*)    Bacteria, UA RARE (*)    All other components within normal limits  CBC WITH DIFFERENTIAL/PLATELET - Abnormal; Notable for the following components:   RBC 3.79 (*)    Hemoglobin 10.3 (*)    HCT 32.4 (*)    RDW 16.5 (*)    All other components  within normal limits  COMPREHENSIVE METABOLIC PANEL - Abnormal; Notable for the following components:   Potassium 3.3 (*)    BUN 53 (*)    Creatinine, Ser 1.44 (*)    Albumin 3.4 (*)    GFR calc non Af Amer 43 (*)    GFR calc Af Amer 50 (*)    All other components within normal limits  URINE CULTURE  LACTIC ACID, PLASMA  INFLUENZA PANEL BY PCR (TYPE A & B)    EKG None  Radiology Dg Chest 2 View  Result Date: 08/04/2018 CLINICAL DATA:  Productive cough for 46 hours. CHF. EXAM: CHEST - 2 VIEW COMPARISON:  06/28/2018 FINDINGS: Stable cardiomegaly with tortuous atherosclerotic aorta. Patient is status post CABG. Left-sided pacemaker apparatus with leads in the right atrium and right ventricle are noted without complicating features. Mild central vascular congestion without acute pulmonary consolidation, effusion or pneumothorax. Stable mild degenerative change along the dorsal spine and both shoulders. IMPRESSION: 1. Stable cardiomegaly with mild central vascular congestion. 2. Aortic atherosclerosis. Electronically Signed   By: Ashley Royalty M.D.   On: 08/04/2018 03:19    Procedures Procedures (including critical care time)  Medications Ordered in ED Medications  ciprofloxacin (CIPRO) tablet 500 mg (500 mg Oral Given 08/04/18 0559)     Initial Impression / Assessment and Plan / ED Course  I have reviewed the triage vital signs and the nursing notes.  Pertinent labs & imaging results that were available during my care of the patient were reviewed by me and considered in my medical decision making (see chart for details).     85 year old male comes in with chief complaint of gross hematuria and cough plus URI-like symptoms.  He does not appear acutely ill at this time, but he does have a lot of comorbidities and he is elderly.  Influenza screen ordered and negative.  Chest x-ray is normal and the lung  exam did not have any focal abnormalities. He has history of CHF, at the time of  discharge the goddaughter asked [place] to see if we can give him more Lasix as she has noticed increased girth and patient usually starts having increased girth when he is retaining fluid.  X-ray does show some vascular congestion.  We will ask him to take 40 mg Lasix every day for the next 7 days.  Additionally, his urine does show infection. We will give him ciprofloxacin.  Urology has started patient on bacitracin prophylactically which he had just finished yesterday -given that information will go with Cipro. urology follow-up requested.  Final Clinical Impressions(s) / ED Diagnoses   Final diagnoses:  Lower urinary tract infectious disease    ED Discharge Orders         Ordered    ciprofloxacin (CIPRO) 500 MG tablet  Every 12 hours     08/04/18 0707    furosemide (LASIX) 40 MG tablet  Daily     08/04/18 0707           Varney Biles, MD 08/04/18 [phone]

## 2018-08-04 NOTE — ED Triage Notes (Signed)
Family member stated "I noticed blood in his catheter about 2000.  He has had the catheter x 3 wks, is scheduled to have it changed out this coming Thursday @ [facility].  Also, has a cough that started x 2 days ago, is taking the pearls."

## 2018-08-04 NOTE — Discharge Instructions (Signed)
We saw in the ER for blood in the urine and the results in the ER do show that he has infection. Please start taking ciprofloxacin as prescribed. Take furosemide 40 mg every day for the next 1 week to help relieve the fluid buildup.

## 2018-08-07 DIAGNOSIS — E1122 Type 2 diabetes mellitus with diabetic chronic kidney disease: Secondary | ICD-10-CM | POA: Diagnosis not present

## 2018-08-07 DIAGNOSIS — I2510 Atherosclerotic heart disease of native coronary artery without angina pectoris: Secondary | ICD-10-CM | POA: Diagnosis not present

## 2018-08-07 DIAGNOSIS — I130 Hypertensive heart and chronic kidney disease with heart failure and stage 1 through stage 4 chronic kidney disease, or unspecified chronic kidney disease: Secondary | ICD-10-CM | POA: Diagnosis not present

## 2018-08-07 DIAGNOSIS — I255 Ischemic cardiomyopathy: Secondary | ICD-10-CM | POA: Diagnosis not present

## 2018-08-07 DIAGNOSIS — F0390 Unspecified dementia without behavioral disturbance: Secondary | ICD-10-CM | POA: Diagnosis not present

## 2018-08-07 DIAGNOSIS — I5022 Chronic systolic (congestive) heart failure: Secondary | ICD-10-CM | POA: Diagnosis not present

## 2018-08-07 LAB — URINE CULTURE: Culture: >=100000 — AB

## 2018-08-08 ENCOUNTER — Encounter (HOSPITAL_COMMUNITY): Payer: Self-pay

## 2018-08-08 ENCOUNTER — Inpatient Hospital Stay (HOSPITAL_COMMUNITY)
Admission: EM | Admit: 2018-08-08 | Discharge: 2018-08-10 | DRG: 699 | Disposition: A | Discharge status: Home Health | Payer: Medicare Other | Attending: Internal Medicine | Admitting: Internal Medicine

## 2018-08-08 ENCOUNTER — Telehealth: Payer: Self-pay | Admitting: *Deleted

## 2018-08-08 ENCOUNTER — Emergency Department (HOSPITAL_COMMUNITY): Payer: Medicare Other

## 2018-08-08 ENCOUNTER — Other Ambulatory Visit: Payer: Self-pay

## 2018-08-08 DIAGNOSIS — I2510 Atherosclerotic heart disease of native coronary artery without angina pectoris: Secondary | ICD-10-CM | POA: Diagnosis present

## 2018-08-08 DIAGNOSIS — N281 Cyst of kidney, acquired: Secondary | ICD-10-CM | POA: Diagnosis not present

## 2018-08-08 DIAGNOSIS — Z7901 Long term (current) use of anticoagulants: Secondary | ICD-10-CM | POA: Diagnosis not present

## 2018-08-08 DIAGNOSIS — E039 Hypothyroidism, unspecified: Secondary | ICD-10-CM | POA: Diagnosis present

## 2018-08-08 DIAGNOSIS — I4821 Permanent atrial fibrillation: Secondary | ICD-10-CM | POA: Diagnosis not present

## 2018-08-08 DIAGNOSIS — R338 Other retention of urine: Secondary | ICD-10-CM | POA: Diagnosis not present

## 2018-08-08 DIAGNOSIS — I129 Hypertensive chronic kidney disease with stage 1 through stage 4 chronic kidney disease, or unspecified chronic kidney disease: Secondary | ICD-10-CM | POA: Diagnosis not present

## 2018-08-08 DIAGNOSIS — Y846 Urinary catheterization as the cause of abnormal reaction of the patient, or of later complication, without mention of misadventure at the time of the procedure: Secondary | ICD-10-CM | POA: Diagnosis present

## 2018-08-08 DIAGNOSIS — N189 Chronic kidney disease, unspecified: Secondary | ICD-10-CM

## 2018-08-08 DIAGNOSIS — F0390 Unspecified dementia without behavioral disturbance: Secondary | ICD-10-CM | POA: Diagnosis present

## 2018-08-08 DIAGNOSIS — N3001 Acute cystitis with hematuria: Secondary | ICD-10-CM

## 2018-08-08 DIAGNOSIS — I5022 Chronic systolic (congestive) heart failure: Secondary | ICD-10-CM | POA: Diagnosis present

## 2018-08-08 DIAGNOSIS — I130 Hypertensive heart and chronic kidney disease with heart failure and stage 1 through stage 4 chronic kidney disease, or unspecified chronic kidney disease: Secondary | ICD-10-CM | POA: Diagnosis not present

## 2018-08-08 DIAGNOSIS — E785 Hyperlipidemia, unspecified: Secondary | ICD-10-CM | POA: Diagnosis present

## 2018-08-08 DIAGNOSIS — Z955 Presence of coronary angioplasty implant and graft: Secondary | ICD-10-CM

## 2018-08-08 DIAGNOSIS — I10 Essential (primary) hypertension: Secondary | ICD-10-CM | POA: Diagnosis present

## 2018-08-08 DIAGNOSIS — Z79890 Hormone replacement therapy (postmenopausal): Secondary | ICD-10-CM

## 2018-08-08 DIAGNOSIS — N183 Chronic kidney disease, stage 3 unspecified: Secondary | ICD-10-CM | POA: Diagnosis present

## 2018-08-08 DIAGNOSIS — Z79899 Other long term (current) drug therapy: Secondary | ICD-10-CM

## 2018-08-08 DIAGNOSIS — Z7984 Long term (current) use of oral hypoglycemic drugs: Secondary | ICD-10-CM

## 2018-08-08 DIAGNOSIS — R310 Gross hematuria: Secondary | ICD-10-CM | POA: Diagnosis not present

## 2018-08-08 DIAGNOSIS — N390 Urinary tract infection, site not specified: Secondary | ICD-10-CM | POA: Diagnosis present

## 2018-08-08 DIAGNOSIS — T83098A Other mechanical complication of other indwelling urethral catheter, initial encounter: Principal | ICD-10-CM | POA: Diagnosis present

## 2018-08-08 DIAGNOSIS — Z950 Presence of cardiac pacemaker: Secondary | ICD-10-CM | POA: Diagnosis present

## 2018-08-08 DIAGNOSIS — E1122 Type 2 diabetes mellitus with diabetic chronic kidney disease: Secondary | ICD-10-CM | POA: Diagnosis present

## 2018-08-08 DIAGNOSIS — Z951 Presence of aortocoronary bypass graft: Secondary | ICD-10-CM

## 2018-08-08 DIAGNOSIS — E1169 Type 2 diabetes mellitus with other specified complication: Secondary | ICD-10-CM | POA: Diagnosis present

## 2018-08-08 DIAGNOSIS — I255 Ischemic cardiomyopathy: Secondary | ICD-10-CM | POA: Diagnosis present

## 2018-08-08 DIAGNOSIS — N401 Enlarged prostate with lower urinary tract symptoms: Secondary | ICD-10-CM | POA: Diagnosis present

## 2018-08-08 DIAGNOSIS — I442 Atrioventricular block, complete: Secondary | ICD-10-CM | POA: Diagnosis present

## 2018-08-08 LAB — BASIC METABOLIC PANEL
Anion gap: 7 (ref 5–15)
BUN: 38 mg/dL — ABNORMAL HIGH (ref 8–23)
CO2: 28 mmol/L (ref 22–32)
Calcium: 9 mg/dL (ref 8.9–10.3)
Chloride: 102 mmol/L (ref 98–111)
Creatinine, Ser: 1.27 mg/dL — ABNORMAL HIGH (ref 0.61–1.24)
GFR calc Af Amer: 58 mL/min — ABNORMAL LOW (ref >60)
GFR calc non Af Amer: 50 mL/min — ABNORMAL LOW (ref >60)
Glucose, Bld: 90 mg/dL (ref 70–99)
POTASSIUM: 3.6 mmol/L (ref 3.5–5.1)
Sodium: 137 mmol/L (ref 135–145)

## 2018-08-08 LAB — URINALYSIS, ROUTINE W REFLEX MICROSCOPIC

## 2018-08-08 LAB — CBC WITH DIFFERENTIAL/PLATELET
ABS IMMATURE GRANULOCYTES: 0.01 10*3/uL (ref 0.00–0.07)
BASOS PCT: 0 %
Basophils Absolute: 0 10*3/uL (ref 0.0–0.1)
Eosinophils Absolute: 0.2 10*3/uL (ref 0.0–0.5)
Eosinophils Relative: 4 %
HCT: 31.5 % — ABNORMAL LOW (ref 39.0–52.0)
Hemoglobin: 10 g/dL — ABNORMAL LOW (ref 13.0–17.0)
Immature Granulocytes: 0 %
Lymphocytes Relative: 16 %
Lymphs Abs: 1 10*3/uL (ref 0.7–4.0)
MCH: 27.5 pg (ref 26.0–34.0)
MCHC: 31.7 g/dL (ref 30.0–36.0)
MCV: 86.8 fL (ref 80.0–100.0)
MONOS PCT: 9 %
Monocytes Absolute: 0.5 10*3/uL (ref 0.1–1.0)
NEUTROS ABS: 4.3 10*3/uL (ref 1.7–7.7)
Neutrophils Relative %: 71 %
PLATELETS: 165 10*3/uL (ref 150–400)
RBC: 3.63 MIL/uL — ABNORMAL LOW (ref 4.22–5.81)
RDW: 16.4 % — ABNORMAL HIGH (ref 11.5–15.5)
WBC: 6 10*3/uL (ref 4.0–10.5)
nRBC: 0 % (ref 0.0–0.2)

## 2018-08-08 LAB — URINALYSIS, MICROSCOPIC (REFLEX)
Bacteria, UA: NONE SEEN
RBC / HPF: >50 RBC/hpf (ref 0–5)

## 2018-08-08 LAB — GLUCOSE, CAPILLARY
Glucose-Capillary: 151 mg/dL — ABNORMAL HIGH (ref 70–99)
Glucose-Capillary: 137 mg/dL — ABNORMAL HIGH (ref 70–99)

## 2018-08-08 MED ORDER — CIPROFLOXACIN HCL 500 MG PO TABS: 500.0000 mg | ORAL_TABLET | Freq: Once | ORAL | Status: DC

## 2018-08-08 MED ORDER — BACID PO TABS
2.0000 | ORAL_TABLET | Freq: Every day | ORAL | Status: DC
  Filled 2018-08-08: qty 2

## 2018-08-08 MED ORDER — BENZONATATE 100 MG PO CAPS
100.0000 mg | ORAL_CAPSULE | Freq: Three times a day (TID) | ORAL | Status: DC | PRN
  Administered 2018-08-09: 100 mg via ORAL
  Filled 2018-08-08: qty 1

## 2018-08-08 MED ORDER — SODIUM CHLORIDE 0.9 % IV SOLN
INTRAVENOUS | Status: DC
  Administered 2018-08-08 – 2018-08-09 (×2): via INTRAVENOUS

## 2018-08-08 MED ORDER — CIPROFLOXACIN IN D5W 400 MG/200ML IV SOLN
400.0000 mg | Freq: Two times a day (BID) | INTRAVENOUS | Status: DC
  Administered 2018-08-08 – 2018-08-10 (×4): 400 mg via INTRAVENOUS
  Filled 2018-08-08 (×4): qty 200

## 2018-08-08 MED ORDER — LEVOTHYROXINE SODIUM 25 MCG PO TABS
25.0000 ug | ORAL_TABLET | Freq: Every day | ORAL | Status: DC
  Administered 2018-08-09 – 2018-08-10 (×2): 25 ug via ORAL
  Filled 2018-08-08 (×2): qty 1

## 2018-08-08 MED ORDER — RESOURCE THICKENUP CLEAR PO POWD
ORAL | Status: DC | PRN
  Filled 2018-08-08 (×2): qty 125

## 2018-08-08 MED ORDER — ONDANSETRON HCL 4 MG PO TABS: 4.0000 mg | ORAL_TABLET | Freq: Four times a day (QID) | ORAL | Status: DC | PRN

## 2018-08-08 MED ORDER — ACETAMINOPHEN 650 MG RE SUPP: 650.0000 mg | Freq: Four times a day (QID) | RECTAL | Status: DC | PRN

## 2018-08-08 MED ORDER — PANTOPRAZOLE SODIUM 40 MG PO TBEC
40.0000 mg | DELAYED_RELEASE_TABLET | Freq: Every day | ORAL | Status: DC
  Administered 2018-08-09: 40 mg via ORAL
  Filled 2018-08-08: qty 1

## 2018-08-08 MED ORDER — ONDANSETRON HCL 4 MG/2ML IJ SOLN: 4.0000 mg | Freq: Four times a day (QID) | INTRAMUSCULAR | Status: DC | PRN

## 2018-08-08 MED ORDER — QUETIAPINE FUMARATE 25 MG PO TABS
50.0000 mg | ORAL_TABLET | Freq: Every day | ORAL | Status: DC
  Administered 2018-08-08 – 2018-08-09 (×2): 50 mg via ORAL
  Filled 2018-08-08 (×2): qty 2

## 2018-08-08 MED ORDER — FERROUS SULFATE 325 (65 FE) MG PO TABS
325.0000 mg | ORAL_TABLET | Freq: Every day | ORAL | Status: DC
  Administered 2018-08-08 – 2018-08-10 (×3): 325 mg via ORAL
  Filled 2018-08-08 (×4): qty 1

## 2018-08-08 MED ORDER — ROPINIROLE HCL 1 MG PO TABS
1.0000 mg | ORAL_TABLET | Freq: Every day | ORAL | Status: DC
  Administered 2018-08-08 – 2018-08-09 (×2): 1 mg via ORAL
  Filled 2018-08-08 (×2): qty 1

## 2018-08-08 MED ORDER — INSULIN ASPART 100 UNIT/ML ~~LOC~~ SOLN
0.0000 [IU] | Freq: Three times a day (TID) | SUBCUTANEOUS | Status: DC
  Administered 2018-08-08 – 2018-08-09 (×2): 2 [IU] via SUBCUTANEOUS
  Administered 2018-08-09 (×2): 1 [IU] via SUBCUTANEOUS

## 2018-08-08 MED ORDER — ACETAMINOPHEN 325 MG PO TABS: 650.0000 mg | ORAL_TABLET | Freq: Four times a day (QID) | ORAL | Status: DC | PRN

## 2018-08-08 MED ORDER — ATORVASTATIN CALCIUM 40 MG PO TABS
40.0000 mg | ORAL_TABLET | Freq: Every day | ORAL | Status: DC
  Administered 2018-08-08 – 2018-08-10 (×3): 40 mg via ORAL
  Filled 2018-08-08 (×4): qty 1

## 2018-08-08 MED ORDER — LACTINEX PO CHEW
2.0000 | CHEWABLE_TABLET | Freq: Every day | ORAL | Status: DC
  Administered 2018-08-08: 2 via ORAL
  Filled 2018-08-08 (×3): qty 2

## 2018-08-08 NOTE — Progress Notes (Signed)
Attempted to call report at [phone] placed on hold.

## 2018-08-08 NOTE — ED Triage Notes (Signed)
Patient has a suprapubic cath and today the patient woke with bloody urine in his cath bag. Patient ahs been on Cipro for a UTI.

## 2018-08-08 NOTE — ED Provider Notes (Addendum)
[facility] DEPT Provider Note   CSN: [id number] Arrival date & time: 08/08/18  0840     History   Chief Complaint Chief Complaint  Patient presents with  . Hematuria    HPI Neil [name] is a 85 y.o. male who presents with hematuria.  Past medical history significant for recurrent UTIs status post suprapubic catheter, chronic kidney disease, CHF, complete heart block status post pacemaker, coronary artery disease status post CABG, dementia.  Patient's family is at bedside.  The patient's goddaughter, grand goddaughter, wife.  They were here 4 days ago and the patient was having urine that looked like "Kool-Aid". The catheter was changed out and he was treated for infection with Cipro.  After the antibiotic was started the urine completely cleared up. The urine culture grew out enterococcus and yeast.  Last night one of the patient's family members noticed that the Foley bag was full of blood.  The patient's goddaughter says this is been a recurrent problem and the worse that she seen it.  Otherwise the patient has been doing overall well.  He has not had a fever.  He has been eating and drinking well.  He denies any complaints of pain.  He has been having some diarrhea because of the antibiotic.  He has not had any nausea or vomiting.  Family is also concerned because when they cleaned him up this morning he had blood coming out of his penis.  He is on Pradaxa for atrial fibrillation. Sees Dr. Jeffie Pollock.  HPI  Past Medical History:  Diagnosis Date  . Chronic kidney disease (CKD), stage III (moderate) (HCC)   . Chronic systolic CHF (congestive heart failure) ([place])   . Complete heart block ([place])   . Coronary artery disease    status post [id number]  . Dementia ([place])   . Dyslipidemia   . Foley catheter in place   . Hypertension   . Ischemic cardiomyopathy    per last echo 11-10-2017  ef 15-20%  . Osteoarthritis   . Permanent atrial fibrillation   .  Presence of permanent cardiac pacemaker cardiologist-  dr Samuel Bouche Jude status post generator replacement 2009 (original placement 02/ 1998)  . Renal artery stenosis (HCC)    95%  left, 50% right  . Requires assistance with activities of daily living (ADL)   . 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 Type 2 diabetes mellitus ([place])   . Urinary retention   . Uses wheelchair     Patient Active Problem List   Diagnosis Date Noted  . Systolic congestive heart failure ([place])   . Palliative care encounter   . Goals of care, counseling/discussion   . Aspiration pneumonia ([place]) 02/25/2018  . UTI (urinary tract infection) 02/25/2018  . Protein-calorie malnutrition, severe 12/18/2017  . Hypoglycemia due to type 2 diabetes mellitus ([place]) 12/17/2017  . Confusion   . General weakness   . Dehydration 11/09/2017  . FTT (failure to thrive) in adult 11/09/2017  . AKI (acute kidney injury) ([place]) 11/09/2017  . Dementia ([place]) 11/09/2017  . Renal artery stenosis ([place])   . CKD (chronic kidney disease) stage 3, GFR 30-59 ml/min (HCC)   . Pacemaker-St. Jude 04/20/2011  . Permanent atrial fibrillation 05/03/2010  . AV BLOCK, COMPLETE 11/02/2009  . ABDOMINAL PAIN-EPIGASTRIC 10/27/2008  . AODM 07/25/2008  . Type 2 diabetes mellitus with hyperlipidemia ([place]) 07/25/2008  . Cardiomyopathy, ischemic 07/25/2008  . ATHEROSCLEROSIS OF RENAL ARTERY 07/25/2008  . HYPERTENSION, BENIGN 07/25/2008  Past Surgical History:  Procedure Laterality Date  .  suspicous skin lesions-nose and upper back-removed 04/15/18    . CARDIOVASCULAR STRESS TEST  05-22-2012   dr Caryl Comes   Low risk adenosine no exercise nuclear study w/ no ischemia/  ef 31%,  LV moderately enlarged , global hypokinesis and severe hypokinesis of the inferior wall  . CORONARY ANGIOPLASTY WITH STENT PLACEMENT  spring 1998   stenting  . CORONARY ARTERY BYPASS GRAFT  fall 1998   x4  . INSERTION OF SUPRAPUBIC CATHETER N/A 04/16/2018   Procedure: INSERTION OF SUPRAPUBIC  CATHETER;  Surgeon: Irine Seal, MD;  Location: [facility] ORS;  Service: Urology;  Laterality: N/A;  . PACEMAKER GENERATOR CHANGE  10-30-2007  dr Samuel Bouche Jude dual chamber  . PACEMAKER INSERTION  02/ 1998   dr Caryl Comes  . skin lesions    . TRANSTHORACIC ECHOCARDIOGRAM  11/10/2017   mild LVH, ef 15-20%, diffuse hypokinesis, LVDF not evaluated/  mild AS/  severe LAE/  RVSF severely reduced/  mild MR, PR, and TR/  PASP 72mmHg        Home Medications    Prior to Admission medications   Medication Sig Start Date End Date Taking? Authorizing Provider  albuterol (PROVENTIL) (2.5 MG/3ML) 0.083% nebulizer solution Take 3 mLs (2.5 mg total) by nebulization every 6 (six) hours as needed for wheezing or shortness of breath. 02/27/18   Kathie Dike, MD  ALPRAZolam Duanne Moron) 0.5 MG tablet Take 0.5 mg by mouth at bedtime. 06/04/18   [provider]  atorvastatin (LIPITOR) 40 MG tablet Take 40 mg by mouth daily at 12 noon.     [provider]  ciprofloxacin (CIPRO) 500 MG tablet Take 1 tablet (500 mg total) by mouth every 12 (twelve) hours. 08/04/18   Varney Biles, MD  dabigatran (PRADAXA) 75 MG CAPS capsule Take 75 mg by mouth daily.    [provider]  feeding supplement, ENSURE ENLIVE, (ENSURE ENLIVE) LIQD Take 237 mLs by mouth 2 (two) times daily between meals. Patient taking differently: Take 237 mLs by mouth daily after breakfast.  12/18/17   Manuella Ghazi, Pratik D, DO  FEROSUL 325 (65 Fe) MG tablet Take 325 mg by mouth daily.  03/14/18   [provider]  furosemide (LASIX) 40 MG tablet Take 1 tablet (40 mg total) by mouth daily. 08/04/18   Varney Biles, MD  glipiZIDE (GLUCOTROL XL) 2.5 MG 24 hr tablet Take 2.5 mg by mouth daily with breakfast.  03/12/18   [provider]  levothyroxine (SYNTHROID, LEVOTHROID) 25 MCG tablet Take 25 mcg by mouth daily at 12 noon.  04/13/18   [provider]  Maltodextrin-Xanthan Gum (RESOURCE THICKENUP CLEAR) POWD Thicken fluids  to nectar thick 02/27/18   Kathie Dike, MD  pantoprazole (PROTONIX) 40 MG tablet Take 40 mg by mouth daily at 12 noon.     [provider]  polyethylene glycol (MIRALAX) packet Take 17 g by mouth daily. Patient taking differently: Take 17 g by mouth daily as needed for moderate constipation.  03/03/18   Fransico Meadow, PA-C  Probiotic Product (PROBIOTIC-10 PO) Take 1 capsule by mouth at bedtime.     [provider]  QUEtiapine (SEROQUEL) 50 MG tablet Take 50 mg by mouth at bedtime.    [provider]  rOPINIRole (REQUIP) 0.5 MG tablet Take 1 mg by mouth at bedtime.     [provider]  sulfamethoxazole-trimethoprim (BACTRIM,SEPTRA) 400-80 MG tablet Take 1 tablet by mouth at bedtime.  07/18/18   [provider]    Family History Family History  Problem Relation Age of Onset  . Other Mother   . Cancer Mother        breast  . Alzheimer's disease Sister     Social History Social History   Tobacco Use  . Smoking status: Never Smoker  . Smokeless tobacco: Never Used  Substance Use Topics  . Alcohol use: No  . Drug use: No     Allergies   Cephalexin and Penicillins   Review of Systems Review of Systems  Unable to perform ROS: Dementia  Constitutional: Negative for fever.  Gastrointestinal: Negative for abdominal pain and vomiting.  Genitourinary: Positive for hematuria.     Physical Exam Updated Vital Signs BP 122/67 (BP Location: Right Arm)   Pulse 68   Temp 98.4 F (36.9 C) (Oral)   Resp 14   Ht 5\' 10"  (1.778 m)   Wt 69 kg   SpO2 98%   BMI 21.83 kg/m   Physical Exam Vitals signs and nursing note reviewed.  Constitutional:      General: He is not in acute distress.    Appearance: Normal appearance. He is well-developed. He is not ill-appearing.     Comments: Elderly male in no acute distress  HENT:     Head: Normocephalic and atraumatic.  Eyes:     General: No scleral icterus.       Right eye: No discharge.          Left eye: No discharge.     Conjunctiva/sclera: Conjunctivae normal.     Pupils: Pupils are equal, round, and reactive to light.  Neck:     Musculoskeletal: Normal range of motion.  Cardiovascular:     Rate and Rhythm: Normal rate and regular rhythm.  Pulmonary:     Effort: Pulmonary effort is normal. No respiratory distress.     Breath sounds: Normal breath sounds.  Abdominal:     General: There is no distension.     Palpations: Abdomen is soft.     Tenderness: There is no abdominal tenderness.     Comments: Suprapubic catheter in place.  Foley bag has grossly bloody urine with clots  Genitourinary:    Comments: No inguinal lymphadenopathy or inguinal hernia noted. Uncircumcised penis with bloody discharge. Testicles are nontender with normal lie. Normal scrotal appearance.  Chaperone present during exam.   Skin:    General: Skin is warm and dry.  Neurological:     Mental Status: He is alert and oriented to person, place, and time.  Psychiatric:        Behavior: Behavior normal.      ED Treatments / Results  Labs (all labs ordered are listed, but only abnormal results are displayed) Labs Reviewed  BASIC METABOLIC PANEL - Abnormal; Notable for the following components:      Result Value   BUN 38 (*)    Creatinine, Ser 1.27 (*)    GFR calc non Af Amer 50 (*)    GFR calc Af Amer 58 (*)    All other components within normal limits  CBC WITH DIFFERENTIAL/PLATELET - Abnormal; Notable for the following components:   RBC 3.63 (*)    Hemoglobin 10.0 (*)    HCT 31.5 (*)    RDW 16.4 (*)    All other components within normal limits  URINALYSIS, ROUTINE W REFLEX MICROSCOPIC - Abnormal; Notable for the following components:   Color, Urine RED (*)  APPearance TURBID (*)    Glucose, UA   (*)    Value: TEST NOT REPORTED DUE TO COLOR INTERFERENCE OF URINE PIGMENT   Hgb urine dipstick   (*)    Value: TEST NOT REPORTED DUE TO COLOR INTERFERENCE OF URINE PIGMENT   Bilirubin  Urine   (*)    Value: TEST NOT REPORTED DUE TO COLOR INTERFERENCE OF URINE PIGMENT   Ketones, ur   (*)    Value: TEST NOT REPORTED DUE TO COLOR INTERFERENCE OF URINE PIGMENT   Protein, ur   (*)    Value: TEST NOT REPORTED DUE TO COLOR INTERFERENCE OF URINE PIGMENT   Nitrite   (*)    Value: TEST NOT REPORTED DUE TO COLOR INTERFERENCE OF URINE PIGMENT   Leukocytes, UA   (*)    Value: TEST NOT REPORTED DUE TO COLOR INTERFERENCE OF URINE PIGMENT   All other components within normal limits  URINE CULTURE  URINALYSIS, MICROSCOPIC (REFLEX)    EKG None  Radiology Ct Renal Stone Study  Result Date: 08/08/2018 CLINICAL DATA:  Hematuria. EXAM: CT ABDOMEN AND PELVIS WITHOUT CONTRAST TECHNIQUE: Multidetector CT imaging of the abdomen and pelvis was performed following the standard protocol without IV contrast. COMPARISON:  March 03, 2018 FINDINGS: Lower chest: The heart size is enlarged. Mild atelectasis of both lung bases are noted. Hepatobiliary: No focal liver abnormality is seen. No gallstones, gallbladder wall thickening, or biliary dilatation. Pancreas: Unremarkable. No pancreatic ductal dilatation or surrounding inflammatory changes. Spleen: Normal in size without focal abnormality. Adrenals/Urinary Tract: The bilateral adrenal glands are normal. There is a cyst in the midpole left kidney. Left renal vascular calcifications are noted. There is no hydronephrosis bilaterally. The bladder is decompressed with a suprapubic catheter in place. Stomach/Bowel: Stomach is within normal limits. Appendix appears normal. No evidence of bowel wall thickening, distention, or inflammatory changes. There is diverticulosis of colon without diverticulitis. Moderate bowel content is identified in the rectum. Vascular/Lymphatic: Aortic atherosclerosis. No enlarged abdominal or pelvic lymph nodes. Reproductive: Prostate is unremarkable. Other: None. Musculoskeletal: Degenerative joint changes of the spine are noted.  IMPRESSION: No hydronephrosis or focal discrete obstructing stone identified bilaterally. Left kidney cyst. No acute abnormality identified in the abdomen and pelvis. Electronically Signed   By: Abelardo Diesel M.D.   On: 08/08/2018 11:05    Procedures Procedures (including critical care time)  Medications Ordered in ED Medications - No data to display   Initial Impression / Assessment and Plan / ED Course  I have reviewed the triage vital signs and the nursing notes.  Pertinent labs & imaging results that were available during my care of the patient were reviewed by me and considered in my medical decision making (see chart for details).  85 year old male presents with gross hematuria. His vitals are normal. Exam is remarkable for grossly blood urine in his foley bag with clots. His abdomen is non-tender and he has no complaints. CBC is remarkable for stable hgb ~10. His kidney function is stable. UA was too bloody to analyze but does have 11-20 WBC. Shared visit with Dr. Vanita Panda. CT renal was obtained which is unremarkable. Discussed with Dr. Alyson Ingles. He recommends urine culture, abx, and admission and he will come see the patient. Discussed with pharmacy regarding antibiotics. Since culture grew enterococcus and seems to be resolving the infection, she would keep him on Cipro. Dr. Alyson Ingles would like his Pradaxa held. Spoke to Dr. Tyrell Antonio who will admit.  Final Clinical Impressions(s) /  ED Diagnoses   Final diagnoses:  Gross hematuria  Acute cystitis with hematuria  Chronic kidney disease, unspecified CKD stage    ED Discharge Orders    None       Recardo Evangelist, PA-C 08/08/18 1301    Recardo Evangelist, PA-C 08/08/18 1302    Carmin Muskrat, MD 08/08/18 [phone]

## 2018-08-08 NOTE — Progress Notes (Signed)
[facility]  Patient Status: Active (receiving services up to time of hospitalization)  [facility] is providing the following services: RN and PT  If patient discharges after hours, please call [phone].   Edwinna Areola 08/08/2018, 2:31 PM

## 2018-08-08 NOTE — H&P (Addendum)
History and Physical  Neil [name] OIZ:[id number] DOB: 06-19-1929 DOA: 08/08/2018  PCP: Redmond School, MD Patient coming from: Home  I have personally briefly reviewed patient's old medical records in [place]   Chief Complaint: Bloody Urine /   HPI: Neil [name] is a 85 y.o. male past medical history significant for chronic kidney disease a stage III, chronic systolic heart failure, complete heart block, status post pacemaker, chronic suprapubic catheter, diabetes type 2 who presents with recurrent hematuria and altered mental status.  Patient was evaluated in the emergency department few days ago for urinary tract infection and hematuria.  He was a started on oral ciprofloxacin.  Per family urine was getting clear but today patient developed hematuria.  He has been  more agitated during the day and, more restless at night and he has not been a sleeping.  Reports that he has been having loose stool.  1 day she had to clean around the supra pubic Foley catheter because it was  soak in a stool.  Evaluation in the ED; sodium 137, creatinine 1.2 hemoglobin 10 platelets 165, a white blood cell 11-20 more than 50 red blood cell.  CT renal stone study; No hydronephrosis or focal discrete obstructing stone identified Bilaterally. Left kidney cyst. Prior urine culture grew with Enterococcus faecalis..  Review of Systems: All systems reviewed and apart from history of presenting illness, are negative.  Past Medical History:  Diagnosis Date  . Chronic kidney disease (CKD), stage III (moderate) (HCC)   . Chronic systolic CHF (congestive heart failure) ([place])   . Complete heart block ([place])   . Coronary artery disease    status post [id number]  . Dementia ([place])   . Dyslipidemia   . Foley catheter in place   . Hypertension   . Ischemic cardiomyopathy    per last echo 11-10-2017  ef 15-20%  . Osteoarthritis   . Permanent atrial fibrillation   . Presence of permanent cardiac  pacemaker cardiologist-  dr Samuel Bouche Jude status post generator replacement 2009 (original placement 02/ 1998)  . Renal artery stenosis (HCC)    95%  left, 50% right  . Requires assistance with activities of daily living (ADL)   . 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 Type 2 diabetes mellitus ([place])   . Urinary retention   . Uses wheelchair    Past Surgical History:  Procedure Laterality Date  .  suspicous skin lesions-nose and upper back-removed 04/15/18    . CARDIOVASCULAR STRESS TEST  05-22-2012   dr Caryl Comes   Low risk adenosine no exercise nuclear study w/ no ischemia/  ef 31%,  LV moderately enlarged , global hypokinesis and severe hypokinesis of the inferior wall  . CORONARY ANGIOPLASTY WITH STENT PLACEMENT  spring 1998   stenting  . CORONARY ARTERY BYPASS GRAFT  fall 1998   x4  . INSERTION OF SUPRAPUBIC CATHETER N/A 04/16/2018   Procedure: INSERTION OF SUPRAPUBIC CATHETER;  Surgeon: Irine Seal, MD;  Location: [facility];  Service: Urology;  Laterality: N/A;  . PACEMAKER GENERATOR CHANGE  10-30-2007  dr Samuel Bouche Jude dual chamber  . PACEMAKER INSERTION  02/ 1998   dr Caryl Comes  . skin lesions    . TRANSTHORACIC ECHOCARDIOGRAM  11/10/2017   mild LVH, ef 15-20%, diffuse hypokinesis, LVDF not evaluated/  mild AS/  severe LAE/  RVSF severely reduced/  mild MR, PR, and TR/  PASP 28mmHg   Social History:  reports  that he has never smoked. He has never used smokeless tobacco. He reports that he does not drink alcohol or use drugs.   Allergies  Allergen Reactions  . Cephalexin Other (See Comments)    hallucinations Other reaction(s): Delusions (intolerance)  . Penicillins Swelling and Rash    Itching Has patient had a PCN reaction causing immediate rash, facial/tongue/throat swelling, SOB or lightheadedness with hypotension: Yes Has patient had a PCN reaction causing severe rash involving mucus membranes or skin necrosis: Yes Has patient had a PCN reaction that required hospitalization: No Has  patient had a PCN reaction occurring within the last 10 years: No If all of the above answers are "NO", then may proceed with Cephalosporin use.    Family History  Problem Relation Age of Onset  . Other Mother   . Cancer Mother        breast  . Alzheimer's disease Sister      Prior to Admission medications   Medication Sig Start Date End Date Taking? Authorizing Provider  atorvastatin (LIPITOR) 40 MG tablet Take 40 mg by mouth daily at 12 noon.    Yes [provider]  benzonatate (TESSALON) 100 MG capsule Take 100 mg by mouth 3 (three) times daily as needed for cough.   Yes [provider]  ciprofloxacin (CIPRO) 500 MG tablet Take 1 tablet (500 mg total) by mouth every 12 (twelve) hours. 08/04/18  Yes Varney Biles, MD  dabigatran (PRADAXA) 75 MG CAPS capsule Take 75 mg by mouth daily.   Yes [provider]  feeding supplement, ENSURE ENLIVE, (ENSURE ENLIVE) LIQD Take 237 mLs by mouth 2 (two) times daily between meals. Patient taking differently: Take 237 mLs by mouth daily after breakfast.  12/18/17  Yes Shah, Pratik D, DO  FEROSUL 325 (65 Fe) MG tablet Take 325 mg by mouth daily.  03/14/18  Yes [provider]  furosemide (LASIX) 40 MG tablet Take 1 tablet (40 mg total) by mouth daily. 08/04/18  Yes Nanavati, Ankit, MD  glipiZIDE (GLUCOTROL XL) 2.5 MG 24 hr tablet Take 2.5-5 mg by mouth as directed. Depends on blood sugar. Checks twice daily. 03/12/18  Yes [provider]  levothyroxine (SYNTHROID, LEVOTHROID) 25 MCG tablet Take 25 mcg by mouth daily at 12 noon.  04/13/18  Yes [provider]  Maltodextrin-Xanthan Gum (RESOURCE THICKENUP CLEAR) POWD Thicken fluids to nectar thick 02/27/18  Yes Memon, Jolaine Artist, MD  pantoprazole (PROTONIX) 40 MG tablet Take 40 mg by mouth daily at 12 noon.    Yes [provider]  polyethylene glycol (MIRALAX) packet Take 17 g by mouth daily. Patient taking differently: Take 17 g by mouth daily as  needed for moderate constipation.  03/03/18  Yes Fransico Meadow, PA-C  Probiotic Product (PROBIOTIC-10 PO) Take 1 capsule by mouth at bedtime.    Yes [provider]  QUEtiapine (SEROQUEL) 50 MG tablet Take 50 mg by mouth at bedtime.   Yes [provider]  rOPINIRole (REQUIP) 0.5 MG tablet Take 1 mg by mouth at bedtime.    Yes [provider]  albuterol (PROVENTIL) (2.5 MG/3ML) 0.083% nebulizer solution Take 3 mLs (2.5 mg total) by nebulization every 6 (six) hours as needed for wheezing or shortness of breath. Patient not taking: Reported on 08/08/2018 02/27/18   Kathie Dike, MD   Physical Exam: Vitals:   08/08/18 1104 08/08/18 1200 08/08/18 1300 08/08/18 1400  BP:  126/64 127/73 122/70  Pulse: 75 79 81 78  Resp:  15   Temp:      TempSrc:      SpO2: 100% 100% 99% 99%  Weight:      Height:         General exam: Moderately built and nourished patient, lying comfortably supine on the gurney in no obvious distress.  Head, eyes and ENT: Nontraumatic and normocephalic. Pupils equally reacting to light and accommodation. Oral mucosa moist.  Neck: Supple. No JVD, carotid bruit or thyromegaly.  Lymphatics: No lymphadenopathy.  Respiratory system: Clear to auscultation. No increased work of breathing.  Cardiovascular system: S1 and S2 heard, RRR. No JVD, murmurs, gallops, clicks or pedal edema.  Gastrointestinal system: Abdomen is nondistended, soft and nontender. Normal bowel sounds heard. No organomegaly or masses appreciated.  Central nervous system: Alert and oriented. No focal neurological deficits.  Extremities: Symmetric 5 x 5 power. Peripheral pulses symmetrically felt.   Skin: No rashes or acute findings.  Musculoskeletal system: Negative exam.  Psychiatry: Pleasant and cooperative.   Labs on Admission:  Basic Metabolic Panel: Recent Labs  Lab 08/04/18 0358 08/08/18 1040  NA 135 137  K 3.3* 3.6  CL 100 102  CO2 25 28  GLUCOSE 91 90    BUN 53* 38*  CREATININE 1.44* 1.27*  CALCIUM 9.0 9.0   Liver Function Tests: Recent Labs  Lab 08/04/18 0358  AST 17  ALT 10  ALKPHOS 61  BILITOT 1.0  PROT 6.9  ALBUMIN 3.4*   No results for input(s): LIPASE, AMYLASE in the last 168 hours. No results for input(s): AMMONIA in the last 168 hours. CBC: Recent Labs  Lab 08/04/18 0358 08/08/18 1040  WBC 6.1 6.0  NEUTROABS 4.2 4.3  HGB 10.3* 10.0*  HCT 32.4* 31.5*  MCV 85.5 86.8  PLT 170 165   Cardiac Enzymes: No results for input(s): CKTOTAL, CKMB, CKMBINDEX, TROPONINI in the last 168 hours.  BNP (last 3 results) No results for input(s): PROBNP in the last 8760 hours. CBG: No results for input(s): GLUCAP in the last 168 hours.  Radiological Exams on Admission: Ct Renal Stone Study  Result Date: 08/08/2018 CLINICAL DATA:  Hematuria. EXAM: CT ABDOMEN AND PELVIS WITHOUT CONTRAST TECHNIQUE: Multidetector CT imaging of the abdomen and pelvis was performed following the standard protocol without IV contrast. COMPARISON:  March 03, 2018 FINDINGS: Lower chest: The heart size is enlarged. Mild atelectasis of both lung bases are noted. Hepatobiliary: No focal liver abnormality is seen. No gallstones, gallbladder wall thickening, or biliary dilatation. Pancreas: Unremarkable. No pancreatic ductal dilatation or surrounding inflammatory changes. Spleen: Normal in size without focal abnormality. Adrenals/Urinary Tract: The bilateral adrenal glands are normal. There is a cyst in the midpole left kidney. Left renal vascular calcifications are noted. There is no hydronephrosis bilaterally. The bladder is decompressed with a suprapubic catheter in place. Stomach/Bowel: Stomach is within normal limits. Appendix appears normal. No evidence of bowel wall thickening, distention, or inflammatory changes. There is diverticulosis of colon without diverticulitis. Moderate bowel content is identified in the rectum. Vascular/Lymphatic: Aortic  atherosclerosis. No enlarged abdominal or pelvic lymph nodes. Reproductive: Prostate is unremarkable. Other: None. Musculoskeletal: Degenerative joint changes of the spine are noted. IMPRESSION: No hydronephrosis or focal discrete obstructing stone identified bilaterally. Left kidney cyst. No acute abnormality identified in the abdomen and pelvis. Electronically Signed   By: Abelardo Diesel M.D.   On: 08/08/2018 11:05      Assessment/Plan Active Problems:   Type 2 diabetes mellitus with hyperlipidemia (HCC)   Cardiomyopathy, ischemic  AV BLOCK, COMPLETE   Permanent atrial fibrillation   HYPERTENSION, BENIGN   Pacemaker-St. Jude   CKD (chronic kidney disease) stage 3, GFR 30-59 ml/min (HCC)   UTI (urinary tract infection)  1-UTI secondary to suprapubic catheter, hematuria: Patient develop recurrent hematuria.  Will hold Pradaxa. He has multiple intolerance and allergy to antibiotics.  For this reason we will continue with ciprofloxacin, IV. Follow urine culture. Urology was consulted. Continue with IV fluids..   2-hypertension; Hold Lasix this.  3-diabetes; Hold glipizide while inpatient. Sliding scale insulin  4-history of A. fib, AV block, status post pacemaker:; Hold Pradaxa due to hematuria.  5-dementia; Continue with Seroquel Requip,  6-hypothyroidism; Continue with Synthroid.  7-GI; Loose stool; monitor. Continue with florastore.   DVT Prophylaxis: SCDs Code Status: Partial code, no intubation Family Communication: Wife and daughter at bedside. Disposition Plan; admit under observation for treatment of UTI and management of hematuria.  Time spent: 75 minutes   Jayliana Valencia A Chloie Loney MD [facility]   If 7PM-7AM, please contact night-coverage [email or web address] Password [email or web address]  08/08/2018, 2:23 PM

## 2018-08-08 NOTE — ED Notes (Signed)
ED TO INPATIENT HANDOFF REPORT  Name/Age/Gender Neil [name] 85 y.o. male  Code Status    Code Status Orders  (From admission, onward)         Start     Ordered   08/08/18 1335  Limited resuscitation (code)  Continuous    Question Answer Comment  In the event of cardiac or respiratory ARREST: Initiate Code Blue, Call Rapid Response Yes   In the event of cardiac or respiratory ARREST: Perform CPR Yes   In the event of cardiac or respiratory ARREST: Perform Intubation/Mechanical Ventilation No   In the event of cardiac or respiratory ARREST: Use NIPPV/BiPAp only if indicated Yes   In the event of cardiac or respiratory ARREST: Administer ACLS medications if indicated Yes   In the event of cardiac or respiratory ARREST: Perform Defibrillation or Cardioversion if indicated Yes      08/08/18 1334        Code Status History    Date Active Date Inactive Code Status Order ID Comments User Context   05/19/2018 2216 05/22/2018 2041 Full Code [id number]  Colbert Ewing, MD ED   02/26/2018 0000 02/27/2018 2151 Full Code [id number]  Rodena Goldmann, DO Inpatient   12/17/2017 1619 12/18/2017 1918 Full Code [id number]  Phillips Grout, MD ED   11/09/2017 1731 11/11/2017 1733 Full Code [id number]  Phillips Grout, MD ED    Advance Directive Documentation     Most Recent Value  Type of Advance Directive  Healthcare Power of Attorney  Pre-existing out of facility DNR order (yellow form or pink MOST form)  -  "MOST" Form in Place?  -      Home/SNF/Other Home  Chief Complaint blood in urine  Level of Care/Admitting Diagnosis ED Disposition    ED Disposition Condition [place]: [facility] [100102]  Level of Care: Med-Surg [16]  Diagnosis: UTI (urinary tract infection) [676720]  Admitting Physician: Elmarie Shiley [phone]  Attending Physician: Niel Hummer A [[id number]]  PT Class (Do Not Modify): Observation [104]  PT Acc Code (Do Not Modify):  Observation [10022]       Medical History Past Medical History:  Diagnosis Date  . Chronic kidney disease (CKD), stage III (moderate) (HCC)   . Chronic systolic CHF (congestive heart failure) ([place])   . Complete heart block ([place])   . Coronary artery disease    status post [id number]  . Dementia ([place])   . Dyslipidemia   . Foley catheter in place   . Hypertension   . Ischemic cardiomyopathy    per last echo 11-10-2017  ef 15-20%  . Osteoarthritis   . Permanent atrial fibrillation   . Presence of permanent cardiac pacemaker cardiologist-  dr Samuel Bouche Jude status post generator replacement 2009 (original placement 02/ 1998)  . Renal artery stenosis (HCC)    95%  left, 50% right  . Requires assistance with activities of daily living (ADL)   . 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 Type 2 diabetes mellitus ([place])   . Urinary retention   . Uses wheelchair     Allergies Allergies  Allergen Reactions  . Cephalexin Other (See Comments)    hallucinations Other reaction(s): Delusions (intolerance)  . Penicillins Swelling and Rash    Itching Has patient had a PCN reaction causing immediate rash, facial/tongue/throat swelling, SOB or lightheadedness with hypotension: Yes Has patient had a PCN reaction causing severe rash involving mucus membranes or  skin necrosis: Yes Has patient had a PCN reaction that required hospitalization: No Has patient had a PCN reaction occurring within the last 10 years: No If all of the above answers are "NO", then may proceed with Cephalosporin use.    IV Location/Drains/Wounds Patient Lines/Drains/Airways Status   Active Line/Drains/Airways    Name:   Placement date:   Placement time:   Site:   Days:   Peripheral IV 08/08/18 Left Antecubital   08/08/18    1333    Antecubital   less than 1   Suprapubic Catheter   -    -    -             Labs/Imaging Results for orders placed or performed during the hospital encounter of 08/08/18 (from the past 48 hour(s))   Basic metabolic panel     Status: Abnormal   Collection Time: 08/08/18 10:40 AM  Result Value Ref Range   Sodium 137 135 - 145 mmol/L   Potassium 3.6 3.5 - 5.1 mmol/L   Chloride 102 98 - 111 mmol/L   CO2 28 22 - 32 mmol/L   Glucose, Bld 90 70 - 99 mg/dL   BUN 38 (H) 8 - 23 mg/dL   Creatinine, Ser 1.27 (H) 0.61 - 1.24 mg/dL   Calcium 9.0 8.9 - 10.3 mg/dL   GFR calc non Af Amer 50 (L) >60 mL/min   GFR calc Af Amer 58 (L) >60 mL/min   Anion gap 7 5 - 15    Comment: Performed at [facility], [place] [address], [place], [place] [address]  CBC with Differential     Status: Abnormal   Collection Time: 08/08/18 10:40 AM  Result Value Ref Range   WBC 6.0 4.0 - 10.5 K/uL   RBC 3.63 (L) 4.22 - 5.81 MIL/uL   Hemoglobin 10.0 (L) 13.0 - 17.0 g/dL   HCT 31.5 (L) 39.0 - 52.0 %   MCV 86.8 80.0 - 100.0 fL   MCH 27.5 26.0 - 34.0 pg   MCHC 31.7 30.0 - 36.0 g/dL   RDW 16.4 (H) 11.5 - 15.5 %   Platelets 165 150 - 400 K/uL   nRBC 0.0 0.0 - 0.2 %   Neutrophils Relative % 71 %   Neutro Abs 4.3 1.7 - 7.7 K/uL   Lymphocytes Relative 16 %   Lymphs Abs 1.0 0.7 - 4.0 K/uL   Monocytes Relative 9 %   Monocytes Absolute 0.5 0.1 - 1.0 K/uL   Eosinophils Relative 4 %   Eosinophils Absolute 0.2 0.0 - 0.5 K/uL   Basophils Relative 0 %   Basophils Absolute 0.0 0.0 - 0.1 K/uL   Immature Granulocytes 0 %   Abs Immature Granulocytes 0.01 0.00 - 0.07 K/uL    Comment: Performed at [facility], [place] [address], [place], [place] [address]  Urinalysis, Routine w reflex microscopic     Status: Abnormal   Collection Time: 08/08/18 11:10 AM  Result Value Ref Range   Color, Urine RED (A) YELLOW    Comment: BIOCHEMICALS MAY BE AFFECTED BY COLOR   APPearance TURBID (A) CLEAR   Specific Gravity, Urine  1.005 - 1.030    TEST NOT REPORTED DUE TO COLOR INTERFERENCE OF URINE PIGMENT   pH  5.0 - 8.0    TEST NOT REPORTED DUE TO COLOR INTERFERENCE OF URINE PIGMENT   Glucose, UA (A)  NEGATIVE mg/dL    TEST NOT REPORTED DUE TO COLOR INTERFERENCE OF URINE PIGMENT  Hgb urine dipstick (A) NEGATIVE    TEST NOT REPORTED DUE TO COLOR INTERFERENCE OF URINE PIGMENT   Bilirubin Urine (A) NEGATIVE    TEST NOT REPORTED DUE TO COLOR INTERFERENCE OF URINE PIGMENT   Ketones, ur (A) NEGATIVE mg/dL    TEST NOT REPORTED DUE TO COLOR INTERFERENCE OF URINE PIGMENT   Protein, ur (A) NEGATIVE mg/dL    TEST NOT REPORTED DUE TO COLOR INTERFERENCE OF URINE PIGMENT   Nitrite (A) NEGATIVE    TEST NOT REPORTED DUE TO COLOR INTERFERENCE OF URINE PIGMENT   Leukocytes, UA (A) NEGATIVE    TEST NOT REPORTED DUE TO COLOR INTERFERENCE OF URINE PIGMENT    Comment: Performed at [facility], [place] [address], [place], [place] [address]  Urinalysis, Microscopic (reflex)     Status: None   Collection Time: 08/08/18 11:10 AM  Result Value Ref Range   RBC / HPF >50 0 - 5 RBC/hpf   WBC, UA 11-20 0 - 5 WBC/hpf   Bacteria, UA NONE SEEN NONE SEEN   Squamous Epithelial / LPF 0-5 0 - 5   Budding Yeast PRESENT     Comment: Performed at [facility], [place] [address], [place],  [id number]   Ct Renal Stone Study  Result Date: 08/08/2018 CLINICAL DATA:  Hematuria. EXAM: CT ABDOMEN AND PELVIS WITHOUT CONTRAST TECHNIQUE: Multidetector CT imaging of the abdomen and pelvis was performed following the standard protocol without IV contrast. COMPARISON:  March 03, 2018 FINDINGS: Lower chest: The heart size is enlarged. Mild atelectasis of both lung bases are noted. Hepatobiliary: No focal liver abnormality is seen. No gallstones, gallbladder wall thickening, or biliary dilatation. Pancreas: Unremarkable. No pancreatic ductal dilatation or surrounding inflammatory changes. Spleen: Normal in size without focal abnormality. Adrenals/Urinary Tract: The bilateral adrenal glands are normal. There is a cyst in the midpole left kidney. Left renal vascular calcifications are noted. There is no  hydronephrosis bilaterally. The bladder is decompressed with a suprapubic catheter in place. Stomach/Bowel: Stomach is within normal limits. Appendix appears normal. No evidence of bowel wall thickening, distention, or inflammatory changes. There is diverticulosis of colon without diverticulitis. Moderate bowel content is identified in the rectum. Vascular/Lymphatic: Aortic atherosclerosis. No enlarged abdominal or pelvic lymph nodes. Reproductive: Prostate is unremarkable. Other: None. Musculoskeletal: Degenerative joint changes of the spine are noted. IMPRESSION: No hydronephrosis or focal discrete obstructing stone identified bilaterally. Left kidney cyst. No acute abnormality identified in the abdomen and pelvis. Electronically Signed   By: Abelardo Diesel M.D.   On: 08/08/2018 11:05   None  Pending Labs Unresulted Labs (From admission, onward)    Start     Ordered   08/09/18 7846  Basic metabolic panel  Tomorrow morning,   R     08/08/18 1334   08/09/18 0500  CBC  Tomorrow morning,   R     08/08/18 1334   08/08/18 1212  Urine culture  Add-on,   STAT     08/08/18 1212          Vitals/Pain Today's Vitals   08/08/18 1430 08/08/18 1500 08/08/18 1530 08/08/18 1541  BP: 130/74 123/84 (!) 122/98 (!) 122/98  Pulse: 80 76  82  Resp:    14  Temp:      TempSrc:      SpO2: 100% 100%  98%  Weight:      Height:      PainSc:        Isolation Precautions No active  isolations  Medications Medications  ciprofloxacin (CIPRO) IVPB 400 mg (400 mg Intravenous New Bag/Given 08/08/18 1405)  0.9 %  sodium chloride infusion ( Intravenous New Bag/Given 08/08/18 1407)  atorvastatin (LIPITOR) tablet 40 mg (has no administration in time range)  QUEtiapine (SEROQUEL) tablet 50 mg (has no administration in time range)  levothyroxine (SYNTHROID, LEVOTHROID) tablet 25 mcg (has no administration in time range)  pantoprazole (PROTONIX) EC tablet 40 mg (has no administration in time range)  lactobacillus  acidophilus (BACID) tablet 2 tablet (has no administration in time range)  ferrous sulfate tablet 325 mg (has no administration in time range)  RESOURCE THICKENUP CLEAR (has no administration in time range)  rOPINIRole (REQUIP) tablet 1 mg (has no administration in time range)  benzonatate (TESSALON) capsule 100 mg (has no administration in time range)  acetaminophen (TYLENOL) tablet 650 mg (has no administration in time range)    Or  acetaminophen (TYLENOL) suppository 650 mg (has no administration in time range)  ondansetron (ZOFRAN) tablet 4 mg (has no administration in time range)    Or  ondansetron (ZOFRAN) injection 4 mg (has no administration in time range)  insulin aspart (novoLOG) injection 0-9 Units (has no administration in time range)    Mobility non-ambulatory

## 2018-08-08 NOTE — Telephone Encounter (Signed)
Post ED Visit - Positive Culture Follow-up  Culture report reviewed by antimicrobial stewardship pharmacist:  []  Elenor Quinones, Pharm.D. []  Heide Guile, Pharm.D., BCPS AQ-ID []  Parks Neptune, Pharm.D., BCPS []  Alycia Rossetti, Pharm.D., BCPS []  [place], Pharm.D., BCPS, AAHIVP []  Legrand Como, Pharm.D., BCPS, AAHIVP [x]  Salome Arnt, PharmD, BCPS []  Johnnette Gourd, PharmD, BCPS []  Hughes Better, PharmD, BCPS []  Leeroy Cha, PharmD  Positive urine culture Treated with Ciprofloxacin, organism sensitive to the same and no further patient follow-up is required at this time.  Harlon Flor Talley 08/08/2018, 11:02 AM

## 2018-08-08 NOTE — Consult Note (Signed)
Urology Consult  Referring physician: Dr. Vanita Panda Reason for referral: gross heamturia  Chief Complaint: Gross hematuria  History of Present Illness: Neil [name] is a 85yo with a history of CKD3, CAD, dementia, and BPH with urinary retention managed with chronic SP tube who presented to the ER with a 4 day history of intermittent gross hematuria. 4 days ago he also presented to the ER with gross hematuria and was placed on bactrim for a UTI. The hematuria improved but this morning the hematuria worsened and his urine was dark red. He denies any pelvic pain. He urinary urgency or dysuria. He was having urinary incontinence per urethra. The SP tube is draining well. NO other associated symptoms. No exacerbating/alleviaitng events. He underwent CT which showed no hydronephrosis and a decompressed bladder around the SP tube. He did have a history of recurrent UTIs due to incomplete bladder emptying.   Past Medical History:  Diagnosis Date  . Chronic kidney disease (CKD), stage III (moderate) (HCC)   . Chronic systolic CHF (congestive heart failure) ([place])   . Complete heart block ([place])   . Coronary artery disease    status post [id number]  . Dementia ([place])   . Dyslipidemia   . Foley catheter in place   . Hypertension   . Ischemic cardiomyopathy    per last echo 11-10-2017  ef 15-20%  . Osteoarthritis   . Permanent atrial fibrillation   . Presence of permanent cardiac pacemaker cardiologist-  dr Samuel Bouche Jude status post generator replacement 2009 (original placement 02/ 1998)  . Renal artery stenosis (HCC)    95%  left, 50% right  . Requires assistance with activities of daily living (ADL)   . 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 Type 2 diabetes mellitus ([place])   . Urinary retention   . Uses wheelchair    Past Surgical History:  Procedure Laterality Date  .  suspicous skin lesions-nose and upper back-removed 04/15/18    . CARDIOVASCULAR STRESS TEST  05-22-2012   dr Caryl Comes   Low risk adenosine no  exercise nuclear study w/ no ischemia/  ef 31%,  LV moderately enlarged , global hypokinesis and severe hypokinesis of the inferior wall  . CORONARY ANGIOPLASTY WITH STENT PLACEMENT  spring 1998   stenting  . CORONARY ARTERY BYPASS GRAFT  fall 1998   x4  . INSERTION OF SUPRAPUBIC CATHETER N/A 04/16/2018   Procedure: INSERTION OF SUPRAPUBIC CATHETER;  Surgeon: Irine Seal, MD;  Location: [facility];  Service: Urology;  Laterality: N/A;  . PACEMAKER GENERATOR CHANGE  10-30-2007  dr Samuel Bouche Jude dual chamber  . PACEMAKER INSERTION  02/ 1998   dr Caryl Comes  . skin lesions    . TRANSTHORACIC ECHOCARDIOGRAM  11/10/2017   mild LVH, ef 15-20%, diffuse hypokinesis, LVDF not evaluated/  mild AS/  severe LAE/  RVSF severely reduced/  mild Neil, PR, and TR/  PASP 69mmHg    Medications: I have reviewed the patient's current medications. Allergies:  Allergies  Allergen Reactions  . Cephalexin Other (See Comments)    hallucinations Other reaction(s): Delusions (intolerance)  . Penicillins Swelling and Rash    Itching Has patient had a PCN reaction causing immediate rash, facial/tongue/throat swelling, SOB or lightheadedness with hypotension: Yes Has patient had a PCN reaction causing severe rash involving mucus membranes or skin necrosis: Yes Has patient had a PCN reaction that required hospitalization: No Has patient had a PCN reaction occurring within the last 10 years: No If  all of the above answers are "NO", then may proceed with Cephalosporin use.    Family History  Problem Relation Age of Onset  . Other Mother   . Cancer Mother        breast  . Alzheimer's disease Sister    Social History:  reports that he has never smoked. He has never used smokeless tobacco. He reports that he does not drink alcohol or use drugs.  Review of Systems  Constitutional: Positive for malaise/fatigue.  Genitourinary: Positive for hematuria.  All other systems reviewed and are negative.   Physical Exam:   Vital signs in last 24 hours: Temp:  [98.4 F (36.9 C)-98.8 F (37.1 C)] 98.8 F (37.1 C) (01/30 1630) Pulse Rate:  [68-98] 77 (01/30 1630) Resp:  [14-16] 16 (01/30 1630) BP: (121-130)/(64-98) 129/75 (01/30 1630) SpO2:  [97 %-100 %] 99 % (01/30 1630) Weight:  [38 kg] 69 kg (01/30 0858) Physical Exam  Constitutional: He is oriented to person, place, and time. He appears well-developed and well-nourished.  HENT:  Head: Normocephalic and atraumatic.  Eyes: Pupils are equal, round, and reactive to light. EOM are normal.  Neck: Normal range of motion. No thyromegaly present.  Cardiovascular: Normal rate and regular rhythm.  Respiratory: Effort normal. No respiratory distress.  GI: Soft. He exhibits no distension. There is no abdominal tenderness. Hernia confirmed negative in the right inguinal area and confirmed negative in the left inguinal area.  Genitourinary:    Testes and penis normal.  Circumcised.  Musculoskeletal: Normal range of motion.        General: No edema.  Lymphadenopathy:       Right: No inguinal adenopathy present.       Left: No inguinal adenopathy present.  Neurological: He is alert and oriented to person, place, and time.  Skin: Skin is warm and dry.  Psychiatric: He has a normal mood and affect. His behavior is normal. Judgment and thought content normal.    Laboratory Data:  Results for orders placed or performed during the hospital encounter of 08/08/18 (from the past 72 hour(s))  Basic metabolic panel     Status: Abnormal   Collection Time: 08/08/18 10:40 AM  Result Value Ref Range   Sodium 137 135 - 145 mmol/L   Potassium 3.6 3.5 - 5.1 mmol/L   Chloride 102 98 - 111 mmol/L   CO2 28 22 - 32 mmol/L   Glucose, Bld 90 70 - 99 mg/dL   BUN 38 (H) 8 - 23 mg/dL   Creatinine, Ser 1.27 (H) 0.61 - 1.24 mg/dL   Calcium 9.0 8.9 - 10.3 mg/dL   GFR calc non Af Amer 50 (L) >60 mL/min   GFR calc Af Amer 58 (L) >60 mL/min   Anion gap 7 5 - 15    Comment: Performed  at [facility], [place] [address], [place], [place] [address]  CBC with Differential     Status: Abnormal   Collection Time: 08/08/18 10:40 AM  Result Value Ref Range   WBC 6.0 4.0 - 10.5 K/uL   RBC 3.63 (L) 4.22 - 5.81 MIL/uL   Hemoglobin 10.0 (L) 13.0 - 17.0 g/dL   HCT 31.5 (L) 39.0 - 52.0 %   MCV 86.8 80.0 - 100.0 fL   MCH 27.5 26.0 - 34.0 pg   MCHC 31.7 30.0 - 36.0 g/dL   RDW 16.4 (H) 11.5 - 15.5 %   Platelets 165 150 - 400 K/uL   nRBC 0.0 0.0 - 0.2 %  Neutrophils Relative % 71 %   Neutro Abs 4.3 1.7 - 7.7 K/uL   Lymphocytes Relative 16 %   Lymphs Abs 1.0 0.7 - 4.0 K/uL   Monocytes Relative 9 %   Monocytes Absolute 0.5 0.1 - 1.0 K/uL   Eosinophils Relative 4 %   Eosinophils Absolute 0.2 0.0 - 0.5 K/uL   Basophils Relative 0 %   Basophils Absolute 0.0 0.0 - 0.1 K/uL   Immature Granulocytes 0 %   Abs Immature Granulocytes 0.01 0.00 - 0.07 K/uL    Comment: Performed at [facility], [place] [address], [place], [place] [address]  Urinalysis, Routine w reflex microscopic     Status: Abnormal   Collection Time: 08/08/18 11:10 AM  Result Value Ref Range   Color, Urine RED (A) YELLOW    Comment: BIOCHEMICALS MAY BE AFFECTED BY COLOR   APPearance TURBID (A) CLEAR   Specific Gravity, Urine  1.005 - 1.030    TEST NOT REPORTED DUE TO COLOR INTERFERENCE OF URINE PIGMENT   pH  5.0 - 8.0    TEST NOT REPORTED DUE TO COLOR INTERFERENCE OF URINE PIGMENT   Glucose, UA (A) NEGATIVE mg/dL    TEST NOT REPORTED DUE TO COLOR INTERFERENCE OF URINE PIGMENT   Hgb urine dipstick (A) NEGATIVE    TEST NOT REPORTED DUE TO COLOR INTERFERENCE OF URINE PIGMENT   Bilirubin Urine (A) NEGATIVE    TEST NOT REPORTED DUE TO COLOR INTERFERENCE OF URINE PIGMENT   Ketones, ur (A) NEGATIVE mg/dL    TEST NOT REPORTED DUE TO COLOR INTERFERENCE OF URINE PIGMENT   Protein, ur (A) NEGATIVE mg/dL    TEST NOT REPORTED DUE TO COLOR INTERFERENCE OF URINE PIGMENT   Nitrite (A) NEGATIVE     TEST NOT REPORTED DUE TO COLOR INTERFERENCE OF URINE PIGMENT   Leukocytes, UA (A) NEGATIVE    TEST NOT REPORTED DUE TO COLOR INTERFERENCE OF URINE PIGMENT    Comment: Performed at [facility], [place] [address], [place], [place] [address]  Urinalysis, Microscopic (reflex)     Status: None   Collection Time: 08/08/18 11:10 AM  Result Value Ref Range   RBC / HPF >50 0 - 5 RBC/hpf   WBC, UA 11-20 0 - 5 WBC/hpf   Bacteria, UA NONE SEEN NONE SEEN   Squamous Epithelial / LPF 0-5 0 - 5   Budding Yeast PRESENT     Comment: Performed at [facility], [place] [address] [place], [place] [address]  Glucose, capillary     Status: Abnormal   Collection Time: 08/08/18  4:43 PM  Result Value Ref Range   Glucose-Capillary 151 (H) 70 - 99 mg/dL   Recent Results (from the past 240 hour(s))  Urine C&S     Status: Abnormal   Collection Time: 08/04/18  3:58 AM  Result Value Ref Range Status   Specimen Description   Final    URINE, CATHETERIZED Performed at [facility] [address] [place], [place] [address]    Special Requests   Final    NONE Performed at [facility], [place] [address], [place], [place] [address]    Culture (A)  Final    >=100,000 COLONIES/mL ENTEROCOCCUS FAECALIS 50,000 COLONIES/mL YEAST    Report Status 08/07/2018 FINAL  Final   Organism ID, Bacteria ENTEROCOCCUS FAECALIS (A)  Final      Susceptibility   Enterococcus faecalis - MIC*    AMPICILLIN <=2 SENSITIVE Sensitive  LEVOFLOXACIN 2 SENSITIVE Sensitive     NITROFURANTOIN <=16 SENSITIVE Sensitive     VANCOMYCIN 1 SENSITIVE Sensitive     * >=100,000 COLONIES/mL ENTEROCOCCUS FAECALIS   Creatinine: Recent Labs    08/04/18 0358 08/08/18 1040  CREATININE 1.44* 1.27*   Baseline Creatinine: 1.2-1.3  Impression/Assessment:  85yo with urinary retention and gross hematuria  Plan:  1. Urinary retention: Currently the patient is managed with  an indwelling SP tube. The SP tube is draining well and does not need to be changed at this time.  2. Gross hematuria: I discussed the various causes of gross hematuria with the patient and family. His gross hematuria is likely related to hemorrhagic cystitis from UTI in combination with pradaxa. Please hold pradaxa if it is safe from a cardiac standpoint. Please obtain a urine culture and start broad spectrum antibiotics pending the culture. Urology to continue to follow  Nicolette Bang 08/08/2018, 9:20 PM

## 2018-08-09 DIAGNOSIS — Z951 Presence of aortocoronary bypass graft: Secondary | ICD-10-CM | POA: Diagnosis not present

## 2018-08-09 DIAGNOSIS — N183 Chronic kidney disease, stage 3 (moderate): Secondary | ICD-10-CM | POA: Diagnosis present

## 2018-08-09 DIAGNOSIS — R338 Other retention of urine: Secondary | ICD-10-CM | POA: Diagnosis not present

## 2018-08-09 DIAGNOSIS — R319 Hematuria, unspecified: Secondary | ICD-10-CM | POA: Diagnosis not present

## 2018-08-09 DIAGNOSIS — I255 Ischemic cardiomyopathy: Secondary | ICD-10-CM | POA: Diagnosis present

## 2018-08-09 DIAGNOSIS — E785 Hyperlipidemia, unspecified: Secondary | ICD-10-CM | POA: Diagnosis present

## 2018-08-09 DIAGNOSIS — N390 Urinary tract infection, site not specified: Secondary | ICD-10-CM

## 2018-08-09 DIAGNOSIS — I4821 Permanent atrial fibrillation: Secondary | ICD-10-CM | POA: Diagnosis present

## 2018-08-09 DIAGNOSIS — Z7901 Long term (current) use of anticoagulants: Secondary | ICD-10-CM | POA: Diagnosis not present

## 2018-08-09 DIAGNOSIS — I2510 Atherosclerotic heart disease of native coronary artery without angina pectoris: Secondary | ICD-10-CM | POA: Diagnosis present

## 2018-08-09 DIAGNOSIS — Z79890 Hormone replacement therapy (postmenopausal): Secondary | ICD-10-CM | POA: Diagnosis not present

## 2018-08-09 DIAGNOSIS — Z955 Presence of coronary angioplasty implant and graft: Secondary | ICD-10-CM | POA: Diagnosis not present

## 2018-08-09 DIAGNOSIS — N401 Enlarged prostate with lower urinary tract symptoms: Secondary | ICD-10-CM | POA: Diagnosis present

## 2018-08-09 DIAGNOSIS — I5022 Chronic systolic (congestive) heart failure: Secondary | ICD-10-CM | POA: Diagnosis present

## 2018-08-09 DIAGNOSIS — E039 Hypothyroidism, unspecified: Secondary | ICD-10-CM | POA: Diagnosis present

## 2018-08-09 DIAGNOSIS — Y846 Urinary catheterization as the cause of abnormal reaction of the patient, or of later complication, without mention of misadventure at the time of the procedure: Secondary | ICD-10-CM | POA: Diagnosis present

## 2018-08-09 DIAGNOSIS — E1122 Type 2 diabetes mellitus with diabetic chronic kidney disease: Secondary | ICD-10-CM | POA: Diagnosis present

## 2018-08-09 DIAGNOSIS — N3001 Acute cystitis with hematuria: Secondary | ICD-10-CM | POA: Diagnosis present

## 2018-08-09 DIAGNOSIS — Z79899 Other long term (current) drug therapy: Secondary | ICD-10-CM | POA: Diagnosis not present

## 2018-08-09 DIAGNOSIS — Z7984 Long term (current) use of oral hypoglycemic drugs: Secondary | ICD-10-CM | POA: Diagnosis not present

## 2018-08-09 DIAGNOSIS — Z950 Presence of cardiac pacemaker: Secondary | ICD-10-CM | POA: Diagnosis not present

## 2018-08-09 DIAGNOSIS — F0390 Unspecified dementia without behavioral disturbance: Secondary | ICD-10-CM | POA: Diagnosis present

## 2018-08-09 DIAGNOSIS — I130 Hypertensive heart and chronic kidney disease with heart failure and stage 1 through stage 4 chronic kidney disease, or unspecified chronic kidney disease: Secondary | ICD-10-CM | POA: Diagnosis present

## 2018-08-09 DIAGNOSIS — R310 Gross hematuria: Secondary | ICD-10-CM | POA: Diagnosis not present

## 2018-08-09 DIAGNOSIS — T83098A Other mechanical complication of other indwelling urethral catheter, initial encounter: Secondary | ICD-10-CM | POA: Diagnosis present

## 2018-08-09 LAB — CBC
HEMATOCRIT: 29.1 % — AB (ref 39.0–52.0)
Hemoglobin: 9.3 g/dL — ABNORMAL LOW (ref 13.0–17.0)
MCH: 27.9 pg (ref 26.0–34.0)
MCHC: 32 g/dL (ref 30.0–36.0)
MCV: 87.4 fL (ref 80.0–100.0)
Platelets: 148 10*3/uL — ABNORMAL LOW (ref 150–400)
RBC: 3.33 MIL/uL — ABNORMAL LOW (ref 4.22–5.81)
RDW: 16.6 % — ABNORMAL HIGH (ref 11.5–15.5)
WBC: 6 10*3/uL (ref 4.0–10.5)
nRBC: 0 % (ref 0.0–0.2)

## 2018-08-09 LAB — BASIC METABOLIC PANEL
Anion gap: 8 (ref 5–15)
BUN: 39 mg/dL — ABNORMAL HIGH (ref 8–23)
CO2: 24 mmol/L (ref 22–32)
Calcium: 8.6 mg/dL — ABNORMAL LOW (ref 8.9–10.3)
Chloride: 105 mmol/L (ref 98–111)
Creatinine, Ser: 1.39 mg/dL — ABNORMAL HIGH (ref 0.61–1.24)
GFR calc Af Amer: 52 mL/min — ABNORMAL LOW (ref >60)
GFR calc non Af Amer: 45 mL/min — ABNORMAL LOW (ref >60)
Glucose, Bld: 136 mg/dL — ABNORMAL HIGH (ref 70–99)
Potassium: 3.4 mmol/L — ABNORMAL LOW (ref 3.5–5.1)
Sodium: 137 mmol/L (ref 135–145)

## 2018-08-09 LAB — GLUCOSE, CAPILLARY
GLUCOSE-CAPILLARY: 139 mg/dL — AB (ref 70–99)
Glucose-Capillary: 122 mg/dL — ABNORMAL HIGH (ref 70–99)
Glucose-Capillary: 133 mg/dL — ABNORMAL HIGH (ref 70–99)
Glucose-Capillary: 174 mg/dL — ABNORMAL HIGH (ref 70–99)

## 2018-08-09 LAB — URINE CULTURE

## 2018-08-09 NOTE — Progress Notes (Signed)
PROGRESS NOTE  WORTHINGTON CRUZAN ASN:[id number] DOB: March 23, 1929 DOA: 08/08/2018 PCP: Redmond School, MD   LOS: 0 days   Brief narrative: Neil [name] is an 85 y.o. male with PMH significant for CKD-III, chronic systolic heart failure, complete heart block, status post pacemaker, chronic suprapubic catheter, diabetes type 2 who presented with recurrent hematuria and altered mental status.  Patient was evaluated in the emergency department few days ago for urinary tract infection and hematuria.  He was a started on oral ciprofloxacin.  Per family urine was getting clear but patient subsequently developed hematuria.  Lately, he has been  more agitated during the day and, more restless at night and he has not been a sleeping.  Evaluation in the ED; sodium 137, creatinine 1.2 hemoglobin 10 platelets 165, a white blood cell 11-20 more than 50 red blood cell.  CT renal stone study; No hydronephrosis or focal discrete obstructing stone identified Bilaterally. Left kidney cyst. Prior urine culture grew with Enterococcus faecalis..  Assessment/Plan:  Active Problems:   Type 2 diabetes mellitus with hyperlipidemia (HCC)   Cardiomyopathy, ischemic   AV BLOCK, COMPLETE   Permanent atrial fibrillation   HYPERTENSION, BENIGN   Pacemaker-St. Jude   CKD (chronic kidney disease) stage 3, GFR 30-59 ml/min (HCC)   UTI (urinary tract infection)  1-UTI secondary to suprapubic catheter, hematuria: Patient develop recurrent hematuria.  Pradaxa was kept on hold.  He has multiple intolerance and allergy to antibiotics.  For this reason, patient was continued on ciprofloxacin, IV. Pending urine culture.  Urology consult appeciated. . Continue IV fluids..   2-hypertension - Lasix on hold.   3-diabetes -glipizide remains on hold.  Currently on insulin sliding scale with Accu-Cheks.  4-history of A. fib, AV block, status post pacemaker: Hold Pradaxa due to hematuria.  5-dementia; Continue with Seroquel  Requip,  6-hypothyroidism; Continue with Synthroid.  7-GI; Loose stool; monitor. Continue with florastore.   DVT Prophylaxis: SCDs Code Status: Partial code, no intubation Family Communication: Wife and daughter at bedside. Patient is still is to monitor for another day at least 24 hours for hematuria.  I changed him to inpatient status today.  Antibiotics: Antibiotics Given (last 72 hours)    Date/Time Action Medication Dose Rate   08/08/18 1405 New Bag/Given   ciprofloxacin (CIPRO) IVPB 400 mg 400 mg 200 mL/hr   08/09/18 0223 New Bag/Given   ciprofloxacin (CIPRO) IVPB 400 mg 400 mg 200 mL/hr   08/09/18 1311 New Bag/Given   ciprofloxacin (CIPRO) IVPB 400 mg 400 mg 200 mL/hr      Continuous Infusions:  . sodium chloride 75 mL/hr at 08/09/18 1147  . ciprofloxacin 400 mg (08/09/18 1311)    Scheduled Meds: . atorvastatin  40 mg Oral Daily  . ferrous sulfate  325 mg Oral Daily  . insulin aspart  0-9 Units Subcutaneous TID WC  . lactobacillus acidophilus & bulgar  2 tablet Oral QHS  . levothyroxine  25 mcg Oral QAC breakfast  . pantoprazole  40 mg Oral Q1200  . QUEtiapine  50 mg Oral QHS  . rOPINIRole  1 mg Oral QHS    PRN meds: acetaminophen **OR** acetaminophen, benzonatate, ondansetron **OR** ondansetron (ZOFRAN) IV, RESOURCE THICKENUP CLEAR   Subjective: Patient seen and examined this morning.  Patient elderly Caucasian male.  Lying in bed.  Wife and daughter at bedside.  Patient not in distress.  Does not have any blood in suprapubic catheter at this time, however the urine bag has dark red urine in  it.  Objective: Vitals:   08/08/18 2131 08/09/18 0558  BP: 102/61 110/65  Pulse: 78 78  Resp: 16 15  Temp: 98.4 F (36.9 C) 97.7 F (36.5 C)  SpO2: 97% 96%    Intake/Output Summary (Last 24 hours) at 08/09/2018 1328 Last data filed at 08/09/2018 1100 Gross per 24 hour  Intake 1971.25 ml  Output 2225 ml  Net -253.75 ml   Filed Weights   08/08/18 0858    Weight: 69 kg   Body mass index is 21.83 kg/m.   Physical Exam: GENERAL: Pleasant elderly Caucasian male.  No acute distress HENT: No scleral pallor or icterus. Pupils equally reactive to light. Oral mucosa is moist NECK: is supple, no palpable thyroid enlargement. CHEST: Clear to auscultation. No crackles or wheezes. Non tender on palpation. Diminished breath sounds bilaterally. CVS: S1 and S2 heard, no murmur. Regular rate and rhythm. No pericardial rub. ABDOMEN: Soft, non-tender, bowel sounds are present. No palpable hepato-splenomegaly. EXTREMITIES: No edema. CNS: Alert, awake, oriented to place and person SKIN: warm and dry without rashes.  Data Review: I have personally reviewed the laboratory data and studies available.  Terrilee Croak, MD  [facility] 08/09/2018

## 2018-08-09 NOTE — Progress Notes (Signed)
  Subjective: Urine clear today. No abdominal pain.  Urine culture pending  Objective: Vital signs in last 24 hours: Temp:  [97.7 F (36.5 C)-98.8 F (37.1 C)] 98.5 F (36.9 C) (01/31 1342) Pulse Rate:  [48-78] 48 (01/31 1342) Resp:  [15-16] 16 (01/31 1342) BP: (102-129)/(60-75) 111/60 (01/31 1342) SpO2:  [96 %-99 %] 99 % (01/31 1342)  Intake/Output from previous day: 01/30 0701 - 01/31 0700 In: 1771.3 [P.O.:580; I.V.:1191.3] Out: 2100 [Urine:2100] Intake/Output this shift: Total I/O In: 914.6 [P.O.:25; I.V.:537.8; IV Piggyback:351.8] Out: 275 [Urine:275]  Physical Exam:  General:alert, cooperative and appears stated age GI: soft, non tender, normal bowel sounds, no palpable masses, no organomegaly, no inguinal hernia Male genitalia: not done Extremities: extremities normal, atraumatic, no cyanosis or edema  Lab Results: Recent Labs    08/08/18 1040 08/09/18 0438  HGB 10.0* 9.3*  HCT 31.5* 29.1*   BMET Recent Labs    08/08/18 1040 08/09/18 0438  NA 137 137  K 3.6 3.4*  CL 102 105  CO2 28 24  GLUCOSE 90 136*  BUN 38* 39*  CREATININE 1.27* 1.39*  CALCIUM 9.0 8.6*   No results for input(s): LABPT, INR in the last 72 hours. No results for input(s): LABURIN in the last 72 hours. Results for orders placed or performed during the hospital encounter of 08/08/18  Urine culture     Status: Abnormal   Collection Time: 08/08/18 11:10 AM  Result Value Ref Range Status   Specimen Description URINE, CLEAN CATCH  Final   Special Requests   Final    NONE Performed at [facility], [place] [address] [place], [place] [address]    Culture 50,000 COLONIES/mL YEAST (A)  Final   Report Status 08/09/2018 FINAL  Final    Studies/Results: Ct Renal Stone Study  Result Date: 08/08/2018 CLINICAL DATA:  Hematuria. EXAM: CT ABDOMEN AND PELVIS WITHOUT CONTRAST TECHNIQUE: Multidetector CT imaging of the abdomen and pelvis was performed following the standard  protocol without IV contrast. COMPARISON:  March 03, 2018 FINDINGS: Lower chest: The heart size is enlarged. Mild atelectasis of both lung bases are noted. Hepatobiliary: No focal liver abnormality is seen. No gallstones, gallbladder wall thickening, or biliary dilatation. Pancreas: Unremarkable. No pancreatic ductal dilatation or surrounding inflammatory changes. Spleen: Normal in size without focal abnormality. Adrenals/Urinary Tract: The bilateral adrenal glands are normal. There is a cyst in the midpole left kidney. Left renal vascular calcifications are noted. There is no hydronephrosis bilaterally. The bladder is decompressed with a suprapubic catheter in place. Stomach/Bowel: Stomach is within normal limits. Appendix appears normal. No evidence of bowel wall thickening, distention, or inflammatory changes. There is diverticulosis of colon without diverticulitis. Moderate bowel content is identified in the rectum. Vascular/Lymphatic: Aortic atherosclerosis. No enlarged abdominal or pelvic lymph nodes. Reproductive: Prostate is unremarkable. Other: None. Musculoskeletal: Degenerative joint changes of the spine are noted. IMPRESSION: No hydronephrosis or focal discrete obstructing stone identified bilaterally. Left kidney cyst. No acute abnormality identified in the abdomen and pelvis. Electronically Signed   By: Abelardo Diesel M.D.   On: 08/08/2018 11:05    Assessment/Plan: 85yo with gross hematuria  1. Gross hematuria: resolved. Please continue cipro 500mg  BID for 7 days. Patient can restart pradax in 3-4 days. The patient should followup with Dr. Jeffie Pollock in 1-2 weeks   LOS: 0 days   Nicolette Bang 08/09/2018, 4:01 PM

## 2018-08-09 NOTE — Plan of Care (Signed)
  Problem: Nutrition: Goal: Adequate nutrition will be maintained Outcome: Progressing   Problem: Elimination: Goal: Will not experience complications related to bowel motility Outcome: Progressing   Problem: Activity: Goal: Risk for activity intolerance will decrease Outcome: Progressing   

## 2018-08-10 LAB — GLUCOSE, CAPILLARY: Glucose-Capillary: 115 mg/dL — ABNORMAL HIGH (ref 70–99)

## 2018-08-10 MED ORDER — LACTINEX PO CHEW: 2.0000 | CHEWABLE_TABLET | Freq: Every day | ORAL | 0 refills | Status: AC

## 2018-08-10 MED ORDER — CIPROFLOXACIN HCL 500 MG PO TABS: 500.0000 mg | ORAL_TABLET | Freq: Two times a day (BID) | ORAL | 0 refills | Status: DC

## 2018-08-10 NOTE — Discharge Summary (Signed)
Physician Discharge Summary  Neil [name] EVO:[id number] DOB: 1928-12-29 DOA: 08/08/2018  PCP: Redmond School, MD  Admit date: 08/08/2018 Discharge date: 08/10/2018  Admitted From: Home  Discharge disposition: home   Recommendations for Outpatient Follow-Up:   The patient should followup with Dr. Jeffie Pollock in 1-2 weeks  Discharge Diagnosis:   Active Problems:   Type 2 diabetes mellitus with hyperlipidemia (HCC)   Cardiomyopathy, ischemic   AV BLOCK, COMPLETE   Permanent atrial fibrillation   HYPERTENSION, BENIGN   Pacemaker-St. Jude   CKD (chronic kidney disease) stage 3, GFR 30-59 ml/min (HCC)   UTI (urinary tract infection)    Discharge Condition: Improved.  Diet recommendation: Low sodium, heart healthy.  Carbohydrate-modified.  Regular.  Wound care: None.  Code status: Full.   History of Present Illness:   Neil [name] an 85 y.o.malewith PMH significant for CKD-III, chronic systolic heart failure, complete heart block, status post pacemaker, chronic suprapubic catheter,diabetes type 2 who presented with recurrent hematuria and altered mental status. Patient was evaluated in the emergency department few days ago for urinary tract infection and hematuria. He was a started on oral ciprofloxacin. Per family urine was getting clear but patient subsequently developed hematuria. Lately, he has been more agitated during the day and, more restless at night and he has not been a sleeping.  Evaluation in the ED;sodium 137, creatinine 1.2 hemoglobin 10 platelets 165, a white blood cell 11-20 more than 50 red blood cell. CT renal stone study;No hydronephrosis or focal discrete obstructing stone identified Bilaterally. Left kidney cyst. Prior urine culture grew with Enterococcus faecalis.Marland Kitchen   Hospital Course:   1-UTI secondary to suprapubic catheter, hematuria: Patient develop recurrent hematuria. Pradaxa was kept on hold.  He has multiple intolerance and  allergy to antibiotics. For this reason, patient was continued on ciprofloxacin, IV. No bacterial growth in urine culture. Urology consult appeciated.   Recommended discharged home today on ciprofloxacin 5 mg twice daily for 7 days.  Discussed the plan with patient's daughter at bedside.  She is worried that patient may not respond well to 7 days of antibiotics.  At her request, will keep antibiotics for 2 weeks.  Patient daughter states that she gives probiotics to the patient on a regular basis.  Pradaxa is currently on hold.  Can resume after 3 to 4 days per urology recommendation.  Patient will follow-up with Dr. Jeffie Pollock in 1-2 weeks  2-hypertension -resume Lasix.  3-diabetes -glipizide to resume.  4-history of A. fib, AV block, status post pacemaker: Pradaxa to resume in next 2 to 4 days  5-dementia; Continue with Seroquel, Requip,  6-hypothyroidism; Continue with Synthroid.  7-GI; Loose stool; monitor. Continue with florastore.  8.  Acute delirium -last time, patient was hallucinating.  Family was concerned that it could be because of worsening urinary tract infection.  I believe this is hospital and discharge him in the background dementia.  At this time, patient probably will continue to have delirium in the hospital.  The best option for this patient at this time is to discharge to home in his family environment.  Stable for discharge home today.   Medical Consultants:    Urology   Discharge Exam:   Vitals:   08/09/18 2118 08/10/18 0624  BP: 129/75 121/70  Pulse: 77 76  Resp: 17 16  Temp: (!) 97.5 F (36.4 C) 98.2 F (36.8 C)  SpO2: 99% 99%   Vitals:   08/09/18 0558 08/09/18 1342 08/09/18 2118 08/10/18 0624  BP:  110/65 111/60 129/75 121/70  Pulse: 78 (!) 48 77 76  Resp: 15 16 17 16   Temp: 97.7 F (36.5 C) 98.5 F (36.9 C) (!) 97.5 F (36.4 C) 98.2 F (36.8 C)  TempSrc: Oral Oral Oral Oral  SpO2: 96% 99% 99% 99%  Weight:      Height:        General  exam: Appears calm and comfortable.  Elderly white male.  Not in distress Respiratory system: Clear to auscultation. Respiratory effort normal. Cardiovascular system: S1 & S2 heard, RRR. No JVD,  rubs, gallops or clicks. No murmurs. Gastrointestinal system: Abdomen is nondistended, soft and nontender. No organomegaly or masses felt. Normal bowel sounds heard.  Suprapubic catheter in place. Central nervous system: Alert and oriented. No focal neurological deficits. Extremities: No clubbing,  or cyanosis. No edema. Skin: No rashes, lesions or ulcers. Psychiatry: Judgement and insight appear normal. Mood & affect appropriate.    The results of significant diagnostics from this hospitalization (including imaging, microbiology, ancillary and laboratory) are listed below for reference.     Procedures and Diagnostic Studies:   Ct Renal Stone Study  Result Date: 08/08/2018 CLINICAL DATA:  Hematuria. EXAM: CT ABDOMEN AND PELVIS WITHOUT CONTRAST TECHNIQUE: Multidetector CT imaging of the abdomen and pelvis was performed following the standard protocol without IV contrast. COMPARISON:  March 03, 2018 FINDINGS: Lower chest: The heart size is enlarged. Mild atelectasis of both lung bases are noted. Hepatobiliary: No focal liver abnormality is seen. No gallstones, gallbladder wall thickening, or biliary dilatation. Pancreas: Unremarkable. No pancreatic ductal dilatation or surrounding inflammatory changes. Spleen: Normal in size without focal abnormality. Adrenals/Urinary Tract: The bilateral adrenal glands are normal. There is a cyst in the midpole left kidney. Left renal vascular calcifications are noted. There is no hydronephrosis bilaterally. The bladder is decompressed with a suprapubic catheter in place. Stomach/Bowel: Stomach is within normal limits. Appendix appears normal. No evidence of bowel wall thickening, distention, or inflammatory changes. There is diverticulosis of colon without diverticulitis.  Moderate bowel content is identified in the rectum. Vascular/Lymphatic: Aortic atherosclerosis. No enlarged abdominal or pelvic lymph nodes. Reproductive: Prostate is unremarkable. Other: None. Musculoskeletal: Degenerative joint changes of the spine are noted. IMPRESSION: No hydronephrosis or focal discrete obstructing stone identified bilaterally. Left kidney cyst. No acute abnormality identified in the abdomen and pelvis. Electronically Signed   By: Abelardo Diesel M.D.   On: 08/08/2018 11:05     Labs:   Basic Metabolic Panel: Recent Labs  Lab 08/04/18 0358 08/08/18 1040 08/09/18 0438  NA 135 137 137  K 3.3* 3.6 3.4*  CL 100 102 105  CO2 25 28 24   GLUCOSE 91 90 136*  BUN 53* 38* 39*  CREATININE 1.44* 1.27* 1.39*  CALCIUM 9.0 9.0 8.6*   GFR Estimated Creatinine Clearance: 36.5 mL/min (A) (by C-G formula based on SCr of 1.39 mg/dL (H)). Liver Function Tests: Recent Labs  Lab 08/04/18 0358  AST 17  ALT 10  ALKPHOS 61  BILITOT 1.0  PROT 6.9  ALBUMIN 3.4*   No results for input(s): LIPASE, AMYLASE in the last 168 hours. No results for input(s): AMMONIA in the last 168 hours. Coagulation profile No results for input(s): INR, PROTIME in the last 168 hours.  CBC: Recent Labs  Lab 08/04/18 0358 08/08/18 1040 08/09/18 0438  WBC 6.1 6.0 6.0  NEUTROABS 4.2 4.3  --   HGB 10.3* 10.0* 9.3*  HCT 32.4* 31.5* 29.1*  MCV 85.5 86.8 87.4  PLT 170 165 148*  Cardiac Enzymes: No results for input(s): CKTOTAL, CKMB, CKMBINDEX, TROPONINI in the last 168 hours. BNP: Invalid input(s): POCBNP CBG: Recent Labs  Lab 08/08/18 2132 08/09/18 0753 08/09/18 1150 08/09/18 1628 08/09/18 2123  GLUCAP 137* 122* 133* 174* 139*   D-Dimer No results for input(s): DDIMER in the last 72 hours. Hgb A1c No results for input(s): HGBA1C in the last 72 hours. Lipid Profile No results for input(s): CHOL, HDL, LDLCALC, TRIG, CHOLHDL, LDLDIRECT in the last 72 hours. Thyroid function studies No  results for input(s): TSH, T4TOTAL, T3FREE, THYROIDAB in the last 72 hours.  Invalid input(s): FREET3 Anemia work up No results for input(s): VITAMINB12, FOLATE, FERRITIN, TIBC, IRON, RETICCTPCT in the last 72 hours. Microbiology Recent Results (from the past 240 hour(s))  Urine C&S     Status: Abnormal   Collection Time: 08/04/18  3:58 AM  Result Value Ref Range Status   Specimen Description   Final    URINE, CATHETERIZED Performed at [facility] [address] [place], [place] [address]    Special Requests   Final    NONE Performed at [facility], [place] [address], [place], [place] [address]    Culture (A)  Final    >=100,000 COLONIES/mL ENTEROCOCCUS FAECALIS 50,000 COLONIES/mL YEAST    Report Status 08/07/2018 FINAL  Final   Organism ID, Bacteria ENTEROCOCCUS FAECALIS (A)  Final      Susceptibility   Enterococcus faecalis - MIC*    AMPICILLIN <=2 SENSITIVE Sensitive     LEVOFLOXACIN 2 SENSITIVE Sensitive     NITROFURANTOIN <=16 SENSITIVE Sensitive     VANCOMYCIN 1 SENSITIVE Sensitive     * >=100,000 COLONIES/mL ENTEROCOCCUS FAECALIS  Urine culture     Status: Abnormal   Collection Time: 08/08/18 11:10 AM  Result Value Ref Range Status   Specimen Description URINE, CLEAN CATCH  Final   Special Requests   Final    NONE Performed at [facility] [address], [place], [place] [address]    Culture 50,000 COLONIES/mL YEAST (A)  Final   Report Status 08/09/2018 FINAL  Final     Discharge Instructions:    Allergies as of 08/10/2018      Reactions   Cephalexin Other (See Comments)   hallucinations Other reaction(s): Delusions (intolerance)   Penicillins Swelling, Rash   Itching Has patient had a PCN reaction causing immediate rash, facial/tongue/throat swelling, SOB or lightheadedness with hypotension: Yes Has patient had a PCN reaction causing severe rash involving mucus membranes or skin necrosis: Yes Has  patient had a PCN reaction that required hospitalization: No Has patient had a PCN reaction occurring within the last 10 years: No If all of the above answers are "NO", then may proceed with Cephalosporin use.      Medication List    TAKE these medications   albuterol (2.5 MG/3ML) 0.083% nebulizer solution Commonly known as:  PROVENTIL Take 3 mLs (2.5 mg total) by nebulization every 6 (six) hours as needed for wheezing or shortness of breath.   atorvastatin 40 MG tablet Commonly known as:  LIPITOR Take 40 mg by mouth daily at 12 noon.   benzonatate 100 MG capsule Commonly known as:  TESSALON Take 100 mg by mouth 3 (three) times daily as needed for cough.   ciprofloxacin 500 MG tablet Commonly known as:  CIPRO Take 1 tablet (500 mg total) by mouth every 12 (twelve) hours.   dabigatran 75 MG Caps capsule Commonly known as:  PRADAXA  Take 75 mg by mouth daily. (RESUME IN NEXT 3-4 DAYS)   feeding supplement (ENSURE ENLIVE) Liqd Take 237 mLs by mouth 2 (two) times daily between meals. What changed:  when to take this   FEROSUL 325 (65 FE) MG tablet Generic drug:  ferrous sulfate Take 325 mg by mouth daily.   furosemide 40 MG tablet Commonly known as:  LASIX Take 1 tablet (40 mg total) by mouth daily.   glipiZIDE 2.5 MG 24 hr tablet Commonly known as:  GLUCOTROL XL Take 2.5-5 mg by mouth as directed. Depends on blood sugar. Checks twice daily.   lactobacillus acidophilus & bulgar chewable tablet Chew 2 tablets by mouth at bedtime.   levothyroxine 25 MCG tablet Commonly known as:  SYNTHROID, LEVOTHROID Take 25 mcg by mouth daily at 12 noon.   pantoprazole 40 MG tablet Commonly known as:  PROTONIX Take 40 mg by mouth daily at 12 noon.   polyethylene glycol packet Commonly known as:  MIRALAX Take 17 g by mouth daily. What changed:    when to take this  reasons to take this   PROBIOTIC-10 PO Take 1 capsule by mouth at bedtime.   QUEtiapine 50 MG  tablet Commonly known as:  SEROQUEL Take 50 mg by mouth at bedtime.   RESOURCE THICKENUP CLEAR Powd Thicken fluids to nectar thick   rOPINIRole 0.5 MG tablet Commonly known as:  REQUIP Take 1 mg by mouth at bedtime.         Time coordinating discharge: 39 minutes  Signed:  Gunda Maqueda  [facility] 08/10/2018, 11:56 AM

## 2018-08-12 DIAGNOSIS — I2510 Atherosclerotic heart disease of native coronary artery without angina pectoris: Secondary | ICD-10-CM | POA: Diagnosis not present

## 2018-08-12 DIAGNOSIS — F0390 Unspecified dementia without behavioral disturbance: Secondary | ICD-10-CM | POA: Diagnosis not present

## 2018-08-12 DIAGNOSIS — E1122 Type 2 diabetes mellitus with diabetic chronic kidney disease: Secondary | ICD-10-CM | POA: Diagnosis not present

## 2018-08-12 DIAGNOSIS — I255 Ischemic cardiomyopathy: Secondary | ICD-10-CM | POA: Diagnosis not present

## 2018-08-12 DIAGNOSIS — I130 Hypertensive heart and chronic kidney disease with heart failure and stage 1 through stage 4 chronic kidney disease, or unspecified chronic kidney disease: Secondary | ICD-10-CM | POA: Diagnosis not present

## 2018-08-12 DIAGNOSIS — I5022 Chronic systolic (congestive) heart failure: Secondary | ICD-10-CM | POA: Diagnosis not present

## 2018-08-13 DIAGNOSIS — N312 Flaccid neuropathic bladder, not elsewhere classified: Secondary | ICD-10-CM | POA: Diagnosis not present

## 2018-08-13 DIAGNOSIS — I2510 Atherosclerotic heart disease of native coronary artery without angina pectoris: Secondary | ICD-10-CM | POA: Diagnosis not present

## 2018-08-13 DIAGNOSIS — I130 Hypertensive heart and chronic kidney disease with heart failure and stage 1 through stage 4 chronic kidney disease, or unspecified chronic kidney disease: Secondary | ICD-10-CM | POA: Diagnosis not present

## 2018-08-13 DIAGNOSIS — I5022 Chronic systolic (congestive) heart failure: Secondary | ICD-10-CM | POA: Diagnosis not present

## 2018-08-13 DIAGNOSIS — R338 Other retention of urine: Secondary | ICD-10-CM | POA: Diagnosis not present

## 2018-08-13 DIAGNOSIS — N3001 Acute cystitis with hematuria: Secondary | ICD-10-CM | POA: Diagnosis not present

## 2018-08-13 DIAGNOSIS — E1122 Type 2 diabetes mellitus with diabetic chronic kidney disease: Secondary | ICD-10-CM | POA: Diagnosis not present

## 2018-08-13 DIAGNOSIS — I255 Ischemic cardiomyopathy: Secondary | ICD-10-CM | POA: Diagnosis not present

## 2018-08-13 DIAGNOSIS — F0390 Unspecified dementia without behavioral disturbance: Secondary | ICD-10-CM | POA: Diagnosis not present

## 2018-08-14 ENCOUNTER — Ambulatory Visit: Payer: Self-pay

## 2018-08-14 DIAGNOSIS — I5020 Unspecified systolic (congestive) heart failure: Secondary | ICD-10-CM | POA: Diagnosis not present

## 2018-08-14 DIAGNOSIS — Z681 Body mass index (BMI) 19 or less, adult: Secondary | ICD-10-CM | POA: Diagnosis not present

## 2018-08-14 DIAGNOSIS — N390 Urinary tract infection, site not specified: Secondary | ICD-10-CM | POA: Diagnosis not present

## 2018-08-14 DIAGNOSIS — I700 Atherosclerosis of aorta: Secondary | ICD-10-CM | POA: Diagnosis not present

## 2018-08-14 DIAGNOSIS — E1129 Type 2 diabetes mellitus with other diabetic kidney complication: Secondary | ICD-10-CM | POA: Diagnosis not present

## 2018-08-14 DIAGNOSIS — I10 Essential (primary) hypertension: Secondary | ICD-10-CM | POA: Diagnosis not present

## 2018-08-14 DIAGNOSIS — R600 Localized edema: Secondary | ICD-10-CM | POA: Diagnosis not present

## 2018-08-15 DIAGNOSIS — I130 Hypertensive heart and chronic kidney disease with heart failure and stage 1 through stage 4 chronic kidney disease, or unspecified chronic kidney disease: Secondary | ICD-10-CM | POA: Diagnosis not present

## 2018-08-15 DIAGNOSIS — I255 Ischemic cardiomyopathy: Secondary | ICD-10-CM | POA: Diagnosis not present

## 2018-08-15 DIAGNOSIS — I5022 Chronic systolic (congestive) heart failure: Secondary | ICD-10-CM | POA: Diagnosis not present

## 2018-08-15 DIAGNOSIS — I2510 Atherosclerotic heart disease of native coronary artery without angina pectoris: Secondary | ICD-10-CM | POA: Diagnosis not present

## 2018-08-15 DIAGNOSIS — F0390 Unspecified dementia without behavioral disturbance: Secondary | ICD-10-CM | POA: Diagnosis not present

## 2018-08-15 DIAGNOSIS — E1122 Type 2 diabetes mellitus with diabetic chronic kidney disease: Secondary | ICD-10-CM | POA: Diagnosis not present

## 2018-08-16 ENCOUNTER — Other Ambulatory Visit: Payer: Self-pay

## 2018-08-16 NOTE — Patient Outreach (Signed)
[facility]) Care Management  08/16/2018  Neil [name] 23-Dec-1928 [id number]   Successful outreach with Neil [name] primary caregiver Modena Nunnery. Reported that Neil [name] was doing well, and caretakers were compliant with administering medications as prescribed, following safety and fall precautions, and preparing low sodium meals. Reported that he continues to receive physical therapy and skilled nursing services with [facility]. Also receiving services four hours a day with personal care aide.  Discussed plan for monitoring weights and measuring intake/output. [name] reported that Neil [name]'s suprapubic catheter has drained well since last week. Denied bloody or discolored urine in the collection bag today. No drainage or foul odor to insertion site. Reported next catheter exchange is scheduled for 09/04/18.  Reported that MD discussed changing the catheter every two weeks from that point on due to frequent catheter blockages. If approved, home health nurse will complete catheter exchanges in the home. Fluid retention continue to be an area of concern. Provided contact information from multiple medical equipment vendors that supply wheelchair scales. [name] reported that Neil [name] was recently able to stand on a scale with the assistance of physical therapy staff. Reported that they will consider a scale with safety handrails if he continues to progress.   Neil [name] has a strong support system in the home and primary caregiver Modena Nunnery is very knowledgeable of signs and symptoms that require urgent medical attention. Per [name], the family does not want to pursue palliative care but aware of available agencies. Very appreciative of services and prefers to remain engaged with [facility] for routine outreach and questions as needed.  PLAN Will continue routine outreach.   [place] [phone]

## 2018-08-17 ENCOUNTER — Emergency Department (HOSPITAL_COMMUNITY)
Admission: EM | Admit: 2018-08-17 | Discharge: 2018-08-17 | Disposition: A | Discharge status: Home / Self Care | Payer: Medicare Other | Attending: Emergency Medicine | Admitting: Emergency Medicine

## 2018-08-17 ENCOUNTER — Encounter (HOSPITAL_COMMUNITY): Payer: Self-pay | Admitting: Emergency Medicine

## 2018-08-17 DIAGNOSIS — N183 Chronic kidney disease, stage 3 (moderate): Secondary | ICD-10-CM | POA: Diagnosis not present

## 2018-08-17 DIAGNOSIS — Z951 Presence of aortocoronary bypass graft: Secondary | ICD-10-CM | POA: Diagnosis not present

## 2018-08-17 DIAGNOSIS — Z7984 Long term (current) use of oral hypoglycemic drugs: Secondary | ICD-10-CM | POA: Diagnosis not present

## 2018-08-17 DIAGNOSIS — N390 Urinary tract infection, site not specified: Secondary | ICD-10-CM | POA: Diagnosis not present

## 2018-08-17 DIAGNOSIS — F0390 Unspecified dementia without behavioral disturbance: Secondary | ICD-10-CM | POA: Diagnosis not present

## 2018-08-17 DIAGNOSIS — I2510 Atherosclerotic heart disease of native coronary artery without angina pectoris: Secondary | ICD-10-CM | POA: Insufficient documentation

## 2018-08-17 DIAGNOSIS — I130 Hypertensive heart and chronic kidney disease with heart failure and stage 1 through stage 4 chronic kidney disease, or unspecified chronic kidney disease: Secondary | ICD-10-CM | POA: Insufficient documentation

## 2018-08-17 DIAGNOSIS — R319 Hematuria, unspecified: Secondary | ICD-10-CM

## 2018-08-17 DIAGNOSIS — I5022 Chronic systolic (congestive) heart failure: Secondary | ICD-10-CM | POA: Insufficient documentation

## 2018-08-17 DIAGNOSIS — Z950 Presence of cardiac pacemaker: Secondary | ICD-10-CM | POA: Insufficient documentation

## 2018-08-17 DIAGNOSIS — E1122 Type 2 diabetes mellitus with diabetic chronic kidney disease: Secondary | ICD-10-CM | POA: Insufficient documentation

## 2018-08-17 DIAGNOSIS — Z79899 Other long term (current) drug therapy: Secondary | ICD-10-CM | POA: Diagnosis not present

## 2018-08-17 LAB — CBC WITH DIFFERENTIAL/PLATELET
Abs Immature Granulocytes: 0.05 10*3/uL (ref 0.00–0.07)
Basophils Absolute: 0 10*3/uL (ref 0.0–0.1)
Basophils Relative: 0 %
EOS PCT: 2 %
Eosinophils Absolute: 0.2 10*3/uL (ref 0.0–0.5)
HCT: 32.8 % — ABNORMAL LOW (ref 39.0–52.0)
HEMOGLOBIN: 10.5 g/dL — AB (ref 13.0–17.0)
Immature Granulocytes: 1 %
Lymphocytes Relative: 11 %
Lymphs Abs: 0.8 10*3/uL (ref 0.7–4.0)
MCH: 27.9 pg (ref 26.0–34.0)
MCHC: 32 g/dL (ref 30.0–36.0)
MCV: 87 fL (ref 80.0–100.0)
MONO ABS: 0.5 10*3/uL (ref 0.1–1.0)
Monocytes Relative: 7 %
NEUTROS ABS: 6.3 10*3/uL (ref 1.7–7.7)
Neutrophils Relative %: 79 %
Platelets: 187 10*3/uL (ref 150–400)
RBC: 3.77 MIL/uL — ABNORMAL LOW (ref 4.22–5.81)
RDW: 17.4 % — ABNORMAL HIGH (ref 11.5–15.5)
WBC: 7.9 10*3/uL (ref 4.0–10.5)
nRBC: 0 % (ref 0.0–0.2)

## 2018-08-17 LAB — URINALYSIS, ROUTINE W REFLEX MICROSCOPIC
Bilirubin Urine: NEGATIVE
GLUCOSE, UA: NEGATIVE mg/dL
Ketones, ur: NEGATIVE mg/dL
Nitrite: NEGATIVE
Protein, ur: NEGATIVE mg/dL
Specific Gravity, Urine: 1.015 (ref 1.005–1.030)
WBC, UA: >50 WBC/hpf — ABNORMAL HIGH (ref 0–5)
pH: 5 (ref 5.0–8.0)

## 2018-08-17 LAB — BASIC METABOLIC PANEL
Anion gap: 9 (ref 5–15)
BUN: 40 mg/dL — AB (ref 8–23)
CO2: 25 mmol/L (ref 22–32)
Calcium: 8.8 mg/dL — ABNORMAL LOW (ref 8.9–10.3)
Chloride: 102 mmol/L (ref 98–111)
Creatinine, Ser: 1.22 mg/dL (ref 0.61–1.24)
GFR calc Af Amer: >60 mL/min (ref >60)
GFR calc non Af Amer: 53 mL/min — ABNORMAL LOW (ref >60)
Glucose, Bld: 131 mg/dL — ABNORMAL HIGH (ref 70–99)
Potassium: 3.9 mmol/L (ref 3.5–5.1)
Sodium: 136 mmol/L (ref 135–145)

## 2018-08-17 MED ORDER — LEVOFLOXACIN 250 MG PO TABS: 500.0000 mg | ORAL_TABLET | Freq: Every day | ORAL | 0 refills | Status: DC

## 2018-08-17 NOTE — ED Triage Notes (Signed)
Pt has blood in urine that started this morning from suprapubic cath. Reports that had same issue last weekend and was seen here for it.

## 2018-08-17 NOTE — Discharge Instructions (Signed)
Follow-up with Dr. Jeffie Pollock on Monday.  Return here for fever, vomiting, change in mental status

## 2018-08-17 NOTE — ED Notes (Signed)
BLADDER SCAN RESULTED 19mL's

## 2018-08-17 NOTE — ED Notes (Signed)
Bed: TX77 Expected date:  Expected time:  Means of arrival:  Comments: Med Ctr tx

## 2018-08-17 NOTE — ED Provider Notes (Signed)
[facility] DEPT Provider Note   CSN: [id number] Arrival date & time: 08/17/18  1029     History   Chief Complaint Chief Complaint  Patient presents with  . blood in urine    HPI Neil [name] is a 85 y.o. male.  85 year old male with history dementia as well as suprapubic catheter presents with blood in his urine x1 day.  Seen recently for same and diagnosed with UTI.  He is currently taking ciprofloxacin this.  Patient had been off his Pradaxa until restarting it yesterday when his symptoms began again.  His mental status is at his baseline.  Patient lives at home with his wife and no new changes noted no reported fevers.  No treatment used for this prior to arrival     Past Medical History:  Diagnosis Date  . Chronic kidney disease (CKD), stage III (moderate) (HCC)   . Chronic systolic CHF (congestive heart failure) ([place])   . Complete heart block ([place])   . Coronary artery disease    status post [id number]  . Dementia ([place])   . Dyslipidemia   . Foley catheter in place   . Hypertension   . Ischemic cardiomyopathy    per last echo 11-10-2017  ef 15-20%  . Osteoarthritis   . Permanent atrial fibrillation   . Presence of permanent cardiac pacemaker cardiologist-  dr Samuel Bouche Jude status post generator replacement 2009 (original placement 02/ 1998)  . Renal artery stenosis (HCC)    95%  left, 50% right  . Requires assistance with activities of daily living (ADL)   . 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 Type 2 diabetes mellitus ([place])   . Urinary retention   . Uses wheelchair     Patient Active Problem List   Diagnosis Date Noted  . Systolic congestive heart failure ([place])   . Palliative care encounter   . Goals of care, counseling/discussion   . Aspiration pneumonia ([place]) 02/25/2018  . UTI (urinary tract infection) 02/25/2018  . Protein-calorie malnutrition, severe 12/18/2017  . Hypoglycemia due to type 2 diabetes mellitus ([place]) 12/17/2017  .  Confusion   . General weakness   . Dehydration 11/09/2017  . FTT (failure to thrive) in adult 11/09/2017  . AKI (acute kidney injury) ([place]) 11/09/2017  . Dementia ([place]) 11/09/2017  . Renal artery stenosis ([place])   . CKD (chronic kidney disease) stage 3, GFR 30-59 ml/min (HCC)   . Pacemaker-St. Jude 04/20/2011  . Permanent atrial fibrillation 05/03/2010  . AV BLOCK, COMPLETE 11/02/2009  . ABDOMINAL PAIN-EPIGASTRIC 10/27/2008  . AODM 07/25/2008  . Type 2 diabetes mellitus with hyperlipidemia ([place]) 07/25/2008  . Cardiomyopathy, ischemic 07/25/2008  . ATHEROSCLEROSIS OF RENAL ARTERY 07/25/2008  . HYPERTENSION, BENIGN 07/25/2008    Past Surgical History:  Procedure Laterality Date  .  suspicous skin lesions-nose and upper back-removed 04/15/18    . CARDIOVASCULAR STRESS TEST  05-22-2012   dr Caryl Comes   Low risk adenosine no exercise nuclear study w/ no ischemia/  ef 31%,  LV moderately enlarged , global hypokinesis and severe hypokinesis of the inferior wall  . CORONARY ANGIOPLASTY WITH STENT PLACEMENT  spring 1998   stenting  . CORONARY ARTERY BYPASS GRAFT  fall 1998   x4  . INSERTION OF SUPRAPUBIC CATHETER N/A 04/16/2018   Procedure: INSERTION OF SUPRAPUBIC CATHETER;  Surgeon: Irine Seal, MD;  Location: [facility] ORS;  Service: Urology;  Laterality: N/A;  . PACEMAKER GENERATOR CHANGE  10-30-2007  dr Caryl Comes  St Jude dual chamber  . PACEMAKER INSERTION  02/ 1998   dr Caryl Comes  . skin lesions    . TRANSTHORACIC ECHOCARDIOGRAM  11/10/2017   mild LVH, ef 15-20%, diffuse hypokinesis, LVDF not evaluated/  mild AS/  severe LAE/  RVSF severely reduced/  mild MR, PR, and TR/  PASP 84mmHg        Home Medications    Prior to Admission medications   Medication Sig Start Date End Date Taking? Authorizing Provider  albuterol (PROVENTIL) (2.5 MG/3ML) 0.083% nebulizer solution Take 3 mLs (2.5 mg total) by nebulization every 6 (six) hours as needed for wheezing or shortness of breath. Patient not taking:  Reported on 08/08/2018 02/27/18   Kathie Dike, MD  atorvastatin (LIPITOR) 40 MG tablet Take 40 mg by mouth daily at 12 noon.     [provider]  benzonatate (TESSALON) 100 MG capsule Take 100 mg by mouth 3 (three) times daily as needed for cough.    [provider]  ciprofloxacin (CIPRO) 500 MG tablet Take 1 tablet (500 mg total) by mouth every 12 (twelve) hours. 08/10/18   Terrilee Croak, MD  dabigatran (PRADAXA) 75 MG CAPS capsule Take 75 mg by mouth daily.    [provider]  feeding supplement, ENSURE ENLIVE, (ENSURE ENLIVE) LIQD Take 237 mLs by mouth 2 (two) times daily between meals. Patient taking differently: Take 237 mLs by mouth daily after breakfast.  12/18/17   Manuella Ghazi, Pratik D, DO  FEROSUL 325 (65 Fe) MG tablet Take 325 mg by mouth daily.  03/14/18   [provider]  furosemide (LASIX) 40 MG tablet Take 1 tablet (40 mg total) by mouth daily. 08/04/18   Varney Biles, MD  glipiZIDE (GLUCOTROL XL) 2.5 MG 24 hr tablet Take 2.5-5 mg by mouth as directed. Depends on blood sugar. Checks twice daily. 03/12/18   [provider]  lactobacillus acidophilus & bulgar (LACTINEX) chewable tablet Chew 2 tablets by mouth at bedtime. 08/10/18   Terrilee Croak, MD  levothyroxine (SYNTHROID, LEVOTHROID) 25 MCG tablet Take 25 mcg by mouth daily at 12 noon.  04/13/18   [provider]  Maltodextrin-Xanthan Gum (RESOURCE THICKENUP CLEAR) POWD Thicken fluids to nectar thick 02/27/18   Kathie Dike, MD  pantoprazole (PROTONIX) 40 MG tablet Take 40 mg by mouth daily at 12 noon.     [provider]  polyethylene glycol (MIRALAX) packet Take 17 g by mouth daily. Patient taking differently: Take 17 g by mouth daily as needed for moderate constipation.  03/03/18   Fransico Meadow, PA-C  Probiotic Product (PROBIOTIC-10 PO) Take 1 capsule by mouth at bedtime.     [provider]  QUEtiapine (SEROQUEL) 50 MG tablet Take 50 mg by mouth at bedtime.     [provider]  rOPINIRole (REQUIP) 0.5 MG tablet Take 1 mg by mouth at bedtime.     [provider]    Family History Family History  Problem Relation Age of Onset  . Other Mother   . Cancer Mother        breast  . Alzheimer's disease Sister     Social History Social History   Tobacco Use  . Smoking status: Never Smoker  . Smokeless tobacco: Never Used  Substance Use Topics  . Alcohol use: No  . Drug use: No     Allergies   Cephalexin and Penicillins   Review of Systems Review of Systems  All other systems reviewed and are negative.  Physical Exam Updated Vital Signs BP 109/65 (BP Location: Right Arm)   Pulse 65   Temp 97.7 F (36.5 C) (Oral)   Resp 18   SpO2 100%   Physical Exam Vitals signs and nursing note reviewed.  Constitutional:      General: He is not in acute distress.    Appearance: Normal appearance. He is well-developed. He is not toxic-appearing.  HENT:     Head: Normocephalic and atraumatic.  Eyes:     General: Lids are normal.     Conjunctiva/sclera: Conjunctivae normal.     Pupils: Pupils are equal, round, and reactive to light.  Neck:     Musculoskeletal: Normal range of motion and neck supple.     Thyroid: No thyroid mass.     Trachea: No tracheal deviation.  Cardiovascular:     Rate and Rhythm: Normal rate and regular rhythm.     Heart sounds: Normal heart sounds. No murmur. No gallop.   Pulmonary:     Effort: Pulmonary effort is normal. No respiratory distress.     Breath sounds: Normal breath sounds. No stridor. No decreased breath sounds, wheezing, rhonchi or rales.  Abdominal:     General: Bowel sounds are normal. There is no distension.     Palpations: Abdomen is soft.     Tenderness: There is no abdominal tenderness. There is no rebound.    Musculoskeletal: Normal range of motion.        General: No tenderness.  Skin:    General: Skin is warm and dry.     Findings: No abrasion or rash.    Neurological:     Mental Status: Mental status is at baseline.     GCS: GCS eye subscore is 4. GCS verbal subscore is 3. GCS motor subscore is 5.     Sensory: No sensory deficit.      ED Treatments / Results  Labs (all labs ordered are listed, but only abnormal results are displayed) Labs Reviewed  URINE CULTURE  CBC WITH DIFFERENTIAL/PLATELET  BASIC METABOLIC PANEL  URINALYSIS, ROUTINE W REFLEX MICROSCOPIC    EKG None  Radiology No results found.  Procedures Procedures (including critical care time)  Medications Ordered in ED Medications - No data to display   Initial Impression / Assessment and Plan / ED Course  I have reviewed the triage vital signs and the nursing notes.  Pertinent labs & imaging results that were available during my care of the patient were reviewed by me and considered in my medical decision making (see chart for details).     Patient has been on ciprofloxacin but was placed on Levaquin as he does have evidence of infection.  His prior urine was sensitive to Levaquin which is a reason why.  He does take Pradaxa and patient will continue this and will follow-up with Dr. Jeffie Pollock on Monday.  Final Clinical Impressions(s) / ED Diagnoses   Final diagnoses:  None    ED Discharge Orders    None       Lacretia Leigh, MD 08/17/18 1531

## 2018-08-19 DIAGNOSIS — N3020 Other chronic cystitis without hematuria: Secondary | ICD-10-CM | POA: Diagnosis not present

## 2018-08-19 DIAGNOSIS — R310 Gross hematuria: Secondary | ICD-10-CM | POA: Diagnosis not present

## 2018-08-19 DIAGNOSIS — I700 Atherosclerosis of aorta: Secondary | ICD-10-CM | POA: Diagnosis not present

## 2018-08-19 DIAGNOSIS — Z681 Body mass index (BMI) 19 or less, adult: Secondary | ICD-10-CM | POA: Diagnosis not present

## 2018-08-19 LAB — URINE CULTURE: Culture: <10000 — AB

## 2018-08-20 DIAGNOSIS — I255 Ischemic cardiomyopathy: Secondary | ICD-10-CM | POA: Diagnosis not present

## 2018-08-20 DIAGNOSIS — I130 Hypertensive heart and chronic kidney disease with heart failure and stage 1 through stage 4 chronic kidney disease, or unspecified chronic kidney disease: Secondary | ICD-10-CM | POA: Diagnosis not present

## 2018-08-20 DIAGNOSIS — I5022 Chronic systolic (congestive) heart failure: Secondary | ICD-10-CM | POA: Diagnosis not present

## 2018-08-20 DIAGNOSIS — F0390 Unspecified dementia without behavioral disturbance: Secondary | ICD-10-CM | POA: Diagnosis not present

## 2018-08-20 DIAGNOSIS — I2510 Atherosclerotic heart disease of native coronary artery without angina pectoris: Secondary | ICD-10-CM | POA: Diagnosis not present

## 2018-08-20 DIAGNOSIS — E1122 Type 2 diabetes mellitus with diabetic chronic kidney disease: Secondary | ICD-10-CM | POA: Diagnosis not present

## 2018-08-21 DIAGNOSIS — R310 Gross hematuria: Secondary | ICD-10-CM | POA: Diagnosis not present

## 2018-08-21 DIAGNOSIS — N312 Flaccid neuropathic bladder, not elsewhere classified: Secondary | ICD-10-CM | POA: Diagnosis not present

## 2018-08-22 DIAGNOSIS — I5022 Chronic systolic (congestive) heart failure: Secondary | ICD-10-CM | POA: Diagnosis not present

## 2018-08-22 DIAGNOSIS — I255 Ischemic cardiomyopathy: Secondary | ICD-10-CM | POA: Diagnosis not present

## 2018-08-22 DIAGNOSIS — I2510 Atherosclerotic heart disease of native coronary artery without angina pectoris: Secondary | ICD-10-CM | POA: Diagnosis not present

## 2018-08-22 DIAGNOSIS — E1122 Type 2 diabetes mellitus with diabetic chronic kidney disease: Secondary | ICD-10-CM | POA: Diagnosis not present

## 2018-08-22 DIAGNOSIS — F0390 Unspecified dementia without behavioral disturbance: Secondary | ICD-10-CM | POA: Diagnosis not present

## 2018-08-22 DIAGNOSIS — I130 Hypertensive heart and chronic kidney disease with heart failure and stage 1 through stage 4 chronic kidney disease, or unspecified chronic kidney disease: Secondary | ICD-10-CM | POA: Diagnosis not present

## 2018-08-26 DIAGNOSIS — F0390 Unspecified dementia without behavioral disturbance: Secondary | ICD-10-CM | POA: Diagnosis not present

## 2018-08-26 DIAGNOSIS — I255 Ischemic cardiomyopathy: Secondary | ICD-10-CM | POA: Diagnosis not present

## 2018-08-26 DIAGNOSIS — I2510 Atherosclerotic heart disease of native coronary artery without angina pectoris: Secondary | ICD-10-CM | POA: Diagnosis not present

## 2018-08-26 DIAGNOSIS — E1122 Type 2 diabetes mellitus with diabetic chronic kidney disease: Secondary | ICD-10-CM | POA: Diagnosis not present

## 2018-08-26 DIAGNOSIS — I130 Hypertensive heart and chronic kidney disease with heart failure and stage 1 through stage 4 chronic kidney disease, or unspecified chronic kidney disease: Secondary | ICD-10-CM | POA: Diagnosis not present

## 2018-08-26 DIAGNOSIS — I5022 Chronic systolic (congestive) heart failure: Secondary | ICD-10-CM | POA: Diagnosis not present

## 2018-08-27 DIAGNOSIS — F0390 Unspecified dementia without behavioral disturbance: Secondary | ICD-10-CM | POA: Diagnosis not present

## 2018-08-27 DIAGNOSIS — N183 Chronic kidney disease, stage 3 (moderate): Secondary | ICD-10-CM | POA: Diagnosis not present

## 2018-08-27 DIAGNOSIS — I442 Atrioventricular block, complete: Secondary | ICD-10-CM | POA: Diagnosis not present

## 2018-08-27 DIAGNOSIS — Z466 Encounter for fitting and adjustment of urinary device: Secondary | ICD-10-CM | POA: Diagnosis not present

## 2018-08-27 DIAGNOSIS — Z950 Presence of cardiac pacemaker: Secondary | ICD-10-CM | POA: Diagnosis not present

## 2018-08-27 DIAGNOSIS — Z87440 Personal history of urinary (tract) infections: Secondary | ICD-10-CM | POA: Diagnosis not present

## 2018-08-27 DIAGNOSIS — I4821 Permanent atrial fibrillation: Secondary | ICD-10-CM | POA: Diagnosis not present

## 2018-08-27 DIAGNOSIS — R627 Adult failure to thrive: Secondary | ICD-10-CM | POA: Diagnosis not present

## 2018-08-27 DIAGNOSIS — E1122 Type 2 diabetes mellitus with diabetic chronic kidney disease: Secondary | ICD-10-CM | POA: Diagnosis not present

## 2018-08-27 DIAGNOSIS — I130 Hypertensive heart and chronic kidney disease with heart failure and stage 1 through stage 4 chronic kidney disease, or unspecified chronic kidney disease: Secondary | ICD-10-CM | POA: Diagnosis not present

## 2018-08-27 DIAGNOSIS — I701 Atherosclerosis of renal artery: Secondary | ICD-10-CM | POA: Diagnosis not present

## 2018-08-27 DIAGNOSIS — D631 Anemia in chronic kidney disease: Secondary | ICD-10-CM | POA: Diagnosis not present

## 2018-08-27 DIAGNOSIS — I5022 Chronic systolic (congestive) heart failure: Secondary | ICD-10-CM | POA: Diagnosis not present

## 2018-08-27 DIAGNOSIS — I255 Ischemic cardiomyopathy: Secondary | ICD-10-CM | POA: Diagnosis not present

## 2018-08-27 DIAGNOSIS — I2510 Atherosclerotic heart disease of native coronary artery without angina pectoris: Secondary | ICD-10-CM | POA: Diagnosis not present

## 2018-08-27 DIAGNOSIS — Z951 Presence of aortocoronary bypass graft: Secondary | ICD-10-CM | POA: Diagnosis not present

## 2018-08-28 DIAGNOSIS — F0390 Unspecified dementia without behavioral disturbance: Secondary | ICD-10-CM | POA: Diagnosis not present

## 2018-08-28 DIAGNOSIS — I255 Ischemic cardiomyopathy: Secondary | ICD-10-CM | POA: Diagnosis not present

## 2018-08-28 DIAGNOSIS — I5022 Chronic systolic (congestive) heart failure: Secondary | ICD-10-CM | POA: Diagnosis not present

## 2018-08-28 DIAGNOSIS — I2510 Atherosclerotic heart disease of native coronary artery without angina pectoris: Secondary | ICD-10-CM | POA: Diagnosis not present

## 2018-08-28 DIAGNOSIS — E1122 Type 2 diabetes mellitus with diabetic chronic kidney disease: Secondary | ICD-10-CM | POA: Diagnosis not present

## 2018-08-28 DIAGNOSIS — I130 Hypertensive heart and chronic kidney disease with heart failure and stage 1 through stage 4 chronic kidney disease, or unspecified chronic kidney disease: Secondary | ICD-10-CM | POA: Diagnosis not present

## 2018-08-29 ENCOUNTER — Emergency Department (HOSPITAL_COMMUNITY)
Admission: EM | Admit: 2018-08-29 | Discharge: 2018-08-29 | Disposition: A | Discharge status: Home / Self Care | Payer: Medicare Other | Attending: Emergency Medicine | Admitting: Emergency Medicine

## 2018-08-29 ENCOUNTER — Encounter (HOSPITAL_COMMUNITY): Payer: Self-pay | Admitting: Emergency Medicine

## 2018-08-29 DIAGNOSIS — R319 Hematuria, unspecified: Secondary | ICD-10-CM | POA: Insufficient documentation

## 2018-08-29 DIAGNOSIS — I130 Hypertensive heart and chronic kidney disease with heart failure and stage 1 through stage 4 chronic kidney disease, or unspecified chronic kidney disease: Secondary | ICD-10-CM | POA: Diagnosis not present

## 2018-08-29 DIAGNOSIS — E119 Type 2 diabetes mellitus without complications: Secondary | ICD-10-CM | POA: Insufficient documentation

## 2018-08-29 DIAGNOSIS — N183 Chronic kidney disease, stage 3 (moderate): Secondary | ICD-10-CM | POA: Diagnosis not present

## 2018-08-29 DIAGNOSIS — I5022 Chronic systolic (congestive) heart failure: Secondary | ICD-10-CM | POA: Diagnosis not present

## 2018-08-29 LAB — URINALYSIS, ROUTINE W REFLEX MICROSCOPIC
Bilirubin Urine: NEGATIVE
Glucose, UA: NEGATIVE mg/dL
Ketones, ur: NEGATIVE mg/dL
Nitrite: NEGATIVE
Protein, ur: 30 mg/dL — AB
RBC / HPF: >50 RBC/hpf — ABNORMAL HIGH (ref 0–5)
Specific Gravity, Urine: 1.016 (ref 1.005–1.030)
pH: 5 (ref 5.0–8.0)

## 2018-08-29 NOTE — ED Triage Notes (Signed)
Pt to ER accompanied by family for blood noted in suprapubic catheter tubing. Reports this has occurred many times over the last month with one hospitalization at [facility] for UTI. Patient has no complaints at this time. In NAD.

## 2018-08-29 NOTE — Discharge Instructions (Addendum)
You were evaluated in the Emergency Department and after careful evaluation, we did not find any emergent condition requiring admission or further testing in the hospital.  Your urine sample did not show any clear signs of infection.  We mostly found blood in the urine, which we can likely contribute to the patient's use of blood thinners and some irritation.  As discussed, we will send the urine for culture and you will be called with any abnormal results.  Please continue to follow-up with your primary care provider and urologist.  Please return to the Emergency Department if you experience any worsening of your condition.  We encourage you to follow up with a primary care provider.  Thank you for allowing [place] to be a part of your care.

## 2018-08-29 NOTE — ED Provider Notes (Signed)
[facility] Emergency Department Provider Note MRN:  [id number]  Arrival date & time: 08/29/18     Chief Complaint   Hematuria   History of Present Illness   Neil [name] is a 85 y.o. year-old male with a history of CHF, CKD, diabetes, frequent UTI presenting to the ED with chief complaint of hematuria.  2 days of painless hematuria, which is happened multiple times in the past.  Patient is on Pradaxa.  Patient and family deny recent behavioral change, no increased confusion, no fever, no headache, no vision change, no chest pain, no shortness of breath, no abdominal pain, no trauma.  No numbness or weakness to the arms or legs.  Family concerned that he has a UTI every time he has blood in the urine  Review of Systems  A complete 10 system review of systems was obtained and all systems are negative except as noted in the HPI and PMH.   Patient's Health History    Past Medical History:  Diagnosis Date  . Chronic kidney disease (CKD), stage III (moderate) (HCC)   . Chronic systolic CHF (congestive heart failure) ([place])   . Complete heart block ([place])   . Coronary artery disease    status post [id number]  . Dementia ([place])   . Dyslipidemia   . Foley catheter in place   . Hypertension   . Ischemic cardiomyopathy    per last echo 11-10-2017  ef 15-20%  . Osteoarthritis   . Permanent atrial fibrillation   . Presence of permanent cardiac pacemaker cardiologist-  dr Samuel Bouche Jude status post generator replacement 2009 (original placement 02/ 1998)  . Renal artery stenosis (HCC)    95%  left, 50% right  . Requires assistance with activities of daily living (ADL)   . 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 Type 2 diabetes mellitus ([place])   . Urinary retention   . Uses wheelchair     Past Surgical History:  Procedure Laterality Date  .  suspicous skin lesions-nose and upper back-removed 04/15/18    . CARDIOVASCULAR STRESS TEST  05-22-2012   dr Caryl Comes   Low risk adenosine no  exercise nuclear study w/ no ischemia/  ef 31%,  LV moderately enlarged , global hypokinesis and severe hypokinesis of the inferior wall  . CORONARY ANGIOPLASTY WITH STENT PLACEMENT  spring 1998   stenting  . CORONARY ARTERY BYPASS GRAFT  fall 1998   x4  . INSERTION OF SUPRAPUBIC CATHETER N/A 04/16/2018   Procedure: INSERTION OF SUPRAPUBIC CATHETER;  Surgeon: Irine Seal, MD;  Location: [facility];  Service: Urology;  Laterality: N/A;  . PACEMAKER GENERATOR CHANGE  10-30-2007  dr Samuel Bouche Jude dual chamber  . PACEMAKER INSERTION  02/ 1998   dr Caryl Comes  . skin lesions    . TRANSTHORACIC ECHOCARDIOGRAM  11/10/2017   mild LVH, ef 15-20%, diffuse hypokinesis, LVDF not evaluated/  mild AS/  severe LAE/  RVSF severely reduced/  mild MR, PR, and TR/  PASP 22mmHg    Family History  Problem Relation Age of Onset  . Other Mother   . Cancer Mother        breast  . Alzheimer's disease Sister     Social History   Socioeconomic History  . Marital status: Married    Spouse name: [name]  . Number of children: Not on file  . Years of education: Not on file  . Highest education level: Not on file  Occupational History  . Not on file  Social Needs  . Financial resource strain: Not hard at all  . Food insecurity:    Worry: Never true    Inability: Never true  . Transportation needs:    Medical: No    Non-medical: No  Tobacco Use  . Smoking status: Never Smoker  . Smokeless tobacco: Never Used  Substance and Sexual Activity  . Alcohol use: No  . Drug use: No  . Sexual activity: Not Currently  Lifestyle  . Physical activity:    Days per week: 0 days    Minutes per session: 0 min  . Stress: Only a little  Relationships  . Social connections:    Talks on phone: Once a week    Gets together: More than three times a week    Attends religious service: 1 to 4 times per year    Active member of club or organization: No    Attends meetings of clubs or organizations: Never    Relationship  status: Married  . Intimate partner violence:    Fear of current or ex partner: No    Emotionally abused: No    Physically abused: No    Forced sexual activity: No  Other Topics Concern  . Not on file  Social History Narrative  . Not on file     Physical Exam  Vital Signs and Nursing Notes reviewed Vitals:   08/29/18 1430  BP: 112/65  Pulse: 77  Resp: 19  SpO2: 100%    CONSTITUTIONAL: Chronically ill-appearing, NAD NEURO: Awake, oriented to name only, moving all extremities EYES:  eyes equal and reactive ENT/NECK:  no LAD, no JVD CARDIO: Regular rate, well-perfused, normal S1 and S2 PULM:  CTAB no wheezing or rhonchi GI/GU:  normal bowel sounds, non-distended, non-tender; suprapubic catheter in place MSK/SPINE:  No gross deformities, no edema SKIN:  no rash, atraumatic PSYCH:  Appropriate speech and behavior  Diagnostic and Interventional Summary    Labs Reviewed  URINALYSIS, ROUTINE W REFLEX MICROSCOPIC - Abnormal; Notable for the following components:      Result Value   Color, Urine AMBER (*)    APPearance HAZY (*)    Hgb urine dipstick LARGE (*)    Protein, ur 30 (*)    Leukocytes,Ua MODERATE (*)    RBC / HPF >50 (*)    Bacteria, UA RARE (*)    All other components within normal limits  URINE CULTURE    No orders to display    Medications - No data to display   Procedures Critical Care  ED Course and Medical Decision Making  I have reviewed the triage vital signs and the nursing notes.  Pertinent labs & imaging results that were available during my care of the patient were reviewed by me and considered in my medical decision making (see below for details).  85 year old male anticoagulated here with recurrent hematuria.  Patient has no other symptoms, nothing to suggest systemic infection, no chest pain, no shortness of breath, no indication for laboratory evaluation or imaging at this time.  Education provided to family, patient's urine cultures have  largely been either with no growth or more likely to represent colonization.  In late January had one instance where he grew out Enterococcus faecalis which was treated.  More likely to be irritation causing bleeding in the setting of anticoagulation.  Will send for urinalysis with culture, if there is evident sign of infection such as nitrate positive, leukoesterase positive, many  white blood cells, many bacteria, will consider treating empirically.  If equivocal, would favor following up urine culture.  Patient and family agreeable with this plan, agreeable with discharge.  Urine sample reveals negative nitrites, moderate leuks, small amount of white blood cells, no bacteria, again seems more consistent with colonization or isolated hematuria in the setting of anticoagulation, no indication for antibiotics at this time, will follow-up culture.  Discussed pros and cons of continuing anticoagulation with family, we decided to continue Pradaxa as described until he can follow-up with his primary care doctor, as his multiple comorbidities suggest that the benefit of the Pradaxa currently outweighs the risks of mild hematuria.  After the discussed management above, the patient was determined to be safe for discharge.  The patient was in agreement with this plan and all questions regarding their care were answered.  ED return precautions were discussed and the patient will return to the ED with any significant worsening of condition.  Barth Kirks. Sedonia Small, [place] [email or web address]  Final Clinical Impressions(s) / ED Diagnoses     ICD-10-CM   1. Hematuria, unspecified type R31.9     ED Discharge Orders    None         Maudie Flakes, MD 08/29/18 [phone]

## 2018-08-30 DIAGNOSIS — R319 Hematuria, unspecified: Secondary | ICD-10-CM | POA: Diagnosis not present

## 2018-08-30 DIAGNOSIS — F0390 Unspecified dementia without behavioral disturbance: Secondary | ICD-10-CM | POA: Diagnosis not present

## 2018-08-30 DIAGNOSIS — I10 Essential (primary) hypertension: Secondary | ICD-10-CM | POA: Diagnosis not present

## 2018-08-30 DIAGNOSIS — I130 Hypertensive heart and chronic kidney disease with heart failure and stage 1 through stage 4 chronic kidney disease, or unspecified chronic kidney disease: Secondary | ICD-10-CM | POA: Diagnosis not present

## 2018-08-30 DIAGNOSIS — N401 Enlarged prostate with lower urinary tract symptoms: Secondary | ICD-10-CM | POA: Diagnosis not present

## 2018-08-30 DIAGNOSIS — I4891 Unspecified atrial fibrillation: Secondary | ICD-10-CM | POA: Diagnosis not present

## 2018-08-30 DIAGNOSIS — E1129 Type 2 diabetes mellitus with other diabetic kidney complication: Secondary | ICD-10-CM | POA: Diagnosis not present

## 2018-08-30 DIAGNOSIS — I5022 Chronic systolic (congestive) heart failure: Secondary | ICD-10-CM | POA: Diagnosis not present

## 2018-08-30 DIAGNOSIS — E1122 Type 2 diabetes mellitus with diabetic chronic kidney disease: Secondary | ICD-10-CM | POA: Diagnosis not present

## 2018-08-30 DIAGNOSIS — I255 Ischemic cardiomyopathy: Secondary | ICD-10-CM | POA: Diagnosis not present

## 2018-08-30 DIAGNOSIS — I2510 Atherosclerotic heart disease of native coronary artery without angina pectoris: Secondary | ICD-10-CM | POA: Diagnosis not present

## 2018-08-30 LAB — URINE CULTURE: Culture: >=100000 — AB

## 2018-09-03 DIAGNOSIS — I255 Ischemic cardiomyopathy: Secondary | ICD-10-CM | POA: Diagnosis not present

## 2018-09-03 DIAGNOSIS — I5022 Chronic systolic (congestive) heart failure: Secondary | ICD-10-CM | POA: Diagnosis not present

## 2018-09-03 DIAGNOSIS — I130 Hypertensive heart and chronic kidney disease with heart failure and stage 1 through stage 4 chronic kidney disease, or unspecified chronic kidney disease: Secondary | ICD-10-CM | POA: Diagnosis not present

## 2018-09-03 DIAGNOSIS — F0390 Unspecified dementia without behavioral disturbance: Secondary | ICD-10-CM | POA: Diagnosis not present

## 2018-09-03 DIAGNOSIS — E1122 Type 2 diabetes mellitus with diabetic chronic kidney disease: Secondary | ICD-10-CM | POA: Diagnosis not present

## 2018-09-03 DIAGNOSIS — I2510 Atherosclerotic heart disease of native coronary artery without angina pectoris: Secondary | ICD-10-CM | POA: Diagnosis not present

## 2018-09-04 DIAGNOSIS — I5022 Chronic systolic (congestive) heart failure: Secondary | ICD-10-CM | POA: Diagnosis not present

## 2018-09-04 DIAGNOSIS — E1122 Type 2 diabetes mellitus with diabetic chronic kidney disease: Secondary | ICD-10-CM | POA: Diagnosis not present

## 2018-09-04 DIAGNOSIS — I255 Ischemic cardiomyopathy: Secondary | ICD-10-CM | POA: Diagnosis not present

## 2018-09-04 DIAGNOSIS — F0390 Unspecified dementia without behavioral disturbance: Secondary | ICD-10-CM | POA: Diagnosis not present

## 2018-09-04 DIAGNOSIS — I130 Hypertensive heart and chronic kidney disease with heart failure and stage 1 through stage 4 chronic kidney disease, or unspecified chronic kidney disease: Secondary | ICD-10-CM | POA: Diagnosis not present

## 2018-09-04 DIAGNOSIS — I2510 Atherosclerotic heart disease of native coronary artery without angina pectoris: Secondary | ICD-10-CM | POA: Diagnosis not present

## 2018-09-05 DIAGNOSIS — E1122 Type 2 diabetes mellitus with diabetic chronic kidney disease: Secondary | ICD-10-CM | POA: Diagnosis not present

## 2018-09-05 DIAGNOSIS — I130 Hypertensive heart and chronic kidney disease with heart failure and stage 1 through stage 4 chronic kidney disease, or unspecified chronic kidney disease: Secondary | ICD-10-CM | POA: Diagnosis not present

## 2018-09-05 DIAGNOSIS — F0390 Unspecified dementia without behavioral disturbance: Secondary | ICD-10-CM | POA: Diagnosis not present

## 2018-09-05 DIAGNOSIS — I255 Ischemic cardiomyopathy: Secondary | ICD-10-CM | POA: Diagnosis not present

## 2018-09-05 DIAGNOSIS — I5022 Chronic systolic (congestive) heart failure: Secondary | ICD-10-CM | POA: Diagnosis not present

## 2018-09-05 DIAGNOSIS — I2510 Atherosclerotic heart disease of native coronary artery without angina pectoris: Secondary | ICD-10-CM | POA: Diagnosis not present

## 2018-09-06 ENCOUNTER — Other Ambulatory Visit: Payer: Self-pay

## 2018-09-06 NOTE — Patient Outreach (Deleted)
[facility]) Care Management  09/06/2018  Marcy Salvo 1928/12/01 [id number]

## 2018-09-11 DIAGNOSIS — I255 Ischemic cardiomyopathy: Secondary | ICD-10-CM | POA: Diagnosis not present

## 2018-09-11 DIAGNOSIS — I2510 Atherosclerotic heart disease of native coronary artery without angina pectoris: Secondary | ICD-10-CM | POA: Diagnosis not present

## 2018-09-11 DIAGNOSIS — I130 Hypertensive heart and chronic kidney disease with heart failure and stage 1 through stage 4 chronic kidney disease, or unspecified chronic kidney disease: Secondary | ICD-10-CM | POA: Diagnosis not present

## 2018-09-11 DIAGNOSIS — F0390 Unspecified dementia without behavioral disturbance: Secondary | ICD-10-CM | POA: Diagnosis not present

## 2018-09-11 DIAGNOSIS — E1122 Type 2 diabetes mellitus with diabetic chronic kidney disease: Secondary | ICD-10-CM | POA: Diagnosis not present

## 2018-09-11 DIAGNOSIS — I5022 Chronic systolic (congestive) heart failure: Secondary | ICD-10-CM | POA: Diagnosis not present

## 2018-09-12 DIAGNOSIS — I130 Hypertensive heart and chronic kidney disease with heart failure and stage 1 through stage 4 chronic kidney disease, or unspecified chronic kidney disease: Secondary | ICD-10-CM | POA: Diagnosis not present

## 2018-09-12 DIAGNOSIS — I255 Ischemic cardiomyopathy: Secondary | ICD-10-CM | POA: Diagnosis not present

## 2018-09-12 DIAGNOSIS — I5022 Chronic systolic (congestive) heart failure: Secondary | ICD-10-CM | POA: Diagnosis not present

## 2018-09-12 DIAGNOSIS — I2510 Atherosclerotic heart disease of native coronary artery without angina pectoris: Secondary | ICD-10-CM | POA: Diagnosis not present

## 2018-09-16 DIAGNOSIS — I255 Ischemic cardiomyopathy: Secondary | ICD-10-CM | POA: Diagnosis not present

## 2018-09-16 DIAGNOSIS — I5022 Chronic systolic (congestive) heart failure: Secondary | ICD-10-CM | POA: Diagnosis not present

## 2018-09-16 DIAGNOSIS — I130 Hypertensive heart and chronic kidney disease with heart failure and stage 1 through stage 4 chronic kidney disease, or unspecified chronic kidney disease: Secondary | ICD-10-CM | POA: Diagnosis not present

## 2018-09-16 DIAGNOSIS — F0390 Unspecified dementia without behavioral disturbance: Secondary | ICD-10-CM | POA: Diagnosis not present

## 2018-09-16 DIAGNOSIS — I2510 Atherosclerotic heart disease of native coronary artery without angina pectoris: Secondary | ICD-10-CM | POA: Diagnosis not present

## 2018-09-16 DIAGNOSIS — E1122 Type 2 diabetes mellitus with diabetic chronic kidney disease: Secondary | ICD-10-CM | POA: Diagnosis not present

## 2018-09-18 DIAGNOSIS — I130 Hypertensive heart and chronic kidney disease with heart failure and stage 1 through stage 4 chronic kidney disease, or unspecified chronic kidney disease: Secondary | ICD-10-CM | POA: Diagnosis not present

## 2018-09-18 DIAGNOSIS — I255 Ischemic cardiomyopathy: Secondary | ICD-10-CM | POA: Diagnosis not present

## 2018-09-18 DIAGNOSIS — I5022 Chronic systolic (congestive) heart failure: Secondary | ICD-10-CM | POA: Diagnosis not present

## 2018-09-18 DIAGNOSIS — I2510 Atherosclerotic heart disease of native coronary artery without angina pectoris: Secondary | ICD-10-CM | POA: Diagnosis not present

## 2018-09-18 DIAGNOSIS — E1122 Type 2 diabetes mellitus with diabetic chronic kidney disease: Secondary | ICD-10-CM | POA: Diagnosis not present

## 2018-09-18 DIAGNOSIS — F0390 Unspecified dementia without behavioral disturbance: Secondary | ICD-10-CM | POA: Diagnosis not present

## 2018-09-20 ENCOUNTER — Inpatient Hospital Stay (HOSPITAL_COMMUNITY)
Admission: EM | Admit: 2018-09-20 | Discharge: 2018-09-28 | DRG: 193 | Disposition: A | Discharge status: Home Health | Payer: Medicare Other | Attending: Family Medicine | Admitting: Family Medicine

## 2018-09-20 ENCOUNTER — Other Ambulatory Visit: Payer: Self-pay

## 2018-09-20 ENCOUNTER — Emergency Department (HOSPITAL_COMMUNITY): Payer: Medicare Other

## 2018-09-20 ENCOUNTER — Encounter (HOSPITAL_COMMUNITY): Payer: Self-pay | Admitting: Emergency Medicine

## 2018-09-20 DIAGNOSIS — N390 Urinary tract infection, site not specified: Secondary | ICD-10-CM | POA: Diagnosis present

## 2018-09-20 DIAGNOSIS — E876 Hypokalemia: Secondary | ICD-10-CM | POA: Diagnosis present

## 2018-09-20 DIAGNOSIS — N2581 Secondary hyperparathyroidism of renal origin: Secondary | ICD-10-CM | POA: Diagnosis present

## 2018-09-20 DIAGNOSIS — I5022 Chronic systolic (congestive) heart failure: Secondary | ICD-10-CM | POA: Diagnosis not present

## 2018-09-20 DIAGNOSIS — N179 Acute kidney failure, unspecified: Secondary | ICD-10-CM | POA: Diagnosis not present

## 2018-09-20 DIAGNOSIS — Z951 Presence of aortocoronary bypass graft: Secondary | ICD-10-CM | POA: Diagnosis not present

## 2018-09-20 DIAGNOSIS — Z87440 Personal history of urinary (tract) infections: Secondary | ICD-10-CM

## 2018-09-20 DIAGNOSIS — B9620 Unspecified Escherichia coli [E. coli] as the cause of diseases classified elsewhere: Secondary | ICD-10-CM | POA: Diagnosis present

## 2018-09-20 DIAGNOSIS — T83510D Infection and inflammatory reaction due to cystostomy catheter, subsequent encounter: Secondary | ICD-10-CM | POA: Diagnosis not present

## 2018-09-20 DIAGNOSIS — R509 Fever, unspecified: Secondary | ICD-10-CM | POA: Diagnosis not present

## 2018-09-20 DIAGNOSIS — N170 Acute kidney failure with tubular necrosis: Secondary | ICD-10-CM | POA: Diagnosis not present

## 2018-09-20 DIAGNOSIS — Z7901 Long term (current) use of anticoagulants: Secondary | ICD-10-CM

## 2018-09-20 DIAGNOSIS — D649 Anemia, unspecified: Secondary | ICD-10-CM | POA: Diagnosis not present

## 2018-09-20 DIAGNOSIS — R918 Other nonspecific abnormal finding of lung field: Secondary | ICD-10-CM | POA: Diagnosis not present

## 2018-09-20 DIAGNOSIS — I701 Atherosclerosis of renal artery: Secondary | ICD-10-CM | POA: Diagnosis not present

## 2018-09-20 DIAGNOSIS — I130 Hypertensive heart and chronic kidney disease with heart failure and stage 1 through stage 4 chronic kidney disease, or unspecified chronic kidney disease: Secondary | ICD-10-CM | POA: Diagnosis present

## 2018-09-20 DIAGNOSIS — Z993 Dependence on wheelchair: Secondary | ICD-10-CM

## 2018-09-20 DIAGNOSIS — R627 Adult failure to thrive: Secondary | ICD-10-CM | POA: Diagnosis not present

## 2018-09-20 DIAGNOSIS — R1310 Dysphagia, unspecified: Secondary | ICD-10-CM | POA: Diagnosis not present

## 2018-09-20 DIAGNOSIS — D631 Anemia in chronic kidney disease: Secondary | ICD-10-CM | POA: Diagnosis present

## 2018-09-20 DIAGNOSIS — Z466 Encounter for fitting and adjustment of urinary device: Secondary | ICD-10-CM | POA: Diagnosis not present

## 2018-09-20 DIAGNOSIS — E1169 Type 2 diabetes mellitus with other specified complication: Secondary | ICD-10-CM | POA: Diagnosis not present

## 2018-09-20 DIAGNOSIS — J101 Influenza due to other identified influenza virus with other respiratory manifestations: Secondary | ICD-10-CM | POA: Diagnosis present

## 2018-09-20 DIAGNOSIS — R339 Retention of urine, unspecified: Secondary | ICD-10-CM | POA: Diagnosis not present

## 2018-09-20 DIAGNOSIS — F0390 Unspecified dementia without behavioral disturbance: Secondary | ICD-10-CM | POA: Diagnosis present

## 2018-09-20 DIAGNOSIS — I2510 Atherosclerotic heart disease of native coronary artery without angina pectoris: Secondary | ICD-10-CM | POA: Diagnosis present

## 2018-09-20 DIAGNOSIS — J90 Pleural effusion, not elsewhere classified: Secondary | ICD-10-CM | POA: Diagnosis not present

## 2018-09-20 DIAGNOSIS — R0600 Dyspnea, unspecified: Secondary | ICD-10-CM

## 2018-09-20 DIAGNOSIS — I5043 Acute on chronic combined systolic (congestive) and diastolic (congestive) heart failure: Secondary | ICD-10-CM | POA: Diagnosis present

## 2018-09-20 DIAGNOSIS — T83518A Infection and inflammatory reaction due to other urinary catheter, initial encounter: Secondary | ICD-10-CM | POA: Diagnosis not present

## 2018-09-20 DIAGNOSIS — E785 Hyperlipidemia, unspecified: Secondary | ICD-10-CM | POA: Diagnosis present

## 2018-09-20 DIAGNOSIS — R05 Cough: Secondary | ICD-10-CM | POA: Diagnosis not present

## 2018-09-20 DIAGNOSIS — Z7984 Long term (current) use of oral hypoglycemic drugs: Secondary | ICD-10-CM

## 2018-09-20 DIAGNOSIS — N281 Cyst of kidney, acquired: Secondary | ICD-10-CM | POA: Diagnosis not present

## 2018-09-20 DIAGNOSIS — F05 Delirium due to known physiological condition: Secondary | ICD-10-CM | POA: Diagnosis not present

## 2018-09-20 DIAGNOSIS — I255 Ischemic cardiomyopathy: Secondary | ICD-10-CM | POA: Diagnosis present

## 2018-09-20 DIAGNOSIS — Z950 Presence of cardiac pacemaker: Secondary | ICD-10-CM | POA: Diagnosis not present

## 2018-09-20 DIAGNOSIS — E1122 Type 2 diabetes mellitus with diabetic chronic kidney disease: Secondary | ICD-10-CM | POA: Diagnosis present

## 2018-09-20 DIAGNOSIS — Y846 Urinary catheterization as the cause of abnormal reaction of the patient, or of later complication, without mention of misadventure at the time of the procedure: Secondary | ICD-10-CM | POA: Diagnosis present

## 2018-09-20 DIAGNOSIS — T17908A Unspecified foreign body in respiratory tract, part unspecified causing other injury, initial encounter: Secondary | ICD-10-CM

## 2018-09-20 DIAGNOSIS — I4821 Permanent atrial fibrillation: Secondary | ICD-10-CM | POA: Diagnosis not present

## 2018-09-20 DIAGNOSIS — Z79890 Hormone replacement therapy (postmenopausal): Secondary | ICD-10-CM

## 2018-09-20 DIAGNOSIS — I10 Essential (primary) hypertension: Secondary | ICD-10-CM | POA: Diagnosis present

## 2018-09-20 DIAGNOSIS — N183 Chronic kidney disease, stage 3 unspecified: Secondary | ICD-10-CM | POA: Diagnosis present

## 2018-09-20 DIAGNOSIS — I442 Atrioventricular block, complete: Secondary | ICD-10-CM | POA: Diagnosis present

## 2018-09-20 DIAGNOSIS — Z79899 Other long term (current) drug therapy: Secondary | ICD-10-CM

## 2018-09-20 DIAGNOSIS — R0602 Shortness of breath: Secondary | ICD-10-CM

## 2018-09-20 DIAGNOSIS — E039 Hypothyroidism, unspecified: Secondary | ICD-10-CM | POA: Diagnosis present

## 2018-09-20 DIAGNOSIS — R059 Cough, unspecified: Secondary | ICD-10-CM

## 2018-09-20 DIAGNOSIS — I5021 Acute systolic (congestive) heart failure: Secondary | ICD-10-CM | POA: Diagnosis not present

## 2018-09-20 DIAGNOSIS — Z880 Allergy status to penicillin: Secondary | ICD-10-CM

## 2018-09-20 DIAGNOSIS — Z881 Allergy status to other antibiotic agents status: Secondary | ICD-10-CM

## 2018-09-20 DIAGNOSIS — Z955 Presence of coronary angioplasty implant and graft: Secondary | ICD-10-CM

## 2018-09-20 LAB — CBC WITH DIFFERENTIAL/PLATELET
Abs Immature Granulocytes: 0.1 10*3/uL — ABNORMAL HIGH (ref 0.00–0.07)
Basophils Absolute: 0 10*3/uL (ref 0.0–0.1)
Basophils Relative: 0 %
Eosinophils Absolute: 0.1 10*3/uL (ref 0.0–0.5)
Eosinophils Relative: 1 %
HEMATOCRIT: 33.9 % — AB (ref 39.0–52.0)
Hemoglobin: 11.1 g/dL — ABNORMAL LOW (ref 13.0–17.0)
Immature Granulocytes: 1 %
Lymphocytes Relative: 8 %
Lymphs Abs: 0.8 10*3/uL (ref 0.7–4.0)
MCH: 27.3 pg (ref 26.0–34.0)
MCHC: 32.7 g/dL (ref 30.0–36.0)
MCV: 83.5 fL (ref 80.0–100.0)
Monocytes Absolute: 0.8 10*3/uL (ref 0.1–1.0)
Monocytes Relative: 8 %
Neutro Abs: 9 10*3/uL — ABNORMAL HIGH (ref 1.7–7.7)
Neutrophils Relative %: 82 %
Platelets: 190 10*3/uL (ref 150–400)
RBC: 4.06 MIL/uL — ABNORMAL LOW (ref 4.22–5.81)
RDW: 17.1 % — ABNORMAL HIGH (ref 11.5–15.5)
WBC: 10.8 10*3/uL — ABNORMAL HIGH (ref 4.0–10.5)
nRBC: 0 % (ref 0.0–0.2)

## 2018-09-20 LAB — URINALYSIS, ROUTINE W REFLEX MICROSCOPIC
Bilirubin Urine: NEGATIVE
Glucose, UA: NEGATIVE mg/dL
Ketones, ur: NEGATIVE mg/dL
Nitrite: POSITIVE — AB
Protein, ur: 30 mg/dL — AB
Specific Gravity, Urine: 1.013 (ref 1.005–1.030)
WBC, UA: >50 WBC/hpf — ABNORMAL HIGH (ref 0–5)
pH: 5 (ref 5.0–8.0)

## 2018-09-20 LAB — BASIC METABOLIC PANEL
Anion gap: 9 (ref 5–15)
BUN: 41 mg/dL — ABNORMAL HIGH (ref 8–23)
CO2: 26 mmol/L (ref 22–32)
Calcium: 8.6 mg/dL — ABNORMAL LOW (ref 8.9–10.3)
Chloride: 100 mmol/L (ref 98–111)
Creatinine, Ser: 1.33 mg/dL — ABNORMAL HIGH (ref 0.61–1.24)
GFR calc non Af Amer: 48 mL/min — ABNORMAL LOW (ref >60)
GFR, EST AFRICAN AMERICAN: 55 mL/min — AB (ref >60)
Glucose, Bld: 189 mg/dL — ABNORMAL HIGH (ref 70–99)
Potassium: 4 mmol/L (ref 3.5–5.1)
SODIUM: 135 mmol/L (ref 135–145)

## 2018-09-20 LAB — BRAIN NATRIURETIC PEPTIDE: B Natriuretic Peptide: 498 pg/mL — ABNORMAL HIGH (ref 0.0–100.0)

## 2018-09-20 LAB — INFLUENZA PANEL BY PCR (TYPE A & B)
INFLAPCR: NEGATIVE
Influenza B By PCR: POSITIVE — AB

## 2018-09-20 LAB — LACTIC ACID, PLASMA
LACTIC ACID, VENOUS: 1.2 mmol/L (ref 0.5–1.9)
Lactic Acid, Venous: 0.9 mmol/L (ref 0.5–1.9)

## 2018-09-20 LAB — TROPONIN I: Troponin I: 0.05 ng/mL (ref <0.03)

## 2018-09-20 MED ORDER — LEVOTHYROXINE SODIUM 25 MCG PO TABS
25.0000 ug | ORAL_TABLET | Freq: Every day | ORAL | Status: DC
  Administered 2018-09-21 – 2018-09-27 (×7): 25 ug via ORAL
  Filled 2018-09-20 (×9): qty 1

## 2018-09-20 MED ORDER — ROPINIROLE HCL 1 MG PO TABS
1.0000 mg | ORAL_TABLET | Freq: Every day | ORAL | Status: DC
  Administered 2018-09-21 – 2018-09-27 (×8): 1 mg via ORAL
  Filled 2018-09-20 (×8): qty 1

## 2018-09-20 MED ORDER — POLYETHYLENE GLYCOL 3350 17 G PO PACK
17.0000 g | PACK | Freq: Every day | ORAL | Status: DC | PRN
  Administered 2018-09-26 – 2018-09-27 (×2): 17 g via ORAL
  Filled 2018-09-20 (×2): qty 1

## 2018-09-20 MED ORDER — SODIUM CHLORIDE 0.9 % IV SOLN
INTRAVENOUS | Status: DC
  Administered 2018-09-20 – 2018-09-25 (×5): via INTRAVENOUS

## 2018-09-20 MED ORDER — OSELTAMIVIR PHOSPHATE 30 MG PO CAPS
30.0000 mg | ORAL_CAPSULE | Freq: Two times a day (BID) | ORAL | Status: DC
  Administered 2018-09-20 – 2018-09-23 (×6): 30 mg via ORAL
  Filled 2018-09-20 (×6): qty 1

## 2018-09-20 MED ORDER — IPRATROPIUM-ALBUTEROL 0.5-2.5 (3) MG/3ML IN SOLN
3.0000 mL | Freq: Once | RESPIRATORY_TRACT | Status: AC
  Administered 2018-09-20: 3 mL via RESPIRATORY_TRACT
  Filled 2018-09-20: qty 3

## 2018-09-20 MED ORDER — QUETIAPINE FUMARATE 25 MG PO TABS
50.0000 mg | ORAL_TABLET | Freq: Every day | ORAL | Status: DC
  Administered 2018-09-21 – 2018-09-27 (×8): 50 mg via ORAL
  Filled 2018-09-20 (×8): qty 2

## 2018-09-20 MED ORDER — ACETAMINOPHEN 325 MG PO TABS
650.0000 mg | ORAL_TABLET | Freq: Once | ORAL | Status: AC
  Administered 2018-09-20: 650 mg via ORAL
  Filled 2018-09-20: qty 2

## 2018-09-20 MED ORDER — DABIGATRAN ETEXILATE MESYLATE 75 MG PO CAPS
75.0000 mg | ORAL_CAPSULE | Freq: Every day | ORAL | Status: DC
  Administered 2018-09-21 – 2018-09-24 (×4): 75 mg via ORAL
  Filled 2018-09-20 (×4): qty 1

## 2018-09-20 MED ORDER — INSULIN ASPART 100 UNIT/ML ~~LOC~~ SOLN
0.0000 [IU] | Freq: Three times a day (TID) | SUBCUTANEOUS | Status: DC
  Administered 2018-09-21: 3 [IU] via SUBCUTANEOUS
  Administered 2018-09-22: 5 [IU] via SUBCUTANEOUS
  Administered 2018-09-23: 2 [IU] via SUBCUTANEOUS
  Administered 2018-09-23: 3 [IU] via SUBCUTANEOUS
  Administered 2018-09-23: 2 [IU] via SUBCUTANEOUS
  Administered 2018-09-24 (×2): 3 [IU] via SUBCUTANEOUS
  Administered 2018-09-25: 8 [IU] via SUBCUTANEOUS
  Administered 2018-09-25: 3 [IU] via SUBCUTANEOUS
  Administered 2018-09-25: 5 [IU] via SUBCUTANEOUS
  Administered 2018-09-27: 2 [IU] via SUBCUTANEOUS

## 2018-09-20 MED ORDER — ONDANSETRON HCL 4 MG/2ML IJ SOLN: 4.0000 mg | Freq: Four times a day (QID) | INTRAMUSCULAR | Status: DC | PRN

## 2018-09-20 MED ORDER — ONDANSETRON HCL 4 MG PO TABS: 4.0000 mg | ORAL_TABLET | Freq: Four times a day (QID) | ORAL | Status: DC | PRN

## 2018-09-20 MED ORDER — GUAIFENESIN ER 600 MG PO TB12
600.0000 mg | ORAL_TABLET | Freq: Two times a day (BID) | ORAL | Status: DC
  Administered 2018-09-21 – 2018-09-27 (×15): 600 mg via ORAL
  Filled 2018-09-20 (×16): qty 1

## 2018-09-20 MED ORDER — INSULIN ASPART 100 UNIT/ML ~~LOC~~ SOLN
0.0000 [IU] | Freq: Every day | SUBCUTANEOUS | Status: DC
  Administered 2018-09-21 – 2018-09-22 (×2): 2 [IU] via SUBCUTANEOUS

## 2018-09-20 MED ORDER — FERROUS SULFATE 325 (65 FE) MG PO TABS
325.0000 mg | ORAL_TABLET | Freq: Every day | ORAL | Status: DC
  Administered 2018-09-21 – 2018-09-27 (×7): 325 mg via ORAL
  Filled 2018-09-20 (×8): qty 1

## 2018-09-20 MED ORDER — ACETAMINOPHEN 325 MG PO TABS
650.0000 mg | ORAL_TABLET | Freq: Four times a day (QID) | ORAL | Status: DC | PRN
  Administered 2018-09-22: 650 mg via ORAL
  Filled 2018-09-20: qty 2

## 2018-09-20 MED ORDER — ENSURE ENLIVE PO LIQD
237.0000 mL | Freq: Two times a day (BID) | ORAL | Status: DC
  Administered 2018-09-21 – 2018-09-28 (×13): 237 mL via ORAL

## 2018-09-20 MED ORDER — CIPROFLOXACIN IN D5W 400 MG/200ML IV SOLN
400.0000 mg | Freq: Two times a day (BID) | INTRAVENOUS | Status: DC
  Administered 2018-09-20 – 2018-09-22 (×4): 400 mg via INTRAVENOUS
  Filled 2018-09-20 (×4): qty 200

## 2018-09-20 MED ORDER — ATORVASTATIN CALCIUM 40 MG PO TABS
40.0000 mg | ORAL_TABLET | Freq: Every day | ORAL | Status: DC
  Administered 2018-09-21 – 2018-09-27 (×7): 40 mg via ORAL
  Filled 2018-09-20 (×7): qty 1

## 2018-09-20 MED ORDER — PANTOPRAZOLE SODIUM 40 MG PO TBEC
40.0000 mg | DELAYED_RELEASE_TABLET | Freq: Every day | ORAL | Status: DC
  Administered 2018-09-21 – 2018-09-23 (×3): 40 mg via ORAL
  Filled 2018-09-20 (×3): qty 1

## 2018-09-20 MED ORDER — ALBUTEROL SULFATE (2.5 MG/3ML) 0.083% IN NEBU: 2.5000 mg | INHALATION_SOLUTION | Freq: Four times a day (QID) | RESPIRATORY_TRACT | Status: DC | PRN

## 2018-09-20 NOTE — Progress Notes (Signed)
Pharmacy Antibiotic Note  Neil [name] is a 85 y.o. male admitted on 09/20/2018 with UTI.  Pharmacy has been consulted for Cipro dosing.  Plan: Cipro 400mg  IV q12h F/U cxs and sensitivities Transition to po as indicated Monitor V/S, labs  Height: 5\' 9"  (175.3 cm) Weight: 152 lb 1.9 oz (69 kg) IBW/kg (Calculated) : 70.7  Temp (24hrs), Avg:100.2 F (37.9 C), Min:100 F (37.8 C), Max:100.3 F (37.9 C)  Recent Labs  Lab 09/20/18 2020 09/20/18 2146  WBC 10.8*  --   CREATININE 1.33*  --   LATICACIDVEN 1.2 0.9    Estimated Creatinine Clearance: 38.2 mL/min (A) (by C-G formula based on SCr of 1.33 mg/dL (H)).    Allergies  Allergen Reactions  . Cephalexin Other (See Comments)    hallucinations Other reaction(s): Delusions (intolerance)  . Penicillins Swelling and Rash    Itching Has patient had a PCN reaction causing immediate rash, facial/tongue/throat swelling, SOB or lightheadedness with hypotension: Yes Has patient had a PCN reaction causing severe rash involving mucus membranes or skin necrosis: Yes Has patient had a PCN reaction that required hospitalization: No Has patient had a PCN reaction occurring within the last 10 years: No If all of the above answers are "NO", then may proceed with Cephalosporin use.    Antimicrobials this admission: cipro 3/13>>  Dose adjustments this admission: n/a  Microbiology results: 3/13 UCx: pending  Thank you for allowing pharmacy to be a part of this patient's care.  Isac Sarna, BS [place], [place] Clinical Pharmacist Pager [phone] 09/20/2018 10:17 PM

## 2018-09-20 NOTE — ED Notes (Signed)
Patient on 12 lead at this time.  

## 2018-09-20 NOTE — ED Provider Notes (Signed)
[facility] Provider Note   CSN: [id number] Arrival date & time: 09/20/18  1854    History   Chief Complaint Chief Complaint  Patient presents with  . Cough  . Fever    HPI Neil [name] is a 85 y.o. male.     The history is provided by a caregiver, the spouse and the patient. History limited by: Hx dementia.  Cough  Associated symptoms: fever   Fever  Associated symptoms: cough     Pt was seen at 1935.  Per pt's family and caregiver: States pt has had a cough for the past 2 weeks. Pt apparently has been taking multiple medications for same, including Zithromax, benadryl and prednisone, without improvement. Caregiver states pt "is getting choked up on the phlegm when he coughs." Denies choking. Pt's caregiver states pt has had "a low grade fever." Pt has had poor PO intake and been generally weak for the past few days, and been unable to ambulate due to generalized weakness (baseline stands, walks short distances). Pt did not receive a flu shot this season. No reported vomiting/diarrhea, CP, abd pain, rash. Pt himself has hx of dementia.   Past Medical History:  Diagnosis Date  . Chronic kidney disease (CKD), stage III (moderate) (HCC)   . Chronic systolic CHF (congestive heart failure) ([place])   . Complete heart block ([place])   . Coronary artery disease    status post [id number]  . Dementia ([place])   . Dyslipidemia   . Foley catheter in place   . Hypertension   . Ischemic cardiomyopathy    per last echo 11-10-2017  ef 15-20%  . Osteoarthritis   . Permanent atrial fibrillation   . Presence of permanent cardiac pacemaker cardiologist-  dr Samuel Bouche Jude status post generator replacement 2009 (original placement 02/ 1998)  . Renal artery stenosis (HCC)    95%  left, 50% right  . Requires assistance with activities of daily living (ADL)   . 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 Type 2 diabetes mellitus ([place])   . Urinary retention   . Uses wheelchair     Patient  Active Problem List   Diagnosis Date Noted  . Systolic congestive heart failure ([place])   . Palliative care encounter   . Goals of care, counseling/discussion   . Aspiration pneumonia ([place]) 02/25/2018  . UTI (urinary tract infection) 02/25/2018  . Protein-calorie malnutrition, severe 12/18/2017  . Hypoglycemia due to type 2 diabetes mellitus ([place]) 12/17/2017  . Confusion   . General weakness   . Dehydration 11/09/2017  . FTT (failure to thrive) in adult 11/09/2017  . AKI (acute kidney injury) ([place]) 11/09/2017  . Dementia ([place]) 11/09/2017  . Renal artery stenosis ([place])   . CKD (chronic kidney disease) stage 3, GFR 30-59 ml/min (HCC)   . Pacemaker-St. Jude 04/20/2011  . Permanent atrial fibrillation 05/03/2010  . AV BLOCK, COMPLETE 11/02/2009  . ABDOMINAL PAIN-EPIGASTRIC 10/27/2008  . AODM 07/25/2008  . Type 2 diabetes mellitus with hyperlipidemia ([place]) 07/25/2008  . Cardiomyopathy, ischemic 07/25/2008  . ATHEROSCLEROSIS OF RENAL ARTERY 07/25/2008  . HYPERTENSION, BENIGN 07/25/2008    Past Surgical History:  Procedure Laterality Date  .  suspicous skin lesions-nose and upper back-removed 04/15/18    . CARDIOVASCULAR STRESS TEST  05-22-2012   dr Caryl Comes   Low risk adenosine no exercise nuclear study w/ no ischemia/  ef 31%,  LV moderately enlarged , global hypokinesis and severe hypokinesis of the inferior wall  .  CORONARY ANGIOPLASTY WITH STENT PLACEMENT  spring 1998   stenting  . CORONARY ARTERY BYPASS GRAFT  fall 1998   x4  . INSERTION OF SUPRAPUBIC CATHETER N/A 04/16/2018   Procedure: INSERTION OF SUPRAPUBIC CATHETER;  Surgeon: Irine Seal, MD;  Location: [facility];  Service: Urology;  Laterality: N/A;  . PACEMAKER GENERATOR CHANGE  10-30-2007  dr Samuel Bouche Jude dual chamber  . PACEMAKER INSERTION  02/ 1998   dr Caryl Comes  . skin lesions    . TRANSTHORACIC ECHOCARDIOGRAM  11/10/2017   mild LVH, ef 15-20%, diffuse hypokinesis, LVDF not evaluated/  mild AS/  severe LAE/  RVSF severely  reduced/  mild MR, PR, and TR/  PASP 45mmHg        Home Medications    Prior to Admission medications   Medication Sig Start Date End Date Taking? Authorizing Provider  albuterol (PROVENTIL) (2.5 MG/3ML) 0.083% nebulizer solution Take 3 mLs (2.5 mg total) by nebulization every 6 (six) hours as needed for wheezing or shortness of breath. 02/27/18   Kathie Dike, MD  atorvastatin (LIPITOR) 40 MG tablet Take 40 mg by mouth daily at 12 noon.     [provider]  benzonatate (TESSALON) 100 MG capsule Take 100 mg by mouth 3 (three) times daily as needed for cough.    [provider]  dabigatran (PRADAXA) 75 MG CAPS capsule Take 75 mg by mouth daily.    [provider]  feeding supplement, ENSURE ENLIVE, (ENSURE ENLIVE) LIQD Take 237 mLs by mouth 2 (two) times daily between meals. Patient taking differently: Take 237 mLs by mouth daily after breakfast.  12/18/17   Manuella Ghazi, Pratik D, DO  FEROSUL 325 (65 Fe) MG tablet Take 325 mg by mouth daily.  03/14/18   [provider]  furosemide (LASIX) 40 MG tablet Take 1 tablet (40 mg total) by mouth daily. 08/04/18   Varney Biles, MD  glipiZIDE (GLUCOTROL XL) 2.5 MG 24 hr tablet Take 2.5-5 mg by mouth as directed. Depends on blood sugar. Checks twice daily. 03/12/18   [provider]  lactobacillus acidophilus & bulgar (LACTINEX) chewable tablet Chew 2 tablets by mouth at bedtime. 08/10/18   Terrilee Croak, MD  levofloxacin (LEVAQUIN) 250 MG tablet Take 2 tablets (500 mg total) by mouth daily. 08/17/18   Lacretia Leigh, MD  levothyroxine (SYNTHROID, LEVOTHROID) 25 MCG tablet Take 25 mcg by mouth daily at 12 noon.  04/13/18   [provider]  Maltodextrin-Xanthan Gum ([place]) POWD Thicken fluids to nectar thick Patient not taking: Reported on 08/17/2018 02/27/18   Kathie Dike, MD  pantoprazole (PROTONIX) 40 MG tablet Take 40 mg by mouth daily at 12 noon.     [provider]  polyethylene  glycol (MIRALAX) packet Take 17 g by mouth daily. Patient taking differently: Take 17 g by mouth daily as needed for moderate constipation.  03/03/18   Fransico Meadow, PA-C  QUEtiapine (SEROQUEL) 50 MG tablet Take 50 mg by mouth at bedtime.    [provider]  rOPINIRole (REQUIP) 0.5 MG tablet Take 1 mg by mouth at bedtime.     [provider]    Family History Family History  Problem Relation Age of Onset  . Other Mother   . Cancer Mother        breast  . Alzheimer's disease Sister     Social History Social History   Tobacco Use  . Smoking status: Never Smoker  . Smokeless tobacco: Never  Used  Substance Use Topics  . Alcohol use: No  . Drug use: No     Allergies   Cephalexin and Penicillins   Review of Systems Review of Systems  Unable to perform ROS: Dementia  Constitutional: Positive for fever.  Respiratory: Positive for cough.      Physical Exam Updated Vital Signs BP 128/73   Pulse 82   Temp 100.3 F (37.9 C) (Oral)   Resp 18   Ht 5\' 9"  (1.753 m)   Wt 69 kg   SpO2 94%   BMI 22.46 kg/m    Patient Vitals for the past 24 hrs:  BP Temp Temp src Pulse Resp SpO2 Height Weight  09/20/18 2129 - 100 F (37.8 C) Rectal - - - - -  09/20/18 2100 127/70 - - 71 - 95 % - -  09/20/18 2030 126/72 - - 68 - 99 % - -  09/20/18 1930 128/73 - - 82 - 94 % - -  09/20/18 1911 - - - - - - 5\' 9"  (1.753 m) 69 kg  09/20/18 1910 123/68 100.3 F (37.9 C) Oral 77 18 97 % 5\' 10"  (1.778 m) 69 kg   21:21:29 Orthostatic Vital Signs HC  Orthostatic Lying   BP- Lying: 134/66  Pulse- Lying: 68      Orthostatic Sitting  BP- Sitting: 138/61  Pulse- Sitting: 70  21:23:28 ED Notes HC  Pt unable to stand for orthostatics or ambulate     Physical Exam 1940: Physical examination:  Nursing notes reviewed; Vital signs and O2 SAT reviewed;  Constitutional: Well developed, Well nourished, In no acute distress; Head:  Normocephalic, atraumatic; Eyes: EOMI, PERRL, No  scleral icterus; ENMT: Mouth and pharynx normal, Mucous membranes dry; Neck: Supple, Full range of motion, No lymphadenopathy; Cardiovascular: Regular rate and rhythm, No gallop; Respiratory: Breath sounds coarse & equal bilaterally, No wheezes.  +weak cough during exam. Speaking sentences, Normal respiratory effort/excursion; Chest: Nontender, Movement normal; Abdomen: Soft, Nontender, Nondistended, Normal bowel sounds; Genitourinary: No CVA tenderness. +chronic suprapubic catheter with cloudy urine in tubing.; Extremities: Peripheral pulses normal, No tenderness, No edema, No calf edema or asymmetry.; Neuro: AA&Ox3, Major CN grossly intact.  Speech clear. No gross focal motor or sensory deficits in extremities.; Skin: Color normal, Warm, Dry.   ED Treatments / Results  Labs (all labs ordered are listed, but only abnormal results are displayed)   EKG EKG Interpretation  Date/Time:  Friday September 20 2018 20:01:50 EDT Ventricular Rate:  75 PR Interval:    QRS Duration: 182 QT Interval:  474 QTC Calculation: 684 R Axis:   -28 Text Interpretation:  Ventricular-paced complexes No further analysis attempted due to paced rhythm Artifact When compared with ECG of 07/06/2018 No significant change was found Confirmed by Francine Graven [phone]) on 09/20/2018 8:51:50 PM   Radiology   Procedures Procedures (including critical care time)  Medications Ordered in ED Medications - No data to display   Initial Impression / Assessment and Plan / ED Course  I have reviewed the triage vital signs and the nursing notes.  Pertinent labs & imaging results that were available during my care of the patient were reviewed by me and considered in my medical decision making (see chart for details).     MDM Reviewed: previous chart, nursing note and vitals Reviewed previous: labs and ECG Interpretation: labs, ECG and x-ray   Results for orders placed or performed during the hospital encounter of  09/20/18  Influenza panel by PCR (type  A & B)  Result Value Ref Range   Influenza A By PCR NEGATIVE NEGATIVE   Influenza B By PCR POSITIVE (A) NEGATIVE  Urinalysis, Routine w reflex microscopic  Result Value Ref Range   Color, Urine YELLOW YELLOW   APPearance CLOUDY (A) CLEAR   Specific Gravity, Urine 1.013 1.005 - 1.030   pH 5.0 5.0 - 8.0   Glucose, UA NEGATIVE NEGATIVE mg/dL   Hgb urine dipstick SMALL (A) NEGATIVE   Bilirubin Urine NEGATIVE NEGATIVE   Ketones, ur NEGATIVE NEGATIVE mg/dL   Protein, ur 30 (A) NEGATIVE mg/dL   Nitrite POSITIVE (A) NEGATIVE   Leukocytes,Ua LARGE (A) NEGATIVE   RBC / HPF 11-20 0 - 5 RBC/hpf   WBC, UA >50 (H) 0 - 5 WBC/hpf   Bacteria, UA RARE (A) NONE SEEN   Mucus PRESENT    Hyaline Casts, UA PRESENT   Basic metabolic panel  Result Value Ref Range   Sodium 135 135 - 145 mmol/L   Potassium 4.0 3.5 - 5.1 mmol/L   Chloride 100 98 - 111 mmol/L   CO2 26 22 - 32 mmol/L   Glucose, Bld 189 (H) 70 - 99 mg/dL   BUN 41 (H) 8 - 23 mg/dL   Creatinine, Ser 1.33 (H) 0.61 - 1.24 mg/dL   Calcium 8.6 (L) 8.9 - 10.3 mg/dL   GFR calc non Af Amer 48 (L) >60 mL/min   GFR calc Af Amer 55 (L) >60 mL/min   Anion gap 9 5 - 15  Troponin I - Once  Result Value Ref Range   Troponin I 0.05 (HH) <0.03 ng/mL  Lactic acid, plasma  Result Value Ref Range   Lactic Acid, Venous 1.2 0.5 - 1.9 mmol/L  CBC with Differential  Result Value Ref Range   WBC 10.8 (H) 4.0 - 10.5 K/uL   RBC 4.06 (L) 4.22 - 5.81 MIL/uL   Hemoglobin 11.1 (L) 13.0 - 17.0 g/dL   HCT 33.9 (L) 39.0 - 52.0 %   MCV 83.5 80.0 - 100.0 fL   MCH 27.3 26.0 - 34.0 pg   MCHC 32.7 30.0 - 36.0 g/dL   RDW 17.1 (H) 11.5 - 15.5 %   Platelets 190 150 - 400 K/uL   nRBC 0.0 0.0 - 0.2 %   Neutrophils Relative % 82 %   Neutro Abs 9.0 (H) 1.7 - 7.7 K/uL   Lymphocytes Relative 8 %   Lymphs Abs 0.8 0.7 - 4.0 K/uL   Monocytes Relative 8 %   Monocytes Absolute 0.8 0.1 - 1.0 K/uL   Eosinophils Relative 1 %    Eosinophils Absolute 0.1 0.0 - 0.5 K/uL   Basophils Relative 0 %   Basophils Absolute 0.0 0.0 - 0.1 K/uL   Immature Granulocytes 1 %   Abs Immature Granulocytes 0.10 (H) 0.00 - 0.07 K/uL  Brain natriuretic peptide  Result Value Ref Range   B Natriuretic Peptide 498.0 (H) 0.0 - 100.0 pg/mL   Dg Chest 2 View Result Date: 09/20/2018 CLINICAL DATA:  Cough and fever. EXAM: CHEST - 2 VIEW COMPARISON:  Chest radiograph August 04, 2018 FINDINGS: Stable cardiomegaly. Calcified aortic arch. Status post median sternotomy for CABG with fractured mid sternotomy wire. Mild pulmonary vascular congestion without pleural effusion or focal consolidation. No pneumothorax. Dual lead LEFT cardiac pacemaker in situ. Soft tissue planes and included osseous structures are nonacute. IMPRESSION: 1. Stable cardiomegaly and mild pulmonary vascular congestion. 2.  Aortic Atherosclerosis (ICD10-I70.0). Electronically Signed   By: Elon Alas  M.D.   On: 09/20/2018 19:58   Results for SAMIL, MECHAM (MRN [id number]) as of 09/20/2018 21:33  Ref. Range 05/15/2018 23:26 07/06/2018 18:50 09/20/2018 20:20  B Natriuretic Peptide Latest Ref Range: 0.0 - 100.0 pg/mL 506.0 (H) 441.7 (H) 498.0 (H)   Results for TIMOTH, SCHARA (MRN [id number]) as of 09/20/2018 21:33  Ref. Range 11/09/2017 22:03 11/10/2017 05:56 12/17/2017 15:52 05/15/2018 23:26 09/20/2018 20:20  Troponin I Latest Ref Range: <0.03 ng/mL 0.05 (HH) 0.05 (HH) 0.05 (HH) 0.04 (HH) 0.05 ([place])    Results for ASHLEIGH, ARYA (MRN [id number]) as of 09/20/2018 21:33  Ref. Range 08/04/2018 03:58 08/08/2018 10:40 08/09/2018 04:38 08/17/2018 11:10 09/20/2018 20:20  BUN Latest Ref Range: 8 - 23 mg/dL 53 (H) 38 (H) 39 (H) 40 (H) 41 (H)  Creatinine Latest Ref Range: 0.61 - 1.24 mg/dL 1.44 (H) 1.27 (H) 1.39 (H) 1.22 1.33 (H)    2145:  Pt unable to stand at bedside d/t weakness. Short neb and judicious IVF given. APAP given for fever.  Pt with chronic suprapubic catheter, unclear if UTI vs  colonization; UC pending. BC pending. Troponin baseline elevated. BNP lower than previous and CXR without overt CHF. T/C returned from [facility] Dr. Nehemiah Settle, case discussed, including:  HPI, pertinent PM/SHx, VS/PE, dx testing, ED course and treatment:  Agreeable to admit.     Final Clinical Impressions(s) / ED Diagnoses   Final diagnoses:  None    ED Discharge Orders    None       Francine Graven, DO 09/23/18 2110

## 2018-09-20 NOTE — ED Triage Notes (Signed)
Several week history of cough and family sickness  Finished meds but continued to cough  Spouse gives extensive report of her URI sx, but does not elaborate regarding pt   Pt here for fever

## 2018-09-20 NOTE — ED Notes (Signed)
Pt unable to stand for orthostatics or ambulate

## 2018-09-20 NOTE — ED Notes (Signed)
Date and time results received: 09/20/18 2117 (use smartphrase ".now" to insert current time)  Test: Troponin Critical Value: 0.05  Name of Provider Notified: Dr Thurnell Garbe Orders Received? Or Actions Taken?: NA

## 2018-09-20 NOTE — ED Notes (Signed)
Family member reports she had the flu shot  Pt has not   Flu swab to lab

## 2018-09-20 NOTE — ED Notes (Signed)
ED TO INPATIENT HANDOFF REPORT  ED Nurse Name and Phone #: [phone] Esau Grew Name/Age/Gender Neil [name] 85 y.o. male Room/Bed: APA02/APA02  Code Status   Code Status: Prior  Home/SNF/Other Home Patient oriented to: self Is this baseline? Yes   Triage Complete: Triage complete  Chief Complaint High Temp  Triage Note Several week history of cough and family sickness  Finished meds but continued to cough  Spouse gives extensive report of her URI sx, but does not elaborate regarding pt   Pt here for fever   Allergies Allergies  Allergen Reactions  . Cephalexin Other (See Comments)    hallucinations Other reaction(s): Delusions (intolerance)  . Penicillins Swelling and Rash    Itching Has patient had a PCN reaction causing immediate rash, facial/tongue/throat swelling, SOB or lightheadedness with hypotension: Yes Has patient had a PCN reaction causing severe rash involving mucus membranes or skin necrosis: Yes Has patient had a PCN reaction that required hospitalization: No Has patient had a PCN reaction occurring within the last 10 years: No If all of the above answers are "NO", then may proceed with Cephalosporin use.    Level of Care/Admitting Diagnosis ED Disposition    ED Disposition Condition [place] [facility] Area: [facility] [[id number]]  Level of Care: Telemetry [5]  Diagnosis: Influenza B [[id number]]  Admitting Physician: Truett Mainland [[id number]]  Attending Physician: Truett Mainland [[id number]]  Estimated length of stay: 3 - 4 days  Certification:: I certify this patient will need inpatient services for at least 2 midnights  PT Class (Do Not Modify): Inpatient [101]  PT Acc Code (Do Not Modify): Private [1]       B Medical/Surgery History Past Medical History:  Diagnosis Date  . Chronic kidney disease (CKD), stage III (moderate) (HCC)   . Chronic systolic CHF (congestive heart failure) ([place])   . Complete heart block ([place])   .  Coronary artery disease    status post [id number]  . Dementia ([place])   . Dyslipidemia   . Foley catheter in place   . Hypertension   . Ischemic cardiomyopathy    per last echo 11-10-2017  ef 15-20%  . Osteoarthritis   . Permanent atrial fibrillation   . Presence of permanent cardiac pacemaker cardiologist-  dr Samuel Bouche Jude status post generator replacement 2009 (original placement 02/ 1998)  . Renal artery stenosis (HCC)    95%  left, 50% right  . Requires assistance with activities of daily living (ADL)   . 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 Type 2 diabetes mellitus ([place])   . Urinary retention   . Uses wheelchair    Past Surgical History:  Procedure Laterality Date  .  suspicous skin lesions-nose and upper back-removed 04/15/18    . CARDIOVASCULAR STRESS TEST  05-22-2012   dr Caryl Comes   Low risk adenosine no exercise nuclear study w/ no ischemia/  ef 31%,  LV moderately enlarged , global hypokinesis and severe hypokinesis of the inferior wall  . CORONARY ANGIOPLASTY WITH STENT PLACEMENT  spring 1998   stenting  . CORONARY ARTERY BYPASS GRAFT  fall 1998   x4  . INSERTION OF SUPRAPUBIC CATHETER N/A 04/16/2018   Procedure: INSERTION OF SUPRAPUBIC CATHETER;  Surgeon: Irine Seal, MD;  Location: [facility];  Service: Urology;  Laterality: N/A;  . PACEMAKER GENERATOR CHANGE  10-30-2007  dr Samuel Bouche Jude dual chamber  . PACEMAKER INSERTION  02/ 1998  dr Caryl Comes  . skin lesions    . TRANSTHORACIC ECHOCARDIOGRAM  11/10/2017   mild LVH, ef 15-20%, diffuse hypokinesis, LVDF not evaluated/  mild AS/  severe LAE/  RVSF severely reduced/  mild MR, PR, and TR/  PASP 61mmHg     A IV Location/Drains/Wounds Patient Lines/Drains/Airways Status   Active Line/Drains/Airways    Name:   Placement date:   Placement time:   Site:   Days:   Peripheral IV 09/20/18 Right Forearm   09/20/18    2028    Forearm   less than 1   Suprapubic Catheter   -    -    -             Intake/Output Last 24 hours No intake or  output data in the 24 hours ending 09/20/18 2232  Labs/Imaging Results for orders placed or performed during the hospital encounter of 09/20/18 (from the past 48 hour(s))  Influenza panel by PCR (type A & B)     Status: Abnormal   Collection Time: 09/20/18  7:14 PM  Result Value Ref Range   Influenza A By PCR NEGATIVE NEGATIVE   Influenza B By PCR POSITIVE (A) NEGATIVE    Comment: (NOTE) The Xpert Xpress Flu assay is intended as an aid in the diagnosis of  influenza and should not be used as a sole basis for treatment.  This  assay is FDA approved for nasopharyngeal swab specimens only. Nasal  washings and aspirates are unacceptable for Xpert Xpress Flu testing. Performed at [facility], [address], [place], [place] [address]   Urinalysis, Routine w reflex microscopic     Status: Abnormal   Collection Time: 09/20/18  8:20 PM  Result Value Ref Range   Color, Urine YELLOW YELLOW   APPearance CLOUDY (A) CLEAR   Specific Gravity, Urine 1.013 1.005 - 1.030   pH 5.0 5.0 - 8.0   Glucose, UA NEGATIVE NEGATIVE mg/dL   Hgb urine dipstick SMALL (A) NEGATIVE   Bilirubin Urine NEGATIVE NEGATIVE   Ketones, ur NEGATIVE NEGATIVE mg/dL   Protein, ur 30 (A) NEGATIVE mg/dL   Nitrite POSITIVE (A) NEGATIVE   Leukocytes,Ua LARGE (A) NEGATIVE   RBC / HPF 11-20 0 - 5 RBC/hpf   WBC, UA >50 (H) 0 - 5 WBC/hpf   Bacteria, UA RARE (A) NONE SEEN   Mucus PRESENT    Hyaline Casts, UA PRESENT     Comment: Performed at [facility], [address] [place], [place] [address]  Basic metabolic panel     Status: Abnormal   Collection Time: 09/20/18  8:20 PM  Result Value Ref Range   Sodium 135 135 - 145 mmol/L   Potassium 4.0 3.5 - 5.1 mmol/L   Chloride 100 98 - 111 mmol/L   CO2 26 22 - 32 mmol/L   Glucose, Bld 189 (H) 70 - 99 mg/dL   BUN 41 (H) 8 - 23 mg/dL   Creatinine, Ser 1.33 (H) 0.61 - 1.24 mg/dL   Calcium 8.6 (L) 8.9 - 10.3 mg/dL   GFR calc non Af Amer 48 (L) >60 mL/min   GFR calc Af Amer 55 (L)  >60 mL/min   Anion gap 9 5 - 15    Comment: Performed at [facility], [address], [place],  [address]  Troponin I - Once     Status: Abnormal   Collection Time: 09/20/18  8:20 PM  Result Value Ref Range   Troponin I 0.05 (HH) <0.03  ng/mL    Comment: CRITICAL RESULT CALLED TO, READ BACK BY AND VERIFIED WITH: Evadne Ose,H ON 09/20/18 AT 2115 BY LOY,C Performed at [facility], [address] [place], [place] [address]   Lactic acid, plasma     Status: None   Collection Time: 09/20/18  8:20 PM  Result Value Ref Range   Lactic Acid, Venous 1.2 0.5 - 1.9 mmol/L    Comment: Performed at [facility], [address], [place], [place] [address]  CBC with Differential     Status: Abnormal   Collection Time: 09/20/18  8:20 PM  Result Value Ref Range   WBC 10.8 (H) 4.0 - 10.5 K/uL   RBC 4.06 (L) 4.22 - 5.81 MIL/uL   Hemoglobin 11.1 (L) 13.0 - 17.0 g/dL   HCT 33.9 (L) 39.0 - 52.0 %   MCV 83.5 80.0 - 100.0 fL   MCH 27.3 26.0 - 34.0 pg   MCHC 32.7 30.0 - 36.0 g/dL   RDW 17.1 (H) 11.5 - 15.5 %   Platelets 190 150 - 400 K/uL   nRBC 0.0 0.0 - 0.2 %   Neutrophils Relative % 82 %   Neutro Abs 9.0 (H) 1.7 - 7.7 K/uL   Lymphocytes Relative 8 %   Lymphs Abs 0.8 0.7 - 4.0 K/uL   Monocytes Relative 8 %   Monocytes Absolute 0.8 0.1 - 1.0 K/uL   Eosinophils Relative 1 %   Eosinophils Absolute 0.1 0.0 - 0.5 K/uL   Basophils Relative 0 %   Basophils Absolute 0.0 0.0 - 0.1 K/uL   Immature Granulocytes 1 %   Abs Immature Granulocytes 0.10 (H) 0.00 - 0.07 K/uL    Comment: Performed at [facility], [address], [place], [place] [address]  Brain natriuretic peptide     Status: Abnormal   Collection Time: 09/20/18  8:20 PM  Result Value Ref Range   B Natriuretic Peptide 498.0 (H) 0.0 - 100.0 pg/mL    Comment: Performed at [facility], [address], [place], [place] [address]  Culture, blood (routine x 2)     Status: None (Preliminary result)   Collection Time: 09/20/18  9:45 PM  Result  Value Ref Range   Specimen Description RIGHT ANTECUBITAL    Special Requests      BOTTLES DRAWN AEROBIC AND ANAEROBIC Blood Culture adequate volume Performed at [facility], [address] [place], [place] [address]    Culture PENDING    Report Status PENDING   Lactic acid, plasma     Status: None   Collection Time: 09/20/18  9:46 PM  Result Value Ref Range   Lactic Acid, Venous 0.9 0.5 - 1.9 mmol/L    Comment: Performed at [facility], [address] [place], [place] [address]  Culture, blood (routine x 2)     Status: None (Preliminary result)   Collection Time: 09/20/18  9:46 PM  Result Value Ref Range   Specimen Description LEFT ANTECUBITAL    Special Requests      BOTTLES DRAWN AEROBIC AND ANAEROBIC Blood Culture adequate volume Performed at [facility], [address] [place], [place] [address]    Culture PENDING    Report Status PENDING    Dg Chest 2 View  Result Date: 09/20/2018 CLINICAL DATA:  Cough and fever. EXAM: CHEST - 2 VIEW COMPARISON:  Chest radiograph August 04, 2018 FINDINGS: Stable cardiomegaly. Calcified aortic arch. Status post median sternotomy for CABG with fractured mid sternotomy wire. Mild pulmonary vascular congestion without pleural effusion or  focal consolidation. No pneumothorax. Dual lead LEFT cardiac pacemaker in situ. Soft tissue planes and included osseous structures are nonacute. IMPRESSION: 1. Stable cardiomegaly and mild pulmonary vascular congestion. 2.  Aortic Atherosclerosis (ICD10-I70.0). Electronically Signed   By: Elon Alas M.D.   On: 09/20/2018 19:58    Pending Labs Unresulted Labs (From admission, onward)    Start     Ordered   09/20/18 1947  Urine culture  ONCE - STAT,   STAT     09/20/18 1946   Signed and Held  Basic metabolic panel  Tomorrow morning,   R     Signed and Held   Signed and Held  CBC  Tomorrow morning,   R     Signed and Held          Vitals/Pain Today's Vitals   09/20/18 2030 09/20/18 2100 09/20/18  2129 09/20/18 2221  BP: 126/72 127/70    Pulse: 68 71    Resp:      Temp:   100 F (37.8 C)   TempSrc:   Rectal   SpO2: 99% 95%  99%  Weight:      Height:        Isolation Precautions Droplet precaution  Medications Medications  0.9 %  sodium chloride infusion ( Intravenous New Bag/Given 09/20/18 2217)  oseltamivir (TAMIFLU) capsule 30 mg (30 mg Oral Given 09/20/18 2217)  ciprofloxacin (CIPRO) IVPB 400 mg (has no administration in time range)  ipratropium-albuterol (DUONEB) 0.5-2.5 (3) MG/3ML nebulizer solution 3 mL (3 mLs Nebulization Given 09/20/18 2221)  acetaminophen (TYLENOL) tablet 650 mg (650 mg Oral Given 09/20/18 2217)    Mobility non-ambulatory     Focused Assessments    R Recommendations: See Admitting Provider Note  Report given to:   Additional Notes:

## 2018-09-20 NOTE — H&P (Signed)
History and Physical  CASTER FAYETTE EGB:[id number] DOB: [birth year] DOA: 09/20/2018  Referring physician: Dr Thurnell Garbe, ED physician PCP: Redmond School, MD  Outpatient Specialists:   Patient Coming From: home  Chief Complaint:   HPI: Neil [name] is a 85 y.o. male with a history of Dementia, hypertension, complete heart block, stage III chronic kidney disease, suprapubic catheter, permanent atrial fibrillation, ischemic cardiomyopathy with chronic diastolic grade 1 heart failure.  The patient has dementia, history obtained from the caregiver.  Patient has had a productive cough for the past couple of weeks and has been on a couple courses of antibiotics, including Zithromax and Levaquin.  He just recently finished course of Zithromax.  He has a difficult time being up sputum but feels that it is thick.  No palliating or provoking factors.  He does have sick contacts in the form of patient's caregiver who is had a low-grade fever over the past week.  Additionally, the patient started having a fever evening and was brought to the hospital for evaluation  Emergency Department Course: Influenza B positive, white count 10.4, need 1.33.  BNP 498.  Lactic acid normal.  Review of Systems:   Unable to obtain   Past Medical History:  Diagnosis Date  . Chronic kidney disease (CKD), stage III (moderate) (HCC)   . Chronic systolic CHF (congestive heart failure) ([place])   . Complete heart block ([place])   . Coronary artery disease    status post [id number]  . Dementia ([place])   . Dyslipidemia   . Foley catheter in place   . Hypertension   . Ischemic cardiomyopathy    per last echo 11-10-2017  ef 15-20%  . Osteoarthritis   . Permanent atrial fibrillation   . Presence of permanent cardiac pacemaker cardiologist-  dr Samuel Bouche Jude status post generator replacement 2009 (original placement 02/ 1998)  . Renal artery stenosis (HCC)    95%  left, 50% right  . Requires assistance with activities of  daily living (ADL)   . 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 Type 2 diabetes mellitus ([place])   . Urinary retention   . Uses wheelchair    Past Surgical History:  Procedure Laterality Date  .  suspicous skin lesions-nose and upper back-removed 04/15/18    . CARDIOVASCULAR STRESS TEST  05-22-2012   dr Caryl Comes   Low risk adenosine no exercise nuclear study w/ no ischemia/  ef 31%,  LV moderately enlarged , global hypokinesis and severe hypokinesis of the inferior wall  . CORONARY ANGIOPLASTY WITH STENT PLACEMENT  spring 1998   stenting  . CORONARY ARTERY BYPASS GRAFT  fall 1998   x4  . INSERTION OF SUPRAPUBIC CATHETER N/A 04/16/2018   Procedure: INSERTION OF SUPRAPUBIC CATHETER;  Surgeon: Irine Seal, MD;  Location: [facility];  Service: Urology;  Laterality: N/A;  . PACEMAKER GENERATOR CHANGE  10-30-2007  dr Samuel Bouche Jude dual chamber  . PACEMAKER INSERTION  02/ 1998   dr Caryl Comes  . skin lesions    . TRANSTHORACIC ECHOCARDIOGRAM  11/10/2017   mild LVH, ef 15-20%, diffuse hypokinesis, LVDF not evaluated/  mild AS/  severe LAE/  RVSF severely reduced/  mild MR, PR, and TR/  PASP 53mmHg   Social History:  reports that he has never smoked. He has never used smokeless tobacco. He reports that he does not drink alcohol or use drugs. Patient lives at home with wife and caregiver  Allergies  Allergen Reactions  .  Cephalexin Other (See Comments)    hallucinations Other reaction(s): Delusions (intolerance)  . Penicillins Swelling and Rash    Itching Has patient had a PCN reaction causing immediate rash, facial/tongue/throat swelling, SOB or lightheadedness with hypotension: Yes Has patient had a PCN reaction causing severe rash involving mucus membranes or skin necrosis: Yes Has patient had a PCN reaction that required hospitalization: No Has patient had a PCN reaction occurring within the last 10 years: No If all of the above answers are "NO", then may proceed with Cephalosporin use.    Family History   Problem Relation Age of Onset  . Other Mother   . Cancer Mother        breast  . Alzheimer's disease Sister       Prior to Admission medications   Medication Sig Start Date End Date Taking? Authorizing Provider  albuterol (PROVENTIL) (2.5 MG/3ML) 0.083% nebulizer solution Take 3 mLs (2.5 mg total) by nebulization every 6 (six) hours as needed for wheezing or shortness of breath. 02/27/18  Yes Kathie Dike, MD  atorvastatin (LIPITOR) 40 MG tablet Take 40 mg by mouth daily at 12 noon.    Yes [provider]  benzonatate (TESSALON) 100 MG capsule Take 100 mg by mouth 3 (three) times daily as needed for cough.   Yes [provider]  dabigatran (PRADAXA) 75 MG CAPS capsule Take 75 mg by mouth daily.   Yes [provider]  feeding supplement, ENSURE ENLIVE, (ENSURE ENLIVE) LIQD Take 237 mLs by mouth 2 (two) times daily between meals. Patient taking differently: Take 237 mLs by mouth daily after breakfast.  12/18/17  Yes Shah, Pratik D, DO  FEROSUL 325 (65 Fe) MG tablet Take 325 mg by mouth daily.  03/14/18  Yes [provider]  furosemide (LASIX) 40 MG tablet Take 1 tablet (40 mg total) by mouth daily. 08/04/18  Yes Nanavati, Ankit, MD  glipiZIDE (GLUCOTROL XL) 2.5 MG 24 hr tablet Take 2.5-5 mg by mouth as directed. Depends on blood sugar. Checks twice daily. 03/12/18  Yes [provider]  lactobacillus acidophilus & bulgar (LACTINEX) chewable tablet Chew 2 tablets by mouth at bedtime. 08/10/18  Yes Dahal, Marlowe Aschoff, MD  levofloxacin (LEVAQUIN) 250 MG tablet Take 2 tablets (500 mg total) by mouth daily. 08/17/18  Yes Lacretia Leigh, MD  levothyroxine (SYNTHROID, LEVOTHROID) 25 MCG tablet Take 25 mcg by mouth daily at 12 noon.  04/13/18  Yes [provider]  Maltodextrin-Xanthan Gum (RESOURCE THICKENUP CLEAR) POWD Thicken fluids to nectar thick 02/27/18  Yes Memon, Jolaine Artist, MD  pantoprazole (PROTONIX) 40 MG tablet Take 40 mg by mouth daily at 12 noon.    Yes  [provider]  polyethylene glycol (MIRALAX) packet Take 17 g by mouth daily. Patient taking differently: Take 17 g by mouth daily as needed for moderate constipation.  03/03/18  Yes Caryl Ada K, PA-C  QUEtiapine (SEROQUEL) 50 MG tablet Take 50 mg by mouth at bedtime.   Yes [provider]  rOPINIRole (REQUIP) 0.5 MG tablet Take 1 mg by mouth at bedtime.    Yes [provider]  sulfamethoxazole-trimethoprim (BACTRIM,SEPTRA) 400-80 MG tablet Take 1 tablet by mouth at bedtime. 08/30/18  Yes [provider]    Physical Exam: BP 127/70   Pulse 71   Temp 100 F (37.8 C) (Rectal)   Resp 18   Ht 5\' 9"  (1.753 m)   Wt 69 kg   SpO2 99%   BMI 22.46 kg/m   . General:  Elderly Caucasian male. Awake and alert and oriented x3. No acute cardiopulmonary distress.  Marland Kitchen HEENT: Normocephalic atraumatic.  Right and left ears normal in appearance.  Pupils equal, round, reactive to light. Extraocular muscles are intact. Sclerae anicteric and noninjected.  Moist mucosal membranes. No mucosal lesions.  . Neck: Neck supple without lymphadenopathy. No carotid bruits. No masses palpated.  . Cardiovascular: Regular rate with normal S1-S2 sounds. No murmurs, rubs, gallops auscultated. No JVD.  Marland Kitchen Respiratory: Good respiratory rales in right base.  No wheezing.  No accessory muscle use. . Abdomen: Soft, nontender, nondistended. Active bowel sounds. No masses or hepatosplenomegaly  . Skin: No rashes, lesions, or ulcerations.  Dry, warm to touch. 2+ dorsalis pedis and radial pulses. . Musculoskeletal: No calf or leg pain. All major joints not erythematous nontender.  No upper or lower joint deformation.  Good ROM.  No contractures  . Psychiatric: Intact judgment and insight. Pleasant and cooperative. . Neurologic: No focal neurological deficits. Strength is 5/5 and symmetric in upper and lower extremities.  Cranial nerves II through XII are grossly intact.           Labs on  Admission: I have personally reviewed following labs and imaging studies  CBC: Recent Labs  Lab 09/20/18 2020  WBC 10.8*  NEUTROABS 9.0*  HGB 11.1*  HCT 33.9*  MCV 83.5  PLT 016   Basic Metabolic Panel: Recent Labs  Lab 09/20/18 2020  NA 135  K 4.0  CL 100  CO2 26  GLUCOSE 189*  BUN 41*  CREATININE 1.33*  CALCIUM 8.6*   GFR: Estimated Creatinine Clearance: 38.2 mL/min (A) (by C-G formula based on SCr of 1.33 mg/dL (H)). Liver Function Tests: No results for input(s): AST, ALT, ALKPHOS, BILITOT, PROT, ALBUMIN in the last 168 hours. No results for input(s): LIPASE, AMYLASE in the last 168 hours. No results for input(s): AMMONIA in the last 168 hours. Coagulation Profile: No results for input(s): INR, PROTIME in the last 168 hours. Cardiac Enzymes: Recent Labs  Lab 09/20/18 2020  TROPONINI 0.05*   BNP (last 3 results) No results for input(s): PROBNP in the last 8760 hours. HbA1C: No results for input(s): HGBA1C in the last 72 hours. CBG: No results for input(s): GLUCAP in the last 168 hours. Lipid Profile: No results for input(s): CHOL, HDL, LDLCALC, TRIG, CHOLHDL, LDLDIRECT in the last 72 hours. Thyroid Function Tests: No results for input(s): TSH, T4TOTAL, FREET4, T3FREE, THYROIDAB in the last 72 hours. Anemia Panel: No results for input(s): VITAMINB12, FOLATE, FERRITIN, TIBC, IRON, RETICCTPCT in the last 72 hours. Urine analysis:    Component Value Date/Time   COLORURINE YELLOW 09/20/2018 2020   APPEARANCEUR CLOUDY (A) 09/20/2018 2020   LABSPEC 1.013 09/20/2018 2020   PHURINE 5.0 09/20/2018 2020   GLUCOSEU NEGATIVE 09/20/2018 2020   HGBUR SMALL (A) 09/20/2018 2020   [place] NEGATIVE 09/20/2018 2020   [place] 09/20/2018 2020   PROTEINUR 30 (A) 09/20/2018 2020   NITRITE POSITIVE (A) 09/20/2018 2020   LEUKOCYTESUR LARGE (A) 09/20/2018 2020   Sepsis Labs: @LABRCNTIP (procalcitonin:4,lacticidven:4) ) Recent Results (from the past 240  hour(s))  Culture, blood (routine x 2)     Status: None (Preliminary result)   Collection Time: 09/20/18  9:45 PM  Result Value Ref Range Status   Specimen Description RIGHT ANTECUBITAL  Final   Special Requests   Final    BOTTLES DRAWN AEROBIC AND ANAEROBIC Blood Culture adequate volume Performed at [facility], [address] [place], [place] [address]  Culture PENDING  Incomplete   Report Status PENDING  Incomplete  Culture, blood (routine x 2)     Status: None (Preliminary result)   Collection Time: 09/20/18  9:46 PM  Result Value Ref Range Status   Specimen Description LEFT ANTECUBITAL  Final   Special Requests   Final    BOTTLES DRAWN AEROBIC AND ANAEROBIC Blood Culture adequate volume Performed at [facility], [address], [place], [place] [address]    Culture PENDING  Incomplete   Report Status PENDING  Incomplete     Radiological Exams on Admission: Dg Chest 2 View  Result Date: 09/20/2018 CLINICAL DATA:  Cough and fever. EXAM: CHEST - 2 VIEW COMPARISON:  Chest radiograph August 04, 2018 FINDINGS: Stable cardiomegaly. Calcified aortic arch. Status post median sternotomy for CABG with fractured mid sternotomy wire. Mild pulmonary vascular congestion without pleural effusion or focal consolidation. No pneumothorax. Dual lead LEFT cardiac pacemaker in situ. Soft tissue planes and included osseous structures are nonacute. IMPRESSION: 1. Stable cardiomegaly and mild pulmonary vascular congestion. 2.  Aortic Atherosclerosis (ICD10-I70.0). Electronically Signed   By: Elon Alas M.D.   On: 09/20/2018 19:58    EKG: Independently reviewed.  Paced rhythm  Assessment/Plan: Principal Problem:   Influenza B Active Problems:   Type 2 diabetes mellitus with hyperlipidemia (HCC)   Cardiomyopathy, ischemic   Permanent atrial fibrillation   HYPERTENSION, BENIGN   Pacemaker-St. Jude   Dementia (HCC)   CKD (chronic kidney disease) stage 3, GFR 30-59 ml/min (HCC)   UTI  (urinary tract infection)    This patient was discussed with the ED physician, including pertinent vitals, physical exam findings, labs, and imaging.  We also discussed care given by the ED provider.  1. Influenza B a. Admit b. Start Tamiflu c. Delorse Lek obtained in the ED d. CBC tomorrow e. Mucolytic's and Tylenol for fever 2. UTI a. Ciprofloxacin b. Culture obtained 3. Type 2 diabetes a. Hold oral hypoglycemic b. Sliding scale insulin 4. Hypertension a. Continue antihypertensives 5. Permanent atrial fibrillation a. Pacemaker b. Continue anticoagulation 6. Chronic kidney disease stage III a. Recheck creatinine in the morning after high duration 7. Dementia  DVT prophylaxis: On full anticoagulation Consultants: None Code Status: Partial code: No CPR or intubation.  I did discuss this with the patient's wife, who has medical power of attorney, that CPR is important for ACLS and that medications and shocks alone would not appropriately treat patients with fatal arrhythmias.  His wife understood and still desired for partial code Family Communication: Wife and caregiver Disposition Plan: Anticipate the patient needing at least 2 to 3 days for resolution and improvement.  Patient should be able to return home following admission at this point   Truett Mainland, DO

## 2018-09-20 NOTE — ED Notes (Signed)
Pt pulling at suprapubic catheter and IV. Softmits applied.

## 2018-09-21 ENCOUNTER — Other Ambulatory Visit: Payer: Self-pay

## 2018-09-21 DIAGNOSIS — I4821 Permanent atrial fibrillation: Secondary | ICD-10-CM

## 2018-09-21 DIAGNOSIS — E876 Hypokalemia: Secondary | ICD-10-CM

## 2018-09-21 DIAGNOSIS — T83510D Infection and inflammatory reaction due to cystostomy catheter, subsequent encounter: Secondary | ICD-10-CM

## 2018-09-21 DIAGNOSIS — N179 Acute kidney failure, unspecified: Secondary | ICD-10-CM

## 2018-09-21 DIAGNOSIS — N183 Chronic kidney disease, stage 3 (moderate): Secondary | ICD-10-CM

## 2018-09-21 DIAGNOSIS — N390 Urinary tract infection, site not specified: Secondary | ICD-10-CM

## 2018-09-21 LAB — GLUCOSE, CAPILLARY
Glucose-Capillary: 115 mg/dL — ABNORMAL HIGH (ref 70–99)
Glucose-Capillary: 134 mg/dL — ABNORMAL HIGH (ref 70–99)
Glucose-Capillary: 164 mg/dL — ABNORMAL HIGH (ref 70–99)
Glucose-Capillary: 228 mg/dL — ABNORMAL HIGH (ref 70–99)
Glucose-Capillary: 79 mg/dL (ref 70–99)

## 2018-09-21 LAB — CBC
HCT: 30.3 % — ABNORMAL LOW (ref 39.0–52.0)
HEMOGLOBIN: 9.7 g/dL — AB (ref 13.0–17.0)
MCH: 26.9 pg (ref 26.0–34.0)
MCHC: 32 g/dL (ref 30.0–36.0)
MCV: 84.2 fL (ref 80.0–100.0)
Platelets: 157 10*3/uL (ref 150–400)
RBC: 3.6 MIL/uL — ABNORMAL LOW (ref 4.22–5.81)
RDW: 17.1 % — ABNORMAL HIGH (ref 11.5–15.5)
WBC: 5.8 10*3/uL (ref 4.0–10.5)
nRBC: 0 % (ref 0.0–0.2)

## 2018-09-21 LAB — BASIC METABOLIC PANEL
Anion gap: 7 (ref 5–15)
BUN: 37 mg/dL — ABNORMAL HIGH (ref 8–23)
CHLORIDE: 103 mmol/L (ref 98–111)
CO2: 27 mmol/L (ref 22–32)
Calcium: 8.4 mg/dL — ABNORMAL LOW (ref 8.9–10.3)
Creatinine, Ser: 1.11 mg/dL (ref 0.61–1.24)
GFR calc non Af Amer: 59 mL/min — ABNORMAL LOW (ref >60)
Glucose, Bld: 97 mg/dL (ref 70–99)
Potassium: 3.4 mmol/L — ABNORMAL LOW (ref 3.5–5.1)
Sodium: 137 mmol/L (ref 135–145)

## 2018-09-21 NOTE — Progress Notes (Signed)
PROGRESS NOTE                                                                                                                                                                                                             Patient Demographics:    Neil [name], is a 85 y.o. male, DOB - [birth year], LTJ:[id number]  Admit date - 09/20/2018   Admitting Physician Truett Mainland, DO  Outpatient Primary MD for the patient is Redmond School, MD  LOS - 1  Outpatient Specialists: None  Chief Complaint  Patient presents with  . Cough  . Fever       Brief Narrative 85 year old male with severe dementia, hypertension, complete heart block, stage III chronic kidney disease, chronic suprapubic catheter with recurrent UTIs, permanent A. fib, ischemic cardiomyopathy who presented with pleuritic cough for past few weeks, placed on about 2 courses of antibiotic already including Zithromax and Levaquin presenting with persistent cough, shortness of breath and fever.  Patient lives with his wife who had URI symptoms (as per granddaughter at bedside she was tested positive for influenza A). In the ED he was positive for influenza B, WBC of 10.4K, BNP of 498 and normal lactic acid. UA was suggestive of UTI.  Admitted for further management.   Subjective:    Patient quite confused, in no acute distress  Assessment  & Plan :    Principal Problem:   Influenza B Reportedly his elderly wife tested positive for influenza A.  Continue supportive care with Tamiflu, PRN nebs and antitussives.   Active Problems: Catheter associated urinary tract infection. Has chronic suprapubic catheter with history of recurrent UTIs. UA suggestive of UTI.  Cultures pending.  On empiric Cipro.  Monitor sensitivity.  Granddaughter informs his catheter was changed last week (a change every 2 weeks at home).  Acute kidney injury on CKD (chronic kidney disease)  stage 3, GFR 30-59 ml/min (HCC) Baseline creatinine of 1.1.  Renal function now improved with hydration.  Discontinue fluids.  Hypokalemia Replenished     Type 2 diabetes mellitus with hyperlipidemia (HCC) Stable.  Monitor on sliding scale coverage.     Cardiomyopathy, ischemic Appears euvolemic.  Continue statin.    Permanent atrial fibrillation  Rate controlled.  Continue dabigatran.  Hypothyroidism Continue Synthroid  Severe dementia Was very confused this morning.  High risk for  sundowning.  Continue bedtime Seroquel.   Physical deconditioning PT evaluation.  Patient was unable to get up even for orthostasis on admission.    Code Status : Partial (DNI and no CPR but ACLS medication okay)  Family Communication  : Granddaughter at bedside  Disposition Plan  : Pending clinical improvement and PT evaluation  Barriers For Discharge : Active symptoms  Consults  : None  Procedures  : None  DVT Prophylaxis  : Pradaxa  Lab Results  Component Value Date   PLT 157 09/21/2018    Antibiotics  :    Anti-infectives (From admission, onward)   Start     Dose/Rate Route Frequency Ordered Stop   09/20/18 2230  ciprofloxacin (CIPRO) IVPB 400 mg     400 mg 200 mL/hr over 60 Minutes Intravenous Every 12 hours 09/20/18 2220     09/20/18 2200  oseltamivir (TAMIFLU) capsule 30 mg     30 mg Oral 2 times daily 09/20/18 2154 09/25/18 2159        Objective:   Vitals:   09/20/18 2230 09/20/18 2324 09/21/18 0533 09/21/18 0847  BP: (!) 128/41 123/90 111/63   Pulse: 79 72 78   Resp:  (!) 22 20   Temp:  99.1 F (37.3 C) (!) 97.5 F (36.4 C)   TempSrc:  Oral Oral   SpO2: 96% 100% 100% 98%  Weight:      Height:        Wt Readings from Last 3 Encounters:  09/20/18 69 kg  08/29/18 69 kg  08/08/18 69 kg     Intake/Output Summary (Last 24 hours) at 09/21/2018 1330 Last data filed at 09/21/2018 0500 Gross per 24 hour  Intake 336.37 ml  Output -  Net 336.37 ml      Physical Exam  Gen: not in distress, fatigued HEENT: Pallor present, moist mucosa, supple neck Chest: clear b/l, no added sounds CVS: N S1&S2, no murmurs, rubs or gallop GI: soft, NT, ND, BS+, chronic suprapubic catheter draining clear urine Musculoskeletal: warm, no edema CNS: AAOX1    Data Review:    CBC Recent Labs  Lab 09/20/18 2020 09/21/18 0709  WBC 10.8* 5.8  HGB 11.1* 9.7*  HCT 33.9* 30.3*  PLT 190 157  MCV 83.5 84.2  MCH 27.3 26.9  MCHC 32.7 32.0  RDW 17.1* 17.1*  LYMPHSABS 0.8  --   MONOABS 0.8  --   EOSABS 0.1  --   BASOSABS 0.0  --     Chemistries  Recent Labs  Lab 09/20/18 2020 09/21/18 0709  NA 135 137  K 4.0 3.4*  CL 100 103  CO2 26 27  GLUCOSE 189* 97  BUN 41* 37*  CREATININE 1.33* 1.11  CALCIUM 8.6* 8.4*   ------------------------------------------------------------------------------------------------------------------ No results for input(s): CHOL, HDL, LDLCALC, TRIG, CHOLHDL, LDLDIRECT in the last 72 hours.  No results found for: HGBA1C ------------------------------------------------------------------------------------------------------------------ No results for input(s): TSH, T4TOTAL, T3FREE, THYROIDAB in the last 72 hours.  Invalid input(s): FREET3 ------------------------------------------------------------------------------------------------------------------ No results for input(s): VITAMINB12, FOLATE, FERRITIN, TIBC, IRON, RETICCTPCT in the last 72 hours.  Coagulation profile No results for input(s): INR, PROTIME in the last 168 hours.  No results for input(s): DDIMER in the last 72 hours.  Cardiac Enzymes Recent Labs  Lab 09/20/18 2020  TROPONINI 0.05*   ------------------------------------------------------------------------------------------------------------------    Component Value Date/Time   BNP 498.0 (H) 09/20/2018 2020    Inpatient Medications  Scheduled Meds: . atorvastatin  40 mg Oral Q1200  .  dabigatran  75 mg Oral Daily  . feeding supplement (ENSURE ENLIVE)  237 mL Oral BID BM  . ferrous sulfate  325 mg Oral Daily  . guaiFENesin  600 mg Oral BID  . insulin aspart  0-15 Units Subcutaneous TID WC  . insulin aspart  0-5 Units Subcutaneous QHS  . levothyroxine  25 mcg Oral Q1200  . oseltamivir  30 mg Oral BID  . pantoprazole  40 mg Oral Q1200  . QUEtiapine  50 mg Oral QHS  . rOPINIRole  1 mg Oral QHS   Continuous Infusions: . sodium chloride 75 mL/hr at 09/20/18 2217  . ciprofloxacin 400 mg (09/21/18 1043)   PRN Meds:.acetaminophen, albuterol, ondansetron **OR** ondansetron (ZOFRAN) IV, polyethylene glycol  Micro Results Recent Results (from the past 240 hour(s))  Culture, blood (routine x 2)     Status: None (Preliminary result)   Collection Time: 09/20/18  9:45 PM  Result Value Ref Range Status   Specimen Description RIGHT ANTECUBITAL  Final   Special Requests   Final    BOTTLES DRAWN AEROBIC AND ANAEROBIC Blood Culture adequate volume   Culture   Final    NO GROWTH < 12 HOURS Performed at [facility], [address] [place], [place] [address]    Report Status PENDING  Incomplete  Culture, blood (routine x 2)     Status: None (Preliminary result)   Collection Time: 09/20/18  9:46 PM  Result Value Ref Range Status   Specimen Description LEFT ANTECUBITAL  Final   Special Requests   Final    BOTTLES DRAWN AEROBIC AND ANAEROBIC Blood Culture adequate volume   Culture   Final    NO GROWTH < 12 HOURS Performed at [facility], [address], [place],  [address]    Report Status PENDING  Incomplete    Radiology Reports Dg Chest 2 View  Result Date: 09/20/2018 CLINICAL DATA:  Cough and fever. EXAM: CHEST - 2 VIEW COMPARISON:  Chest radiograph August 04, 2018 FINDINGS: Stable cardiomegaly. Calcified aortic arch. Status post median sternotomy for CABG with fractured mid sternotomy wire. Mild pulmonary vascular congestion without pleural effusion or focal  consolidation. No pneumothorax. Dual lead LEFT cardiac pacemaker in situ. Soft tissue planes and included osseous structures are nonacute. IMPRESSION: 1. Stable cardiomegaly and mild pulmonary vascular congestion. 2.  Aortic Atherosclerosis (ICD10-I70.0). Electronically Signed   By: Elon Alas M.D.   On: 09/20/2018 19:58    Time Spent in minutes  35   Lameshia Hypolite M.D on 09/21/2018 at 1:30 PM  Between 7am to 7pm - Pager - [phone]  After 7pm go to [email or web address] - password [facility]  [facility] -  Office  [phone]

## 2018-09-21 NOTE — Progress Notes (Signed)
Patient arrived to floor with wife and niece present.  Both present to the floor with cough, although both wore face masks.  Explained to family members that during this current flu season, with heightened awareness around respiratory symptoms that it may be safer for family members and staff for family to leave at this time.  Both family members refused. Both family members did agree to wear face mask for duration of patient stay and to stay in patient room with door closed.

## 2018-09-22 LAB — GLUCOSE, CAPILLARY
Glucose-Capillary: 114 mg/dL — ABNORMAL HIGH (ref 70–99)
Glucose-Capillary: 227 mg/dL — ABNORMAL HIGH (ref 70–99)
Glucose-Capillary: 108 mg/dL — ABNORMAL HIGH (ref 70–99)
Glucose-Capillary: 215 mg/dL — ABNORMAL HIGH (ref 70–99)

## 2018-09-22 LAB — BASIC METABOLIC PANEL
Anion gap: 7 (ref 5–15)
BUN: 36 mg/dL — ABNORMAL HIGH (ref 8–23)
CHLORIDE: 104 mmol/L (ref 98–111)
CO2: 24 mmol/L (ref 22–32)
Calcium: 8.2 mg/dL — ABNORMAL LOW (ref 8.9–10.3)
Creatinine, Ser: 1.54 mg/dL — ABNORMAL HIGH (ref 0.61–1.24)
GFR calc Af Amer: 46 mL/min — ABNORMAL LOW (ref >60)
GFR calc non Af Amer: 40 mL/min — ABNORMAL LOW (ref >60)
Glucose, Bld: 138 mg/dL — ABNORMAL HIGH (ref 70–99)
Potassium: 3.5 mmol/L (ref 3.5–5.1)
Sodium: 135 mmol/L (ref 135–145)

## 2018-09-22 MED ORDER — LOPERAMIDE HCL 2 MG PO CAPS
2.0000 mg | ORAL_CAPSULE | ORAL | Status: DC | PRN
  Administered 2018-09-22 (×2): 2 mg via ORAL
  Filled 2018-09-22 (×2): qty 1

## 2018-09-22 MED ORDER — HALOPERIDOL LACTATE 5 MG/ML IJ SOLN
0.5000 mg | Freq: Four times a day (QID) | INTRAMUSCULAR | Status: DC | PRN
  Administered 2018-09-22 – 2018-09-26 (×7): 0.5 mg via INTRAVENOUS
  Filled 2018-09-22 (×8): qty 1

## 2018-09-22 MED ORDER — FLUCONAZOLE 100 MG PO TABS
100.0000 mg | ORAL_TABLET | Freq: Every day | ORAL | Status: DC
  Administered 2018-09-22 – 2018-09-25 (×4): 100 mg via ORAL
  Filled 2018-09-22 (×4): qty 1

## 2018-09-22 MED ORDER — POTASSIUM CHLORIDE CRYS ER 20 MEQ PO TBCR
40.0000 meq | EXTENDED_RELEASE_TABLET | Freq: Once | ORAL | Status: AC
  Administered 2018-09-22: 40 meq via ORAL
  Filled 2018-09-22: qty 2

## 2018-09-22 MED ORDER — DIAZEPAM 2 MG PO TABS: 2.0000 mg | ORAL_TABLET | Freq: Three times a day (TID) | ORAL | Status: DC

## 2018-09-22 NOTE — Progress Notes (Signed)
Sitter stated that patient at risk of pulling out IV.  Mitts placed.  Sitter at bedside.

## 2018-09-22 NOTE — Progress Notes (Signed)
Has had multiple loose bms today with small amount of mucus and blood.  Also, patient became restless and tried to get out of bed to go home.  Texted Dr. Clementeen Graham with this information and received order for immodium and haldol.  Private sitters have been at bedside today.

## 2018-09-22 NOTE — Progress Notes (Signed)
Sitter requested cough medicine stating patient had not received any .  Patient not coughing much and was explained that he had received his ordered mucinex and tamiflu.   Sitter, also, said patient trying to get out of bed.  Talked to Dr. Clementeen Graham and he said to go ahead and give another dose of haldol a little early and to give his seroquel at 1800

## 2018-09-22 NOTE — Progress Notes (Addendum)
PROGRESS NOTE                                                                                                                                                                                                             Patient Demographics:    Neil [name], is a 85 y.o. male, DOB - [birth year], GQB:[id number]  Admit date - 09/20/2018   Admitting Physician Truett Mainland, DO  Outpatient Primary MD for the patient is Redmond School, MD  LOS - 2  Outpatient Specialists: None  Chief Complaint  Patient presents with  . Cough  . Fever       Brief Narrative 85 year old male with severe dementia, hypertension, complete heart block, stage III chronic kidney disease, chronic suprapubic catheter with recurrent UTIs, permanent A. fib, ischemic cardiomyopathy who presented with pleuritic cough for past few weeks, placed on about 2 courses of antibiotic already including Zithromax and Levaquin presenting with persistent cough, shortness of breath and fever.  Patient lives with his wife who had URI symptoms (as per granddaughter at bedside she was tested positive for influenza A). In the ED he was positive for influenza B, WBC of 10.4K, BNP of 498 and normal lactic acid. UA was suggestive of UTI.  Admitted for further management.   Subjective:    Per granddaughter at bedside was confused last night as well.  Appears more comfortable this morning.  Assessment  & Plan :    Principal Problem:   Influenza B Reportedly his elderly wife tested positive for influenza A.  Continue supportive care with Tamiflu, PRN nebs and antitussives.   Active Problems: Catheter associated urinary tract infection. Has chronic suprapubic catheter with history of recurrent UTIs. UA suggestive of UTI.  Cultures growing yeast.  Will discontinue Cipro and placed on fluconazole.  Granddaughter informs his catheter was changed last week (a change every 2  weeks at home).  Acute kidney injury on CKD (chronic kidney disease) stage 3, GFR 30-59 ml/min (HCC) Baseline creatinine of 1.1.  Renal function had improved with fluids but worsened again.  Discontinue Cipro.  Placed back on gentle hydration.  Hypokalemia Replenished     Type 2 diabetes mellitus with hyperlipidemia (HCC) Stable.  Monitor on sliding scale coverage.     Cardiomyopathy, ischemic Appears euvolemic.  Continue statin.    Permanent atrial fibrillation  Rate  controlled.  Continue dabigatran.  Hypothyroidism Continue Synthroid  Severe dementia Was very confused this morning.  High risk for sundowning.  Continue bedtime Seroquel.   Physical deconditioning PT evaluation.  Patient was unable to get up even for orthostasis on admission.    Code Status : Partial (DNI and no CPR but ACLS medication okay)  Family Communication  : Granddaughter at bedside  Disposition Plan  : Pending clinical improvement and PT evaluation.  Can be discharged in the next 24-48 hours if continues to improve  Barriers For Discharge : Active symptoms  Consults  : None  Procedures  : None  DVT Prophylaxis  : Pradaxa  Lab Results  Component Value Date   PLT 157 09/21/2018    Antibiotics  :    Anti-infectives (From admission, onward)   Start     Dose/Rate Route Frequency Ordered Stop   09/20/18 2230  ciprofloxacin (CIPRO) IVPB 400 mg     400 mg 200 mL/hr over 60 Minutes Intravenous Every 12 hours 09/20/18 2220     09/20/18 2200  oseltamivir (TAMIFLU) capsule 30 mg     30 mg Oral 2 times daily 09/20/18 2154 09/25/18 2159        Objective:   Vitals:   09/21/18 0847 09/21/18 1459 09/21/18 2047 09/22/18 0408  BP:  113/69 121/66 122/69  Pulse:  77 76 77  Resp:  16    Temp:  (!) 97.5 F (36.4 C) (!) 97.5 F (36.4 C) 98.1 F (36.7 C)  TempSrc:  Oral Oral Oral  SpO2: 98% 98% 100% 99%  Weight:      Height:        Wt Readings from Last 3 Encounters:  09/20/18 69 kg   08/29/18 69 kg  08/08/18 69 kg     Intake/Output Summary (Last 24 hours) at 09/22/2018 1118 Last data filed at 09/22/2018 0700 Gross per 24 hour  Intake -  Output 700 ml  Net -700 ml    Physical exam Not in distress HEENT: Pallor present, moist mucosa, supple neck Chest: Clear bilaterally CVs: Normal S1-S2, no murmurs GI: Soft, nondistended nontender, chronic suprapubic catheter with clear urine Musculoskeletal: Warm, no edema     Data Review:    CBC Recent Labs  Lab 09/20/18 2020 09/21/18 0709  WBC 10.8* 5.8  HGB 11.1* 9.7*  HCT 33.9* 30.3*  PLT 190 157  MCV 83.5 84.2  MCH 27.3 26.9  MCHC 32.7 32.0  RDW 17.1* 17.1*  LYMPHSABS 0.8  --   MONOABS 0.8  --   EOSABS 0.1  --   BASOSABS 0.0  --     Chemistries  Recent Labs  Lab 09/20/18 2020 09/21/18 0709 09/22/18 0703  NA 135 137 135  K 4.0 3.4* 3.5  CL 100 103 104  CO2 26 27 24   GLUCOSE 189* 97 138*  BUN 41* 37* 36*  CREATININE 1.33* 1.11 1.54*  CALCIUM 8.6* 8.4* 8.2*   ------------------------------------------------------------------------------------------------------------------ No results for input(s): CHOL, HDL, LDLCALC, TRIG, CHOLHDL, LDLDIRECT in the last 72 hours.  No results found for: HGBA1C ------------------------------------------------------------------------------------------------------------------ No results for input(s): TSH, T4TOTAL, T3FREE, THYROIDAB in the last 72 hours.  Invalid input(s): FREET3 ------------------------------------------------------------------------------------------------------------------ No results for input(s): VITAMINB12, FOLATE, FERRITIN, TIBC, IRON, RETICCTPCT in the last 72 hours.  Coagulation profile No results for input(s): INR, PROTIME in the last 168 hours.  No results for input(s): DDIMER in the last 72 hours.  Cardiac Enzymes Recent Labs  Lab 09/20/18 2020  TROPONINI 0.05*    ------------------------------------------------------------------------------------------------------------------  Component Value Date/Time   BNP 498.0 (H) 09/20/2018 2020    Inpatient Medications  Scheduled Meds: . atorvastatin  40 mg Oral Q1200  . dabigatran  75 mg Oral Daily  . feeding supplement (ENSURE ENLIVE)  237 mL Oral BID BM  . ferrous sulfate  325 mg Oral Daily  . guaiFENesin  600 mg Oral BID  . insulin aspart  0-15 Units Subcutaneous TID WC  . insulin aspart  0-5 Units Subcutaneous QHS  . levothyroxine  25 mcg Oral Q1200  . oseltamivir  30 mg Oral BID  . pantoprazole  40 mg Oral Q1200  . QUEtiapine  50 mg Oral QHS  . rOPINIRole  1 mg Oral QHS   Continuous Infusions: . sodium chloride 75 mL/hr at 09/20/18 2217  . ciprofloxacin 400 mg (09/22/18 0854)   PRN Meds:.acetaminophen, albuterol, ondansetron **OR** ondansetron (ZOFRAN) IV, polyethylene glycol  Micro Results Recent Results (from the past 240 hour(s))  Culture, blood (routine x 2)     Status: None (Preliminary result)   Collection Time: 09/20/18  9:45 PM  Result Value Ref Range Status   Specimen Description RIGHT ANTECUBITAL  Final   Special Requests   Final    BOTTLES DRAWN AEROBIC AND ANAEROBIC Blood Culture adequate volume   Culture   Final    NO GROWTH < 12 HOURS Performed at [facility], [address] [place], [place] [address]    Report Status PENDING  Incomplete  Culture, blood (routine x 2)     Status: None (Preliminary result)   Collection Time: 09/20/18  9:46 PM  Result Value Ref Range Status   Specimen Description LEFT ANTECUBITAL  Final   Special Requests   Final    BOTTLES DRAWN AEROBIC AND ANAEROBIC Blood Culture adequate volume   Culture   Final    NO GROWTH < 12 HOURS Performed at [facility], [address], [place], [place] [address]    Report Status PENDING  Incomplete    Radiology Reports Dg Chest 2 View  Result Date: 09/20/2018 CLINICAL DATA:  Cough and fever. EXAM:  CHEST - 2 VIEW COMPARISON:  Chest radiograph August 04, 2018 FINDINGS: Stable cardiomegaly. Calcified aortic arch. Status post median sternotomy for CABG with fractured mid sternotomy wire. Mild pulmonary vascular congestion without pleural effusion or focal consolidation. No pneumothorax. Dual lead LEFT cardiac pacemaker in situ. Soft tissue planes and included osseous structures are nonacute. IMPRESSION: 1. Stable cardiomegaly and mild pulmonary vascular congestion. 2.  Aortic Atherosclerosis (ICD10-I70.0). Electronically Signed   By: Elon Alas M.D.   On: 09/20/2018 19:58    Time Spent in minutes  25   Mazey Mantell M.D on 09/22/2018 at 11:18 AM  Between 7am to 7pm - Pager - [phone]  After 7pm go to [email or web address] - password [email or web address]  [facility] -  Office  [phone]

## 2018-09-23 ENCOUNTER — Inpatient Hospital Stay (HOSPITAL_COMMUNITY): Payer: Medicare Other

## 2018-09-23 DIAGNOSIS — I10 Essential (primary) hypertension: Secondary | ICD-10-CM

## 2018-09-23 DIAGNOSIS — F0390 Unspecified dementia without behavioral disturbance: Secondary | ICD-10-CM

## 2018-09-23 DIAGNOSIS — R05 Cough: Secondary | ICD-10-CM

## 2018-09-23 DIAGNOSIS — E1169 Type 2 diabetes mellitus with other specified complication: Secondary | ICD-10-CM

## 2018-09-23 DIAGNOSIS — E785 Hyperlipidemia, unspecified: Secondary | ICD-10-CM

## 2018-09-23 LAB — GLUCOSE, CAPILLARY
GLUCOSE-CAPILLARY: 175 mg/dL — AB (ref 70–99)
GLUCOSE-CAPILLARY: 173 mg/dL — AB (ref 70–99)
GLUCOSE-CAPILLARY: 117 mg/dL — AB (ref 70–99)
Glucose-Capillary: 123 mg/dL — ABNORMAL HIGH (ref 70–99)

## 2018-09-23 LAB — URINE CULTURE: Culture: >=100000 — AB

## 2018-09-23 LAB — BASIC METABOLIC PANEL
Anion gap: 10 (ref 5–15)
BUN: 42 mg/dL — ABNORMAL HIGH (ref 8–23)
CO2: 23 mmol/L (ref 22–32)
Calcium: 8.7 mg/dL — ABNORMAL LOW (ref 8.9–10.3)
Chloride: 104 mmol/L (ref 98–111)
Creatinine, Ser: 1.93 mg/dL — ABNORMAL HIGH (ref 0.61–1.24)
GFR calc Af Amer: 35 mL/min — ABNORMAL LOW (ref >60)
GFR calc non Af Amer: 30 mL/min — ABNORMAL LOW (ref >60)
Glucose, Bld: 147 mg/dL — ABNORMAL HIGH (ref 70–99)
POTASSIUM: 4.4 mmol/L (ref 3.5–5.1)
Sodium: 137 mmol/L (ref 135–145)

## 2018-09-23 MED ORDER — OSELTAMIVIR PHOSPHATE 30 MG PO CAPS
30.0000 mg | ORAL_CAPSULE | Freq: Every day | ORAL | Status: AC
  Administered 2018-09-24 – 2018-09-25 (×2): 30 mg via ORAL
  Filled 2018-09-23 (×2): qty 1

## 2018-09-23 MED ORDER — SODIUM CHLORIDE 0.9 % IV SOLN
1.0000 g | INTRAVENOUS | Status: DC
  Administered 2018-09-23 – 2018-09-27 (×5): 1 g via INTRAVENOUS
  Filled 2018-09-23 (×7): qty 10

## 2018-09-23 NOTE — Progress Notes (Signed)
PROGRESS NOTE                                                                                                                                                                                                             Patient Demographics:    Neil [name], is a 85 y.o. male, DOB - Aug 03, 1928, [id number]:[id number]  Admit date - 09/20/2018   Admitting Physician Truett Mainland, DO  Outpatient Primary MD for the patient is [employer], MD  LOS - 3  Outpatient Specialists: None  Chief Complaint  Patient presents with  . Cough  . Fever       Brief Narrative 85 year old male with severe dementia, hypertension, complete heart block, stage III chronic kidney disease, chronic suprapubic catheter with recurrent UTIs, permanent A. fib, ischemic cardiomyopathy who presented with pleuritic cough for past few weeks, placed on about 2 courses of antibiotic already including Zithromax and Levaquin presenting with persistent cough, shortness of breath and fever.  Patient lives with his wife who had URI symptoms (as per granddaughter at bedside she was tested positive for influenza A). In the ED he was positive for influenza B, WBC of 10.4K, BNP of 498 and normal lactic acid. UA was suggestive of UTI.  Admitted for further management.   Subjective:   Patient is resting in bed.  Family notes that he has been coughing more and may have aspirated on some Ensure.  Requesting chest x-ray be performed.  Assessment  & Plan :    Principal Problem:   Influenza B Reportedly his elderly wife tested positive for influenza A.  Continue supportive care with Tamiflu, PRN nebs and antitussives.   Active Problems: Catheter associated urinary tract infection. Has chronic suprapubic catheter with history of recurrent UTIs. UA suggestive of UTI.  Cultures growing yeast.  Will discontinue Cipro and placed on fluconazole.  Granddaughter informs his  catheter was changed last week (a change every 2 weeks at home).  Acute kidney injury on CKD (chronic kidney disease) stage 3, GFR 30-59 ml/min (HCC) Baseline creatinine of 1.1.  Creatinine continues to trend up.  Will check renal ultrasound.  He is having some urine output.  Family reports no urine and suprapubic catheter as well as passing through his urethra.  He was previously on ciprofloxacin which was has been discontinued.  Hypokalemia Replenished  Type 2 diabetes mellitus with hyperlipidemia (HCC) Stable.  Monitor on sliding scale coverage.     Cardiomyopathy, ischemic Appears euvolemic.  Continue statin.    Permanent atrial fibrillation  Rate controlled.  Continue dabigatran.  Hypothyroidism Continue Synthroid  Severe dementia Has some issues with sundowning overnight.  Is calm at this time.   Physical deconditioning Family is not interested in having patient placed.  They are requesting home health physical therapy.    Code Status : Partial (DNI and no CPR but ACLS medication okay)  Family Communication  : Granddaughter at bedside  Disposition Plan  : Family wishes to take the patient home on discharge.  Can consider discharge once renal function starts to improve  Barriers For Discharge : Active symptoms, worsening creatinine  Consults  : None  Procedures  : None  DVT Prophylaxis  : Pradaxa  Lab Results  Component Value Date   PLT 157 09/21/2018    Antibiotics  :    Anti-infectives (From admission, onward)   Start     Dose/Rate Route Frequency Ordered Stop   09/24/18 1000  oseltamivir (TAMIFLU) capsule 30 mg     30 mg Oral Daily 09/23/18 0835 09/26/18 0959   09/23/18 1100  cefTRIAXone (ROCEPHIN) 1 g in sodium chloride 0.9 % 100 mL IVPB     1 g 200 mL/hr over 30 Minutes Intravenous Every 24 hours 09/23/18 1036     09/22/18 1200  fluconazole (DIFLUCAN) tablet 100 mg     100 mg Oral Daily 09/22/18 1122     09/20/18 2230  ciprofloxacin (CIPRO)  IVPB 400 mg  Status:  Discontinued     400 mg 200 mL/hr over 60 Minutes Intravenous Every 12 hours 09/20/18 2220 09/22/18 1122   09/20/18 2200  oseltamivir (TAMIFLU) capsule 30 mg  Status:  Discontinued     30 mg Oral 2 times daily 09/20/18 2154 09/23/18 0835        Objective:   Vitals:   09/22/18 1438 09/22/18 2114 09/23/18 0554 09/23/18 1401  BP: (!) 151/78 (!) 154/65 (!) 158/79 (!) 147/88  Pulse: 68 82 77 68  Resp:    20  Temp: 98.1 F (36.7 C) (!) 97.3 F (36.3 C) (!) 97.3 F (36.3 C)   TempSrc: Oral Oral Oral   SpO2: 99% 97%  94%  Weight:      Height:        Wt Readings from Last 3 Encounters:  09/20/18 69 kg  08/29/18 69 kg  08/08/18 69 kg     Intake/Output Summary (Last 24 hours) at 09/23/2018 2011 Last data filed at 09/23/2018 1815 Gross per 24 hour  Intake 340 ml  Output 2250 ml  Net -1910 ml    Physical exam General exam: Alert, awake, no distress Respiratory system: Scattered rhonchi. Respiratory effort normal. Cardiovascular system:RRR. No murmurs, rubs, gallops. Gastrointestinal system: Abdomen is nondistended, soft and nontender. No organomegaly or masses felt. Normal bowel sounds heard. Central nervous system: No focal neurological deficits. Extremities: No C/C/E, +pedal pulses Skin: No rashes, lesions or ulcers Psychiatry: pleasant, cooperative    Data Review:    CBC Recent Labs  Lab 09/20/18 2020 09/21/18 0709  WBC 10.8* 5.8  HGB 11.1* 9.7*  HCT 33.9* 30.3*  PLT 190 157  MCV 83.5 84.2  MCH 27.3 26.9  MCHC 32.7 32.0  RDW 17.1* 17.1*  LYMPHSABS 0.8  --   MONOABS 0.8  --   EOSABS 0.1  --   BASOSABS 0.0  --  Chemistries  Recent Labs  Lab 09/20/18 2020 09/21/18 0709 09/22/18 0703 09/23/18 0651  NA 135 137 135 137  K 4.0 3.4* 3.5 4.4  CL 100 103 104 104  CO2 26 27 24 23   GLUCOSE 189* 97 138* 147*  BUN 41* 37* 36* 42*  CREATININE 1.33* 1.11 1.54* 1.93*  CALCIUM 8.6* 8.4* 8.2* 8.7*    ------------------------------------------------------------------------------------------------------------------ No results for input(s): CHOL, HDL, LDLCALC, TRIG, CHOLHDL, LDLDIRECT in the last 72 hours.  No results found for: HGBA1C ------------------------------------------------------------------------------------------------------------------ No results for input(s): TSH, T4TOTAL, T3FREE, THYROIDAB in the last 72 hours.  Invalid input(s): FREET3 ------------------------------------------------------------------------------------------------------------------ No results for input(s): VITAMINB12, FOLATE, FERRITIN, TIBC, IRON, RETICCTPCT in the last 72 hours.  Coagulation profile No results for input(s): INR, PROTIME in the last 168 hours.  No results for input(s): DDIMER in the last 72 hours.  Cardiac Enzymes Recent Labs  Lab 09/20/18 2020  TROPONINI 0.05*   ------------------------------------------------------------------------------------------------------------------    Component Value Date/Time   BNP 498.0 (H) 09/20/2018 2020    Inpatient Medications  Scheduled Meds: . atorvastatin  40 mg Oral Q1200  . dabigatran  75 mg Oral Daily  . feeding supplement (ENSURE ENLIVE)  237 mL Oral BID BM  . ferrous sulfate  325 mg Oral Daily  . fluconazole  100 mg Oral Daily  . guaiFENesin  600 mg Oral BID  . insulin aspart  0-15 Units Subcutaneous TID WC  . insulin aspart  0-5 Units Subcutaneous QHS  . levothyroxine  25 mcg Oral Q1200  . [START ON 09/24/2018] oseltamivir  30 mg Oral Daily  . pantoprazole  40 mg Oral Q1200  . QUEtiapine  50 mg Oral QHS  . rOPINIRole  1 mg Oral QHS   Continuous Infusions: . sodium chloride 75 mL/hr at 09/22/18 1842  . cefTRIAXone (ROCEPHIN)  IV 1 g (09/23/18 1205)   PRN Meds:.acetaminophen, albuterol, haloperidol lactate, loperamide, ondansetron **OR** ondansetron (ZOFRAN) IV, polyethylene glycol  Micro Results Recent Results (from the  past 240 hour(s))  Urine culture     Status: Abnormal   Collection Time: 09/20/18  8:20 PM  Result Value Ref Range Status   Specimen Description   Final    URINE, CATHETERIZED Performed at [facility], [address] [place], [place] [address]    Special Requests   Final    NONE Performed at [facility], [address] [place], [place] [address]    Culture >=100,000 COLONIES/mL ESCHERICHIA COLI (A)  Final   Report Status 09/23/2018 FINAL  Final   Organism ID, Bacteria ESCHERICHIA COLI (A)  Final      Susceptibility   Escherichia coli - MIC*    AMPICILLIN >=32 RESISTANT Resistant     CEFAZOLIN <=4 SENSITIVE Sensitive     CEFTRIAXONE <=1 SENSITIVE Sensitive     CIPROFLOXACIN >=4 RESISTANT Resistant     GENTAMICIN <=1 SENSITIVE Sensitive     IMIPENEM <=0.25 SENSITIVE Sensitive     NITROFURANTOIN <=16 SENSITIVE Sensitive     TRIMETH/SULFA >=320 RESISTANT Resistant     AMPICILLIN/SULBACTAM 16 INTERMEDIATE Intermediate     PIP/TAZO <=4 SENSITIVE Sensitive     Extended ESBL NEGATIVE Sensitive     * >=100,000 COLONIES/mL ESCHERICHIA COLI  Culture, blood (routine x 2)     Status: None (Preliminary result)   Collection Time: 09/20/18  9:45 PM  Result Value Ref Range Status   Specimen Description RIGHT ANTECUBITAL  Final   Special Requests   Final    BOTTLES DRAWN AEROBIC AND  ANAEROBIC Blood Culture adequate volume   Culture   Final    NO GROWTH 3 DAYS Performed at [facility], [address] [place], [place] [address]    Report Status PENDING  Incomplete  Culture, blood (routine x 2)     Status: None (Preliminary result)   Collection Time: 09/20/18  9:46 PM  Result Value Ref Range Status   Specimen Description LEFT ANTECUBITAL  Final   Special Requests   Final    BOTTLES DRAWN AEROBIC AND ANAEROBIC Blood Culture adequate volume   Culture   Final    NO GROWTH 3 DAYS Performed at [facility], [address] [place], [place] [address]    Report Status PENDING  Incomplete     Radiology Reports Dg Chest 2 View  Result Date: 09/20/2018 CLINICAL DATA:  Cough and fever. EXAM: CHEST - 2 VIEW COMPARISON:  Chest radiograph August 04, 2018 FINDINGS: Stable cardiomegaly. Calcified aortic arch. Status post median sternotomy for CABG with fractured mid sternotomy wire. Mild pulmonary vascular congestion without pleural effusion or focal consolidation. No pneumothorax. Dual lead LEFT cardiac pacemaker in situ. Soft tissue planes and included osseous structures are nonacute. IMPRESSION: 1. Stable cardiomegaly and mild pulmonary vascular congestion. 2.  Aortic Atherosclerosis (ICD10-I70.0). Electronically Signed   By: Elon Alas M.D.   On: 09/20/2018 19:58   US Renal  Result Date: 09/23/2018 CLINICAL DATA:  85 year old male with acute kidney insufficiency. History kidney cyst. Subsequent encounter. EXAM: RENAL / URINARY TRACT ULTRASOUND COMPLETE COMPARISON:  08/08/2018 CT. FINDINGS: Evaluation particularly portions of right kidney limited by habitus/bowel gas. Right Kidney: Renal measurements: 9.7 x 3.8 x 4.6 cm = volume: 89.2 ML. Echogenicity within normal limits. No hydronephrosis. 1.4 cm cyst. Left Kidney: Renal measurements: 12 x 4.5 x 4.8 cm = volume: 136.5 mL. Echogenicity within normal limits. Renal parenchymal thinning. 5.6 cm 1.9 cm cyst. No hydronephrosis. Bladder: Catheter in place. IMPRESSION: 1. No hydronephrosis. 2. Left renal parenchymal thinning. 3. Bilateral renal cysts larger on the left. 4. Urinary bladder catheter in place.  Decompressed bladder. Electronically Signed   By: Genia Del M.D.   On: 09/23/2018 15:18   Dg Chest Port 1 View  Result Date: 09/23/2018 CLINICAL DATA:  Cough with weakness. EXAM: PORTABLE CHEST 1 VIEW COMPARISON:  09/20/2018 FINDINGS: Sequelae of CABG are again identified. A dual chamber pacemaker remains in place. The cardiac silhouette remains enlarged. There is increased pulmonary vascular congestion with new patchy airspace opacity  in both lung bases. No sizable pleural effusion or pneumothorax is identified. No acute osseous abnormality is seen. IMPRESSION: Cardiomegaly with increased pulmonary vascular congestion and patchy bibasilar opacities which may reflect edema, pneumonia, or atelectasis. Electronically Signed   By: Logan Bores M.D.   On: 09/23/2018 16:47    Time Spent in minutes  25   Kathie Dike M.D on 09/23/2018 at 8:11 PM  Between 7am to 7pm   After 7pm go to [email or web address]   [facility] -  Office  [phone]

## 2018-09-23 NOTE — TOC Initial Note (Signed)
Transition of Care [facility]) - Initial/Assessment Note    Patient Details  Name: Neil [name] MRN: [id number] Date of Birth: 1928/11/19  Transition of Care [facility]) CM/SW Contact:    Sherald Barge, RN Phone Number: 09/23/2018, 1:09 PM  Clinical Narrative:     Granddaughter at bedside, anxious and adamant that pt be discharged today, concerns about care provided over weekend by hospital staff. Concerned about potential aspiration this AM. CM requested chest x-ray prior to MD visit so total picture can be discussed. Active with [place]. [facility] rep, aware of admission. Pt will need orders to resume services.             Expected Discharge Plan: [place]     Patient Goals and CMS Choice Patient states their goals for this hospitalization and ongoing recovery are:: get out of the hospital CMS Medicare.gov Compare Post Acute Care list provided to:: Patient Represenative (must comment)(granddaughter at bedside)    Expected Discharge Plan and Services Expected Discharge Plan: [place] Discharge Planning Services: Follow-up appt scheduled   Living arrangements for the past 2 months: [place]: RN Heath Agency: [place] ([facility])  Prior Living Arrangements/Services Living arrangements for the past 2 months: Single Family Home Lives with:: Spouse Patient language and need for interpreter reviewed:: No Do you feel safe going back to the place where you live?: Yes      Need for Family Participation in Patient Care: Yes (Comment) Care giver support system in place?: Yes (comment) Current home services: Home RN, Homehealth aide Criminal Activity/Legal Involvement Pertinent to Current Situation/Hospitalization: No - Comment as needed  Activities of Daily Living Home Assistive Devices/Equipment: Walker (specify type), Grab bars around toilet, CBG Meter, Blood pressure cuff ADL Screening  (condition at time of admission) Patient's cognitive ability adequate to safely complete daily activities?: No Is the patient deaf or have difficulty hearing?: No Does the patient have difficulty seeing, even when wearing glasses/contacts?: No Does the patient have difficulty concentrating, remembering, or making decisions?: Yes Patient able to express need for assistance with ADLs?: No Does the patient have difficulty dressing or bathing?: Yes Independently performs ADLs?: No Communication: Independent Dressing (OT): Needs assistance Is this a change from baseline?: Pre-admission baseline Grooming: Needs assistance Is this a change from baseline?: Pre-admission baseline Feeding: Independent Bathing: Needs assistance Is this a change from baseline?: Pre-admission baseline Toileting: Needs assistance Is this a change from baseline?: Pre-admission baseline In/Out Bed: Needs assistance Is this a change from baseline?: Pre-admission baseline Walks in Home: Dependent Is this a change from baseline?: Pre-admission baseline Does the patient have difficulty walking or climbing stairs?: Yes Weakness of Legs: Both Weakness of Arms/Hands: None  Permission Sought/Granted Permission sought to share information with : Other (comment)(HH provider) Permission granted to share information with : Yes, Verbal Permission Granted     Permission granted to share info w AGENCY[facility]        Emotional Assessment Appearance:: Appears younger than stated age Attitude/Demeanor/Rapport: Other (comment)(un-engaged, demented) Affect (typically observed): Appropriate, Calm, Stable, Pleasant, Quiet   Alcohol / Substance Use: Not Applicable    Admission diagnosis:  Cough [R05] Influenza A [J10.1] Fever in adult [R50.9] Patient Active Problem List   Diagnosis Date Noted  . Influenza B 09/20/2018  . Systolic congestive heart failure ([place])   .  Palliative care encounter   . Goals of care,  counseling/discussion   . Aspiration pneumonia ([place]) 02/25/2018  . UTI (urinary tract infection) 02/25/2018  . Protein-calorie malnutrition, severe 12/18/2017  . Hypoglycemia due to type 2 diabetes mellitus ([place]) 12/17/2017  . Confusion   . General weakness   . Dehydration 11/09/2017  . FTT (failure to thrive) in adult 11/09/2017  . AKI (acute kidney injury) ([place]) 11/09/2017  . Dementia ([place]) 11/09/2017  . Renal artery stenosis ([place])   . CKD (chronic kidney disease) stage 3, GFR 30-59 ml/min (HCC)   . Pacemaker-St. Jude 04/20/2011  . Permanent atrial fibrillation 05/03/2010  . AV BLOCK, COMPLETE 11/02/2009  . ABDOMINAL PAIN-EPIGASTRIC 10/27/2008  . AODM 07/25/2008  . Type 2 diabetes mellitus with hyperlipidemia ([place]) 07/25/2008  . Cardiomyopathy, ischemic 07/25/2008  . ATHEROSCLEROSIS OF RENAL ARTERY 07/25/2008  . HYPERTENSION, BENIGN 07/25/2008   PCP:  Redmond School, MD Pharmacy:   [facility] [place], [place] [place] [address] Phone: [phone] Fax: [phone]     Social Determinants of Health (SDOH) Interventions    Readmission Risk Interventions 30 Day Unplanned Readmission Risk Score     ED to Hosp-Admission (Current) from 09/20/2018 in [place]  30 Day Unplanned Readmission Risk Score (%)  59 Filed at 09/23/2018 1200     This score is the patient's risk of an unplanned readmission within 30 days of being discharged (0 -100%). The score is based on dignosis, age, lab data, medications, orders, and past utilization.   Low:  0-14.9   Medium: 15-21.9   High: 22-29.9   Extreme: 30 and above       Readmission Risk Prevention Plan 09/23/2018  Transportation Screening Complete  Medication Review Press photographer) Complete  PCP or Specialist appointment within 3-5 days of discharge Complete  HRI or [place] Complete  SW Recovery  Care/Counseling Consult (No Data)  Palliative Care Screening Not [place] Not Applicable  Some recent data might be hidden

## 2018-09-24 LAB — COMPREHENSIVE METABOLIC PANEL
ALT: 18 U/L (ref 0–44)
AST: 32 U/L (ref 15–41)
Albumin: 2.6 g/dL — ABNORMAL LOW (ref 3.5–5.0)
Alkaline Phosphatase: 58 U/L (ref 38–126)
Anion gap: 9 (ref 5–15)
BUN: 49 mg/dL — ABNORMAL HIGH (ref 8–23)
CHLORIDE: 108 mmol/L (ref 98–111)
CO2: 21 mmol/L — ABNORMAL LOW (ref 22–32)
Calcium: 8.6 mg/dL — ABNORMAL LOW (ref 8.9–10.3)
Creatinine, Ser: 2.16 mg/dL — ABNORMAL HIGH (ref 0.61–1.24)
GFR calc Af Amer: 31 mL/min — ABNORMAL LOW (ref >60)
GFR calc non Af Amer: 27 mL/min — ABNORMAL LOW (ref >60)
Glucose, Bld: 116 mg/dL — ABNORMAL HIGH (ref 70–99)
POTASSIUM: 4.6 mmol/L (ref 3.5–5.1)
Sodium: 138 mmol/L (ref 135–145)
Total Bilirubin: 1.1 mg/dL (ref 0.3–1.2)
Total Protein: 5.8 g/dL — ABNORMAL LOW (ref 6.5–8.1)

## 2018-09-24 LAB — CBC
HCT: 29.9 % — ABNORMAL LOW (ref 39.0–52.0)
Hemoglobin: 9.7 g/dL — ABNORMAL LOW (ref 13.0–17.0)
MCH: 27.3 pg (ref 26.0–34.0)
MCHC: 32.4 g/dL (ref 30.0–36.0)
MCV: 84.2 fL (ref 80.0–100.0)
Platelets: 156 10*3/uL (ref 150–400)
RBC: 3.55 MIL/uL — ABNORMAL LOW (ref 4.22–5.81)
RDW: 17.2 % — ABNORMAL HIGH (ref 11.5–15.5)
WBC: 8.1 10*3/uL (ref 4.0–10.5)
nRBC: 0 % (ref 0.0–0.2)

## 2018-09-24 LAB — GLUCOSE, CAPILLARY
GLUCOSE-CAPILLARY: 161 mg/dL — AB (ref 70–99)
Glucose-Capillary: 100 mg/dL — ABNORMAL HIGH (ref 70–99)
Glucose-Capillary: 185 mg/dL — ABNORMAL HIGH (ref 70–99)
Glucose-Capillary: 127 mg/dL — ABNORMAL HIGH (ref 70–99)

## 2018-09-24 LAB — TSH: TSH: 2.12 u[IU]/mL (ref 0.350–4.500)

## 2018-09-24 LAB — CK: Total CK: 149 U/L (ref 49–397)

## 2018-09-24 LAB — VITAMIN B12: Vitamin B-12: 607 pg/mL (ref 180–914)

## 2018-09-24 MED ORDER — RESOURCE THICKENUP CLEAR PO POWD
ORAL | Status: DC | PRN
  Filled 2018-09-24: qty 125

## 2018-09-24 NOTE — Progress Notes (Addendum)
PROGRESS NOTE                                                                                                                                                                                                             Patient Demographics:    Neil [name], is a 85 y.o. male, DOB - 1928/07/23, CXK:[id number]  Admit date - 09/20/2018   Admitting Physician Truett Mainland, DO  Outpatient Primary MD for the patient is Redmond School, MD  LOS - 4  Outpatient Specialists: None  Chief Complaint  Patient presents with  . Cough  . Fever       Brief Narrative 85 year old male with severe dementia, hypertension, complete heart block, stage III chronic kidney disease, chronic suprapubic catheter with recurrent UTIs, permanent A. fib, ischemic cardiomyopathy who presented with pleuritic cough for past few weeks, placed on about 2 courses of antibiotic already including Zithromax and Levaquin presenting with persistent cough, shortness of breath and fever.  Patient lives with his wife who had URI symptoms (as per granddaughter at bedside she was tested positive for influenza A). In the ED he was positive for influenza B, WBC of 10.4K, BNP of 498 and normal lactic acid. UA was suggestive of UTI.  Admitted for further management.   Subjective:   Has mild cough, no shortness of breath, no wheezing  Assessment  & Plan :    Principal Problem:   Influenza B Reportedly his elderly wife tested positive for influenza A.  Continue supportive care with Tamiflu, PRN nebs and antitussives.   Active Problems: Catheter associated urinary tract infection. Has chronic suprapubic catheter with history of recurrent UTIs. UA suggestive of UTI.  Cultures growing yeast.  Will discontinue Cipro and placed on fluconazole.  Granddaughter informs his catheter was changed last week (a change every 2 weeks at home).  Acute kidney injury on CKD  (chronic kidney disease) stage 3, GFR 30-59 ml/min (HCC) Baseline creatinine of 1.1.  Creatinine continues to trend up.  Renal ultrasound does not show any hydronephrosis.  Urine output has been fair.  He was previously on ciprofloxacin which was has been discontinued. Since creatinine continues to trend up, will get nephrology input  Hypokalemia Replenished  Type 2 diabetes mellitus with hyperlipidemia (HCC) Stable.  Monitor on sliding scale coverage.     Cardiomyopathy, ischemic Appears euvolemic.  Continue statin.    Permanent atrial fibrillation  Rate controlled.  pradaxa currently on hold due to renal failure  Hypothyroidism Continue Synthroid  Severe dementia Has some issues with sundowning overnight.  Is calm at this time.   Physical deconditioning Family is not interested in having patient placed.  They are requesting home health physical therapy.    Code Status : Partial (DNI and no CPR but ACLS medication okay)  Family Communication  : Granddaughter at bedside  Disposition Plan  : Family wishes to take the patient home on discharge.  Can consider discharge once renal function starts to improve  Barriers For Discharge : Active symptoms, worsening creatinine  Consults  : nephrology  Procedures  : None  DVT Prophylaxis  : scds  Lab Results  Component Value Date   PLT 156 09/24/2018    Antibiotics  :    Anti-infectives (From admission, onward)   Start     Dose/Rate Route Frequency Ordered Stop   09/24/18 1000  oseltamivir (TAMIFLU) capsule 30 mg     30 mg Oral Daily 09/23/18 0835 09/26/18 0959   09/23/18 1100  cefTRIAXone (ROCEPHIN) 1 g in sodium chloride 0.9 % 100 mL IVPB     1 g 200 mL/hr over 30 Minutes Intravenous Every 24 hours 09/23/18 1036     09/22/18 1200  fluconazole (DIFLUCAN) tablet 100 mg     100 mg Oral Daily 09/22/18 1122     09/20/18 2230  ciprofloxacin (CIPRO) IVPB 400 mg  Status:  Discontinued     400 mg 200 mL/hr over 60 Minutes  Intravenous Every 12 hours 09/20/18 2220 09/22/18 1122   09/20/18 2200  oseltamivir (TAMIFLU) capsule 30 mg  Status:  Discontinued     30 mg Oral 2 times daily 09/20/18 2154 09/23/18 0835        Objective:   Vitals:   09/23/18 1401 09/23/18 2000 09/24/18 0623 09/24/18 1743  BP: (!) 147/88 (!) 141/86 139/73 (!) 133/98  Pulse: 68 70 77 77  Resp: 20 20 18 20   Temp:  98.2 F (36.8 C) 98.1 F (36.7 C) 98.2 F (36.8 C)  TempSrc:  Oral Oral Oral  SpO2: 94% 97% 100% 97%  Weight:      Height:        Wt Readings from Last 3 Encounters:  09/20/18 69 kg  08/29/18 69 kg  08/08/18 69 kg     Intake/Output Summary (Last 24 hours) at 09/24/2018 2043 Last data filed at 09/24/2018 0900 Gross per 24 hour  Intake 120 ml  Output -  Net 120 ml    Physical exam General exam: Alert, awake, no distress Respiratory system: clear bilaterally. Respiratory effort normal. Cardiovascular system:RRR. No murmurs, rubs, gallops. Gastrointestinal system: Abdomen is nondistended, soft and nontender. No organomegaly or masses felt. Normal bowel sounds heard. Central nervous system: No focal neurological deficits. Extremities: No C/C/E, +pedal pulses Skin: No rashes, lesions or ulcers Psychiatry: pleasant, cooperative    Data Review:    CBC Recent Labs  Lab 09/20/18 2020 09/21/18 0709 09/24/18 0455  WBC 10.8* 5.8 8.1  HGB 11.1* 9.7* 9.7*  HCT 33.9* 30.3* 29.9*  PLT 190 157 156  MCV 83.5 84.2 84.2  MCH 27.3 26.9 27.3  MCHC 32.7 32.0 32.4  RDW 17.1* 17.1* 17.2*  LYMPHSABS 0.8  --   --   MONOABS 0.8  --   --   EOSABS 0.1  --   --   BASOSABS 0.0  --   --  Chemistries  Recent Labs  Lab 09/20/18 2020 09/21/18 0709 09/22/18 0703 09/23/18 0651 09/24/18 0455  NA 135 137 135 137 138  K 4.0 3.4* 3.5 4.4 4.6  CL 100 103 104 104 108  CO2 26 27 24 23  21*  GLUCOSE 189* 97 138* 147* 116*  BUN 41* 37* 36* 42* 49*  CREATININE 1.33* 1.11 1.54* 1.93* 2.16*  CALCIUM 8.6* 8.4* 8.2* 8.7*  8.6*  AST  --   --   --   --  32  ALT  --   --   --   --  18  ALKPHOS  --   --   --   --  58  BILITOT  --   --   --   --  1.1   ------------------------------------------------------------------------------------------------------------------ No results for input(s): CHOL, HDL, LDLCALC, TRIG, CHOLHDL, LDLDIRECT in the last 72 hours.  No results found for: HGBA1C ------------------------------------------------------------------------------------------------------------------ Recent Labs    09/22/18 0703  TSH 2.120   ------------------------------------------------------------------------------------------------------------------ Recent Labs    09/22/18 0703  VITAMINB12 607    Coagulation profile No results for input(s): INR, PROTIME in the last 168 hours.  No results for input(s): DDIMER in the last 72 hours.  Cardiac Enzymes Recent Labs  Lab 09/20/18 2020  TROPONINI 0.05*   ------------------------------------------------------------------------------------------------------------------    Component Value Date/Time   BNP 498.0 (H) 09/20/2018 2020    Inpatient Medications  Scheduled Meds: . atorvastatin  40 mg Oral Q1200  . feeding supplement (ENSURE ENLIVE)  237 mL Oral BID BM  . ferrous sulfate  325 mg Oral Daily  . fluconazole  100 mg Oral Daily  . guaiFENesin  600 mg Oral BID  . insulin aspart  0-15 Units Subcutaneous TID WC  . insulin aspart  0-5 Units Subcutaneous QHS  . levothyroxine  25 mcg Oral Q1200  . oseltamivir  30 mg Oral Daily  . QUEtiapine  50 mg Oral QHS  . rOPINIRole  1 mg Oral QHS   Continuous Infusions: . sodium chloride 125 mL/hr at 09/24/18 1841  . cefTRIAXone (ROCEPHIN)  IV 1 g (09/24/18 1036)   PRN Meds:.acetaminophen, albuterol, haloperidol lactate, loperamide, ondansetron **OR** ondansetron (ZOFRAN) IV, polyethylene glycol, Resource ThickenUp Clear  Micro Results Recent Results (from the past 240 hour(s))  Urine culture      Status: Abnormal   Collection Time: 09/20/18  8:20 PM  Result Value Ref Range Status   Specimen Description   Final    URINE, CATHETERIZED Performed at [facility], [address], [place], [place] [address]    Special Requests   Final    NONE Performed at [facility], [address], [place], [place] [address]    Culture >=100,000 COLONIES/mL ESCHERICHIA COLI (A)  Final   Report Status 09/23/2018 FINAL  Final   Organism ID, Bacteria ESCHERICHIA COLI (A)  Final      Susceptibility   Escherichia coli - MIC*    AMPICILLIN >=32 RESISTANT Resistant     CEFAZOLIN <=4 SENSITIVE Sensitive     CEFTRIAXONE <=1 SENSITIVE Sensitive     CIPROFLOXACIN >=4 RESISTANT Resistant     GENTAMICIN <=1 SENSITIVE Sensitive     IMIPENEM <=0.25 SENSITIVE Sensitive     NITROFURANTOIN <=16 SENSITIVE Sensitive     TRIMETH/SULFA >=320 RESISTANT Resistant     AMPICILLIN/SULBACTAM 16 INTERMEDIATE Intermediate     PIP/TAZO <=4 SENSITIVE Sensitive     Extended ESBL NEGATIVE Sensitive     * >=100,000 COLONIES/mL ESCHERICHIA COLI  Culture, blood (routine  x 2)     Status: None (Preliminary result)   Collection Time: 09/20/18  9:45 PM  Result Value Ref Range Status   Specimen Description RIGHT ANTECUBITAL  Final   Special Requests   Final    BOTTLES DRAWN AEROBIC AND ANAEROBIC Blood Culture adequate volume   Culture   Final    NO GROWTH 4 DAYS Performed at [facility], [address] [place], [place] [address]    Report Status PENDING  Incomplete  Culture, blood (routine x 2)     Status: None (Preliminary result)   Collection Time: 09/20/18  9:46 PM  Result Value Ref Range Status   Specimen Description LEFT ANTECUBITAL  Final   Special Requests   Final    BOTTLES DRAWN AEROBIC AND ANAEROBIC Blood Culture adequate volume   Culture   Final    NO GROWTH 4 DAYS Performed at [facility], [address] [place], [place] [address]    Report Status PENDING  Incomplete    Radiology Reports Dg Chest 2 View   Result Date: 09/20/2018 CLINICAL DATA:  Cough and fever. EXAM: CHEST - 2 VIEW COMPARISON:  Chest radiograph August 04, 2018 FINDINGS: Stable cardiomegaly. Calcified aortic arch. Status post median sternotomy for CABG with fractured mid sternotomy wire. Mild pulmonary vascular congestion without pleural effusion or focal consolidation. No pneumothorax. Dual lead LEFT cardiac pacemaker in situ. Soft tissue planes and included osseous structures are nonacute. IMPRESSION: 1. Stable cardiomegaly and mild pulmonary vascular congestion. 2.  Aortic Atherosclerosis (ICD10-I70.0). Electronically Signed   By: Elon Alas M.D.   On: 09/20/2018 19:58   US Renal  Result Date: 09/23/2018 CLINICAL DATA:  85 year old male with acute kidney insufficiency. History kidney cyst. Subsequent encounter. EXAM: RENAL / URINARY TRACT ULTRASOUND COMPLETE COMPARISON:  08/08/2018 CT. FINDINGS: Evaluation particularly portions of right kidney limited by habitus/bowel gas. Right Kidney: Renal measurements: 9.7 x 3.8 x 4.6 cm = volume: 89.2 ML. Echogenicity within normal limits. No hydronephrosis. 1.4 cm cyst. Left Kidney: Renal measurements: 12 x 4.5 x 4.8 cm = volume: 136.5 mL. Echogenicity within normal limits. Renal parenchymal thinning. 5.6 cm 1.9 cm cyst. No hydronephrosis. Bladder: Catheter in place. IMPRESSION: 1. No hydronephrosis. 2. Left renal parenchymal thinning. 3. Bilateral renal cysts larger on the left. 4. Urinary bladder catheter in place.  Decompressed bladder. Electronically Signed   By: Genia Del M.D.   On: 09/23/2018 15:18   Dg Chest Port 1 View  Result Date: 09/23/2018 CLINICAL DATA:  Cough with weakness. EXAM: PORTABLE CHEST 1 VIEW COMPARISON:  09/20/2018 FINDINGS: Sequelae of CABG are again identified. A dual chamber pacemaker remains in place. The cardiac silhouette remains enlarged. There is increased pulmonary vascular congestion with new patchy airspace opacity in both lung bases. No sizable pleural  effusion or pneumothorax is identified. No acute osseous abnormality is seen. IMPRESSION: Cardiomegaly with increased pulmonary vascular congestion and patchy bibasilar opacities which may reflect edema, pneumonia, or atelectasis. Electronically Signed   By: Logan Bores M.D.   On: 09/23/2018 16:47    Time Spent in minutes  25   Kathie Dike M.D on 09/24/2018 at 8:43 PM  Between 7am to 7pm   After 7pm go to [email or web address]   [facility] -  Office  [phone]

## 2018-09-24 NOTE — Consult Note (Addendum)
Referring Provider: No ref. provider found Primary Care Physician:  Redmond School, MD Primary Nephrologist:    Reason for Consultation:   Acute kidney injury, hypovolemia, maintenance of euvolemia, evaluation of anemia and treatment and evaluation of secondary hyperparathyroidism  HPI: This is a very pleasant 85 year old gentleman who is a history of dementia but lives at home with his daughter.  He also has a wife who is elderly, who also lives at home.  He has a history of atrial fibrillation ischemic cardiomyopathy and grade 1 chronic diastolic heart failure.  He was admitted 09/20/2018 and diagnosed with influenza B.  He has a chronic indwelling suprapubic catheter and has a recurrence of pyuria.  He is being treated empirically with ciprofloxacin at this time.  He has baseline serum creatinine about 1.1 when he was admitted 09/21/2019 09/22/2018 his creatinine increased to 1.54 09/23/2018 his creatinine increased to 1.93 and on 09/24/2018 his creatinine increased to 2.16.  His granddaughter is in the room she endorses the fact that he has had poor p.o. intake and has been aspirating on solid foods.  His dementia appears to be worsening since his admission and he is at this time catatonic.  He is not for CPR but his daughter states that she wishes hydration and antibiotics and reasonable measures employed.  She is not power of attorney but his his wife is healthcare power of attorney they are close knit family and shared decision making.  He also has diarrhea copious nonbloody stool.  His renal ultrasound was performed 09/23/2018 showed no hydronephrosis.  He had left renal parenchymal thinning bilateral renal cysts.  His urinalysis is positive for blood white blood cells.  And he has greater than 100,000 colonies of E. coli in his urine this is resistant to Cipro and he was switched to Rocephin.  He does have a suprapubic catheter in place.  Urine output has been good with 1600 cc of output on 09/22/2018  and 1350 cc of output on 09/23/2018  Blood pressure 139/73 pulse 77 temperature 98.1 O2 sats 100% on room air  Sodium 138 potassium 4.6 chloride 108 CO2 21 BUN 49 creatinine 2.16 glucose 116 calcium 8.6 albumin 2.6 AST 32 ALT 18 WBC 8.1 hemoglobin 9.7 platelets 158  Medications Rocephin 1 g every 24 hours, Tamiflu 30 mg daily fluconazole 100 mg daily, atorvastatin 40 mg daily Haldol 0.5 mg as needed Seroquel 50 mg nightly insulin sliding scale, levothyroxine 25 mcg daily iron sulfate 325 mg daily Requip 1 mg nightly.  Pradaxa 75 mg daily, Mucinex 600 mg twice daily.    Cipro started 09/21/2018 discontinued 09/22/2018 Rocephin started 09/23/2018   IV fluids saline 75 cc an hour  He has been receiving Ensure to 37 cc twice daily  There is been no use of nonsteroidal anti-inflammatory drugs, ACE inhibitors, ARB's or Cox 2 inhibitors.      Past Medical History:  Diagnosis Date  . Chronic kidney disease (CKD), stage III (moderate) (HCC)   . Chronic systolic CHF (congestive heart failure) ([place])   . Complete heart block ([place])   . Coronary artery disease    status post [id number]  . Dementia ([place])   . Dyslipidemia   . Foley catheter in place   . Hypertension   . Ischemic cardiomyopathy    per last echo 11-10-2017  ef 15-20%  . Osteoarthritis   . Permanent atrial fibrillation   . Presence of permanent cardiac pacemaker cardiologist-  dr Samuel Bouche Jude status post generator replacement  2009 (original placement 02/ 1998)  . Renal artery stenosis (HCC)    95%  left, 50% right  . Requires assistance with activities of daily living (ADL)   . 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 Type 2 diabetes mellitus ([place])   . Urinary retention   . Uses wheelchair     Past Surgical History:  Procedure Laterality Date  .  suspicous skin lesions-nose and upper back-removed 04/15/18    . CARDIOVASCULAR STRESS TEST  05-22-2012   dr Caryl Comes   Low risk adenosine no exercise nuclear study w/ no ischemia/  ef 31%,  LV  moderately enlarged , global hypokinesis and severe hypokinesis of the inferior wall  . CORONARY ANGIOPLASTY WITH STENT PLACEMENT  spring 1998   stenting  . CORONARY ARTERY BYPASS GRAFT  fall 1998   x4  . INSERTION OF SUPRAPUBIC CATHETER N/A 04/16/2018   Procedure: INSERTION OF SUPRAPUBIC CATHETER;  Surgeon: Irine Seal, MD;  Location: [facility];  Service: Urology;  Laterality: N/A;  . PACEMAKER GENERATOR CHANGE  10-30-2007  dr Samuel Bouche Jude dual chamber  . PACEMAKER INSERTION  02/ 1998   dr Caryl Comes  . skin lesions    . TRANSTHORACIC ECHOCARDIOGRAM  11/10/2017   mild LVH, ef 15-20%, diffuse hypokinesis, LVDF not evaluated/  mild AS/  severe LAE/  RVSF severely reduced/  mild MR, PR, and TR/  PASP 34mHg    Prior to Admission medications   Medication Sig Start Date End Date Taking? Authorizing Provider  albuterol (PROVENTIL) (2.5 MG/3ML) 0.083% nebulizer solution Take 3 mLs (2.5 mg total) by nebulization every 6 (six) hours as needed for wheezing or shortness of breath. 02/27/18  Yes MKathie Dike MD  atorvastatin (LIPITOR) 40 MG tablet Take 40 mg by mouth daily at 12 noon.    Yes [provider]  benzonatate (TESSALON) 100 MG capsule Take 100 mg by mouth 3 (three) times daily as needed for cough.   Yes [provider]  dabigatran (PRADAXA) 75 MG CAPS capsule Take 75 mg by mouth daily.   Yes [provider]  feeding supplement, ENSURE ENLIVE, (ENSURE ENLIVE) LIQD Take 237 mLs by mouth 2 (two) times daily between meals. Patient taking differently: Take 237 mLs by mouth daily after breakfast.  12/18/17  Yes Shah, Pratik D, DO  FEROSUL 325 (65 Fe) MG tablet Take 325 mg by mouth daily.  03/14/18  Yes [provider]  furosemide (LASIX) 40 MG tablet Take 1 tablet (40 mg total) by mouth daily. 08/04/18  Yes Nanavati, Ankit, MD  glipiZIDE (GLUCOTROL XL) 2.5 MG 24 hr tablet Take 2.5-5 mg by mouth as directed. Depends on blood sugar. Checks twice daily. 03/12/18  Yes  [provider]  guaiFENesin (MUCINEX) 600 MG 12 hr tablet Take 600 mg by mouth 2 (two) times daily.   Yes [provider]  lactobacillus acidophilus & bulgar (LACTINEX) chewable tablet Chew 2 tablets by mouth at bedtime. 08/10/18  Yes Dahal, BMarlowe Aschoff MD  levofloxacin (LEVAQUIN) 250 MG tablet Take 2 tablets (500 mg total) by mouth daily. 08/17/18  Yes ALacretia Leigh MD  levothyroxine (SYNTHROID, LEVOTHROID) 25 MCG tablet Take 25 mcg by mouth daily at 12 noon.  04/13/18  Yes [provider]  Maltodextrin-Xanthan Gum (RESOURCE THICKENUP CLEAR) POWD Thicken fluids to nectar thick 02/27/18  Yes Memon, JJolaine Artist MD  pantoprazole (PROTONIX) 40 MG tablet Take 40 mg by mouth daily at 12 noon.    Yes [provider]  polyethylene glycol (  MIRALAX) packet Take 17 g by mouth daily. Patient taking differently: Take 17 g by mouth daily as needed for moderate constipation.  03/03/18  Yes Caryl Ada K, PA-C  QUEtiapine (SEROQUEL) 50 MG tablet Take 50 mg by mouth at bedtime.   Yes [provider]  rOPINIRole (REQUIP) 0.5 MG tablet Take 1 mg by mouth at bedtime.    Yes [provider]  sulfamethoxazole-trimethoprim (BACTRIM,SEPTRA) 400-80 MG tablet Take 1 tablet by mouth at bedtime. 08/30/18  Yes [provider]    Current Facility-Administered Medications  Medication Dose Route Frequency Provider Last Rate Last Dose  . 0.9 %  sodium chloride infusion   Intravenous Continuous Truett Mainland, DO 75 mL/hr at 09/22/18 1842    . acetaminophen (TYLENOL) tablet 650 mg  650 mg Oral Q6H PRN Truett Mainland, DO   650 mg at 09/22/18 2115  . albuterol (PROVENTIL) (2.5 MG/3ML) 0.083% nebulizer solution 2.5 mg  2.5 mg Nebulization Q6H PRN Truett Mainland, DO      . atorvastatin (LIPITOR) tablet 40 mg  40 mg Oral Q1200 Truett Mainland, DO   40 mg at 09/23/18 1206  . cefTRIAXone (ROCEPHIN) 1 g in sodium chloride 0.9 % 100 mL IVPB  1 g Intravenous Q24H Kathie Dike, MD 200 mL/hr at 09/23/18 1205 1 g at 09/23/18 1205  . feeding supplement (ENSURE ENLIVE) (ENSURE ENLIVE) liquid 237 mL  237 mL Oral BID BM Truett Mainland, DO   237 mL at 09/24/18 0906  . ferrous sulfate tablet 325 mg  325 mg Oral Daily Truett Mainland, DO   325 mg at 09/24/18 8676  . fluconazole (DIFLUCAN) tablet 100 mg  100 mg Oral Daily Dhungel, Nishant, MD   100 mg at 09/24/18 0903  . guaiFENesin (MUCINEX) 12 hr tablet 600 mg  600 mg Oral BID Truett Mainland, DO   600 mg at 09/24/18 7209  . haloperidol lactate (HALDOL) injection 0.5 mg  0.5 mg Intravenous Q6H PRN Dhungel, Nishant, MD   0.5 mg at 09/23/18 1721  . insulin aspart (novoLOG) injection 0-15 Units  0-15 Units Subcutaneous TID WC Truett Mainland, DO   3 Units at 09/23/18 1720  . insulin aspart (novoLOG) injection 0-5 Units  0-5 Units Subcutaneous QHS Truett Mainland, DO   2 Units at 09/22/18 2145  . levothyroxine (SYNTHROID, LEVOTHROID) tablet 25 mcg  25 mcg Oral Q1200 Truett Mainland, DO   25 mcg at 09/24/18 4709  . loperamide (IMODIUM) capsule 2 mg  2 mg Oral PRN Dhungel, Nishant, MD   2 mg at 09/22/18 1703  . ondansetron (ZOFRAN) tablet 4 mg  4 mg Oral Q6H PRN Truett Mainland, DO       Or  . ondansetron [facility]) injection 4 mg  4 mg Intravenous Q6H PRN Truett Mainland, DO      . oseltamivir (TAMIFLU) capsule 30 mg  30 mg Oral Daily Kathie Dike, MD   30 mg at 09/24/18 0903  . pantoprazole (PROTONIX) EC tablet 40 mg  40 mg Oral Q1200 Truett Mainland, DO   40 mg at 09/23/18 1206  . polyethylene glycol (MIRALAX / GLYCOLAX) packet 17 g  17 g Oral Daily PRN Truett Mainland, DO      . QUEtiapine (SEROQUEL) tablet 50 mg  50 mg Oral QHS Truett Mainland, DO   50 mg at 09/23/18 2139  . rOPINIRole (REQUIP) tablet 1 mg  1 mg Oral QHS Stinson,  Tanna Savoy, DO   1 mg at 09/23/18 2139    Allergies as of 09/20/2018 - Review Complete 09/20/2018  Allergen Reaction Noted  . Cephalexin Other (See Comments) 03/03/2018  .  Penicillins Swelling and Rash     Family History  Problem Relation Age of Onset  . Other Mother   . Cancer Mother        breast  . Alzheimer's disease Sister     Social History   Socioeconomic History  . Marital status: Married    Spouse name: [name]  . Number of children: Not on file  . Years of education: Not on file  . Highest education level: Not on file  Occupational History  . Not on file  Social Needs  . Financial resource strain: Not hard at all  . Food insecurity:    Worry: Never true    Inability: Never true  . Transportation needs:    Medical: No    Non-medical: No  Tobacco Use  . Smoking status: Never Smoker  . Smokeless tobacco: Never Used  Substance and Sexual Activity  . Alcohol use: No  . Drug use: No  . Sexual activity: Not Currently  Lifestyle  . Physical activity:    Days per week: 0 days    Minutes per session: 0 min  . Stress: Only a little  Relationships  . Social connections:    Talks on phone: Once a week    Gets together: More than three times a week    Attends religious service: 1 to 4 times per year    Active member of club or organization: No    Attends meetings of clubs or organizations: Never    Relationship status: Married  . Intimate partner violence:    Fear of current or ex partner: No    Emotionally abused: No    Physically abused: No    Forced sexual activity: No  Other Topics Concern  . Not on file  Social History Narrative  . Not on file    Review of Systems: Gen: Patient has had a decreasing fever curve he is weak and demented he is unable to provide review of systems completely however granddaughter assists with information HEENT: No visual complaints, No history of Retinopathy. Normal external appearance No Epistaxis or Sore throat. No sinusitis.   CV: Denies chest pain, angina, palpitations, syncope, orthopnea, PND, peripheral edema, and claudication. Resp: Denies dyspnea at rest, dyspnea with exercise, cough,  sputum, wheezing, coughing up blood, and pleurisy. GI: Nonbloody diarrhea GU : Denies urinary burning, blood in urine, urinary frequency, urinary hesitancy, nocturnal urination, and urinary incontinence.  No renal calculi.  Suprapubic catheter MS: Demented week lying in bed Derm: Denies rash, itching,, hives, moles, warts, or unhealing ulcers. + Dry skin Psych: Denies depression, anxiety, memory loss, suicidal ideation, hallucinations, paranoia, and confusion. Heme: Denies bruising, bleeding, and enlarged lymph nodes.  Patient taking Pradaxa Neuro: No headache.  No diplopia. No dysarthria.  No dysphasia.  No history of CVA.  No Seizures. No paresthesias.  No weakness. Endocrine No DM.  Hypothyroidism on replacement therapy no Adrenal disease.  Physical Exam: Vital signs in last 24 hours: Temp:  [98.1 F (36.7 C)-98.2 F (36.8 C)] 98.1 F (36.7 C) (03/17 0623) Pulse Rate:  [68-77] 77 (03/17 0623) Resp:  [18-20] 18 (03/17 0623) BP: (139-147)/(73-88) 139/73 (03/17 0623) SpO2:  [94 %-100 %] 100 % (03/17 0623) Last BM Date: 09/22/18 General:   Ill-appearing gentleman who is catatonic this  morning Head:  Normocephalic and atraumatic. Eyes:  Sclera clear, no icterus.   Conjunctiva pink. Ears:  Normal auditory acuity. Nose:  No deformity, discharge,  or lesions. Mouth: He follows commands and sticks out his tongue on request Neck:  Supple; no masses or thyromegaly.  Jugular venous pulse  Lungs:  Clear throughout to auscultation.   No wheezes, crackles, or rhonchi. No acute distress. Heart:  Regular rate and rhythm; no murmurs, clicks, rubs,  or gallops. Abdomen:  Soft, nontender and nondistended. No masses, hepatosplenomegaly or hernias noted. Normal bowel sounds, without guarding, and without rebound.   Msk:  Symmetrical without gross deformities. Normal posture. Pulses:  No carotid, renal, femoral bruits. DP and PT symmetrical and equal Extremities:  Without clubbing or edema. Neurologic:   Alert and  oriented x4;  grossly normal neurologically. Skin:  Intact without significant lesions or rashes.   Intake/Output from previous day: 03/16 0701 - 03/17 0700 In: 340 [P.O.:240; IV Piggyback:100] Out: 650 [Urine:650] Intake/Output this shift: Total I/O In: 120 [P.O.:120] Out: -   Lab Results: Recent Labs    09/24/18 0455  WBC 8.1  HGB 9.7*  HCT 29.9*  PLT 156   BMET Recent Labs    09/22/18 0703 09/23/18 0651 09/24/18 0455  NA 135 137 138  K 3.5 4.4 4.6  CL 104 104 108  CO2 24 23 21*  GLUCOSE 138* 147* 116*  BUN 36* 42* 49*  CREATININE 1.54* 1.93* 2.16*  CALCIUM 8.2* 8.7* 8.6*   LFT Recent Labs    09/24/18 0455  PROT 5.8*  ALBUMIN 2.6*  AST 32  ALT 18  ALKPHOS 58  BILITOT 1.1   PT/INR No results for input(s): LABPROT, INR in the last 72 hours. Hepatitis Panel No results for input(s): HEPBSAG, HCVAB, HEPAIGM, HEPBIGM in the last 72 hours.  Studies/Results: US Renal  Result Date: 09/23/2018 CLINICAL DATA:  85 year old male with acute kidney insufficiency. History kidney cyst. Subsequent encounter. EXAM: RENAL / URINARY TRACT ULTRASOUND COMPLETE COMPARISON:  08/08/2018 CT. FINDINGS: Evaluation particularly portions of right kidney limited by habitus/bowel gas. Right Kidney: Renal measurements: 9.7 x 3.8 x 4.6 cm = volume: 89.2 ML. Echogenicity within normal limits. No hydronephrosis. 1.4 cm cyst. Left Kidney: Renal measurements: 12 x 4.5 x 4.8 cm = volume: 136.5 mL. Echogenicity within normal limits. Renal parenchymal thinning. 5.6 cm 1.9 cm cyst. No hydronephrosis. Bladder: Catheter in place. IMPRESSION: 1. No hydronephrosis. 2. Left renal parenchymal thinning. 3. Bilateral renal cysts larger on the left. 4. Urinary bladder catheter in place.  Decompressed bladder. Electronically Signed   By: Genia Del M.D.   On: 09/23/2018 15:18   Dg Chest Port 1 View  Result Date: 09/23/2018 CLINICAL DATA:  Cough with weakness. EXAM: PORTABLE CHEST 1 VIEW  COMPARISON:  09/20/2018 FINDINGS: Sequelae of CABG are again identified. A dual chamber pacemaker remains in place. The cardiac silhouette remains enlarged. There is increased pulmonary vascular congestion with new patchy airspace opacity in both lung bases. No sizable pleural effusion or pneumothorax is identified. No acute osseous abnormality is seen. IMPRESSION: Cardiomegaly with increased pulmonary vascular congestion and patchy bibasilar opacities which may reflect edema, pneumonia, or atelectasis. Electronically Signed   By: Logan Bores M.D.   On: 09/23/2018 16:47    Assessment/Plan:  Acute kidney injury.  Urine sediment is a little difficult to interpret due to the fact the patient has an indwelling Foley catheter does not look like this is an acute interstitial nephritis or an acute glomerulonephritis  based on the timeframe and clinical presentation.  He is making urine and is having diarrhea and volume loss.  It may be worthwhile checking a urine sodium and seeing if there is significant volume contraction.  He has been receiving IV fluids and oral fluids although his GI losses may not be being met by his intake.  I will therefore increase his IV fluid rate to 125 cc an hour.  This may be acute tubular necrosis and there may have been some blood pressure drops prior to admission to the hospital.  What we may be seeing is ATN with some resolution of this.  Either way his creatinine should start to decrease in the next 2 to 3 days.  It is relieving to see that there is no obstruction.  Based on the ultrasound that shows no evidence of hydronephrosis.  He is taking atorvastatin and we will check a CPK level.  There is reports of anticoagulants causing nephropathy.  He does have some red blood cells in the urine and it may be worthwhile discontinuing the Pradaxa for now.  He would be at stroke risk as he does have atrial fibrillation.  However the risks and benefits need to be assessed.  He is a DNR and  not a dialysis candidate this is been confirmed with the family.  Hypertension/volume clinically appears volume deplete will check urine sodium.  Increase IV normal saline to 125 cc an hour.  Anemia we will check iron stores if he is hemoconcentrated his hemoglobin may drop.  Diarrhea consider adding Imodium to help reduce diarrhea  Dementia and poor mentation.  This appears to be leading to poor p.o. intake and risk of aspiration.  Not a candidate for feeding tube or PEG.  Would work on speech therapy and swallowing technique.  Check a TSH and B12 level  Influenza B continue Tamiflu.  Dose appropriately for renal failure  Would avoid neuro sedative medications and agree with quetiapine and Requip  Hypothyroidism check TSH    LOS: [employer] @TODAY @9 :43 AM

## 2018-09-25 LAB — RENAL FUNCTION PANEL
Albumin: 2.9 g/dL — ABNORMAL LOW (ref 3.5–5.0)
Anion gap: 8 (ref 5–15)
BUN: 54 mg/dL — ABNORMAL HIGH (ref 8–23)
CO2: 19 mmol/L — ABNORMAL LOW (ref 22–32)
Calcium: 8.3 mg/dL — ABNORMAL LOW (ref 8.9–10.3)
Chloride: 110 mmol/L (ref 98–111)
Creatinine, Ser: 2.21 mg/dL — ABNORMAL HIGH (ref 0.61–1.24)
GFR calc Af Amer: 30 mL/min — ABNORMAL LOW (ref >60)
GFR calc non Af Amer: 26 mL/min — ABNORMAL LOW (ref >60)
Glucose, Bld: 251 mg/dL — ABNORMAL HIGH (ref 70–99)
Phosphorus: 2.8 mg/dL (ref 2.5–4.6)
Potassium: 4.2 mmol/L (ref 3.5–5.1)
Sodium: 137 mmol/L (ref 135–145)

## 2018-09-25 LAB — CULTURE, BLOOD (ROUTINE X 2)
Culture: NO GROWTH
Culture: NO GROWTH
Special Requests: ADEQUATE
Special Requests: ADEQUATE

## 2018-09-25 LAB — GLUCOSE, CAPILLARY
Glucose-Capillary: 152 mg/dL — ABNORMAL HIGH (ref 70–99)
Glucose-Capillary: 253 mg/dL — ABNORMAL HIGH (ref 70–99)
Glucose-Capillary: 209 mg/dL — ABNORMAL HIGH (ref 70–99)
Glucose-Capillary: 81 mg/dL (ref 70–99)
Glucose-Capillary: 85 mg/dL (ref 70–99)

## 2018-09-25 LAB — IRON AND TIBC
Iron: 19 ug/dL — ABNORMAL LOW (ref 45–182)
SATURATION RATIOS: 8 % — AB (ref 17.9–39.5)
TIBC: 243 ug/dL — ABNORMAL LOW (ref 250–450)
UIBC: 224 ug/dL

## 2018-09-25 MED ORDER — OXYBUTYNIN CHLORIDE 5 MG PO TABS
5.0000 mg | ORAL_TABLET | Freq: Three times a day (TID) | ORAL | Status: DC
  Administered 2018-09-25 – 2018-09-26 (×2): 5 mg via ORAL
  Filled 2018-09-25 (×2): qty 1

## 2018-09-25 NOTE — Progress Notes (Signed)
[facility] ROUNDING NOTE   Subjective:   More awake and alert this morning.  Brief history this is an 85 year old gentleman with a history of dementia lives at home with daughter he has atrial fibrillation ischemic cardiomyopathy and diastolic dysfunction.  Around 09/20/2018 was diagnosed with influenza B he is a chronic indwelling Foley catheter.  His creatinine on 09/21/2018 was 1.1 on 09/22/2018 increased to three 1.5 and 09/24/2026 increased to 2.16.  Renal ultrasound showed no evidence of hydronephrosis and urinalysis was positive for E. coli.  Has been treated with Rocephin.  Family anxious to take patient home  Urine output not recorded on 09/24/2018.  Does appear to be +2 L.  Rate of IV fluids was increased to 125 cc an hour.  There is been no diarrhea since Monday, 09/23/2018  Blood pressure 152/88 pulse 77 temperature 98.1 O2 sats 93% room air I see no labs this morning in the computer.  I will order renal panel  Rocephin 1 g every 24 hours, fluconazole 100 mg daily, Tamiflu 30 mg daily, Lipitor 40 mg daily, Haldol 0.5 mg as needed, quetiapine 50 mg daily, levothyroxine 25 mg daily, iron 325 mg daily, Requip 1 mg nightly.  IVs sodium chloride 125 cc an hour Ensure Enlive 237 cc twice daily    Objective:  Vital signs in last 24 hours:  Temp:  [97.9 F (36.6 C)-98.2 F (36.8 C)] 98.1 F (36.7 C) (03/18 0608) Pulse Rate:  [77-78] 77 (03/18 0608) Resp:  [16-20] 16 (03/18 0608) BP: (129-152)/(73-98) 152/88 (03/18 0608) SpO2:  [93 %-97 %] 93 % (03/18 0608)  Weight change:  Filed Weights   09/20/18 1910 09/20/18 1911  Weight: 69 kg 69 kg    Intake/Output: I/O last 3 completed shifts: In: 2399.3 [P.O.:840; I.V.:1459.3; IV Piggyback:100] Out: 150 [Urine:150]   Intake/Output this shift:  Total I/O In: 240 [P.O.:240] Out: -   Less dehydrated chronically ill HD CVS- RRR no jugular venous distention  RS- CTA no wheezes or rales ABD- BS present soft  non-distended EXT- no edema   Basic Metabolic Panel: Recent Labs  Lab 09/20/18 2020 09/21/18 0709 09/22/18 0703 09/23/18 0651 09/24/18 0455  NA 135 137 135 137 138  K 4.0 3.4* 3.5 4.4 4.6  CL 100 103 104 104 108  CO2 26 27 24 23  21*  GLUCOSE 189* 97 138* 147* 116*  BUN 41* 37* 36* 42* 49*  CREATININE 1.33* 1.11 1.54* 1.93* 2.16*  CALCIUM 8.6* 8.4* 8.2* 8.7* 8.6*    Liver Function Tests: Recent Labs  Lab 09/24/18 0455  AST 32  ALT 18  ALKPHOS 58  BILITOT 1.1  PROT 5.8*  ALBUMIN 2.6*   No results for input(s): LIPASE, AMYLASE in the last 168 hours. No results for input(s): AMMONIA in the last 168 hours.  CBC: Recent Labs  Lab 09/20/18 2020 09/21/18 0709 09/24/18 0455  WBC 10.8* 5.8 8.1  NEUTROABS 9.0*  --   --   HGB 11.1* 9.7* 9.7*  HCT 33.9* 30.3* 29.9*  MCV 83.5 84.2 84.2  PLT 190 157 156    Cardiac Enzymes: Recent Labs  Lab 09/20/18 2020 09/24/18 0455  CKTOTAL  --  149  TROPONINI 0.05*  --     BNP: Invalid input(s): POCBNP  CBG: Recent Labs  Lab 09/24/18 0735 09/24/18 1145 09/24/18 1740 09/24/18 2210 09/25/18 0749  GLUCAP 100* 161* 185* 127* 152*    Microbiology: Results for orders placed or performed during the hospital encounter of 09/20/18  Urine  culture     Status: Abnormal   Collection Time: 09/20/18  8:20 PM  Result Value Ref Range Status   Specimen Description   Final    URINE, CATHETERIZED Performed at [facility], [address] [place], [place] [address]    Special Requests   Final    NONE Performed at [facility], [address], [place], [place] [address]    Culture >=100,000 COLONIES/mL ESCHERICHIA COLI (A)  Final   Report Status 09/23/2018 FINAL  Final   Organism ID, Bacteria ESCHERICHIA COLI (A)  Final      Susceptibility   Escherichia coli - MIC*    AMPICILLIN >=32 RESISTANT Resistant     CEFAZOLIN <=4 SENSITIVE Sensitive     CEFTRIAXONE <=1 SENSITIVE Sensitive     CIPROFLOXACIN >=4 RESISTANT Resistant      GENTAMICIN <=1 SENSITIVE Sensitive     IMIPENEM <=0.25 SENSITIVE Sensitive     NITROFURANTOIN <=16 SENSITIVE Sensitive     TRIMETH/SULFA >=320 RESISTANT Resistant     AMPICILLIN/SULBACTAM 16 INTERMEDIATE Intermediate     PIP/TAZO <=4 SENSITIVE Sensitive     Extended ESBL NEGATIVE Sensitive     * >=100,000 COLONIES/mL ESCHERICHIA COLI  Culture, blood (routine x 2)     Status: None   Collection Time: 09/20/18  9:45 PM  Result Value Ref Range Status   Specimen Description RIGHT ANTECUBITAL  Final   Special Requests   Final    BOTTLES DRAWN AEROBIC AND ANAEROBIC Blood Culture adequate volume   Culture   Final    NO GROWTH 5 DAYS Performed at [facility], [address], [place], [place] [address]    Report Status 09/25/2018 FINAL  Final  Culture, blood (routine x 2)     Status: None   Collection Time: 09/20/18  9:46 PM  Result Value Ref Range Status   Specimen Description LEFT ANTECUBITAL  Final   Special Requests   Final    BOTTLES DRAWN AEROBIC AND ANAEROBIC Blood Culture adequate volume   Culture   Final    NO GROWTH 5 DAYS Performed at [facility], [address] [place], [place] [address]    Report Status 09/25/2018 FINAL  Final    Coagulation Studies: No results for input(s): LABPROT, INR in the last 72 hours.  Urinalysis: No results for input(s): COLORURINE, LABSPEC, PHURINE, GLUCOSEU, HGBUR, BILIRUBINUR, KETONESUR, PROTEINUR, UROBILINOGEN, NITRITE, LEUKOCYTESUR in the last 72 hours.  Invalid input(s): APPERANCEUR    Imaging: US Renal  Result Date: 09/23/2018 CLINICAL DATA:  85 year old male with acute kidney insufficiency. History kidney cyst. Subsequent encounter. EXAM: RENAL / URINARY TRACT ULTRASOUND COMPLETE COMPARISON:  08/08/2018 CT. FINDINGS: Evaluation particularly portions of right kidney limited by habitus/bowel gas. Right Kidney: Renal measurements: 9.7 x 3.8 x 4.6 cm = volume: 89.2 ML. Echogenicity within normal limits. No hydronephrosis. 1.4 cm cyst.  Left Kidney: Renal measurements: 12 x 4.5 x 4.8 cm = volume: 136.5 mL. Echogenicity within normal limits. Renal parenchymal thinning. 5.6 cm 1.9 cm cyst. No hydronephrosis. Bladder: Catheter in place. IMPRESSION: 1. No hydronephrosis. 2. Left renal parenchymal thinning. 3. Bilateral renal cysts larger on the left. 4. Urinary bladder catheter in place.  Decompressed bladder. Electronically Signed   By: Genia Del M.D.   On: 09/23/2018 15:18   Dg Chest Port 1 View  Result Date: 09/23/2018 CLINICAL DATA:  Cough with weakness. EXAM: PORTABLE CHEST 1 VIEW COMPARISON:  09/20/2018 FINDINGS: Sequelae of CABG are again identified. A dual chamber pacemaker remains in place.  The cardiac silhouette remains enlarged. There is increased pulmonary vascular congestion with new patchy airspace opacity in both lung bases. No sizable pleural effusion or pneumothorax is identified. No acute osseous abnormality is seen. IMPRESSION: Cardiomegaly with increased pulmonary vascular congestion and patchy bibasilar opacities which may reflect edema, pneumonia, or atelectasis. Electronically Signed   By: Logan Bores M.D.   On: 09/23/2018 16:47     Medications:   . sodium chloride 125 mL/hr at 09/25/18 0634  . cefTRIAXone (ROCEPHIN)  IV Stopped (09/24/18 1106)   . atorvastatin  40 mg Oral Q1200  . feeding supplement (ENSURE ENLIVE)  237 mL Oral BID BM  . ferrous sulfate  325 mg Oral Daily  . fluconazole  100 mg Oral Daily  . guaiFENesin  600 mg Oral BID  . insulin aspart  0-15 Units Subcutaneous TID WC  . insulin aspart  0-5 Units Subcutaneous QHS  . levothyroxine  25 mcg Oral Q1200  . QUEtiapine  50 mg Oral QHS  . rOPINIRole  1 mg Oral QHS   acetaminophen, albuterol, haloperidol lactate, loperamide, ondansetron **OR** ondansetron (ZOFRAN) IV, polyethylene glycol, Resource ThickenUp Clear  Assessment/ Plan:   Acute kidney injury urine sediment difficult to interpret given the fact the patient has an indwelling  Foley catheter does not look like interstitial nephritis or acute glomerulonephritis based on timeframe and clinical presentation.  He is making urine although none was recorded 09/24/2018 renal function panel is pending this morning.  IV fluid rate 125 cc an hour.  Differential includes acute tubular necrosis with possible ischemic insult prior to hospitalization.  Possible prerenal azotemia secondary to dehydration CPK 149 patient taking statins B12 607 TSH 2.1  Hypertension/volume.  Continue normal saline 125 cc an hour  Anemia B12 within normal range no iron studies drawn we will check iron studies  Diarrhea resolved  Dementia B12 folate within normal range candidate for PEG tube recommend speech swallowing technique with e granddaughter at bedside  Influenza B continue Tamiflu  E. coli UTI Rocephin sensitive to  Avoid sedative medications  Hypothyroidism appears to be adequately replaced   LOS: [place] @TODAY @9 :57 AM

## 2018-09-25 NOTE — Progress Notes (Signed)
PROGRESS NOTE                                                                                                                                                                                                             Patient Demographics:    Neil [name], is a 85 y.o. male, DOB - [birth year], HYI:[id number]  Admit date - 09/20/2018   Admitting Physician Truett Mainland, DO  Outpatient Primary MD for the patient is Redmond School, MD  LOS - 5  Outpatient Specialists: None  Chief Complaint  Patient presents with  . Cough  . Fever       Brief Narrative 85 year old male with severe dementia, hypertension, complete heart block, stage III chronic kidney disease, chronic suprapubic catheter with recurrent UTIs, permanent A. fib, ischemic cardiomyopathy who presented with pleuritic cough for past few weeks, placed on about 2 courses of antibiotic already including Zithromax and Levaquin presenting with persistent cough, shortness of breath and fever.  Patient lives with his wife who had URI symptoms (as per granddaughter at bedside she was tested positive for influenza A). In the ED he was positive for influenza B, WBC of 10.4K, BNP of 498 and normal lactic acid. UA was suggestive of UTI.  Admitted for further management.   Subjective:   No shortness of breath, no cough, no chest pain  Assessment  & Plan :    Principal Problem:   Influenza B Reportedly his elderly wife tested positive for influenza A.  He has completed a course of Tamiflu   Active Problems: Catheter associated urinary tract infection. Has chronic suprapubic catheter with history of recurrent UTIs. UA suggestive of UTI.  Urine culture positive for E. coli.  He was previously on ciprofloxacin, but this was discontinued once sensitivities are available and he has since been transitioned to ceftriaxone.  Granddaughter informs his catheter was changed 1 week  prior to admission (a change every 2 weeks at home).  Acute kidney injury on CKD (chronic kidney disease) stage 3, GFR 30-59 ml/min (HCC) Baseline creatinine of 1.1.  Creatinine continues to trend up.  Renal ultrasound does not show any hydronephrosis.  Urine output has been fair.  He was previously on ciprofloxacin which was has been discontinued.  Currently on IV fluids.  Nephrology following.  Creatinine remains elevated.  Hypokalemia Replenished  Type 2  diabetes mellitus with hyperlipidemia (HCC) Stable.  Monitor on sliding scale coverage.     Cardiomyopathy, ischemic Appears euvolemic.  Continue statin.    Permanent atrial fibrillation  Rate controlled.  pradaxa currently on hold due to renal failure  Hypothyroidism Continue Synthroid  Severe dementia Has some issues with sundowning overnight.  Is calm at this time.   Physical deconditioning Family is not interested in having patient placed.  They are requesting home health physical therapy.    Code Status : Partial (DNI and no CPR but ACLS medication okay)  Family Communication  : Granddaughter at bedside  Disposition Plan  : Family wishes to take the patient home on discharge.  Can consider discharge once renal function starts to improve  Barriers For Discharge : Active symptoms, worsening creatinine  Consults  : nephrology  Procedures  : None  DVT Prophylaxis  : scds  Lab Results  Component Value Date   PLT 156 09/24/2018    Antibiotics  :    Anti-infectives (From admission, onward)   Start     Dose/Rate Route Frequency Ordered Stop   09/24/18 1000  oseltamivir (TAMIFLU) capsule 30 mg     30 mg Oral Daily 09/23/18 0835 09/25/18 0936   09/23/18 1100  cefTRIAXone (ROCEPHIN) 1 g in sodium chloride 0.9 % 100 mL IVPB     1 g 200 mL/hr over 30 Minutes Intravenous Every 24 hours 09/23/18 1036     09/22/18 1200  fluconazole (DIFLUCAN) tablet 100 mg     100 mg Oral Daily 09/22/18 1122     09/20/18 2230   ciprofloxacin (CIPRO) IVPB 400 mg  Status:  Discontinued     400 mg 200 mL/hr over 60 Minutes Intravenous Every 12 hours 09/20/18 2220 09/22/18 1122   09/20/18 2200  oseltamivir (TAMIFLU) capsule 30 mg  Status:  Discontinued     30 mg Oral 2 times daily 09/20/18 2154 09/23/18 0835        Objective:   Vitals:   09/24/18 1743 09/24/18 2207 09/25/18 0608 09/25/18 1446  BP: (!) 133/98 129/73 (!) 152/88 (!) 153/92  Pulse: 77 78 77 78  Resp: 20 20 16 16   Temp: 98.2 F (36.8 C) 97.9 F (36.6 C) 98.1 F (36.7 C) 98.6 F (37 C)  TempSrc: Oral Oral Oral   SpO2: 97% 94% 93% 98%  Weight:      Height:        Wt Readings from Last 3 Encounters:  09/20/18 69 kg  08/29/18 69 kg  08/08/18 69 kg     Intake/Output Summary (Last 24 hours) at 09/25/2018 1822 Last data filed at 09/25/2018 1300 Gross per 24 hour  Intake 2879.32 ml  Output 150 ml  Net 2729.32 ml    Physical exam General exam: Alert, awake, no distress Respiratory system: Coarse breath sounds at bases. Respiratory effort normal. Cardiovascular system:RRR. No murmurs, rubs, gallops. Gastrointestinal system: Abdomen is nondistended, soft and nontender. No organomegaly or masses felt. Normal bowel sounds heard. Central nervous system:  No focal neurological deficits. Extremities: No C/C/E, +pedal pulses Skin: No rashes, lesions or ulcers Psychiatry: Confused, pleasant    Data Review:    CBC Recent Labs  Lab 09/20/18 2020 09/21/18 0709 09/24/18 0455  WBC 10.8* 5.8 8.1  HGB 11.1* 9.7* 9.7*  HCT 33.9* 30.3* 29.9*  PLT 190 157 156  MCV 83.5 84.2 84.2  MCH 27.3 26.9 27.3  MCHC 32.7 32.0 32.4  RDW 17.1* 17.1* 17.2*  LYMPHSABS 0.8  --   --  MONOABS 0.8  --   --   EOSABS 0.1  --   --   BASOSABS 0.0  --   --     Chemistries  Recent Labs  Lab 09/21/18 0709 09/22/18 0703 09/23/18 0651 09/24/18 0455 09/25/18 1102  NA 137 135 137 138 137  K 3.4* 3.5 4.4 4.6 4.2  CL 103 104 104 108 110  CO2 27 24 23  21* 19*   GLUCOSE 97 138* 147* 116* 251*  BUN 37* 36* 42* 49* 54*  CREATININE 1.11 1.54* 1.93* 2.16* 2.21*  CALCIUM 8.4* 8.2* 8.7* 8.6* 8.3*  AST  --   --   --  32  --   ALT  --   --   --  18  --   ALKPHOS  --   --   --  58  --   BILITOT  --   --   --  1.1  --    ------------------------------------------------------------------------------------------------------------------ No results for input(s): CHOL, HDL, LDLCALC, TRIG, CHOLHDL, LDLDIRECT in the last 72 hours.  No results found for: HGBA1C ------------------------------------------------------------------------------------------------------------------ No results for input(s): TSH, T4TOTAL, T3FREE, THYROIDAB in the last 72 hours.  Invalid input(s): FREET3 ------------------------------------------------------------------------------------------------------------------ Recent Labs    09/25/18 1102  TIBC 243*  IRON 19*    Coagulation profile No results for input(s): INR, PROTIME in the last 168 hours.  No results for input(s): DDIMER in the last 72 hours.  Cardiac Enzymes Recent Labs  Lab 09/20/18 2020  TROPONINI 0.05*   ------------------------------------------------------------------------------------------------------------------    Component Value Date/Time   BNP 498.0 (H) 09/20/2018 2020    Inpatient Medications  Scheduled Meds: . atorvastatin  40 mg Oral Q1200  . feeding supplement (ENSURE ENLIVE)  237 mL Oral BID BM  . ferrous sulfate  325 mg Oral Daily  . fluconazole  100 mg Oral Daily  . guaiFENesin  600 mg Oral BID  . insulin aspart  0-15 Units Subcutaneous TID WC  . insulin aspart  0-5 Units Subcutaneous QHS  . levothyroxine  25 mcg Oral Q1200  . QUEtiapine  50 mg Oral QHS  . rOPINIRole  1 mg Oral QHS   Continuous Infusions: . sodium chloride 125 mL/hr at 09/25/18 1148  . cefTRIAXone (ROCEPHIN)  IV 1 g (09/25/18 1151)   PRN Meds:.acetaminophen, albuterol, haloperidol lactate, loperamide, ondansetron  **OR** ondansetron (ZOFRAN) IV, polyethylene glycol, Resource ThickenUp Clear  Micro Results Recent Results (from the past 240 hour(s))  Urine culture     Status: Abnormal   Collection Time: 09/20/18  8:20 PM  Result Value Ref Range Status   Specimen Description   Final    URINE, CATHETERIZED Performed at [facility], [address], [place], [place] [address]    Special Requests   Final    NONE Performed at [facility], [address], [place], [place] [address]    Culture >=100,000 COLONIES/mL ESCHERICHIA COLI (A)  Final   Report Status 09/23/2018 FINAL  Final   Organism ID, Bacteria ESCHERICHIA COLI (A)  Final      Susceptibility   Escherichia coli - MIC*    AMPICILLIN >=32 RESISTANT Resistant     CEFAZOLIN <=4 SENSITIVE Sensitive     CEFTRIAXONE <=1 SENSITIVE Sensitive     CIPROFLOXACIN >=4 RESISTANT Resistant     GENTAMICIN <=1 SENSITIVE Sensitive     IMIPENEM <=0.25 SENSITIVE Sensitive     NITROFURANTOIN <=16 SENSITIVE Sensitive     TRIMETH/SULFA >=320 RESISTANT Resistant     AMPICILLIN/SULBACTAM 16 INTERMEDIATE Intermediate  PIP/TAZO <=4 SENSITIVE Sensitive     Extended ESBL NEGATIVE Sensitive     * >=100,000 COLONIES/mL ESCHERICHIA COLI  Culture, blood (routine x 2)     Status: None   Collection Time: 09/20/18  9:45 PM  Result Value Ref Range Status   Specimen Description RIGHT ANTECUBITAL  Final   Special Requests   Final    BOTTLES DRAWN AEROBIC AND ANAEROBIC Blood Culture adequate volume   Culture   Final    NO GROWTH 5 DAYS Performed at [facility] [address], [place], [place] [address]    Report Status 09/25/2018 FINAL  Final  Culture, blood (routine x 2)     Status: None   Collection Time: 09/20/18  9:46 PM  Result Value Ref Range Status   Specimen Description LEFT ANTECUBITAL  Final   Special Requests   Final    BOTTLES DRAWN AEROBIC AND ANAEROBIC Blood Culture adequate volume   Culture   Final    NO GROWTH 5 DAYS Performed at [facility], [address], [place], [place] [address]    Report Status 09/25/2018 FINAL  Final    Radiology Reports Dg Chest 2 View  Result Date: 09/20/2018 CLINICAL DATA:  Cough and fever. EXAM: CHEST - 2 VIEW COMPARISON:  Chest radiograph August 04, 2018 FINDINGS: Stable cardiomegaly. Calcified aortic arch. Status post median sternotomy for CABG with fractured mid sternotomy wire. Mild pulmonary vascular congestion without pleural effusion or focal consolidation. No pneumothorax. Dual lead LEFT cardiac pacemaker in situ. Soft tissue planes and included osseous structures are nonacute. IMPRESSION: 1. Stable cardiomegaly and mild pulmonary vascular congestion. 2.  Aortic Atherosclerosis (ICD10-I70.0). Electronically Signed   By: Elon Alas M.D.   On: 09/20/2018 19:58   US Renal  Result Date: 09/23/2018 CLINICAL DATA:  85 year old male with acute kidney insufficiency. History kidney cyst. Subsequent encounter. EXAM: RENAL / URINARY TRACT ULTRASOUND COMPLETE COMPARISON:  08/08/2018 CT. FINDINGS: Evaluation particularly portions of right kidney limited by habitus/bowel gas. Right Kidney: Renal measurements: 9.7 x 3.8 x 4.6 cm = volume: 89.2 ML. Echogenicity within normal limits. No hydronephrosis. 1.4 cm cyst. Left Kidney: Renal measurements: 12 x 4.5 x 4.8 cm = volume: 136.5 mL. Echogenicity within normal limits. Renal parenchymal thinning. 5.6 cm 1.9 cm cyst. No hydronephrosis. Bladder: Catheter in place. IMPRESSION: 1. No hydronephrosis. 2. Left renal parenchymal thinning. 3. Bilateral renal cysts larger on the left. 4. Urinary bladder catheter in place.  Decompressed bladder. Electronically Signed   By: Genia Del M.D.   On: 09/23/2018 15:18   Dg Chest Port 1 View  Result Date: 09/23/2018 CLINICAL DATA:  Cough with weakness. EXAM: PORTABLE CHEST 1 VIEW COMPARISON:  09/20/2018 FINDINGS: Sequelae of CABG are again identified. A dual chamber pacemaker remains in place. The cardiac silhouette remains  enlarged. There is increased pulmonary vascular congestion with new patchy airspace opacity in both lung bases. No sizable pleural effusion or pneumothorax is identified. No acute osseous abnormality is seen. IMPRESSION: Cardiomegaly with increased pulmonary vascular congestion and patchy bibasilar opacities which may reflect edema, pneumonia, or atelectasis. Electronically Signed   By: Logan Bores M.D.   On: 09/23/2018 16:47    Time Spent in minutes  25   Kathie Dike M.D on 09/25/2018 at 6:22 PM  Between 7am to 7pm   After 7pm go to [email or web address]   [facility] -  Office  [phone]

## 2018-09-26 ENCOUNTER — Inpatient Hospital Stay (HOSPITAL_COMMUNITY): Payer: Medicare Other

## 2018-09-26 DIAGNOSIS — I5021 Acute systolic (congestive) heart failure: Secondary | ICD-10-CM

## 2018-09-26 LAB — RENAL FUNCTION PANEL
Albumin: 3 g/dL — ABNORMAL LOW (ref 3.5–5.0)
Albumin: 3 g/dL — ABNORMAL LOW (ref 3.5–5.0)
Anion gap: 10 (ref 5–15)
Anion gap: 11 (ref 5–15)
BUN: 52 mg/dL — ABNORMAL HIGH (ref 8–23)
BUN: 51 mg/dL — ABNORMAL HIGH (ref 8–23)
CHLORIDE: 109 mmol/L (ref 98–111)
CO2: 20 mmol/L — ABNORMAL LOW (ref 22–32)
CO2: 19 mmol/L — AB (ref 22–32)
Calcium: 8.1 mg/dL — ABNORMAL LOW (ref 8.9–10.3)
Calcium: 8.3 mg/dL — ABNORMAL LOW (ref 8.9–10.3)
Chloride: 108 mmol/L (ref 98–111)
Creatinine, Ser: 2 mg/dL — ABNORMAL HIGH (ref 0.61–1.24)
Creatinine, Ser: 2.04 mg/dL — ABNORMAL HIGH (ref 0.61–1.24)
GFR calc Af Amer: 34 mL/min — ABNORMAL LOW (ref >60)
GFR calc Af Amer: 33 mL/min — ABNORMAL LOW (ref >60)
GFR calc non Af Amer: 29 mL/min — ABNORMAL LOW (ref >60)
GFR, EST NON AFRICAN AMERICAN: 28 mL/min — AB (ref >60)
Glucose, Bld: 103 mg/dL — ABNORMAL HIGH (ref 70–99)
Glucose, Bld: 68 mg/dL — ABNORMAL LOW (ref 70–99)
PHOSPHORUS: 3.2 mg/dL (ref 2.5–4.6)
Phosphorus: 3.2 mg/dL (ref 2.5–4.6)
Potassium: 4.4 mmol/L (ref 3.5–5.1)
Potassium: 4.8 mmol/L (ref 3.5–5.1)
Sodium: 138 mmol/L (ref 135–145)
Sodium: 139 mmol/L (ref 135–145)

## 2018-09-26 LAB — CBC WITH DIFFERENTIAL/PLATELET
Abs Immature Granulocytes: 0.1 10*3/uL — ABNORMAL HIGH (ref 0.00–0.07)
Basophils Absolute: 0 10*3/uL (ref 0.0–0.1)
Basophils Relative: 0 %
Eosinophils Absolute: 0 10*3/uL (ref 0.0–0.5)
Eosinophils Relative: 0 %
HCT: 35.1 % — ABNORMAL LOW (ref 39.0–52.0)
Hemoglobin: 10.9 g/dL — ABNORMAL LOW (ref 13.0–17.0)
IMMATURE GRANULOCYTES: 1 %
Lymphocytes Relative: 4 %
Lymphs Abs: 0.6 10*3/uL — ABNORMAL LOW (ref 0.7–4.0)
MCH: 26.8 pg (ref 26.0–34.0)
MCHC: 31.1 g/dL (ref 30.0–36.0)
MCV: 86.5 fL (ref 80.0–100.0)
Monocytes Absolute: 0.7 10*3/uL (ref 0.1–1.0)
Monocytes Relative: 5 %
NEUTROS PCT: 90 %
NRBC: 0 % (ref 0.0–0.2)
Neutro Abs: 13 10*3/uL — ABNORMAL HIGH (ref 1.7–7.7)
Platelets: 216 10*3/uL (ref 150–400)
RBC: 4.06 MIL/uL — ABNORMAL LOW (ref 4.22–5.81)
RDW: 17.5 % — ABNORMAL HIGH (ref 11.5–15.5)
WBC: 14.5 10*3/uL — ABNORMAL HIGH (ref 4.0–10.5)

## 2018-09-26 LAB — LACTIC ACID, PLASMA
Lactic Acid, Venous: 2.4 mmol/L (ref 0.5–1.9)
Lactic Acid, Venous: 1 mmol/L (ref 0.5–1.9)
Lactic Acid, Venous: 1.5 mmol/L (ref 0.5–1.9)

## 2018-09-26 LAB — BASIC METABOLIC PANEL
Anion gap: 9 (ref 5–15)
BUN: 51 mg/dL — ABNORMAL HIGH (ref 8–23)
CHLORIDE: 108 mmol/L (ref 98–111)
CO2: 20 mmol/L — ABNORMAL LOW (ref 22–32)
Calcium: 8.2 mg/dL — ABNORMAL LOW (ref 8.9–10.3)
Creatinine, Ser: 2.04 mg/dL — ABNORMAL HIGH (ref 0.61–1.24)
GFR calc non Af Amer: 28 mL/min — ABNORMAL LOW (ref >60)
GFR, EST AFRICAN AMERICAN: 33 mL/min — AB (ref >60)
Glucose, Bld: 105 mg/dL — ABNORMAL HIGH (ref 70–99)
Potassium: 4.4 mmol/L (ref 3.5–5.1)
Sodium: 137 mmol/L (ref 135–145)

## 2018-09-26 LAB — BLOOD GAS, ARTERIAL
Acid-base deficit: 6.8 mmol/L — ABNORMAL HIGH (ref 0.0–2.0)
Bicarbonate: 19.2 mmol/L — ABNORMAL LOW (ref 20.0–28.0)
Drawn by: 41977
FIO2: 21
O2 Saturation: 94.3 %
Patient temperature: 37.7
pCO2 arterial: 29.3 mmHg — ABNORMAL LOW (ref 32.0–48.0)
pH, Arterial: 7.387 (ref 7.350–7.450)
pO2, Arterial: 78.5 mmHg — ABNORMAL LOW (ref 83.0–108.0)

## 2018-09-26 LAB — TROPONIN I
TROPONIN I: 0.08 ng/mL — AB (ref <0.03)
Troponin I: 0.1 ng/mL (ref <0.03)
Troponin I: 0.07 ng/mL (ref <0.03)

## 2018-09-26 LAB — GLUCOSE, CAPILLARY
GLUCOSE-CAPILLARY: 147 mg/dL — AB (ref 70–99)
Glucose-Capillary: 107 mg/dL — ABNORMAL HIGH (ref 70–99)
Glucose-Capillary: 81 mg/dL (ref 70–99)
Glucose-Capillary: 83 mg/dL (ref 70–99)
Glucose-Capillary: 84 mg/dL (ref 70–99)

## 2018-09-26 LAB — ECHOCARDIOGRAM COMPLETE
Height: 69 in
Weight: 2433.88 oz

## 2018-09-26 LAB — MAGNESIUM: MAGNESIUM: 2.1 mg/dL (ref 1.7–2.4)

## 2018-09-26 LAB — BRAIN NATRIURETIC PEPTIDE: B Natriuretic Peptide: 2624 pg/mL — ABNORMAL HIGH (ref 0.0–100.0)

## 2018-09-26 MED ORDER — CLINDAMYCIN PHOSPHATE 600 MG/50ML IV SOLN
600.0000 mg | Freq: Three times a day (TID) | INTRAVENOUS | Status: DC
  Administered 2018-09-26 – 2018-09-27 (×7): 600 mg via INTRAVENOUS
  Filled 2018-09-26 (×8): qty 50

## 2018-09-26 MED ORDER — HYDRALAZINE HCL 20 MG/ML IJ SOLN
5.0000 mg | Freq: Once | INTRAMUSCULAR | Status: AC
  Administered 2018-09-26: 5 mg via INTRAVENOUS
  Filled 2018-09-26: qty 1

## 2018-09-26 MED ORDER — MIRABEGRON ER 25 MG PO TB24
25.0000 mg | ORAL_TABLET | Freq: Every day | ORAL | Status: DC
  Administered 2018-09-26 – 2018-09-27 (×2): 25 mg via ORAL
  Filled 2018-09-26 (×3): qty 1

## 2018-09-26 MED ORDER — NITROGLYCERIN 2 % TD OINT
0.5000 [in_us] | TOPICAL_OINTMENT | Freq: Four times a day (QID) | TRANSDERMAL | Status: AC
  Administered 2018-09-26 (×2): 0.5 [in_us] via TOPICAL
  Filled 2018-09-26 (×2): qty 1

## 2018-09-26 MED ORDER — FUROSEMIDE 10 MG/ML IJ SOLN
40.0000 mg | Freq: Two times a day (BID) | INTRAMUSCULAR | Status: AC
  Administered 2018-09-26 – 2018-09-27 (×3): 40 mg via INTRAVENOUS
  Filled 2018-09-26 (×3): qty 4

## 2018-09-26 MED ORDER — HALOPERIDOL LACTATE 5 MG/ML IJ SOLN: 1.0000 mg | Freq: Four times a day (QID) | INTRAMUSCULAR | Status: DC | PRN

## 2018-09-26 MED ORDER — FUROSEMIDE 10 MG/ML IJ SOLN
20.0000 mg | Freq: Once | INTRAMUSCULAR | Status: AC
  Administered 2018-09-26: 20 mg via INTRAVENOUS
  Filled 2018-09-26: qty 2

## 2018-09-26 MED ORDER — MORPHINE SULFATE (PF) 2 MG/ML IV SOLN
1.0000 mg | INTRAVENOUS | Status: DC | PRN
  Administered 2018-09-26 (×2): 1 mg via INTRAVENOUS
  Filled 2018-09-26 (×2): qty 1

## 2018-09-26 NOTE — Progress Notes (Signed)
Night shift floor coverage note.  The patient was seen due to acute dyspnea.  Vital signs were temperature 99.8, respirations were about 24/min, pulse 84, blood pressure 178/91 mmHg and O2 sat was in the low 90s on room air.  He has been very anxious according to the staff and his granddaughter.  She also states that the patient may have aspirated yesterday while drinking a protein shake.  He denies having chest pain, headache or abdominal pain.  Physical exam  General mildly anxious. HEENT normocephalic. Neck supple, no JVD. Lungs decreased breath sounds on bases with mild rales. Heart S1-S2, RRR. Abdomen soft, nontender. Extremities no edema. Neuro moves all extremities. Psych more anxious than usual per granddaughter.  Acute dyspnea BNP is past 2600 ng/mL. Chest radiograph shows cardiomegaly and vascular congestion. Lactic acid 2.4 mmol/L. Discontinue IV fluids. Supplemental oxygen. Nitropaste to anterior chest wall. Furosemide 20 mg IVP. Morphine 1 mg IVP every hour PRN for dyspnea/anxiety. Monitor intake and output. Trend troponin level. Check echocardiogram. Clindamycin started earlier for questionable aspiration.  Tennis Must, MD.  About 45 minutes of critical care time were used during the process of this acute event.  This document was prepared using Dragon voice recognition software and may contain some unintended transcription errors.

## 2018-09-26 NOTE — Progress Notes (Signed)
RN went to patient's room to assist patient's primary nurse with irrigating suprapubic catheter as catheter was leaking.  Suprapubic catheter irrigated per p/p.  Patient noted to have shortness of breath, confusion, and was anxious at this time.  Primary RN obtained VS, respirations were noted to be 36 per minute, temp 97.5, and BP elevated at 173/88.  CBG also checked and was 107.  RN paged Tylene Fantasia, NP to make her aware of patient's condition.  Stat ABGs were ordered as well as recheck of temperature rectally.  Tylene Fantasia returned call to RN and instructed RN that Dr. Olevia Bowens will come see patient.  Dr. Olevia Bowens came to floor and assessed patient.  Patient place on 02 2 l/m per [place], new orders received. P.J. Linus Mako, RN

## 2018-09-26 NOTE — Progress Notes (Signed)
RN paged that pt was confused, anxious appearing, tachypneic, and clammy. He is looking worse than earlier tonight. NP ordered ABG, rectal temp, BMP and LA. NP called Dr. Olevia Bowens who is in house [facility] to see pt. NP called RN back to advise her of plan and Dr. Olevia Bowens was already on the unit.  KJKG, NP [facility]

## 2018-09-26 NOTE — Progress Notes (Signed)
PROGRESS NOTE                                                                                                                                                                                                             Patient Demographics:    Neil [name], is a 85 y.o. male, DOB - 07/25/1928, ZOX:[id number]  Admit date - 09/20/2018   Admitting Physician Truett Mainland, DO  Outpatient Primary MD for the patient is Redmond School, MD  LOS - 6  Outpatient Specialists: None  Chief Complaint  Patient presents with  . Cough  . Fever       Brief Narrative 85 year old male with severe dementia, hypertension, complete heart block, stage III chronic kidney disease, chronic suprapubic catheter with recurrent UTIs, permanent A. fib, ischemic cardiomyopathy who presented with pleuritic cough for past few weeks, placed on about 2 courses of antibiotic already including Zithromax and Levaquin presenting with persistent cough, shortness of breath and fever.  Patient lives with his wife who had URI symptoms (as per granddaughter at bedside she was tested positive for influenza A). In the ED he was positive for influenza B, WBC of 10.4K, BNP of 498 and normal lactic acid. UA was suggestive of UTI.  Admitted for further management.   Subjective:   Overnight, patient developed shortness of breath.  Chest x-ray indicated CHF.  IV fluids were discontinued and he was started on Lasix.  Assessment  & Plan :    Principal Problem:   Influenza B Reportedly his elderly wife tested positive for influenza A.  He has completed a course of Tamiflu   Active Problems: Catheter associated urinary tract infection. Has chronic suprapubic catheter with history of recurrent UTIs. UA suggestive of UTI.  Urine culture positive for E. coli.  He was previously on ciprofloxacin, but this was discontinued once sensitivities are available and he has since  been transitioned to ceftriaxone.  Granddaughter informs his catheter was changed 1 week prior to admission (a change every 2 weeks at home).  There was some concern that his catheter may not be emptying appropriately since it was flushed overnight.  At this time, he appears to be having good urine output through his catheter.  Staff reports minimal leakage around the catheter at this point.  Discussed with urology, Dr. Alyson Ingles who recommended starting the patient  on Myrbetriq for bladder spasms.  If the patient has low urine output, bladder scan should be performed to ensure that he is not retaining.  Urine is currently clear without any clots.  Acute kidney injury on CKD (chronic kidney disease) stage 3, GFR 30-59 ml/min (HCC) Baseline creatinine of 1.1.  Creatinine continues to trend up.  Renal ultrasound does not show any hydronephrosis.  Urine output has been fair.  He was previously on ciprofloxacin which was has been discontinued.  Nephrology following.  He also was being treated with IV fluids.  Overnight he has developed CHF and was short of breath.  IV fluids have since been discontinued and has been started on IV Lasix.  Continue to follow renal function.  Hypokalemia Replenished  Type 2 diabetes mellitus with hyperlipidemia (HCC) Stable.  Monitor on sliding scale coverage.     Cardiomyopathy, ischemic Patient has EF of 15%.  Overnight, he is developed acute on chronic systolic congestive heart failure.  IV fluids have been discontinued.  He received IV Lasix.  Echocardiogram was repeated that confirmed the ejection fraction was similar to prior studies.  Continue on IV Lasix.    Permanent atrial fibrillation  Rate controlled.  pradaxa currently on hold due to renal failure  Hypothyroidism Continue Synthroid  Severe dementia Has some issues with sundowning overnight.  Is calm at this time.   Physical deconditioning Family is not interested in having patient placed.  They are  requesting home health physical therapy.    Code Status : Partial (DNI and no CPR but ACLS medication okay)  Family Communication  : Granddaughter at bedside  Disposition Plan  : Family wishes to take the patient home on discharge.  Can consider discharge once renal function starts to improve  Barriers For Discharge : Active symptoms, worsening creatinine  Consults  : nephrology  Procedures  : None  DVT Prophylaxis  : scds  Lab Results  Component Value Date   PLT 216 09/26/2018    Antibiotics  :    Anti-infectives (From admission, onward)   Start     Dose/Rate Route Frequency Ordered Stop   09/26/18 0045  clindamycin (CLEOCIN) IVPB 600 mg     600 mg 100 mL/hr over 30 Minutes Intravenous Every 8 hours 09/26/18 0040     09/24/18 1000  oseltamivir (TAMIFLU) capsule 30 mg     30 mg Oral Daily 09/23/18 0835 09/25/18 0936   09/23/18 1100  cefTRIAXone (ROCEPHIN) 1 g in sodium chloride 0.9 % 100 mL IVPB     1 g 200 mL/hr over 30 Minutes Intravenous Every 24 hours 09/23/18 1036     09/22/18 1200  fluconazole (DIFLUCAN) tablet 100 mg  Status:  Discontinued     100 mg Oral Daily 09/22/18 1122 09/25/18 1833   09/20/18 2230  ciprofloxacin (CIPRO) IVPB 400 mg  Status:  Discontinued     400 mg 200 mL/hr over 60 Minutes Intravenous Every 12 hours 09/20/18 2220 09/22/18 1122   09/20/18 2200  oseltamivir (TAMIFLU) capsule 30 mg  Status:  Discontinued     30 mg Oral 2 times daily 09/20/18 2154 09/23/18 0835        Objective:   Vitals:   09/26/18 0027 09/26/18 0129 09/26/18 0406 09/26/18 1400  BP:  (!) 159/79 (!) 148/89 139/84  Pulse:  70 77 80  Resp:   20 20  Temp: 99.8 F (37.7 C)  98.4 F (36.9 C) 98.6 F (37 C)  TempSrc: Rectal  Oral  SpO2:   100% 98%  Weight:      Height:        Wt Readings from Last 3 Encounters:  09/20/18 69 kg  08/29/18 69 kg  08/08/18 69 kg     Intake/Output Summary (Last 24 hours) at 09/26/2018 1901 Last data filed at 09/26/2018 1644 Gross  per 24 hour  Intake 780.11 ml  Output 2700 ml  Net -1919.89 ml    Physical exam General exam: Alert, awake, no distress Respiratory system: Crackles at bases. Respiratory effort normal. Cardiovascular system:RRR. No murmurs, rubs, gallops. Gastrointestinal system: Abdomen is nondistended, soft and nontender. No organomegaly or masses felt. Normal bowel sounds heard. Central nervous system:  No focal neurological deficits. Extremities: No C/C/E, +pedal pulses Skin: No rashes, lesions or ulcers Psychiatry: Confused, pleasant.      Data Review:    CBC Recent Labs  Lab 09/20/18 2020 09/21/18 0709 09/24/18 0455 09/26/18 0040  WBC 10.8* 5.8 8.1 14.5*  HGB 11.1* 9.7* 9.7* 10.9*  HCT 33.9* 30.3* 29.9* 35.1*  PLT 190 157 156 216  MCV 83.5 84.2 84.2 86.5  MCH 27.3 26.9 27.3 26.8  MCHC 32.7 32.0 32.4 31.1  RDW 17.1* 17.1* 17.2* 17.5*  LYMPHSABS 0.8  --   --  0.6*  MONOABS 0.8  --   --  0.7  EOSABS 0.1  --   --  0.0  BASOSABS 0.0  --   --  0.0    Chemistries  Recent Labs  Lab 09/22/18 0703 09/23/18 0651 09/24/18 0455 09/25/18 1102 09/26/18 0040  NA 135 137 138 137 139  138  137  K 3.5 4.4 4.6 4.2 4.8  4.4  4.4  CL 104 104 108 110 108  109  108  CO2 24 23 21* 19* 20*  19*  20*  GLUCOSE 138* 147* 116* 251* 68*  103*  105*  BUN 36* 42* 49* 54* 51*  52*  51*  CREATININE 1.54* 1.93* 2.16* 2.21* 2.04*  2.00*  2.04*  CALCIUM 8.2* 8.7* 8.6* 8.3* 8.3*  8.1*  8.2*  MG  --   --   --   --  2.1  AST  --   --  32  --   --   ALT  --   --  18  --   --   ALKPHOS  --   --  58  --   --   BILITOT  --   --  1.1  --   --    ------------------------------------------------------------------------------------------------------------------ No results for input(s): CHOL, HDL, LDLCALC, TRIG, CHOLHDL, LDLDIRECT in the last 72 hours.  No results found for: HGBA1C  ------------------------------------------------------------------------------------------------------------------ No results for input(s): TSH, T4TOTAL, T3FREE, THYROIDAB in the last 72 hours.  Invalid input(s): FREET3 ------------------------------------------------------------------------------------------------------------------ Recent Labs    09/25/18 1102  TIBC 243*  IRON 19*    Coagulation profile No results for input(s): INR, PROTIME in the last 168 hours.  No results for input(s): DDIMER in the last 72 hours.  Cardiac Enzymes Recent Labs  Lab 09/20/18 2020 09/26/18 0404 09/26/18 0953  TROPONINI 0.05* 0.10* 0.08*   ------------------------------------------------------------------------------------------------------------------    Component Value Date/Time   BNP 2,624.0 (H) 09/26/2018 0040    Inpatient Medications  Scheduled Meds: . atorvastatin  40 mg Oral Q1200  . feeding supplement (ENSURE ENLIVE)  237 mL Oral BID BM  . ferrous sulfate  325 mg Oral Daily  . furosemide  40 mg Intravenous Q12H  . guaiFENesin  600 mg Oral BID  .  insulin aspart  0-15 Units Subcutaneous TID WC  . levothyroxine  25 mcg Oral Q1200  . mirabegron ER  25 mg Oral Daily  . QUEtiapine  50 mg Oral QHS  . rOPINIRole  1 mg Oral QHS   Continuous Infusions: . cefTRIAXone (ROCEPHIN)  IV 200 mL/hr at 09/26/18 1146  . clindamycin (CLEOCIN) IV 600 mg (09/26/18 1644)   PRN Meds:.acetaminophen, albuterol, haloperidol lactate, loperamide, morphine injection, ondansetron **OR** ondansetron (ZOFRAN) IV, polyethylene glycol, Resource ThickenUp Clear  Micro Results Recent Results (from the past 240 hour(s))  Urine culture     Status: Abnormal   Collection Time: 09/20/18  8:20 PM  Result Value Ref Range Status   Specimen Description   Final    URINE, CATHETERIZED Performed at [facility], [address], [place], [place] [address]    Special Requests   Final    NONE Performed at [facility], [address], [place], [place] [address]    Culture >=100,000 COLONIES/mL ESCHERICHIA COLI (A)  Final   Report Status 09/23/2018 FINAL  Final   Organism ID, Bacteria ESCHERICHIA COLI (A)  Final      Susceptibility   Escherichia coli - MIC*    AMPICILLIN >=32 RESISTANT Resistant     CEFAZOLIN <=4 SENSITIVE Sensitive     CEFTRIAXONE <=1 SENSITIVE Sensitive     CIPROFLOXACIN >=4 RESISTANT Resistant     GENTAMICIN <=1 SENSITIVE Sensitive     IMIPENEM <=0.25 SENSITIVE Sensitive     NITROFURANTOIN <=16 SENSITIVE Sensitive     TRIMETH/SULFA >=320 RESISTANT Resistant     AMPICILLIN/SULBACTAM 16 INTERMEDIATE Intermediate     PIP/TAZO <=4 SENSITIVE Sensitive     Extended ESBL NEGATIVE Sensitive     * >=100,000 COLONIES/mL ESCHERICHIA COLI  Culture, blood (routine x 2)     Status: None   Collection Time: 09/20/18  9:45 PM  Result Value Ref Range Status   Specimen Description RIGHT ANTECUBITAL  Final   Special Requests   Final    BOTTLES DRAWN AEROBIC AND ANAEROBIC Blood Culture adequate volume   Culture   Final    NO GROWTH 5 DAYS Performed at [facility], [address] [place], [place] [address]    Report Status 09/25/2018 FINAL  Final  Culture, blood (routine x 2)     Status: None   Collection Time: 09/20/18  9:46 PM  Result Value Ref Range Status   Specimen Description LEFT ANTECUBITAL  Final   Special Requests   Final    BOTTLES DRAWN AEROBIC AND ANAEROBIC Blood Culture adequate volume   Culture   Final    NO GROWTH 5 DAYS Performed at [facility], [address] [place], [place] [address]    Report Status 09/25/2018 FINAL  Final    Radiology Reports Dg Chest 2 View  Result Date: 09/20/2018 CLINICAL DATA:  Cough and fever. EXAM: CHEST - 2 VIEW COMPARISON:  Chest radiograph August 04, 2018 FINDINGS: Stable cardiomegaly. Calcified aortic arch. Status post median sternotomy for CABG with fractured mid sternotomy wire. Mild pulmonary vascular congestion without pleural  effusion or focal consolidation. No pneumothorax. Dual lead LEFT cardiac pacemaker in situ. Soft tissue planes and included osseous structures are nonacute. IMPRESSION: 1. Stable cardiomegaly and mild pulmonary vascular congestion. 2.  Aortic Atherosclerosis (ICD10-I70.0). Electronically Signed   By: Elon Alas M.D.   On: 09/20/2018 19:58   US Renal  Result Date: 09/23/2018 CLINICAL DATA:  85 year old male with acute kidney insufficiency. History kidney cyst. Subsequent encounter.  EXAM: RENAL / URINARY TRACT ULTRASOUND COMPLETE COMPARISON:  08/08/2018 CT. FINDINGS: Evaluation particularly portions of right kidney limited by habitus/bowel gas. Right Kidney: Renal measurements: 9.7 x 3.8 x 4.6 cm = volume: 89.2 ML. Echogenicity within normal limits. No hydronephrosis. 1.4 cm cyst. Left Kidney: Renal measurements: 12 x 4.5 x 4.8 cm = volume: 136.5 mL. Echogenicity within normal limits. Renal parenchymal thinning. 5.6 cm 1.9 cm cyst. No hydronephrosis. Bladder: Catheter in place. IMPRESSION: 1. No hydronephrosis. 2. Left renal parenchymal thinning. 3. Bilateral renal cysts larger on the left. 4. Urinary bladder catheter in place.  Decompressed bladder. Electronically Signed   By: Genia Del M.D.   On: 09/23/2018 15:18   Dg Chest Port 1 View  Result Date: 09/26/2018 CLINICAL DATA:  CHF.  Shortness of breath, cough EXAM: PORTABLE CHEST 1 VIEW COMPARISON:  09/23/2018 FINDINGS: Left pacer remains in place, unchanged. Prior CABG. Cardiomegaly with vascular congestion. Bilateral interstitial and alveolar opacities compatible with edema/CHF. No visible significant effusions or acute bony abnormality. IMPRESSION: Moderate CHF. Electronically Signed   By: Rolm Baptise M.D.   On: 09/26/2018 02:15   Dg Chest Port 1 View  Result Date: 09/23/2018 CLINICAL DATA:  Cough with weakness. EXAM: PORTABLE CHEST 1 VIEW COMPARISON:  09/20/2018 FINDINGS: Sequelae of CABG are again identified. A dual chamber pacemaker  remains in place. The cardiac silhouette remains enlarged. There is increased pulmonary vascular congestion with new patchy airspace opacity in both lung bases. No sizable pleural effusion or pneumothorax is identified. No acute osseous abnormality is seen. IMPRESSION: Cardiomegaly with increased pulmonary vascular congestion and patchy bibasilar opacities which may reflect edema, pneumonia, or atelectasis. Electronically Signed   By: Logan Bores M.D.   On: 09/23/2018 16:47    Time Spent in minutes  25   Kathie Dike M.D on 09/26/2018 at 7:01 PM  Between 7am to 7pm   After 7pm go to [email or web address]   [facility] -  Office  [phone]

## 2018-09-26 NOTE — Progress Notes (Signed)
[facility] ROUNDING NOTE   Subjective:   More awake and alert this morning.  Brief history this is an 85 year old gentleman with a history of dementia lives at home with daughter he has atrial fibrillation ischemic cardiomyopathy and diastolic dysfunction.  Around 09/20/2018 was diagnosed with influenza B he is a chronic indwelling Foley catheter.  His creatinine on 09/21/2018 was 1.1 on 09/22/2018 increased to three 1.5 and 09/24/2026 increased to 2.16.  Renal ultrasound showed no evidence of hydronephrosis and urinalysis was positive for E. coli.  Has been treated with Rocephin.  Family anxious to take patient home. Events of last night noted.  Patient became acutely short of breath.  Was covered by night shift and given IV Lasix x1 dose.  He has had some leakage from his penis and looks like there is poor output from the suprapubic catheter.  I am concerned that there could be some obstruction to the catheter either clot retention.  We will ask general surgery to evaluate today 09/26/2018  Urine output recorded 09/25/1998 2150 cc.  Urine output 09/26/1998 2900 cc no weights recorded  Blood pressure 140/89 pulse 77 temperature 98.4 O2 sats 100% room air  Rocephin 1 g every 24 hours, clindamycin 600 mg IV every 8 hours, , Lipitor 40 mg daily, Haldol 0.5 mg as needed, quetiapine 50 mg daily, levothyroxine 25 mg daily, iron 325 mg daily, Requip 1 mg nightly.  Ditropan 5 mg 3 times daily  Ensure Enlive 237 cc twice daily  Chest x-ray consistent with congestive heart failure with bilateral interstitial alveolar opacities.   Objective:  Vital signs in last 24 hours:  Temp:  [97.5 F (36.4 C)-99.8 F (37.7 C)] 98.4 F (36.9 C) (03/19 0406) Pulse Rate:  [69-78] 77 (03/19 0406) Resp:  [16-20] 20 (03/19 0406) BP: (148-173)/(79-92) 148/89 (03/19 0406) SpO2:  [98 %-100 %] 100 % (03/19 0406)  Weight change:  Filed Weights   09/20/18 1910 09/20/18 1911  Weight: 69 kg 69 kg     Intake/Output: I/O last 3 completed shifts: In: 3029.3 [P.O.:1320; I.V.:1459.3; IV Piggyback:250] Out: 1050 [Urine:1050]   Intake/Output this shift:  Total I/O In: 1.4 [IV Piggyback:1.4] Out: -   Less dehydrated chronically ill HD CVS- RRR no jugular venous distention  RS- CTA no wheezes or rales ABD- BS present soft non-distended EXT- no edema   Basic Metabolic Panel: Recent Labs  Lab 09/22/18 0703 09/23/18 0651 09/24/18 0455 09/25/18 1102 09/26/18 0040  NA 135 137 138 137 138  137  K 3.5 4.4 4.6 4.2 4.4  4.4  CL 104 104 108 110 109  108  CO2 24 23 21* 19* 19*  20*  GLUCOSE 138* 147* 116* 251* 103*  105*  BUN 36* 42* 49* 54* 52*  51*  CREATININE 1.54* 1.93* 2.16* 2.21* 2.00*  2.04*  CALCIUM 8.2* 8.7* 8.6* 8.3* 8.1*  8.2*  MG  --   --   --   --  2.1  PHOS  --   --   --  2.8 3.2    Liver Function Tests: Recent Labs  Lab 09/24/18 0455 09/25/18 1102 09/26/18 0040  AST 32  --   --   ALT 18  --   --   ALKPHOS 58  --   --   BILITOT 1.1  --   --   PROT 5.8*  --   --   ALBUMIN 2.6* 2.9* 3.0*   No results for input(s): LIPASE, AMYLASE in the last 168 hours.  No results for input(s): AMMONIA in the last 168 hours.  CBC: Recent Labs  Lab 09/20/18 2020 09/21/18 0709 09/24/18 0455 09/26/18 0040  WBC 10.8* 5.8 8.1 14.5*  NEUTROABS 9.0*  --   --  13.0*  HGB 11.1* 9.7* 9.7* 10.9*  HCT 33.9* 30.3* 29.9* 35.1*  MCV 83.5 84.2 84.2 86.5  PLT 190 157 156 216    Cardiac Enzymes: Recent Labs  Lab 09/20/18 2020 09/24/18 0455 09/26/18 0404  CKTOTAL  --  149  --   TROPONINI 0.05*  --  0.10*    BNP: Invalid input(s): POCBNP  CBG: Recent Labs  Lab 09/25/18 1641 09/25/18 2118 09/25/18 2251 09/26/18 0012 09/26/18 0759  GLUCAP 209* 81 85 107* 5    Microbiology: Results for orders placed or performed during the hospital encounter of 09/20/18  Urine culture     Status: Abnormal   Collection Time: 09/20/18  8:20 PM  Result Value Ref Range  Status   Specimen Description   Final    URINE, CATHETERIZED Performed at [facility], [address], [place], [place] [address]    Special Requests   Final    NONE Performed at [facility], [address], [place], [place] [address]    Culture >=100,000 COLONIES/mL ESCHERICHIA COLI (A)  Final   Report Status 09/23/2018 FINAL  Final   Organism ID, Bacteria ESCHERICHIA COLI (A)  Final      Susceptibility   Escherichia coli - MIC*    AMPICILLIN >=32 RESISTANT Resistant     CEFAZOLIN <=4 SENSITIVE Sensitive     CEFTRIAXONE <=1 SENSITIVE Sensitive     CIPROFLOXACIN >=4 RESISTANT Resistant     GENTAMICIN <=1 SENSITIVE Sensitive     IMIPENEM <=0.25 SENSITIVE Sensitive     NITROFURANTOIN <=16 SENSITIVE Sensitive     TRIMETH/SULFA >=320 RESISTANT Resistant     AMPICILLIN/SULBACTAM 16 INTERMEDIATE Intermediate     PIP/TAZO <=4 SENSITIVE Sensitive     Extended ESBL NEGATIVE Sensitive     * >=100,000 COLONIES/mL ESCHERICHIA COLI  Culture, blood (routine x 2)     Status: None   Collection Time: 09/20/18  9:45 PM  Result Value Ref Range Status   Specimen Description RIGHT ANTECUBITAL  Final   Special Requests   Final    BOTTLES DRAWN AEROBIC AND ANAEROBIC Blood Culture adequate volume   Culture   Final    NO GROWTH 5 DAYS Performed at [facility], [address], [place], [place] [address]    Report Status 09/25/2018 FINAL  Final  Culture, blood (routine x 2)     Status: None   Collection Time: 09/20/18  9:46 PM  Result Value Ref Range Status   Specimen Description LEFT ANTECUBITAL  Final   Special Requests   Final    BOTTLES DRAWN AEROBIC AND ANAEROBIC Blood Culture adequate volume   Culture   Final    NO GROWTH 5 DAYS Performed at [facility], [address], [place],  [address]    Report Status 09/25/2018 FINAL  Final    Coagulation Studies: No results for input(s): LABPROT, INR in the last 72 hours.  Urinalysis: No results for input(s): COLORURINE, LABSPEC,  PHURINE, GLUCOSEU, HGBUR, BILIRUBINUR, KETONESUR, PROTEINUR, UROBILINOGEN, NITRITE, LEUKOCYTESUR in the last 72 hours.  Invalid input(s): APPERANCEUR    Imaging: Dg Chest Port 1 View  Result Date: 09/26/2018 CLINICAL DATA:  CHF.  Shortness of breath, cough EXAM: PORTABLE CHEST 1 VIEW COMPARISON:  09/23/2018 FINDINGS: Left pacer remains in place, unchanged.  Prior CABG. Cardiomegaly with vascular congestion. Bilateral interstitial and alveolar opacities compatible with edema/CHF. No visible significant effusions or acute bony abnormality. IMPRESSION: Moderate CHF. Electronically Signed   By: Rolm Baptise M.D.   On: 09/26/2018 02:15     Medications:   . cefTRIAXone (ROCEPHIN)  IV Stopped (09/25/18 1221)  . clindamycin (CLEOCIN) IV 100 mL/hr at 09/26/18 0820   . atorvastatin  40 mg Oral Q1200  . feeding supplement (ENSURE ENLIVE)  237 mL Oral BID BM  . ferrous sulfate  325 mg Oral Daily  . guaiFENesin  600 mg Oral BID  . insulin aspart  0-15 Units Subcutaneous TID WC  . insulin aspart  0-5 Units Subcutaneous QHS  . levothyroxine  25 mcg Oral Q1200  . oxybutynin  5 mg Oral TID  . QUEtiapine  50 mg Oral QHS  . rOPINIRole  1 mg Oral QHS   acetaminophen, albuterol, haloperidol lactate, loperamide, morphine injection, ondansetron **OR** ondansetron (ZOFRAN) IV, polyethylene glycol, Resource ThickenUp Clear  Assessment/ Plan:   Acute kidney injury urine sediment difficult to interpret given the fact the patient has an indwelling Foley catheter does not look like interstitial nephritis or acute glomerulonephritis based on timeframe and clinical presentation.  He is making urine although none was recorded 09/24/2018 renal function panel is pending this morning.  IV fluid rate 125 cc an hour.  Differential includes acute tubular necrosis with possible ischemic insult prior to hospitalization.  Possible prerenal azotemia secondary to dehydration CPK 149 patient taking statins B12 607 TSH  2.1  Hypertension/volume.  Appears to be developing some volume overload.  Have discontinued IV fluids.  Will use Lasix 40 mg IV every 12 hours x3 doses.  Anemia B12 within normal range no iron studies drawn we will check iron studies  Diarrhea resolved  Dementia B12 folate within normal range candidate for PEG tube recommend speech swallowing technique with granddaughter at bedside  Influenza B, Tamiflu is been discontinued  E. coli UTI Rocephin sensitive to  Possible aspiration pneumonia.  Have started clindamycin 300 mg every 8 hours.  Avoid sedative medications  Hypothyroidism appears to be adequately replaced  Possible blockage to suprapubic we will have general surgery take a look and irrigate.   LOS: [place] @TODAY @10 :00 AM

## 2018-09-26 NOTE — Progress Notes (Signed)
CRITICAL VALUE ALERT  Critical Value:  Lactic Acid 2.4  Date & Time Notied:  09/26/18 0122  Provider Notified: Olevia Bowens  Orders Received/Actions taken: MD notified. No new orders.

## 2018-09-26 NOTE — Progress Notes (Signed)
*  PRELIMINARY RESULTS* Echocardiogram 2D Echocardiogram has been performed.  Leavy Cella 09/26/2018, 10:54 AM

## 2018-09-26 NOTE — Evaluation (Signed)
Clinical/Bedside Swallow Evaluation Patient Details  Name: Neil [name] MRN: [id number] Date of Birth: Nov 14, 1928  Today's Date: 09/26/2018 Time: SLP Start Time (ACUTE ONLY): 7035 SLP Stop Time (ACUTE ONLY): 0093 SLP Time Calculation (min) (ACUTE ONLY): 29 min  Past Medical History:  Past Medical History:  Diagnosis Date  . Chronic kidney disease (CKD), stage III (moderate) (HCC)   . Chronic systolic CHF (congestive heart failure) ([place])   . Complete heart block ([place])   . Coronary artery disease    status post [id number]  . Dementia ([place])   . Dyslipidemia   . Foley catheter in place   . Hypertension   . Ischemic cardiomyopathy    per last echo 11-10-2017  ef 15-20%  . Osteoarthritis   . Permanent atrial fibrillation   . Presence of permanent cardiac pacemaker cardiologist-  dr Samuel Bouche Jude status post generator replacement 2009 (original placement 02/ 1998)  . Renal artery stenosis (HCC)    95%  left, 50% right  . Requires assistance with activities of daily living (ADL)   . 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 Type 2 diabetes mellitus ([place])   . Urinary retention   . Uses wheelchair    Past Surgical History:  Past Surgical History:  Procedure Laterality Date  .  suspicous skin lesions-nose and upper back-removed 04/15/18    . CARDIOVASCULAR STRESS TEST  05-22-2012   dr Caryl Comes   Low risk adenosine no exercise nuclear study w/ no ischemia/  ef 31%,  LV moderately enlarged , global hypokinesis and severe hypokinesis of the inferior wall  . CORONARY ANGIOPLASTY WITH STENT PLACEMENT  spring 1998   stenting  . CORONARY ARTERY BYPASS GRAFT  fall 1998   x4  . INSERTION OF SUPRAPUBIC CATHETER N/A 04/16/2018   Procedure: INSERTION OF SUPRAPUBIC CATHETER;  Surgeon: Irine Seal, MD;  Location: [facility];  Service: Urology;  Laterality: N/A;  . PACEMAKER GENERATOR CHANGE  10-30-2007  dr Samuel Bouche Jude dual chamber  . PACEMAKER INSERTION  02/ 1998   dr Caryl Comes  . skin lesions    . TRANSTHORACIC  ECHOCARDIOGRAM  11/10/2017   mild LVH, ef 15-20%, diffuse hypokinesis, LVDF not evaluated/  mild AS/  severe LAE/  RVSF severely reduced/  mild MR, PR, and TR/  PASP 55mmHg   HPI:  85 year old male with severe dementia, hypertension, complete heart block, stage III chronic kidney disease, chronic suprapubic catheter with recurrent UTIs, permanent A. fib, ischemic cardiomyopathy who presented with pleuritic cough for past few weeks, placed on about 2 courses of antibiotic already including Zithromax and Levaquin presenting with persistent cough, shortness of breath and fever.  Patient lives with his wife who had URI symptoms (as per granddaughter at bedside she was tested positive for influenza A). BSE requested. Pt known to SLP from [facility] in August 2019.   Assessment / Plan / Recommendation Clinical Impression  Pt seen at bedside for a clinical swallow evaluation. Pt known to SLP from previous [facility] in August 2019 with recommendation for NTL. Pt has been consuming NTL at home since, per granddaughter. Pt likely currently decompensated from flu and had suspected aspiration event when drinking Ensure that was not thickened a few days ago. Pt consumed NTL via teaspoon and cup sips with one overt coughing episode following sequential cup sips of NTL. SLP educated Pt and family to encourage single sips only of NTL and to use spoon as needed. Pt with mildly prolonged oral phase with  solids and benefited from liquid wash. Pt's granddaughter reports that Pt typically drinks Ensure without thickener and popsicles at home. SLP explained that both are considered thin liquids and while decompensated (flu and hospitalization), it would be prudent to thicken all liquids to NTL. Recommend D3/mech soft and NTL, no straws and po meds whole in puree as able. SLP will sign off at this time. Reconsult if needed.  SLP Visit Diagnosis: Dysphagia, oropharyngeal phase (R13.12)    Aspiration Risk  Moderate aspiration risk;Risk  for inadequate nutrition/hydration    Diet Recommendation Dysphagia 3 (Mech soft);Nectar-thick liquid   Liquid Administration via: No straw;Cup;Spoon Medication Administration: Whole meds with puree Supervision: Staff to assist with self feeding;Full supervision/cueing for compensatory strategies Compensations: Slow rate;Small sips/bites Postural Changes: Seated upright at 90 degrees;Remain upright for at least 30 minutes after po intake    Other  Recommendations Oral Care Recommendations: Oral care BID;Staff/trained caregiver to provide oral care Other Recommendations: Clarify dietary restrictions;Order thickener from pharmacy;Prohibited food (jello, ice cream, thin soups);Remove water pitcher   Follow up Recommendations 24 hour supervision/assistance      Frequency and Duration            Prognosis Prognosis for Safe Diet Advancement: Fair Barriers to Reach Goals: Severity of deficits;Cognitive deficits      Swallow Study   General Date of Onset: 09/20/18 HPI: 85 year old male with severe dementia, hypertension, complete heart block, stage III chronic kidney disease, chronic suprapubic catheter with recurrent UTIs, permanent A. fib, ischemic cardiomyopathy who presented with pleuritic cough for past few weeks, placed on about 2 courses of antibiotic already including Zithromax and Levaquin presenting with persistent cough, shortness of breath and fever.  Patient lives with his wife who had URI symptoms (as per granddaughter at bedside she was tested positive for influenza A). BSE requested. Pt known to SLP from [facility] in August 2019. Type of Study: Bedside Swallow Evaluation Previous Swallow Assessment: MBS 02/2018; D3 NTL Diet Prior to this Study: Regular;Nectar-thick liquids Temperature Spikes Noted: No Respiratory Status: Nasal cannula History of Recent Intubation: No Behavior/Cognition: Alert;Cooperative;Requires cueing Oral Cavity Assessment: Within Functional Limits Oral  Care Completed by SLP: No Vision: Impaired for self-feeding Self-Feeding Abilities: Needs assist Patient Positioning: Upright in bed(tends to lean right) Baseline Vocal Quality: Normal Volitional Cough: Weak Volitional Swallow: Able to elicit    Oral/Motor/Sensory Function Overall Oral Motor/Sensory Function: Within functional limits   Ice Chips Ice chips: Within functional limits Presentation: Spoon   Thin Liquid Thin Liquid: Not tested    Nectar Thick Nectar Thick Liquid: Impaired Presentation: Cup;Self Fed;Spoon Oral Phase Impairments: Reduced labial seal Pharyngeal Phase Impairments: Cough - Immediate(cough after sequential cup sips NTL)   Honey Thick Honey Thick Liquid: Not tested   Puree Puree: Within functional limits Presentation: Spoon   Solid     Solid: Impaired Presentation: Spoon Oral Phase Impairments: Impaired mastication Oral Phase Functional Implications: Prolonged oral transit     Thank you,  Genene Churn, [place]  Calea Hribar 09/26/2018,1:38 PM

## 2018-09-26 NOTE — Progress Notes (Signed)
CRITICAL VALUE ALERT  Critical Value:  Troponin 0.10  Date & Time Notied:  09/26/2017 0545  Provider Notified: Dr. Olevia Bowens  Orders Received/Actions taken: MD aware and wishes to review next troponin level.

## 2018-09-27 LAB — RENAL FUNCTION PANEL
ANION GAP: 9 (ref 5–15)
Albumin: 2.7 g/dL — ABNORMAL LOW (ref 3.5–5.0)
BUN: 50 mg/dL — ABNORMAL HIGH (ref 8–23)
CO2: 23 mmol/L (ref 22–32)
Calcium: 8.1 mg/dL — ABNORMAL LOW (ref 8.9–10.3)
Chloride: 106 mmol/L (ref 98–111)
Creatinine, Ser: 2.18 mg/dL — ABNORMAL HIGH (ref 0.61–1.24)
GFR calc Af Amer: 30 mL/min — ABNORMAL LOW (ref >60)
GFR calc non Af Amer: 26 mL/min — ABNORMAL LOW (ref >60)
Glucose, Bld: 120 mg/dL — ABNORMAL HIGH (ref 70–99)
POTASSIUM: 3.7 mmol/L (ref 3.5–5.1)
Phosphorus: 3.5 mg/dL (ref 2.5–4.6)
Sodium: 138 mmol/L (ref 135–145)

## 2018-09-27 LAB — GLUCOSE, CAPILLARY
GLUCOSE-CAPILLARY: 99 mg/dL (ref 70–99)
GLUCOSE-CAPILLARY: 244 mg/dL — AB (ref 70–99)
Glucose-Capillary: 130 mg/dL — ABNORMAL HIGH (ref 70–99)
Glucose-Capillary: 106 mg/dL — ABNORMAL HIGH (ref 70–99)

## 2018-09-27 MED ORDER — FUROSEMIDE 40 MG PO TABS
40.0000 mg | ORAL_TABLET | Freq: Every day | ORAL | Status: DC
  Filled 2018-09-27: qty 1

## 2018-09-27 MED ORDER — SENNOSIDES-DOCUSATE SODIUM 8.6-50 MG PO TABS
1.0000 | ORAL_TABLET | Freq: Every evening | ORAL | Status: DC | PRN
  Administered 2018-09-27: 1 via ORAL
  Filled 2018-09-27: qty 1

## 2018-09-27 NOTE — Progress Notes (Signed)
Patient has not had a BM  Since Monday midlevel paged and informed

## 2018-09-27 NOTE — TOC Transition Note (Signed)
Transition of Care [facility]) - CM/SW Discharge Note   Patient Details  Name: SMITH POTENZA MRN: [id number] Date of Birth: [birth year]  [facility] CM/SW Contact:  Sherald Barge, RN Phone Number: 09/27/2018, 11:00 AM   Clinical Narrative:   Will DC this weekend. Will resume services with [facility]. Rep aware of planned DC. MD to place [facility] Orders for nursing, PT and SLP. Family making f/u appointment with PCP.    Patient Goals and CMS Choice Patient states their goals for this hospitalization and ongoing recovery are:: get out of the hospital CMS Medicare.gov Compare Post Acute Care list provided to:: Patient Represenative (must comment)(granddaughter at bedside)       Discharge Plan and Services   Discharge Planning Services: Follow-up appt scheduled                HH Arranged: RN [place] Agency: [place] ([place])   Readmission Risk Interventions Readmission Risk Prevention Plan 09/23/2018  Transportation Screening Complete  Medication Review Press photographer) Complete  PCP or Specialist appointment within 3-5 days of discharge Complete  HRI or [place] Complete  SW Recovery Care/Counseling Consult (No Data)  [place] Not Applicable  Some recent data might be hidden

## 2018-09-27 NOTE — Progress Notes (Signed)
Pt sitting up in bed, daughter Marliss Czar) at bedside. Pt eating a full whopper burger brought in from outside. No resp distress, coughing, or signs of choking noted. Encouraged family  Member to feed pureed meals on the tray.

## 2018-09-27 NOTE — Progress Notes (Signed)
Family request nursing techs to not come in to room unless she calls [place] for help. She request patient be able to sleep for a long as possible w/o being interrupted

## 2018-09-27 NOTE — Care Management Important Message (Signed)
Important Message  Patient Details  Name: Neil [name] MRN: [id number] Date of Birth: Dec 30, 1928   Medicare Important Message Given:  Yes    Sherald Barge, RN 09/27/2018, 11:02 AM

## 2018-09-27 NOTE — Progress Notes (Signed)
Patient ID: Neil [name], male   DOB: [birth year], 85 y.o.   MRN: [id number] [facility] Progress Note   Assessment/ Plan:   1. Acute kidney Injury: Initially suspected to be hemodynamically mediated however did not improve with intravenous fluids-patient tipped into CHF exacerbation prompting discontinuation of fluids and use of intravenous diuretics over the past 24 hours with excellent response to diuretics.  Based on the timeline of events, it is plausible that he had mild ATN superimposed on possible dysfunctional suprapubic catheter.  Overnight, mild rise of creatinine noted.  I will transition him to his home dose of oral furosemide today and monitor for the next 24 hours to determine ability for discharge home. 2.  Acute exacerbation of congestive heart failure: Possibly exacerbated by intravenous fluids for management of acute kidney injury/UTI-improved with diuresis. 3.  E. coli urinary tract infection: On intravenous ceftriaxone based on sensitivity data. 4.  Possible aspiration pneumonia: Precautions being taken to limit additional aspiration events, on clindamycin along with ceftriaxone. 5.  Dementia: Episodic sundowning symptoms noted yesterday, continue aspiration precautions.  Subjective:   Without acute events overnight, excellent response to furosemide noted.  He is unable to voice any complaints.   Objective:   BP (!) 144/84 (BP Location: Right Arm)   Pulse 77   Temp 98.3 F (36.8 C) (Oral)   Resp 20   Ht 5\' 9"  (1.753 m)   Wt 69.4 kg   SpO2 100%   BMI 22.59 kg/m   Intake/Output Summary (Last 24 hours) at 09/27/2018 0932 Last data filed at 09/27/2018 2993 Gross per 24 hour  Intake 678.68 ml  Output 4250 ml  Net -3571.32 ml   Weight change:   Physical Exam: Gen: Appears comfortable resting in bed, granddaughter at bedside CVS: Pulse regular rhythm, normal rate, S1 and S2 normal Resp: Coarse breath sounds bilaterally, no distinct rales or  rhonchi Abd: Soft, flat, nontender Ext: No lower extremity edema  Imaging: Dg Chest Port 1 View  Result Date: 09/26/2018 CLINICAL DATA:  Initial evaluation for she aspiration. EXAM: PORTABLE CHEST 1 VIEW COMPARISON:  Prior radiograph from earlier the same day. FINDINGS: Left-sided pacemaker/AICD in place. And surgical clips. Stable cardiomegaly. Mediastinal silhouette normal. Lung volumes are reduced. Small moderate bilateral pleural effusions, right greater than left, mildly worsened from previous. Underlying moderate diffuse pulmonary interstitial edema, relatively similar. Superimposed bibasilar opacities could reflect atelectasis, edema, or infiltrates, also mildly worsened. No pneumothorax. Osseous structures unchanged. IMPRESSION: 1. Cardiomegaly with moderate diffuse pulmonary interstitial edema, with slightly worsened small moderate bilateral pleural effusions, right worse than left. 2. Superimposed bibasilar opacities could reflect edema, atelectasis, or infiltrates, also mildly worsened. Electronically Signed   By: Jeannine Boga M.D.   On: 09/26/2018 23:13   Dg Chest Port 1 View  Result Date: 09/26/2018 CLINICAL DATA:  CHF.  Shortness of breath, cough EXAM: PORTABLE CHEST 1 VIEW COMPARISON:  09/23/2018 FINDINGS: Left pacer remains in place, unchanged. Prior CABG. Cardiomegaly with vascular congestion. Bilateral interstitial and alveolar opacities compatible with edema/CHF. No visible significant effusions or acute bony abnormality. IMPRESSION: Moderate CHF. Electronically Signed   By: Rolm Baptise M.D.   On: 09/26/2018 02:15    Labs: BMET Recent Labs  Lab 09/21/18 0709 09/22/18 0703 09/23/18 0651 09/24/18 0455 09/25/18 1102 09/26/18 0040 09/27/18 0555  NA 137 135 137 138 137 139  138  137 138  K 3.4* 3.5 4.4 4.6 4.2 4.8  4.4  4.4 3.7  CL 103 104 104 108 110  108  109  108 106  CO2 27 24 23  21* 19* 20*  19*  20* 23  GLUCOSE 97 138* 147* 116* 251* 68*  103*  105*  120*  BUN 37* 36* 42* 49* 54* 51*  52*  51* 50*  CREATININE 1.11 1.54* 1.93* 2.16* 2.21* 2.04*  2.00*  2.04* 2.18*  CALCIUM 8.4* 8.2* 8.7* 8.6* 8.3* 8.3*  8.1*  8.2* 8.1*  PHOS  --   --   --   --  2.8 3.2  3.2 3.5   CBC Recent Labs  Lab 09/20/18 2020 09/21/18 0709 09/24/18 0455 09/26/18 0040  WBC 10.8* 5.8 8.1 14.5*  NEUTROABS 9.0*  --   --  13.0*  HGB 11.1* 9.7* 9.7* 10.9*  HCT 33.9* 30.3* 29.9* 35.1*  MCV 83.5 84.2 84.2 86.5  PLT 190 157 156 216    Medications:    . atorvastatin  40 mg Oral Q1200  . feeding supplement (ENSURE ENLIVE)  237 mL Oral BID BM  . ferrous sulfate  325 mg Oral Daily  . furosemide  40 mg Intravenous Q12H  . guaiFENesin  600 mg Oral BID  . insulin aspart  0-15 Units Subcutaneous TID WC  . levothyroxine  25 mcg Oral Q1200  . mirabegron ER  25 mg Oral Daily  . QUEtiapine  50 mg Oral QHS  . rOPINIRole  1 mg Oral QHS   Elmarie Shiley, MD 09/27/2018, 9:32 AM

## 2018-09-27 NOTE — Progress Notes (Signed)
PROGRESS NOTE                                                                                                                                                                                                             Patient Demographics:    Neil [name], is a 85 y.o. male, DOB - 11/17/1928, NTI:[id number]  Admit date - 09/20/2018   Admitting Physician Truett Mainland, DO  Outpatient Primary MD for the patient is Redmond School, MD  LOS - 7  Outpatient Specialists: None  Chief Complaint  Patient presents with  . Cough  . Fever       Brief Narrative 85 year old male with severe dementia, hypertension, complete heart block, stage III chronic kidney disease, chronic suprapubic catheter with recurrent UTIs, permanent A. fib, ischemic cardiomyopathy who presented with pleuritic cough for past few weeks, placed on about 2 courses of antibiotic already including Zithromax and Levaquin presenting with persistent cough, shortness of breath and fever.  Patient lives with his wife who had URI symptoms (as per granddaughter at bedside she was tested positive for influenza A). In the ED he was positive for influenza B, WBC of 10.4K, BNP of 498 and normal lactic acid. UA was suggestive of UTI.  Admitted for further management.   Subjective:   No shortness of breath.  No specific complaints.  Assessment  & Plan :    Principal Problem:   Influenza B Reportedly his elderly wife tested positive for influenza A.  He has completed a course of Tamiflu   Active Problems: Catheter associated urinary tract infection. Has chronic suprapubic catheter with history of recurrent UTIs. UA suggestive of UTI.  Urine culture positive for E. coli.  He was previously on ciprofloxacin, but this was discontinued once sensitivities are available and he has since been transitioned to ceftriaxone.  Granddaughter informs his catheter was changed 1  week prior to admission (a change every 2 weeks at home).  There was some concern that his catheter may not be emptying appropriately since it was flushed overnight.  At this time, he appears to be having good urine output through his catheter.  Staff reports minimal leakage around the catheter at this point.  Discussed with urology, Dr. Alyson Ingles who recommended starting the patient on Myrbetriq for bladder spasms.  If the patient has low urine output, bladder  scan should be performed to ensure that he is not retaining.  Urine is currently clear without any clots.  Acute kidney injury on CKD (chronic kidney disease) stage 3, GFR 30-59 ml/min (HCC) Baseline creatinine of 1.1.  Creatinine continues to trend up.  Renal ultrasound does not show any hydronephrosis.  Urine output has been fair.  He was previously on ciprofloxacin which was has been discontinued.  Nephrology following.  He was being treated with IV fluids but developed decompensated CHF.  IV fluids discontinued and he was started on Lasix with good response.  Now transition oral Lasix.  Continue to follow renal function.  Dysphagia with aspiration. Patient had episode of aspiration was noted to have significant dysphagia.  Started on clindamycin.  Seen by speech therapy and diet has been modified.  Hypokalemia Replenished  Type 2 diabetes mellitus with hyperlipidemia (HCC) Stable.  Monitor on sliding scale coverage.     Cardiomyopathy, ischemic/acute on chronic systolic congestive heart failure Patient has EF of 15%.  Patient developed decompensated CHF due to hydration for renal failure.  IV fluids were discontinued and he has responded well to IV Lasix.  Now back on oral dose of Lasix.Marland Kitchen    Permanent atrial fibrillation  Rate controlled.  pradaxa currently on hold due to renal failure  Hypothyroidism Continue Synthroid  Severe dementia Has some issues with sundowning overnight.  Is calm at this time.   Physical deconditioning  Family is not interested in having patient placed.  They are requesting home health physical therapy.    Code Status : Partial (DNI and no CPR but ACLS medication okay)  Family Communication  : Granddaughter at bedside  Disposition Plan  : Family wishes to take the patient home on discharge.  Can consider discharge once renal function starts to improve  Barriers For Discharge : Active symptoms, worsening creatinine  Consults  : nephrology  Procedures  : Echo: 1. The left ventricle has a visually estimated ejection fraction of 15%. The cavity size was moderately dilated. Left ventricular diastolic Doppler parameters are indeterminate.  2. The right ventricle has severely reduced systolic function. The cavity was mildly enlarged. There is no increase in right ventricular wall thickness.  3. Left atrial size was severely dilated.  4. Right atrial size was moderately dilated.  5. The aortic valve is tricuspid Aortic valve regurgitation is trivial by color flow Doppler.  6. Compared to echo from May 2019 there is no significant change.  DVT Prophylaxis  : scds  Lab Results  Component Value Date   PLT 216 09/26/2018    Antibiotics  :    Anti-infectives (From admission, onward)   Start     Dose/Rate Route Frequency Ordered Stop   09/26/18 0045  clindamycin (CLEOCIN) IVPB 600 mg     600 mg 100 mL/hr over 30 Minutes Intravenous Every 8 hours 09/26/18 0040     09/24/18 1000  oseltamivir (TAMIFLU) capsule 30 mg     30 mg Oral Daily 09/23/18 0835 09/25/18 0936   09/23/18 1100  cefTRIAXone (ROCEPHIN) 1 g in sodium chloride 0.9 % 100 mL IVPB     1 g 200 mL/hr over 30 Minutes Intravenous Every 24 hours 09/23/18 1036     09/22/18 1200  fluconazole (DIFLUCAN) tablet 100 mg  Status:  Discontinued     100 mg Oral Daily 09/22/18 1122 09/25/18 1833   09/20/18 2230  ciprofloxacin (CIPRO) IVPB 400 mg  Status:  Discontinued     400 mg 200 mL/hr  over 60 Minutes Intravenous Every 12 hours 09/20/18  2220 09/22/18 1122   09/20/18 2200  oseltamivir (TAMIFLU) capsule 30 mg  Status:  Discontinued     30 mg Oral 2 times daily 09/20/18 2154 09/23/18 0835        Objective:   Vitals:   09/26/18 2120 09/27/18 0625 09/27/18 0647 09/27/18 1500  BP: (!) 146/85 (!) 144/84  140/79  Pulse: 77 77  77  Resp: 20 20  18   Temp: 97.6 F (36.4 C) 98.3 F (36.8 C)  98 F (36.7 C)  TempSrc: Oral Oral  Oral  SpO2: 100% 100%  100%  Weight:   69.4 kg   Height:        Wt Readings from Last 3 Encounters:  09/27/18 69.4 kg  08/29/18 69 kg  08/08/18 69 kg     Intake/Output Summary (Last 24 hours) at 09/27/2018 1832 Last data filed at 09/27/2018 0900 Gross per 24 hour  Intake 50 ml  Output 2450 ml  Net -2400 ml    Physical exam General exam: Alert, awake, no distress Respiratory system: Clear to auscultation. Respiratory effort normal. Cardiovascular system:RRR. No murmurs, rubs, gallops. Gastrointestinal system: Abdomen is nondistended, soft and nontender. No organomegaly or masses felt. Normal bowel sounds heard. Central nervous system: Alert and oriented. No focal neurological deficits. Extremities: 1+ edema Skin: No rashes, lesions or ulcers Psychiatry: Mildly confused, pleasant.     Data Review:    CBC Recent Labs  Lab 09/20/18 2020 09/21/18 0709 09/24/18 0455 09/26/18 0040  WBC 10.8* 5.8 8.1 14.5*  HGB 11.1* 9.7* 9.7* 10.9*  HCT 33.9* 30.3* 29.9* 35.1*  PLT 190 157 156 216  MCV 83.5 84.2 84.2 86.5  MCH 27.3 26.9 27.3 26.8  MCHC 32.7 32.0 32.4 31.1  RDW 17.1* 17.1* 17.2* 17.5*  LYMPHSABS 0.8  --   --  0.6*  MONOABS 0.8  --   --  0.7  EOSABS 0.1  --   --  0.0  BASOSABS 0.0  --   --  0.0    Chemistries  Recent Labs  Lab 09/23/18 0651 09/24/18 0455 09/25/18 1102 09/26/18 0040 09/27/18 0555  NA 137 138 137 139  138  137 138  K 4.4 4.6 4.2 4.8  4.4  4.4 3.7  CL 104 108 110 108  109  108 106  CO2 23 21* 19* 20*  19*  20* 23  GLUCOSE 147* 116* 251* 68*   103*  105* 120*  BUN 42* 49* 54* 51*  52*  51* 50*  CREATININE 1.93* 2.16* 2.21* 2.04*  2.00*  2.04* 2.18*  CALCIUM 8.7* 8.6* 8.3* 8.3*  8.1*  8.2* 8.1*  MG  --   --   --  2.1  --   AST  --  32  --   --   --   ALT  --  18  --   --   --   ALKPHOS  --  58  --   --   --   BILITOT  --  1.1  --   --   --    ------------------------------------------------------------------------------------------------------------------ No results for input(s): CHOL, HDL, LDLCALC, TRIG, CHOLHDL, LDLDIRECT in the last 72 hours.  No results found for: HGBA1C ------------------------------------------------------------------------------------------------------------------ No results for input(s): TSH, T4TOTAL, T3FREE, THYROIDAB in the last 72 hours.  Invalid input(s): FREET3 ------------------------------------------------------------------------------------------------------------------ Recent Labs    09/25/18 1102  TIBC 243*  IRON 19*    Coagulation profile No results for input(s): INR,  PROTIME in the last 168 hours.  No results for input(s): DDIMER in the last 72 hours.  Cardiac Enzymes Recent Labs  Lab 09/26/18 0404 09/26/18 0953 09/26/18 1818  TROPONINI 0.10* 0.08* 0.07*   ------------------------------------------------------------------------------------------------------------------    Component Value Date/Time   BNP 2,624.0 (H) 09/26/2018 0040    Inpatient Medications  Scheduled Meds: . atorvastatin  40 mg Oral Q1200  . feeding supplement (ENSURE ENLIVE)  237 mL Oral BID BM  . ferrous sulfate  325 mg Oral Daily  . furosemide  40 mg Oral Daily  . guaiFENesin  600 mg Oral BID  . insulin aspart  0-15 Units Subcutaneous TID WC  . levothyroxine  25 mcg Oral Q1200  . mirabegron ER  25 mg Oral Daily  . QUEtiapine  50 mg Oral QHS  . rOPINIRole  1 mg Oral QHS   Continuous Infusions: . cefTRIAXone (ROCEPHIN)  IV 1 g (09/27/18 1048)  . clindamycin (CLEOCIN) IV 600 mg  (09/27/18 1750)   PRN Meds:.acetaminophen, albuterol, haloperidol lactate, loperamide, morphine injection, ondansetron **OR** ondansetron (ZOFRAN) IV, polyethylene glycol, Resource ThickenUp Clear  Micro Results Recent Results (from the past 240 hour(s))  Urine culture     Status: Abnormal   Collection Time: 09/20/18  8:20 PM  Result Value Ref Range Status   Specimen Description   Final    URINE, CATHETERIZED Performed at [facility], [address] [place], [place] [address]    Special Requests   Final    NONE Performed at [facility], [address] [place], [place] [address]    Culture >=100,000 COLONIES/mL ESCHERICHIA COLI (A)  Final   Report Status 09/23/2018 FINAL  Final   Organism ID, Bacteria ESCHERICHIA COLI (A)  Final      Susceptibility   Escherichia coli - MIC*    AMPICILLIN >=32 RESISTANT Resistant     CEFAZOLIN <=4 SENSITIVE Sensitive     CEFTRIAXONE <=1 SENSITIVE Sensitive     CIPROFLOXACIN >=4 RESISTANT Resistant     GENTAMICIN <=1 SENSITIVE Sensitive     IMIPENEM <=0.25 SENSITIVE Sensitive     NITROFURANTOIN <=16 SENSITIVE Sensitive     TRIMETH/SULFA >=320 RESISTANT Resistant     AMPICILLIN/SULBACTAM 16 INTERMEDIATE Intermediate     PIP/TAZO <=4 SENSITIVE Sensitive     Extended ESBL NEGATIVE Sensitive     * >=100,000 COLONIES/mL ESCHERICHIA COLI  Culture, blood (routine x 2)     Status: None   Collection Time: 09/20/18  9:45 PM  Result Value Ref Range Status   Specimen Description RIGHT ANTECUBITAL  Final   Special Requests   Final    BOTTLES DRAWN AEROBIC AND ANAEROBIC Blood Culture adequate volume   Culture   Final    NO GROWTH 5 DAYS Performed at [facility], [address], [place], [place] [address]    Report Status 09/25/2018 FINAL  Final  Culture, blood (routine x 2)     Status: None   Collection Time: 09/20/18  9:46 PM  Result Value Ref Range Status   Specimen Description LEFT ANTECUBITAL  Final   Special Requests   Final    BOTTLES DRAWN  AEROBIC AND ANAEROBIC Blood Culture adequate volume   Culture   Final    NO GROWTH 5 DAYS Performed at [facility], [address] [place], [place] [address]    Report Status 09/25/2018 FINAL  Final    Radiology Reports Dg Chest 2 View  Result Date: 09/20/2018 CLINICAL DATA:  Cough and fever. EXAM: CHEST -  2 VIEW COMPARISON:  Chest radiograph August 04, 2018 FINDINGS: Stable cardiomegaly. Calcified aortic arch. Status post median sternotomy for CABG with fractured mid sternotomy wire. Mild pulmonary vascular congestion without pleural effusion or focal consolidation. No pneumothorax. Dual lead LEFT cardiac pacemaker in situ. Soft tissue planes and included osseous structures are nonacute. IMPRESSION: 1. Stable cardiomegaly and mild pulmonary vascular congestion. 2.  Aortic Atherosclerosis (ICD10-I70.0). Electronically Signed   By: Elon Alas M.D.   On: 09/20/2018 19:58   US Renal  Result Date: 09/23/2018 CLINICAL DATA:  85 year old male with acute kidney insufficiency. History kidney cyst. Subsequent encounter. EXAM: RENAL / URINARY TRACT ULTRASOUND COMPLETE COMPARISON:  08/08/2018 CT. FINDINGS: Evaluation particularly portions of right kidney limited by habitus/bowel gas. Right Kidney: Renal measurements: 9.7 x 3.8 x 4.6 cm = volume: 89.2 ML. Echogenicity within normal limits. No hydronephrosis. 1.4 cm cyst. Left Kidney: Renal measurements: 12 x 4.5 x 4.8 cm = volume: 136.5 mL. Echogenicity within normal limits. Renal parenchymal thinning. 5.6 cm 1.9 cm cyst. No hydronephrosis. Bladder: Catheter in place. IMPRESSION: 1. No hydronephrosis. 2. Left renal parenchymal thinning. 3. Bilateral renal cysts larger on the left. 4. Urinary bladder catheter in place.  Decompressed bladder. Electronically Signed   By: Genia Del M.D.   On: 09/23/2018 15:18   Dg Chest Port 1 View  Result Date: 09/26/2018 CLINICAL DATA:  Initial evaluation for she aspiration. EXAM: PORTABLE CHEST 1 VIEW COMPARISON:   Prior radiograph from earlier the same day. FINDINGS: Left-sided pacemaker/AICD in place. And surgical clips. Stable cardiomegaly. Mediastinal silhouette normal. Lung volumes are reduced. Small moderate bilateral pleural effusions, right greater than left, mildly worsened from previous. Underlying moderate diffuse pulmonary interstitial edema, relatively similar. Superimposed bibasilar opacities could reflect atelectasis, edema, or infiltrates, also mildly worsened. No pneumothorax. Osseous structures unchanged. IMPRESSION: 1. Cardiomegaly with moderate diffuse pulmonary interstitial edema, with slightly worsened small moderate bilateral pleural effusions, right worse than left. 2. Superimposed bibasilar opacities could reflect edema, atelectasis, or infiltrates, also mildly worsened. Electronically Signed   By: Jeannine Boga M.D.   On: 09/26/2018 23:13   Dg Chest Port 1 View  Result Date: 09/26/2018 CLINICAL DATA:  CHF.  Shortness of breath, cough EXAM: PORTABLE CHEST 1 VIEW COMPARISON:  09/23/2018 FINDINGS: Left pacer remains in place, unchanged. Prior CABG. Cardiomegaly with vascular congestion. Bilateral interstitial and alveolar opacities compatible with edema/CHF. No visible significant effusions or acute bony abnormality. IMPRESSION: Moderate CHF. Electronically Signed   By: Rolm Baptise M.D.   On: 09/26/2018 02:15   Dg Chest Port 1 View  Result Date: 09/23/2018 CLINICAL DATA:  Cough with weakness. EXAM: PORTABLE CHEST 1 VIEW COMPARISON:  09/20/2018 FINDINGS: Sequelae of CABG are again identified. A dual chamber pacemaker remains in place. The cardiac silhouette remains enlarged. There is increased pulmonary vascular congestion with new patchy airspace opacity in both lung bases. No sizable pleural effusion or pneumothorax is identified. No acute osseous abnormality is seen. IMPRESSION: Cardiomegaly with increased pulmonary vascular congestion and patchy bibasilar opacities which may reflect  edema, pneumonia, or atelectasis. Electronically Signed   By: Logan Bores M.D.   On: 09/23/2018 16:47    Time Spent in minutes  25   Kathie Dike M.D on 09/27/2018 at 6:32 PM  Between 7am to 7pm   After 7pm go to [email or web address]   [facility] -  Office  [phone]

## 2018-09-28 DIAGNOSIS — Z950 Presence of cardiac pacemaker: Secondary | ICD-10-CM

## 2018-09-28 DIAGNOSIS — I255 Ischemic cardiomyopathy: Secondary | ICD-10-CM

## 2018-09-28 LAB — RENAL FUNCTION PANEL
Albumin: 3.2 g/dL — ABNORMAL LOW (ref 3.5–5.0)
Anion gap: 12 (ref 5–15)
BUN: 52 mg/dL — ABNORMAL HIGH (ref 8–23)
CO2: 25 mmol/L (ref 22–32)
Calcium: 8.6 mg/dL — ABNORMAL LOW (ref 8.9–10.3)
Chloride: 102 mmol/L (ref 98–111)
Creatinine, Ser: 2.17 mg/dL — ABNORMAL HIGH (ref 0.61–1.24)
GFR calc Af Amer: 31 mL/min — ABNORMAL LOW (ref >60)
GFR calc non Af Amer: 26 mL/min — ABNORMAL LOW (ref >60)
Glucose, Bld: 186 mg/dL — ABNORMAL HIGH (ref 70–99)
PHOSPHORUS: 3.3 mg/dL (ref 2.5–4.6)
Potassium: 3.8 mmol/L (ref 3.5–5.1)
Sodium: 139 mmol/L (ref 135–145)

## 2018-09-28 LAB — GLUCOSE, CAPILLARY: Glucose-Capillary: 193 mg/dL — ABNORMAL HIGH (ref 70–99)

## 2018-09-28 MED ORDER — POLYETHYLENE GLYCOL 3350 17 G PO PACK: 17.0000 g | PACK | Freq: Every day | ORAL | Status: AC | PRN

## 2018-09-28 MED ORDER — DOXYCYCLINE HYCLATE 100 MG PO CAPS: 100.0000 mg | ORAL_CAPSULE | Freq: Every day | ORAL | 0 refills | Status: AC

## 2018-09-28 MED ORDER — APIXABAN 2.5 MG PO TABS: 2.5000 mg | ORAL_TABLET | Freq: Two times a day (BID) | ORAL | 0 refills | Status: DC

## 2018-09-28 MED ORDER — MIRABEGRON ER 25 MG PO TB24: 25.0000 mg | ORAL_TABLET | Freq: Every day | ORAL | 0 refills | Status: AC

## 2018-09-28 NOTE — Discharge Summary (Signed)
Physician Discharge Summary  Neil [name] PPI:[id number] DOB: 09/09/1928 DOA: 09/20/2018  PCP: Redmond School, MD  Admit date: 09/20/2018 Discharge date: 09/28/2018  Admitted From:  Home  Disposition:  Home with family for 24/7 supervision  Recommendations for Outpatient Follow-up:  1. Follow up with PCP in 1 weeks 2. Please obtain BMP/CBC in one week 3. Follow up with urology in 2 weeks 4. Follow up with nephrology in 2 weeks  Discharge Condition: STABLE   CODE STATUS: PARTIAL    Brief Hospitalization Summary: Please see all hospital notes, images, labs for full details of the hospitalization. Dr. Glenna Durand HPI: Neil [name] is a 85 y.o. male with a history of Dementia, hypertension, complete heart block, stage III chronic kidney disease, suprapubic catheter, permanent atrial fibrillation, ischemic cardiomyopathy with chronic diastolic grade 1 heart failure.  The patient has dementia, history obtained from the caregiver.  Patient has had a productive cough for the past couple of weeks and has been on a couple courses of antibiotics, including Zithromax and Levaquin.  He just recently finished course of Zithromax.  He has a difficult time being up sputum but feels that it is thick.  No palliating or provoking factors.  He does have sick contacts in the form of patient's caregiver who is had a low-grade fever over the past week.  Additionally, the patient started having a fever evening and was brought to the hospital for evaluation  Emergency Department Course: Influenza B positive, white count 10.4, need 1.33.  BNP 498.  Lactic acid normal.  Brief Narrative 85 year old male with severe dementia, hypertension, complete heart block, stage III chronic kidney disease, chronic suprapubic catheter with recurrent UTIs, permanent A. fib, ischemic cardiomyopathy who presented with pleuritic cough for past few weeks, placed on about 2 courses of antibiotic already including Zithromax and  Levaquin presenting with persistent cough, shortness of breath and fever.  Patient lives with his wife who had URI symptoms (as per granddaughter at bedside she was tested positive for influenza A). In the ED he was positive for influenza B, WBC of 10.4K, BNP of 498 and normal lactic acid. UA was suggestive of UTI.  Admitted for further management.   Influenza B Reportedly his elderly wife tested positive for influenza A.  He has completed a course of Tamiflu  Catheter associated urinary tract infection. Has chronic suprapubic catheter with history of recurrent UTIs. UA suggestive of UTI.  Urine culture positive for E. coli.  He was previously on ciprofloxacin, but this was discontinued once sensitivities are available and he has since been transitioned to ceftriaxone.  Granddaughter informs his catheter was changed 1 week prior to admission (a change every 2 weeks at home).  There was some concern that his catheter may not be emptying appropriately since it was flushed overnight.  At this time, he appears to be having good urine output through his catheter.  Staff reports minimal leakage around the catheter at this point.  Discussed with urology, Dr. Alyson Ingles who recommended starting the patient on Myrbetriq for bladder spasms.  If the patient has low urine output, bladder scan should be performed to ensure that he is not retaining.  Urine is currently clear without any clots.   Completed course of IV ceftriaxone.  Resume home nightly Bactrim DS tabs. Follow up with urology.   Acute kidney injury on CKD (chronic kidney disease) stage 3, GFR 30-59 ml/min  Renal ultrasound does not show any hydronephrosis.  Urine output has been fair.  He  was previously on ciprofloxacin which was has been discontinued.  Nephrology has seen him.  His creatinine has remained stable at around 2.17 .  He was being treated with IV fluids but developed decompensated CHF.  IV fluids discontinued and he was started on home  dose oral Lasix with good response.  Continue to follow renal function outpatient.  Dysphagia with aspiration. Patient had episode of aspiration was noted to have significant dysphagia.  Started on clindamycin.  Seen by speech therapy and diet has been modified.  He is on dysphagia 3 (mechanical soft) nectar thick liquids.   Hypokalemia Replenished  Type 2 diabetes mellitus with hyperlipidemia Stable.  resume home meds at discharge.  Follow up with PCP.  Cardiomyopathy, ischemic/acute on chronic systolic congestive heart failure Patient has EF of 15%.  Patient developed decompensated CHF due to hydration for renal failure.  IV fluids were discontinued and he has responded well to IV Lasix.  Now back on oral dose of Lasix.    Permanent atrial fibrillation  Rate controlled.  pradaxa discontinued due to renal failure and has been prescribed apixaban 2.5 mg BID  Hypothyroidism Continue Synthroid  Severe dementia Had some issues with sundowning while in hospital. Currently at baseline.  Physical deconditioning Family is not interested in having patient placed.  They are requesting home health physical therapy.  Code Status : Partial (DNI and no CPR but ACLS medication okay)  Family Communication  : Granddaughter at bedside  Disposition Plan  : Family wishes to take the patient home on discharge and agreed to 24/7 supervision  Consults  : nephrology  Procedures  : Echo:1. The left ventricle has a visually estimated ejection fraction of 15%. The cavity size was moderately dilated. Left ventricular diastolic Doppler parameters are indeterminate. 2. The right ventricle has severely reduced systolic function. The cavity was mildly enlarged. There is no increase in right ventricular wall thickness. 3. Left atrial size was severely dilated. 4. Right atrial size was moderately dilated. 5. The aortic valve is tricuspid Aortic valve regurgitation is trivial by color flow  Doppler. 6. Compared to echo from May 2019 there is no significant change.  DVT Prophylaxis  : SCD  Discharge Diagnoses:  Principal Problem:   Influenza B Active Problems:   Type 2 diabetes mellitus with hyperlipidemia (HCC)   Cardiomyopathy, ischemic   Permanent atrial fibrillation   HYPERTENSION, BENIGN   Pacemaker-[employer]. Jude   Dementia (HCC)   CKD (chronic kidney disease) stage 3, GFR 30-59 ml/min (HCC)   UTI (urinary tract infection)   Discharge Instructions: Discharge Instructions    Call MD for:  difficulty breathing, headache or visual disturbances   Complete by:  As directed    Call MD for:  extreme fatigue   Complete by:  As directed    Call MD for:  persistant dizziness or light-headedness   Complete by:  As directed    Call MD for:  persistant nausea and vomiting   Complete by:  As directed    Call MD for:  severe uncontrolled pain   Complete by:  As directed    Increase activity slowly   Complete by:  As directed      Allergies as of 09/28/2018      Reactions   Cephalexin Other (See Comments)   hallucinations Other reaction(s): Delusions (intolerance)   Penicillins Swelling, Rash   Itching Has patient had a PCN reaction causing immediate rash, facial/tongue/throat swelling, SOB or lightheadedness with hypotension: Yes Has patient had a  PCN reaction causing severe rash involving mucus membranes or skin necrosis: Yes Has patient had a PCN reaction that required hospitalization: No Has patient had a PCN reaction occurring within the last 10 years: No If all of the above answers are "NO", then may proceed with Cephalosporin use.      Medication List    STOP taking these medications   benzonatate 100 MG capsule Commonly known as:  TESSALON   dabigatran 75 MG Caps capsule Commonly known as:  PRADAXA   levofloxacin 250 MG tablet Commonly known as:  LEVAQUIN     TAKE these medications   albuterol (2.5 MG/3ML) 0.083% nebulizer solution Commonly known  as:  PROVENTIL Take 3 mLs (2.5 mg total) by nebulization every 6 (six) hours as needed for wheezing or shortness of breath.   apixaban 2.5 MG Tabs tablet Commonly known as:  ELIQUIS Take 1 tablet (2.5 mg total) by mouth 2 (two) times daily for 30 days.   atorvastatin 40 MG tablet Commonly known as:  LIPITOR Take 40 mg by mouth daily at 12 noon.   doxycycline 100 MG capsule Commonly known as:  VIBRAMYCIN Take 1 capsule (100 mg total) by mouth daily for 5 days.   feeding supplement (ENSURE ENLIVE) Liqd Take 237 mLs by mouth 2 (two) times daily between meals. What changed:  when to take this   FeroSul 325 (65 FE) MG tablet Generic drug:  ferrous sulfate Take 325 mg by mouth daily.   furosemide 40 MG tablet Commonly known as:  LASIX Take 1 tablet (40 mg total) by mouth daily.   glipiZIDE 2.5 MG 24 hr tablet Commonly known as:  GLUCOTROL XL Take 2.5-5 mg by mouth as directed. Depends on blood sugar. Checks twice daily.   guaiFENesin 600 MG 12 hr tablet Commonly known as:  MUCINEX Take 600 mg by mouth 2 (two) times daily.   lactobacillus acidophilus & bulgar chewable tablet Chew 2 tablets by mouth at bedtime.   levothyroxine 25 MCG tablet Commonly known as:  SYNTHROID, LEVOTHROID Take 25 mcg by mouth daily at 12 noon.   mirabegron ER 25 MG Tb24 tablet Commonly known as:  MYRBETRIQ Take 1 tablet (25 mg total) by mouth daily for 30 days.   pantoprazole 40 MG tablet Commonly known as:  PROTONIX Take 40 mg by mouth daily at 12 noon.   polyethylene glycol packet Commonly known as:  MiraLax Take 17 g by mouth daily as needed for moderate constipation.   QUEtiapine 50 MG tablet Commonly known as:  SEROQUEL Take 50 mg by mouth at bedtime.   Resource ThickenUp Clear Powd Thicken fluids to nectar thick   rOPINIRole 0.5 MG tablet Commonly known as:  REQUIP Take 1 mg by mouth at bedtime.   sulfamethoxazole-trimethoprim 400-80 MG tablet Commonly known as:   BACTRIM,SEPTRA Take 1 tablet by mouth at bedtime.      Follow-up Information    Redmond School, MD. Schedule an appointment as soon as possible for a visit in 5 day(s).   Specialty:  Internal Medicine Why:  Hospital Follow Up  Contact information: [address] [place] [address] [phone]        Irine Seal, MD. Schedule an appointment as soon as possible for a visit in 1 week(s).   Specialty:  Urology Why:  Hospital Follow Up  Contact information: [address] [place] [address] [phone]        Fran Lowes, MD. Schedule an appointment as soon as possible  for a visit in 2 week(s).   Specialty:  Nephrology Why:  Hospital Follow Up for acute on chronic kidney disease Contact information: [address] [place] [address] [phone]          Allergies  Allergen Reactions  . Cephalexin Other (See Comments)    hallucinations Other reaction(s): Delusions (intolerance)  . Penicillins Swelling and Rash    Itching Has patient had a PCN reaction causing immediate rash, facial/tongue/throat swelling, SOB or lightheadedness with hypotension: Yes Has patient had a PCN reaction causing severe rash involving mucus membranes or skin necrosis: Yes Has patient had a PCN reaction that required hospitalization: No Has patient had a PCN reaction occurring within the last 10 years: No If all of the above answers are "NO", then may proceed with Cephalosporin use.   Allergies as of 09/28/2018      Reactions   Cephalexin Other (See Comments)   hallucinations Other reaction(s): Delusions (intolerance)   Penicillins Swelling, Rash   Itching Has patient had a PCN reaction causing immediate rash, facial/tongue/throat swelling, SOB or lightheadedness with hypotension: Yes Has patient had a PCN reaction causing severe rash involving mucus membranes or skin necrosis: Yes Has patient had a PCN reaction that required hospitalization: No Has  patient had a PCN reaction occurring within the last 10 years: No If all of the above answers are "NO", then may proceed with Cephalosporin use.      Medication List    STOP taking these medications   benzonatate 100 MG capsule Commonly known as:  TESSALON   dabigatran 75 MG Caps capsule Commonly known as:  PRADAXA   levofloxacin 250 MG tablet Commonly known as:  LEVAQUIN     TAKE these medications   albuterol (2.5 MG/3ML) 0.083% nebulizer solution Commonly known as:  PROVENTIL Take 3 mLs (2.5 mg total) by nebulization every 6 (six) hours as needed for wheezing or shortness of breath.   apixaban 2.5 MG Tabs tablet Commonly known as:  ELIQUIS Take 1 tablet (2.5 mg total) by mouth 2 (two) times daily for 30 days.   atorvastatin 40 MG tablet Commonly known as:  LIPITOR Take 40 mg by mouth daily at 12 noon.   doxycycline 100 MG capsule Commonly known as:  VIBRAMYCIN Take 1 capsule (100 mg total) by mouth daily for 5 days.   feeding supplement (ENSURE ENLIVE) Liqd Take 237 mLs by mouth 2 (two) times daily between meals. What changed:  when to take this   FeroSul 325 (65 FE) MG tablet Generic drug:  ferrous sulfate Take 325 mg by mouth daily.   furosemide 40 MG tablet Commonly known as:  LASIX Take 1 tablet (40 mg total) by mouth daily.   glipiZIDE 2.5 MG 24 hr tablet Commonly known as:  GLUCOTROL XL Take 2.5-5 mg by mouth as directed. Depends on blood sugar. Checks twice daily.   guaiFENesin 600 MG 12 hr tablet Commonly known as:  MUCINEX Take 600 mg by mouth 2 (two) times daily.   lactobacillus acidophilus & bulgar chewable tablet Chew 2 tablets by mouth at bedtime.   levothyroxine 25 MCG tablet Commonly known as:  SYNTHROID, LEVOTHROID Take 25 mcg by mouth daily at 12 noon.   mirabegron ER 25 MG Tb24 tablet Commonly known as:  MYRBETRIQ Take 1 tablet (25 mg total) by mouth daily for 30 days.   pantoprazole 40 MG tablet Commonly known as:  PROTONIX Take  40 mg by mouth daily at 12 noon.   polyethylene  glycol packet Commonly known as:  MiraLax Take 17 g by mouth daily as needed for moderate constipation.   QUEtiapine 50 MG tablet Commonly known as:  SEROQUEL Take 50 mg by mouth at bedtime.   Resource ThickenUp Clear Powd Thicken fluids to nectar thick   rOPINIRole 0.5 MG tablet Commonly known as:  REQUIP Take 1 mg by mouth at bedtime.   sulfamethoxazole-trimethoprim 400-80 MG tablet Commonly known as:  BACTRIM,SEPTRA Take 1 tablet by mouth at bedtime.       Procedures/Studies: Dg Chest 2 View  Result Date: 09/20/2018 CLINICAL DATA:  Cough and fever. EXAM: CHEST - 2 VIEW COMPARISON:  Chest radiograph August 04, 2018 FINDINGS: Stable cardiomegaly. Calcified aortic arch. Status post median sternotomy for CABG with fractured mid sternotomy wire. Mild pulmonary vascular congestion without pleural effusion or focal consolidation. No pneumothorax. Dual lead LEFT cardiac pacemaker in situ. Soft tissue planes and included osseous structures are nonacute. IMPRESSION: 1. Stable cardiomegaly and mild pulmonary vascular congestion. 2.  Aortic Atherosclerosis (ICD10-I70.0). Electronically Signed   By: Elon Alas M.D.   On: 09/20/2018 19:58   US Renal  Result Date: 09/23/2018 CLINICAL DATA:  85 year old male with acute kidney insufficiency. History kidney cyst. Subsequent encounter. EXAM: RENAL / URINARY TRACT ULTRASOUND COMPLETE COMPARISON:  08/08/2018 CT. FINDINGS: Evaluation particularly portions of right kidney limited by habitus/bowel gas. Right Kidney: Renal measurements: 9.7 x 3.8 x 4.6 cm = volume: 89.2 ML. Echogenicity within normal limits. No hydronephrosis. 1.4 cm cyst. Left Kidney: Renal measurements: 12 x 4.5 x 4.8 cm = volume: 136.5 mL. Echogenicity within normal limits. Renal parenchymal thinning. 5.6 cm 1.9 cm cyst. No hydronephrosis. Bladder: Catheter in place. IMPRESSION: 1. No hydronephrosis. 2. Left renal parenchymal  thinning. 3. Bilateral renal cysts larger on the left. 4. Urinary bladder catheter in place.  Decompressed bladder. Electronically Signed   By: Genia Del M.D.   On: 09/23/2018 15:18   Dg Chest Port 1 View  Result Date: 09/26/2018 CLINICAL DATA:  Initial evaluation for she aspiration. EXAM: PORTABLE CHEST 1 VIEW COMPARISON:  Prior radiograph from earlier the same day. FINDINGS: Left-sided pacemaker/AICD in place. And surgical clips. Stable cardiomegaly. Mediastinal silhouette normal. Lung volumes are reduced. Small moderate bilateral pleural effusions, right greater than left, mildly worsened from previous. Underlying moderate diffuse pulmonary interstitial edema, relatively similar. Superimposed bibasilar opacities could reflect atelectasis, edema, or infiltrates, also mildly worsened. No pneumothorax. Osseous structures unchanged. IMPRESSION: 1. Cardiomegaly with moderate diffuse pulmonary interstitial edema, with slightly worsened small moderate bilateral pleural effusions, right worse than left. 2. Superimposed bibasilar opacities could reflect edema, atelectasis, or infiltrates, also mildly worsened. Electronically Signed   By: Jeannine Boga M.D.   On: 09/26/2018 23:13   Dg Chest Port 1 View  Result Date: 09/26/2018 CLINICAL DATA:  CHF.  Shortness of breath, cough EXAM: PORTABLE CHEST 1 VIEW COMPARISON:  09/23/2018 FINDINGS: Left pacer remains in place, unchanged. Prior CABG. Cardiomegaly with vascular congestion. Bilateral interstitial and alveolar opacities compatible with edema/CHF. No visible significant effusions or acute bony abnormality. IMPRESSION: Moderate CHF. Electronically Signed   By: Rolm Baptise M.D.   On: 09/26/2018 02:15   Dg Chest Port 1 View  Result Date: 09/23/2018 CLINICAL DATA:  Cough with weakness. EXAM: PORTABLE CHEST 1 VIEW COMPARISON:  09/20/2018 FINDINGS: Sequelae of CABG are again identified. A dual chamber pacemaker remains in place. The cardiac silhouette  remains enlarged. There is increased pulmonary vascular congestion with new patchy airspace opacity in both lung bases. No  sizable pleural effusion or pneumothorax is identified. No acute osseous abnormality is seen. IMPRESSION: Cardiomegaly with increased pulmonary vascular congestion and patchy bibasilar opacities which may reflect edema, pneumonia, or atelectasis. Electronically Signed   By: Logan Bores M.D.   On: 09/23/2018 16:47     Subjective: Pt without specific complaints.  Ate breakfast. Family at bedside.  Family feel they can safely manage him at home now and will provide 24/7 supervision.   Discharge Exam: Vitals:   09/27/18 2112 09/28/18 0658  BP: (!) 147/88 (!) 151/92  Pulse: 77 77  Resp: 19 17  Temp: 98 F (36.7 C) 97.6 F (36.4 C)  SpO2: 99% 100%   Vitals:   09/27/18 1500 09/27/18 2042 09/27/18 2112 09/28/18 0658  BP: 140/79  (!) 147/88 (!) 151/92  Pulse: 77  77 77  Resp: 18  19 17   Temp: 98 F (36.7 C)  98 F (36.7 C) 97.6 F (36.4 C)  TempSrc: Oral  Oral Oral  SpO2: 100% 98% 99% 100%  Weight:      Height:        General exam: Alert, awake, no distress, with dementia.  Respiratory system: Clear to auscultation. Respiratory effort normal. Cardiovascular system:normal s1,s2 sounds.  No murmurs, rubs, gallops. Gastrointestinal system: Abdomen is nondistended, soft and nontender. No organomegaly or masses felt. Normal bowel sounds heard. Central nervous system: Alert and oriented. No focal neurological deficits. Extremities: no pretibial edema.  Skin: No rashes, lesions or ulcers. Age related changes.  Psychiatry: Mildly confused, pleasant.    The results of significant diagnostics from this hospitalization (including imaging, microbiology, ancillary and laboratory) are listed below for reference.     Microbiology: Recent Results (from the past 240 hour(s))  Urine culture     Status: Abnormal   Collection Time: 09/20/18  8:20 PM  Result Value Ref Range  Status   Specimen Description   Final    URINE, CATHETERIZED Performed at [facility], [address] [place], [place] [address]    Special Requests   Final    NONE Performed at [facility], [address] [place], [place] [address]    Culture >=100,000 COLONIES/mL ESCHERICHIA COLI (A)  Final   Report Status 09/23/2018 FINAL  Final   Organism ID, Bacteria ESCHERICHIA COLI (A)  Final      Susceptibility   Escherichia coli - MIC*    AMPICILLIN >=32 RESISTANT Resistant     CEFAZOLIN <=4 SENSITIVE Sensitive     CEFTRIAXONE <=1 SENSITIVE Sensitive     CIPROFLOXACIN >=4 RESISTANT Resistant     GENTAMICIN <=1 SENSITIVE Sensitive     IMIPENEM <=0.25 SENSITIVE Sensitive     NITROFURANTOIN <=16 SENSITIVE Sensitive     TRIMETH/SULFA >=320 RESISTANT Resistant     AMPICILLIN/SULBACTAM 16 INTERMEDIATE Intermediate     PIP/TAZO <=4 SENSITIVE Sensitive     Extended ESBL NEGATIVE Sensitive     * >=100,000 COLONIES/mL ESCHERICHIA COLI  Culture, blood (routine x 2)     Status: None   Collection Time: 09/20/18  9:45 PM  Result Value Ref Range Status   Specimen Description RIGHT ANTECUBITAL  Final   Special Requests   Final    BOTTLES DRAWN AEROBIC AND ANAEROBIC Blood Culture adequate volume   Culture   Final    NO GROWTH 5 DAYS Performed at [facility], [address] [place], [place] [address]    Report Status 09/25/2018 FINAL  Final  Culture, blood (routine x 2)     Status:  None   Collection Time: 09/20/18  9:46 PM  Result Value Ref Range Status   Specimen Description LEFT ANTECUBITAL  Final   Special Requests   Final    BOTTLES DRAWN AEROBIC AND ANAEROBIC Blood Culture adequate volume   Culture   Final    NO GROWTH 5 DAYS Performed at [facility], [address] [place], [place] [address]    Report Status 09/25/2018 FINAL  Final     Labs: BNP (last 3 results) Recent Labs    07/06/18 1850 09/20/18 2020 09/26/18 0040  BNP 441.7* 498.0* 8,527.7*   Basic Metabolic  Panel: Recent Labs  Lab 09/24/18 0455 09/25/18 1102 09/26/18 0040 09/27/18 0555 09/28/18 0625  NA 138 137 139  138  137 138 139  K 4.6 4.2 4.8  4.4  4.4 3.7 3.8  CL 108 110 108  109  108 106 102  CO2 21* 19* 20*  19*  20* 23 25  GLUCOSE 116* 251* 68*  103*  105* 120* 186*  BUN 49* 54* 51*  52*  51* 50* 52*  CREATININE 2.16* 2.21* 2.04*  2.00*  2.04* 2.18* 2.17*  CALCIUM 8.6* 8.3* 8.3*  8.1*  8.2* 8.1* 8.6*  MG  --   --  2.1  --   --   PHOS  --  2.8 3.2  3.2 3.5 3.3   Liver Function Tests: Recent Labs  Lab 09/24/18 0455 09/25/18 1102 09/26/18 0040 09/27/18 0555 09/28/18 0625  AST 32  --   --   --   --   ALT 18  --   --   --   --   ALKPHOS 58  --   --   --   --   BILITOT 1.1  --   --   --   --   PROT 5.8*  --   --   --   --   ALBUMIN 2.6* 2.9* 3.0*  3.0* 2.7* 3.2*   No results for input(s): LIPASE, AMYLASE in the last 168 hours. No results for input(s): AMMONIA in the last 168 hours. CBC: Recent Labs  Lab 09/24/18 0455 09/26/18 0040  WBC 8.1 14.5*  NEUTROABS  --  13.0*  HGB 9.7* 10.9*  HCT 29.9* 35.1*  MCV 84.2 86.5  PLT 156 216   Cardiac Enzymes: Recent Labs  Lab 09/24/18 0455 09/26/18 0404 09/26/18 0953 09/26/18 1818  CKTOTAL 149  --   --   --   TROPONINI  --  0.10* 0.08* 0.07*   BNP: Invalid input(s): POCBNP CBG: Recent Labs  Lab 09/27/18 0755 09/27/18 1154 09/27/18 1656 09/27/18 2112 09/28/18 0753  GLUCAP 99 130* 106* 244* 193*   D-Dimer No results for input(s): DDIMER in the last 72 hours. Hgb A1c No results for input(s): HGBA1C in the last 72 hours. Lipid Profile No results for input(s): CHOL, HDL, LDLCALC, TRIG, CHOLHDL, LDLDIRECT in the last 72 hours. Thyroid function studies No results for input(s): TSH, T4TOTAL, T3FREE, THYROIDAB in the last 72 hours.  Invalid input(s): FREET3 Anemia work up Recent Labs    09/25/18 1102  TIBC 243*  IRON 19*   Urinalysis    Component Value Date/Time   COLORURINE YELLOW  09/20/2018 2020   APPEARANCEUR CLOUDY (A) 09/20/2018 2020   LABSPEC 1.013 09/20/2018 2020   PHURINE 5.0 09/20/2018 2020   GLUCOSEU NEGATIVE 09/20/2018 2020   HGBUR SMALL (A) 09/20/2018 2020   BILIRUBINUR NEGATIVE 09/20/2018 2020   [place] 09/20/2018 2020   PROTEINUR  30 (A) 09/20/2018 2020   NITRITE POSITIVE (A) 09/20/2018 2020   LEUKOCYTESUR LARGE (A) 09/20/2018 2020   Sepsis Labs Invalid input(s): PROCALCITONIN,  WBC,  LACTICIDVEN Microbiology Recent Results (from the past 240 hour(s))  Urine culture     Status: Abnormal   Collection Time: 09/20/18  8:20 PM  Result Value Ref Range Status   Specimen Description   Final    URINE, CATHETERIZED Performed at [facility], [address], [place], [place] [address]    Special Requests   Final    NONE Performed at [facility], [address] [place], [place] [address]    Culture >=100,000 COLONIES/mL ESCHERICHIA COLI (A)  Final   Report Status 09/23/2018 FINAL  Final   Organism ID, Bacteria ESCHERICHIA COLI (A)  Final      Susceptibility   Escherichia coli - MIC*    AMPICILLIN >=32 RESISTANT Resistant     CEFAZOLIN <=4 SENSITIVE Sensitive     CEFTRIAXONE <=1 SENSITIVE Sensitive     CIPROFLOXACIN >=4 RESISTANT Resistant     GENTAMICIN <=1 SENSITIVE Sensitive     IMIPENEM <=0.25 SENSITIVE Sensitive     NITROFURANTOIN <=16 SENSITIVE Sensitive     TRIMETH/SULFA >=320 RESISTANT Resistant     AMPICILLIN/SULBACTAM 16 INTERMEDIATE Intermediate     PIP/TAZO <=4 SENSITIVE Sensitive     Extended ESBL NEGATIVE Sensitive     * >=100,000 COLONIES/mL ESCHERICHIA COLI  Culture, blood (routine x 2)     Status: None   Collection Time: 09/20/18  9:45 PM  Result Value Ref Range Status   Specimen Description RIGHT ANTECUBITAL  Final   Special Requests   Final    BOTTLES DRAWN AEROBIC AND ANAEROBIC Blood Culture adequate volume   Culture   Final    NO GROWTH 5 DAYS Performed at [facility], [address], [place], [place]  [address]    Report Status 09/25/2018 FINAL  Final  Culture, blood (routine x 2)     Status: None   Collection Time: 09/20/18  9:46 PM  Result Value Ref Range Status   Specimen Description LEFT ANTECUBITAL  Final   Special Requests   Final    BOTTLES DRAWN AEROBIC AND ANAEROBIC Blood Culture adequate volume   Culture   Final    NO GROWTH 5 DAYS Performed at [facility], [address], [place], [place] [address]    Report Status 09/25/2018 FINAL  Final   Time coordinating discharge: 34 minutes   SIGNED:  Irwin Brakeman, MD  [facility] 09/28/2018, 9:20 AM How to contact the [facility] Attending or Consulting provider [place] or covering provider during after hours 7P -7A, for this patient?  1. Check the care team in [facility] and look for a) attending/consulting [facility] provider listed and b) the [facility] team listed 2. Log into [email or web address] and use [place]'s universal password to access. If you do not have the password, please contact the hospital operator. 3. Locate the [facility] provider you are looking for under [facility] and page to a number that you can be directly reached. 4. If you still have difficulty reaching the provider, please page the [facility] (Director on Call) for the Hospitalists listed on amion for assistance.

## 2018-09-28 NOTE — Discharge Instructions (Signed)
Acute Kidney Injury, Adult ° °Acute kidney injury is a sudden worsening of kidney function. The kidneys are organs that have several jobs. They filter the blood to remove waste products and extra fluid. They also maintain a healthy balance of minerals and hormones in the body, which helps control blood pressure and keep bones strong. With this condition, your kidneys do not do their jobs as well as they should. °This condition ranges from mild to severe. Over time it may develop into long-lasting (chronic) kidney disease. Early detection and treatment may prevent acute kidney injury from developing into a chronic condition. °What are the causes? °Common causes of this condition include: °· A problem with blood flow to the kidneys. This may be caused by: °? Low blood pressure (hypotension) or shock. °? Blood loss. °? Heart and blood vessel (cardiovascular) disease. °? Severe burns. °? Liver disease. °· Direct damage to the kidneys. This may be caused by: °? Certain medicines. °? A kidney infection. °? Poisoning. °? Being around or in contact with toxic substances. °? A surgical wound. °? A hard, direct hit to the kidney area. °· A sudden blockage of urine flow. This may be caused by: °? Cancer. °? Kidney stones. °? An enlarged prostate in males. °What are the signs or symptoms? °Symptoms of this condition may not be obvious until the condition becomes severe. Symptoms of this condition can include: °· Tiredness (lethargy), or difficulty staying awake. °· Nausea or vomiting. °· Swelling (edema) of the face, legs, ankles, or feet. °· Problems with urination, such as: °? Abdominal pain, or pain along the side of your stomach (flank). °? Decreased urine production. °? Decrease in the force of urine flow. °· Muscle twitches and cramps, especially in the legs. °· Confusion or trouble concentrating. °· Loss of appetite. °· Fever. °How is this diagnosed? °This condition may be diagnosed with tests, including: °· Blood  tests. °· Urine tests. °· Imaging tests. °· A test in which a sample of tissue is removed from the kidneys to be examined under a microscope (kidney biopsy). °How is this treated? °Treatment for this condition depends on the cause and how severe the condition is. In mild cases, treatment may not be needed. The kidneys may heal on their own. In more severe cases, treatment will involve: °· Treating the cause of the kidney injury. This may involve changing any medicines you are taking or adjusting your dosage. °· Fluids. You may need specialized IV fluids to balance your body's needs. °· Having a catheter placed to drain urine and prevent blockages. °· Preventing problems from occurring. This may mean avoiding certain medicines or procedures that can cause further injury to the kidneys. °In some cases treatment may also require: °· A procedure to remove toxic wastes from the body (dialysis or continuous renal replacement therapy - CRRT). °· Surgery. This may be done to repair a torn kidney, or to remove the blockage from the urinary system. °Follow these instructions at home: °Medicines °· Take over-the-counter and prescription medicines only as told by your health care provider. °· Do not take any new medicines without your health care provider's approval. Many medicines can worsen your kidney damage. °· Do not take any vitamin and mineral supplements without your health care provider's approval. Many nutritional supplements can worsen your kidney damage. °Lifestyle °· If your health care provider prescribed changes to your diet, follow them. You may need to decrease the amount of protein you eat. °· Achieve and maintain a healthy   weight. If you need help with this, ask your health care provider.  Start or continue an exercise plan. Try to exercise at least 30 minutes a day, 5 days a week.  Do not use any tobacco products, such as cigarettes, chewing tobacco, and e-cigarettes. If you need help quitting, ask your  health care provider. General instructions  Keep track of your blood pressure. Report changes in your blood pressure as told by your health care provider.  Stay up to date with immunizations. Ask your health care provider which immunizations you need.  Keep all follow-up visits as told by your health care provider. This is important. Where to find more information  American Association of Kidney Patients: BombTimer.gl  National Kidney Foundation: www.kidney.[place]: https://mathis.com/  [facility]: ? www.lifeoptions.org ? www.kidneyschool.org Contact a health care provider if:  Your symptoms get worse.  You develop new symptoms. Get help right away if:  You develop symptoms of worsening kidney disease, which include: ? Headaches. ? Abnormally dark or light skin. ? Easy bruising. ? Frequent hiccups. ? Chest pain. ? Shortness of breath. ? End of menstruation in women. ? Seizures. ? Confusion or altered mental status. ? Abdominal or back pain. ? Itchiness.  You have a fever.  Your body is producing less urine.  You have pain or bleeding when you urinate. Summary  Acute kidney injury is a sudden worsening of kidney function.  Acute kidney injury can be caused by problems with blood flow to the kidneys, direct damage to the kidneys, and sudden blockage of urine flow.  Symptoms of this condition may not be obvious until it becomes severe. Symptoms may include edema, lethargy, confusion, nausea or vomiting, and problems passing urine.  This condition can usually be diagnosed with blood tests, urine tests, and imaging tests. Sometimes a kidney biopsy is done to diagnose this condition.  Treatment for this condition often involves treating the underlying cause. It is treated with fluids, medicines, dialysis, diet changes, or surgery. This information is not intended to replace advice given to you by your health care provider. Make  sure you discuss any questions you have with your health care provider. Document Released: 01/09/2011 Document Revised: 10/26/2016 Document Reviewed: 06/16/2016 Elsevier Interactive Patient Education  2019 Else[employer] Inc.   Aspiration Precautions, Adult Aspiration is the breathing in (inhalation) of a liquid or object into the lungs. Things that can be inhaled into the lungs include:  Food.  Any type of liquid, such as drinks or saliva.  Stomach contents, such as vomit or stomach acid. What are the signs of aspiration? Signs of aspiration include:  Coughing after swallowing food or liquids.  Clearing the throat often while eating.  Trouble breathing. This may include: ? Breathing quickly. ? Breathing very slowly. ? Loud breathing. ? Rumbling sounds from the lungs while breathing.  Coughing up phlegm (sputum) that: ? Is yellow, tan, or green. ? Has pieces of food in it. ? Is bad-smelling.  Having a hoarse, barky cough.  Not being able to speak.  A hoarse voice.  Drooling while eating.  A feeling of fullness in the throat or a feeling that something is stuck in the throat.  Choking often.  Having a runny noise while eating.  Coughing when lying down or having to sit up quickly after lying down.  A change in skin color. The skin may look red or blue.  Fever.  Watery eyes.  Pain in the chest or back.  A pained look on the face. What are the complications of aspiration? Complications of aspiration include:  Losing weight because the person is not absorbing needed nutrients.  Loss of enjoyment and the social benefits of eating.  Choking.  Lung irritation, if someone aspirates acidic food or drinks.  Lung infection (pneumonia).  Collection of infected liquid (pus) in the lungs (lung abscess). In serious cases, death can occur. What can I do to prevent aspiration? Caring for someone who has a feeding tube If you are caring for someone who has a feeding  tube who cannot eat or drink safely through his or her mouth:  Keep the person in an upright position as much as possible.  Do not lay the person flat if he or she is getting continuous feedings. If you need to lay the person flat for any reason, turn the feeding pump off.  Check feeding tube residuals as told by your health care provider. Ask your health care provider what residual amount is too high. Caring for someone who can eat and drink safely by mouth If you are caring for someone who can eat and drink safely through his or her mouth:  Have the person sit in an upright position when eating food or drinking fluids. This can be done in two ways: ? Have the person sit up in a chair. ? If sitting in a chair is not possible, position the person in bed so he or she is upright.  Remind the person to eat slowly and chew well. Make sure the person is awake and alert while eating.  Do not distract the person. This is especially important for people with thinking or memory (cognitive) problems.  Allow foods to cool. Hot foods may be more difficult to swallow.  Provide small meals more frequently, instead of 3 large meals. This may reduce fatigue during eating.  Check the person's mouth thoroughly for leftover food after eating.  Keep the person sitting upright for 30-45 minutes after eating.  Do not serve food or drink during 2 hours or more before bedtime. General instructions Follow these general guidelines to prevent aspiration in someone who can eat and drink safely by mouth:  Never put food or liquids in the mouth of a person who is not fully alert.  Feed small amounts of food. Do not force feed.  For a person who is on a diet for swallowing difficulty (dysphagia diet), follow the recommended food and drink consistency. For example, in dysphagia diet level 1, thicken liquids to pudding-like consistency.  Use as little water as possible when brushing the person's teeth or cleaning  his or her mouth.  Provide oral care before and after meals.  Use adaptive devices such as cut-out cups, straws, or utensils as told by the health care provider.  Crush pills and put them in soft food such as pudding or ice cream. Some pills should not be crushed. Check with the health care provider before crushing any medicine. Contact a health care provider if:  The person has a feeding tube, and the feeding tube residual amount is too high.  The person has a fever.  The person tries to avoid food or water, such as refusing to eat, drink, or be fed, or is eating less than normal.  The person may have aspirated food or liquid.  You notice warning signs, such as choking or coughing, when the person eats or drinks. Get help right away if:  The person has trouble breathing  or starts to breathe quickly.  The person is breathing very slowly or stops breathing.  The person coughs a lot after eating or drinking.  The person has a long-lasting (chronic) cough.  The person coughs up thick, yellow, or tan sputum.  If someone is choking on food or an object, perform the Heimlich maneuver (abdominal thrusts).  The person has symptoms of pneumonia, such as: ? Coughing a lot. ? Coughing up mucus with a bad smell or blood in it. ? Feeling short of breath. ? Complaining of chest pain. ? Sweating, fever, and chills. ? Feeling tired. ? Complaining of trouble breathing. ? Wheezing.  The person cannot stop choking.  The person is unable to breathe, turns blue, faints, or seems confused. These symptoms may represent a serious problem that is an emergency. Do not wait to see if the symptoms will go away. Get medical help right away. Call your local emergency services (911 in the [place]  Summary  Aspiration is the breathing in (inhalation) of a liquid or object into the lungs. Things that can be inhaled into the lungs include food, liquids, saliva, or stomach contents.  Aspiration can  cause pneumonia or choking.  One sign of aspiration is coughing after swallowing food or liquids.  Contact a health care provider if you notice signs of aspiration. This information is not intended to replace advice given to you by your health care provider. Make sure you discuss any questions you have with your health care provider. Document Released: 07/29/2010 Document Revised: 03/23/2016 Document Reviewed: 03/23/2016 Elsevier Interactive Patient Education  2019 [place]. Aspiration Pneumonia Aspiration pneumonia is an infection in the lungs. It occurs when saliva or liquid contaminated with bacteria is inhaled (aspirated) into the lungs. When these things get into the lungs, swelling (inflammation) and infection can occur. This can make it difficult to breathe. Aspiration pneumonia is a serious condition and can be life threatening. What are the causes? This condition is caused when saliva or liquid from the mouth, throat, or stomach is inhaled into the lungs, and when those fluids are contaminated with bacteria. What increases the risk? The following factors may make you more likely to develop this condition:  A narrowing of the tube that carries food to the stomach (esophageal narrowing).  Having gastroesophageal reflux disease (GERD).  Having a weak immune system.  Having diabetes.  Having poor oral hygiene.  Being malnourished. The condition is more likely to occur when a person's cough (gag) reflex, or ability to swallow, has decreased. Some things that can cause this decrease include:  Having a brain injury or disease, such as stroke, seizures, Parkinson disease, dementia, or amyotrophic lateral sclerosis (ALS).  Being given a general anesthetic for procedures.  Drinking too much alcohol. If a person passes out and vomits, vomit can be inhaled into the lungs.  Taking certain medicines, such as tranquilizers or sedatives. What are the signs or symptoms? Symptoms of  this condition include:  Fever.  A cough with secretions that are yellow, tan, or green.  Breathing problems, such as wheezing or shortness of breath.  Chest pain.  Being more tired than usual (fatigue).  Having a history of coughing while eating or drinking.  Bad breath.  Bluish color to the lips, skin, or fingers. How is this diagnosed? This condition may be diagnosed based on:  A physical exam.  Tests, such as: ? Chest X-ray. ? Sputum culture. Saliva and mucus (sputum) are collected from the lungs or the tubes  that carry air to the lungs (bronchi). The sputum is then tested for bacteria. ? Oximetry. A sensor or clip is placed on areas such as a finger, earlobe, or toe to measure the oxygen level in your blood. ? Blood tests. ? Swallowing study. This test looks at how food is swallowed and whether it goes into your breathing tube (trachea) or esophagus. ? Bronchoscopy. This test uses a flexible tube (bronchoscope) to see inside the lungs. How is this treated? This condition may be treated with:  Medicines. Antibiotic medicine will be given to kill the pneumonia bacteria. Other medicines may also be used to reduce fever or pain.  Breathing assistance and oxygen therapy. Depending on how well you are breathing, you may need to be given oxygen, or you may need breathing support from a breathing machine (ventilator).  Thoracentesis. This is a procedure to remove fluid that has built up in the space between the linings of the chest wall and the lungs.  Feeding tube and diet change. For people who have difficulty swallowing, a feeding tube might be placed in the stomach, or they may be asked to avoid certain food textures or liquids when eating. Follow these instructions at home: Medicines  Take over-the-counter and prescription medicines only as told by your health care provider. ? If you were prescribed an antibiotic medicine, take it as told by your health care provider. Do  not stop taking the antibiotic even if you start to feel better. ? Take cough medicine only if you are losing sleep. Cough medicine can prevent your bodys natural ability to remove mucus from your lungs. General instructions  Carefully follow any eating instructions you were given, such as avoiding certain food textures or thickening your liquids. Thickening liquids reduces the risk of developing aspiration pneumonia again.  Use breathing exercises such as postural drainage, deep breathing, and incentive spirometry to help expel secretions.  Rest as instructed by your health care provider.  Sleep in a semi-upright position at night. Try to sleep in a reclining chair, or place a few pillows under your head.  Do not use any products that contain nicotine or tobacco, such as cigarettes and e-cigarettes. If you need help quitting, ask your health care provider.  Keep all follow-up visits as told by your health care provider. This is important. Contact a health care provider if:  You have a fever.  You have a worsening cough with yellow, tan, or green secretions.  You have coughing while eating or drinking. Get help right away if:  You have worsening shortness of breath, wheezing, or difficulty breathing.  You have chest pain. Summary  Aspiration pneumonia is an infection in the lungs. It is caused when saliva or liquid from the mouth, throat, or stomach is inhaled into the lungs.  Aspiration pneumonia is more likely to occur when a person's cough reflex or ability to swallow has decreased.  Symptoms of aspiration pneumonia include coughing, breathing problems, fever, and chest pain.  Aspiration pneumonia may be treated with antibiotic medicine, other medicines to reduce pain or fever, and breathing assistance or oxygen therapy. This information is not intended to replace advice given to you by your health care provider. Make sure you discuss any questions you have with your health  care provider. Document Released: 04/23/2009 Document Revised: 08/01/2016 Document Reviewed: 08/01/2016 Elsevier Interactive Patient Education  2019 [place] Prevention in the Home, Adult Falls can cause injuries. They can happen to people of all ages.  There are many things you can do to make your home safe and to help prevent falls. Ask for help when making these changes, if needed. What actions can I take to prevent falls? General Instructions  Use good lighting in all rooms. Replace any light bulbs that burn out.  Turn on the lights when you go into a dark area. Use night-lights.  Keep items that you use often in easy-to-reach places. Lower the shelves around your home if necessary.  Set up your furniture so you have a clear path. Avoid moving your furniture around.  Do not have throw rugs and other things on the floor that can make you trip.  Avoid walking on wet floors.  If any of your floors are uneven, fix them.  Add color or contrast paint or tape to clearly mark and help you see: ? Any grab bars or handrails. ? First and last steps of stairways. ? Where the edge of each step is.  If you use a stepladder: ? Make sure that it is fully opened. Do not climb a closed stepladder. ? Make sure that both sides of the stepladder are locked into place. ? Ask someone to hold the stepladder for you while you use it.  If there are any pets around you, be aware of where they are. What can I do in the bathroom?      Keep the floor dry. Clean up any water that spills onto the floor as soon as it happens.  Remove soap buildup in the tub or shower regularly.  Use non-skid mats or decals on the floor of the tub or shower.  Attach bath mats securely with double-sided, non-slip rug tape.  If you need to sit down in the shower, use a plastic, non-slip stool.  Install grab bars by the toilet and in the tub and shower. Do not use towel bars as grab bars. What can I do  in the bedroom?  Make sure that you have a light by your bed that is easy to reach.  Do not use any sheets or blankets that are too big for your bed. They should not hang down onto the floor.  Have a firm chair that has side arms. You can use this for support while you get dressed. What can I do in the kitchen?  Clean up any spills right away.  If you need to reach something above you, use a strong step stool that has a grab bar.  Keep electrical cords out of the way.  Do not use floor polish or wax that makes floors slippery. If you must use wax, use non-skid floor wax. What can I do with my stairs?  Do not leave any items on the stairs.  Make sure that you have a light switch at the top of the stairs and the bottom of the stairs. If you do not have them, ask someone to add them for you.  Make sure that there are handrails on both sides of the stairs, and use them. Fix handrails that are broken or loose. Make sure that handrails are as long as the stairways.  Install non-slip stair treads on all stairs in your home.  Avoid having throw rugs at the top or bottom of the stairs. If you do have throw rugs, attach them to the floor with carpet tape.  Choose a carpet that does not hide the edge of the steps on the stairway.  Check any carpeting to make sure that  it is firmly attached to the stairs. Fix any carpet that is loose or worn. What can I do on the outside of my home?  Use bright outdoor lighting.  Regularly fix the edges of walkways and driveways and fix any cracks.  Remove anything that might make you trip as you walk through a door, such as a raised step or threshold.  Trim any bushes or trees on the path to your home.  Regularly check to see if handrails are loose or broken. Make sure that both sides of any steps have handrails.  Install guardrails along the edges of any raised decks and porches.  Clear walking paths of anything that might make someone trip, such  as tools or rocks.  Have any leaves, snow, or ice cleared regularly.  Use sand or salt on walking paths during winter.  Clean up any spills in your garage right away. This includes grease or oil spills. What other actions can I take?  Wear shoes that: ? Have a low heel. Do not wear high heels. ? Have rubber bottoms. ? Are comfortable and fit you well. ? Are closed at the toe. Do not wear open-toe sandals.  Use tools that help you move around (mobility aids) if they are needed. These include: ? Canes. ? Walkers. ? Scooters. ? Crutches.  Review your medicines with your doctor. Some medicines can make you feel dizzy. This can increase your chance of falling. Ask your doctor what other things you can do to help prevent falls. Where to find more information  Centers for Disease Control and Prevention, [place]: https://garcia.biz/  [employer] on Aging: BrainJudge.co.uk Contact a doctor if:  You are afraid of falling at home.  You feel weak, drowsy, or dizzy at home.  You fall at home. Summary  There are many simple things that you can do to make your home safe and to help prevent falls.  Ways to make your home safe include removing tripping hazards and installing grab bars in the bathroom.  Ask for help when making these changes in your home. This information is not intended to replace advice given to you by your health care provider. Make sure you discuss any questions you have with your health care provider. Document Released: 04/22/2009 Document Revised: 02/08/2017 Document Reviewed: 02/08/2017 Elsevier Interactive Patient Education  2019 [employer].

## 2018-09-28 NOTE — Progress Notes (Signed)
DC instructions given to pt and daughter Marliss Czar, w/ med changes, FU appointment, diet, activity. IV discontinued. Assisted w/ clothing. No distress noted. Transported via [place] w/ daughter.

## 2018-09-29 DIAGNOSIS — I130 Hypertensive heart and chronic kidney disease with heart failure and stage 1 through stage 4 chronic kidney disease, or unspecified chronic kidney disease: Secondary | ICD-10-CM | POA: Diagnosis not present

## 2018-09-29 DIAGNOSIS — E1122 Type 2 diabetes mellitus with diabetic chronic kidney disease: Secondary | ICD-10-CM | POA: Diagnosis not present

## 2018-09-29 DIAGNOSIS — F0390 Unspecified dementia without behavioral disturbance: Secondary | ICD-10-CM | POA: Diagnosis not present

## 2018-09-29 DIAGNOSIS — I5022 Chronic systolic (congestive) heart failure: Secondary | ICD-10-CM | POA: Diagnosis not present

## 2018-09-29 DIAGNOSIS — I255 Ischemic cardiomyopathy: Secondary | ICD-10-CM | POA: Diagnosis not present

## 2018-09-29 DIAGNOSIS — I2510 Atherosclerotic heart disease of native coronary artery without angina pectoris: Secondary | ICD-10-CM | POA: Diagnosis not present

## 2018-09-30 DIAGNOSIS — I5022 Chronic systolic (congestive) heart failure: Secondary | ICD-10-CM | POA: Diagnosis not present

## 2018-09-30 DIAGNOSIS — I130 Hypertensive heart and chronic kidney disease with heart failure and stage 1 through stage 4 chronic kidney disease, or unspecified chronic kidney disease: Secondary | ICD-10-CM | POA: Diagnosis not present

## 2018-09-30 DIAGNOSIS — I255 Ischemic cardiomyopathy: Secondary | ICD-10-CM | POA: Diagnosis not present

## 2018-09-30 DIAGNOSIS — F0390 Unspecified dementia without behavioral disturbance: Secondary | ICD-10-CM | POA: Diagnosis not present

## 2018-09-30 DIAGNOSIS — I2510 Atherosclerotic heart disease of native coronary artery without angina pectoris: Secondary | ICD-10-CM | POA: Diagnosis not present

## 2018-09-30 DIAGNOSIS — E1122 Type 2 diabetes mellitus with diabetic chronic kidney disease: Secondary | ICD-10-CM | POA: Diagnosis not present

## 2018-10-01 DIAGNOSIS — K5900 Constipation, unspecified: Secondary | ICD-10-CM | POA: Diagnosis not present

## 2018-10-01 DIAGNOSIS — Z681 Body mass index (BMI) 19 or less, adult: Secondary | ICD-10-CM | POA: Diagnosis not present

## 2018-10-01 DIAGNOSIS — R531 Weakness: Secondary | ICD-10-CM | POA: Diagnosis not present

## 2018-10-01 DIAGNOSIS — N183 Chronic kidney disease, stage 3 (moderate): Secondary | ICD-10-CM | POA: Diagnosis not present

## 2018-10-01 DIAGNOSIS — I5032 Chronic diastolic (congestive) heart failure: Secondary | ICD-10-CM | POA: Diagnosis not present

## 2018-10-01 DIAGNOSIS — I4891 Unspecified atrial fibrillation: Secondary | ICD-10-CM | POA: Diagnosis not present

## 2018-10-01 DIAGNOSIS — R1310 Dysphagia, unspecified: Secondary | ICD-10-CM | POA: Diagnosis not present

## 2018-10-02 ENCOUNTER — Other Ambulatory Visit: Payer: Self-pay

## 2018-10-02 DIAGNOSIS — E1122 Type 2 diabetes mellitus with diabetic chronic kidney disease: Secondary | ICD-10-CM | POA: Diagnosis not present

## 2018-10-02 DIAGNOSIS — I130 Hypertensive heart and chronic kidney disease with heart failure and stage 1 through stage 4 chronic kidney disease, or unspecified chronic kidney disease: Secondary | ICD-10-CM | POA: Diagnosis not present

## 2018-10-02 DIAGNOSIS — I255 Ischemic cardiomyopathy: Secondary | ICD-10-CM | POA: Diagnosis not present

## 2018-10-02 DIAGNOSIS — I2510 Atherosclerotic heart disease of native coronary artery without angina pectoris: Secondary | ICD-10-CM | POA: Diagnosis not present

## 2018-10-02 DIAGNOSIS — I5022 Chronic systolic (congestive) heart failure: Secondary | ICD-10-CM | POA: Diagnosis not present

## 2018-10-02 DIAGNOSIS — F0390 Unspecified dementia without behavioral disturbance: Secondary | ICD-10-CM | POA: Diagnosis not present

## 2018-10-02 NOTE — Patient Outreach (Signed)
[facility]) Care Management  10/02/2018  GARDINER ESPANA 16-Jun-1929 [id number]   Follow up outreach to discuss Mr. Casciano's status since hospital discharge. Spoke with member's spouse [name]. Reports he was doing well. Caregiver [name] was not available.  Agreed to inform [name] to return call with updates.  PLAN Will update plan after discussion with caregiver.    [place] [phone]

## 2018-10-03 ENCOUNTER — Encounter (HOSPITAL_COMMUNITY): Payer: Self-pay

## 2018-10-03 ENCOUNTER — Emergency Department (HOSPITAL_COMMUNITY): Payer: Medicare Other

## 2018-10-03 ENCOUNTER — Other Ambulatory Visit: Payer: Self-pay

## 2018-10-03 ENCOUNTER — Emergency Department (HOSPITAL_COMMUNITY)
Admission: EM | Admit: 2018-10-03 | Discharge: 2018-10-03 | Disposition: A | Discharge status: Home / Self Care | Payer: Medicare Other | Attending: Emergency Medicine | Admitting: Emergency Medicine

## 2018-10-03 DIAGNOSIS — N183 Chronic kidney disease, stage 3 (moderate): Secondary | ICD-10-CM | POA: Diagnosis not present

## 2018-10-03 DIAGNOSIS — R4020 Unspecified coma: Secondary | ICD-10-CM | POA: Diagnosis not present

## 2018-10-03 DIAGNOSIS — E1122 Type 2 diabetes mellitus with diabetic chronic kidney disease: Secondary | ICD-10-CM | POA: Diagnosis not present

## 2018-10-03 DIAGNOSIS — R531 Weakness: Secondary | ICD-10-CM | POA: Diagnosis not present

## 2018-10-03 DIAGNOSIS — I6601 Occlusion and stenosis of right middle cerebral artery: Secondary | ICD-10-CM | POA: Diagnosis not present

## 2018-10-03 DIAGNOSIS — Z79899 Other long term (current) drug therapy: Secondary | ICD-10-CM | POA: Insufficient documentation

## 2018-10-03 DIAGNOSIS — R000 Tachycardia, unspecified: Secondary | ICD-10-CM | POA: Diagnosis not present

## 2018-10-03 DIAGNOSIS — R339 Retention of urine, unspecified: Secondary | ICD-10-CM | POA: Diagnosis not present

## 2018-10-03 DIAGNOSIS — R404 Transient alteration of awareness: Secondary | ICD-10-CM | POA: Diagnosis not present

## 2018-10-03 DIAGNOSIS — I2510 Atherosclerotic heart disease of native coronary artery without angina pectoris: Secondary | ICD-10-CM | POA: Diagnosis not present

## 2018-10-03 DIAGNOSIS — G8190 Hemiplegia, unspecified affecting unspecified side: Secondary | ICD-10-CM | POA: Diagnosis not present

## 2018-10-03 DIAGNOSIS — Z7401 Bed confinement status: Secondary | ICD-10-CM | POA: Diagnosis not present

## 2018-10-03 DIAGNOSIS — J90 Pleural effusion, not elsewhere classified: Secondary | ICD-10-CM | POA: Diagnosis not present

## 2018-10-03 DIAGNOSIS — I639 Cerebral infarction, unspecified: Secondary | ICD-10-CM | POA: Insufficient documentation

## 2018-10-03 DIAGNOSIS — E785 Hyperlipidemia, unspecified: Secondary | ICD-10-CM | POA: Diagnosis not present

## 2018-10-03 DIAGNOSIS — R0902 Hypoxemia: Secondary | ICD-10-CM | POA: Diagnosis not present

## 2018-10-03 DIAGNOSIS — I509 Heart failure, unspecified: Secondary | ICD-10-CM | POA: Diagnosis not present

## 2018-10-03 DIAGNOSIS — R52 Pain, unspecified: Secondary | ICD-10-CM | POA: Diagnosis not present

## 2018-10-03 DIAGNOSIS — Z951 Presence of aortocoronary bypass graft: Secondary | ICD-10-CM | POA: Diagnosis not present

## 2018-10-03 DIAGNOSIS — M6281 Muscle weakness (generalized): Secondary | ICD-10-CM | POA: Diagnosis not present

## 2018-10-03 DIAGNOSIS — Z466 Encounter for fitting and adjustment of urinary device: Secondary | ICD-10-CM | POA: Diagnosis not present

## 2018-10-03 DIAGNOSIS — Z743 Need for continuous supervision: Secondary | ICD-10-CM | POA: Diagnosis not present

## 2018-10-03 DIAGNOSIS — I700 Atherosclerosis of aorta: Secondary | ICD-10-CM | POA: Diagnosis not present

## 2018-10-03 DIAGNOSIS — F0390 Unspecified dementia without behavioral disturbance: Secondary | ICD-10-CM | POA: Diagnosis not present

## 2018-10-03 DIAGNOSIS — Z950 Presence of cardiac pacemaker: Secondary | ICD-10-CM | POA: Diagnosis not present

## 2018-10-03 DIAGNOSIS — M2550 Pain in unspecified joint: Secondary | ICD-10-CM | POA: Diagnosis not present

## 2018-10-03 DIAGNOSIS — I255 Ischemic cardiomyopathy: Secondary | ICD-10-CM | POA: Diagnosis not present

## 2018-10-03 DIAGNOSIS — I130 Hypertensive heart and chronic kidney disease with heart failure and stage 1 through stage 4 chronic kidney disease, or unspecified chronic kidney disease: Secondary | ICD-10-CM | POA: Diagnosis not present

## 2018-10-03 DIAGNOSIS — M1990 Unspecified osteoarthritis, unspecified site: Secondary | ICD-10-CM | POA: Diagnosis not present

## 2018-10-03 DIAGNOSIS — R29810 Facial weakness: Secondary | ICD-10-CM | POA: Diagnosis not present

## 2018-10-03 DIAGNOSIS — I6503 Occlusion and stenosis of bilateral vertebral arteries: Secondary | ICD-10-CM | POA: Diagnosis not present

## 2018-10-03 DIAGNOSIS — I6523 Occlusion and stenosis of bilateral carotid arteries: Secondary | ICD-10-CM | POA: Diagnosis not present

## 2018-10-03 DIAGNOSIS — I69398 Other sequelae of cerebral infarction: Secondary | ICD-10-CM | POA: Diagnosis not present

## 2018-10-03 DIAGNOSIS — I5022 Chronic systolic (congestive) heart failure: Secondary | ICD-10-CM | POA: Insufficient documentation

## 2018-10-03 DIAGNOSIS — I69322 Dysarthria following cerebral infarction: Secondary | ICD-10-CM | POA: Diagnosis not present

## 2018-10-03 DIAGNOSIS — I499 Cardiac arrhythmia, unspecified: Secondary | ICD-10-CM | POA: Diagnosis not present

## 2018-10-03 DIAGNOSIS — I69318 Other symptoms and signs involving cognitive functions following cerebral infarction: Secondary | ICD-10-CM | POA: Diagnosis not present

## 2018-10-03 DIAGNOSIS — I4821 Permanent atrial fibrillation: Secondary | ICD-10-CM | POA: Diagnosis not present

## 2018-10-03 LAB — CBC
HCT: 35.5 % — ABNORMAL LOW (ref 39.0–52.0)
Hemoglobin: 11.3 g/dL — ABNORMAL LOW (ref 13.0–17.0)
MCH: 27.2 pg (ref 26.0–34.0)
MCHC: 31.8 g/dL (ref 30.0–36.0)
MCV: 85.3 fL (ref 80.0–100.0)
Platelets: 200 10*3/uL (ref 150–400)
RBC: 4.16 MIL/uL — ABNORMAL LOW (ref 4.22–5.81)
RDW: 17.6 % — ABNORMAL HIGH (ref 11.5–15.5)
WBC: 8.1 10*3/uL (ref 4.0–10.5)
nRBC: 0 % (ref 0.0–0.2)

## 2018-10-03 LAB — DIFFERENTIAL
Abs Immature Granulocytes: 0.04 10*3/uL (ref 0.00–0.07)
Basophils Absolute: 0 10*3/uL (ref 0.0–0.1)
Basophils Relative: 0 %
Eosinophils Absolute: 0.2 10*3/uL (ref 0.0–0.5)
Eosinophils Relative: 2 %
Immature Granulocytes: 1 %
Lymphocytes Relative: 17 %
Lymphs Abs: 1.4 10*3/uL (ref 0.7–4.0)
MONO ABS: 0.5 10*3/uL (ref 0.1–1.0)
Monocytes Relative: 6 %
NEUTROS ABS: 6 10*3/uL (ref 1.7–7.7)
Neutrophils Relative %: 74 %

## 2018-10-03 LAB — COMPREHENSIVE METABOLIC PANEL
ALT: 18 U/L (ref 0–44)
AST: 18 U/L (ref 15–41)
Albumin: 3.4 g/dL — ABNORMAL LOW (ref 3.5–5.0)
Alkaline Phosphatase: 75 U/L (ref 38–126)
Anion gap: 11 (ref 5–15)
BUN: 36 mg/dL — ABNORMAL HIGH (ref 8–23)
CO2: 26 mmol/L (ref 22–32)
Calcium: 8.7 mg/dL — ABNORMAL LOW (ref 8.9–10.3)
Chloride: 99 mmol/L (ref 98–111)
Creatinine, Ser: 2.12 mg/dL — ABNORMAL HIGH (ref 0.61–1.24)
GFR calc Af Amer: 31 mL/min — ABNORMAL LOW (ref >60)
GFR calc non Af Amer: 27 mL/min — ABNORMAL LOW (ref >60)
Glucose, Bld: 108 mg/dL — ABNORMAL HIGH (ref 70–99)
Potassium: 3.6 mmol/L (ref 3.5–5.1)
Sodium: 136 mmol/L (ref 135–145)
Total Bilirubin: 1.1 mg/dL (ref 0.3–1.2)
Total Protein: 7 g/dL (ref 6.5–8.1)

## 2018-10-03 LAB — URINALYSIS, ROUTINE W REFLEX MICROSCOPIC
Bilirubin Urine: NEGATIVE
GLUCOSE, UA: NEGATIVE mg/dL
Ketones, ur: NEGATIVE mg/dL
Nitrite: NEGATIVE
PROTEIN: 100 mg/dL — AB
RBC / HPF: >50 RBC/hpf — ABNORMAL HIGH (ref 0–5)
Specific Gravity, Urine: 1.017 (ref 1.005–1.030)
WBC, UA: >50 WBC/hpf — ABNORMAL HIGH (ref 0–5)
pH: 5 (ref 5.0–8.0)

## 2018-10-03 LAB — PROTIME-INR
INR: 1.2 (ref 0.8–1.2)
Prothrombin Time: 14.7 seconds (ref 11.4–15.2)

## 2018-10-03 LAB — CBG MONITORING, ED: Glucose-Capillary: 105 mg/dL — ABNORMAL HIGH (ref 70–99)

## 2018-10-03 LAB — APTT: aPTT: 33 seconds (ref 24–36)

## 2018-10-03 MED ORDER — SODIUM CHLORIDE 0.9 % IV BOLUS
250.0000 mL | Freq: Once | INTRAVENOUS | Status: AC
  Administered 2018-10-03: 250 mL via INTRAVENOUS

## 2018-10-03 MED ORDER — IOHEXOL 350 MG/ML SOLN
90.0000 mL | Freq: Once | INTRAVENOUS | Status: AC | PRN
  Administered 2018-10-03: 90 mL via INTRAVENOUS

## 2018-10-03 NOTE — ED Provider Notes (Signed)
Patient presented as an EMS transfer from [facility]. 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 He was evaluated by neurology on ED arrival. Decision has been made to transition the patient to comfort care. Patient care transferred pending social work evaluation.   Quintella Reichert, MD 10/03/18 [phone]

## 2018-10-03 NOTE — ED Provider Notes (Signed)
[facility] EMERGENCY DEPARTMENT Provider Note   CSN: [id number] Arrival date & time: 10/03/18  1016    History   Chief Complaint Chief Complaint  Patient presents with   Code Stroke    HPI Neil [name] is a 85 y.o. male.     HPI Level 5 caveat.  Patient with history of dementia presents by EMS as code stroke.  Per EMS patient was restless overnight and did not sleep much.  Family witnessed him slumping over and becoming unresponsive around 9:30 AM.  When EMS arrived states he had dense right-sided weakness and was moving his left side.  States he is started moving his right side more en route.  Patient was recently started on Eliquis for atrial fibrillation. Past Medical History:  Diagnosis Date   Chronic kidney disease (CKD), stage III (moderate) (HCC)    Chronic systolic CHF (congestive heart failure) (HCC)    Complete heart block (HCC)    Coronary artery disease    status post [id number]   Dementia (HCC)    Dyslipidemia    Foley catheter in place    Hypertension    Ischemic cardiomyopathy    per last echo 11-10-2017  ef 15-20%   Osteoarthritis    Permanent atrial fibrillation    Presence of permanent cardiac pacemaker cardiologist-  dr Samuel Bouche Jude status post generator replacement 2009 (original placement 02/ 1998)   Renal artery stenosis (HCC)    95%  left, 50% right   Requires assistance with activities of daily living (ADL)    S/P CABG x 4 08/1996   Type 2 diabetes mellitus ([place])    Urinary retention    Uses wheelchair     Patient Active Problem List   Diagnosis Date Noted   Influenza B 79/39/0300   Systolic congestive heart failure ([place])    Palliative care encounter    Goals of care, counseling/discussion    Aspiration pneumonia ([place]) 02/25/2018   UTI (urinary tract infection) 02/25/2018   Protein-calorie malnutrition, severe 12/18/2017   Hypoglycemia due to type 2 diabetes mellitus ([place]) 12/17/2017   Confusion     General weakness    Dehydration 11/09/2017   FTT (failure to thrive) in adult 11/09/2017   AKI (acute kidney injury) ([place]) 11/09/2017   Dementia ([place]) 11/09/2017   Renal artery stenosis (HCC)    CKD (chronic kidney disease) stage 3, GFR 30-59 ml/min (HCC)    Pacemaker-St. Jude 04/20/2011   Permanent atrial fibrillation 05/03/2010   AV BLOCK, COMPLETE 11/02/2009   ABDOMINAL PAIN-EPIGASTRIC 10/27/2008   AODM 07/25/2008   Type 2 diabetes mellitus with hyperlipidemia ([place]) 07/25/2008   Cardiomyopathy, ischemic 07/25/2008   ATHEROSCLEROSIS OF RENAL ARTERY 07/25/2008   HYPERTENSION, BENIGN 07/25/2008    Past Surgical History:  Procedure Laterality Date    suspicous skin lesions-nose and upper back-removed 04/15/18     CARDIOVASCULAR STRESS TEST  05-22-2012   dr Caryl Comes   Low risk adenosine no exercise nuclear study w/ no ischemia/  ef 31%,  LV moderately enlarged , global hypokinesis and severe hypokinesis of the inferior wall   CORONARY ANGIOPLASTY WITH STENT PLACEMENT  spring 1998   stenting   CORONARY ARTERY BYPASS GRAFT  fall 1998   x4   INSERTION OF SUPRAPUBIC CATHETER N/A 04/16/2018   Procedure: INSERTION OF SUPRAPUBIC CATHETER;  Surgeon: Irine Seal, MD;  Location: [facility]S;  Service: Urology;  Laterality: N/A;   PACEMAKER GENERATOR CHANGE  10-30-2007  dr Samuel Bouche Jude  dual chamber   PACEMAKER INSERTION  02/ 1998   dr Caryl Comes   skin lesions     TRANSTHORACIC ECHOCARDIOGRAM  11/10/2017   mild LVH, ef 15-20%, diffuse hypokinesis, LVDF not evaluated/  mild AS/  severe LAE/  RVSF severely reduced/  mild MR, PR, and TR/  PASP 38mmHg        Home Medications    Prior to Admission medications   Medication Sig Start Date End Date Taking? Authorizing Provider  albuterol (PROVENTIL) (2.5 MG/3ML) 0.083% nebulizer solution Take 3 mLs (2.5 mg total) by nebulization every 6 (six) hours as needed for wheezing or shortness of breath. 02/27/18   Kathie Dike, MD    apixaban (ELIQUIS) 2.5 MG TABS tablet Take 1 tablet (2.5 mg total) by mouth 2 (two) times daily for 30 days. 09/28/18 10/28/18  Johnson, Clanford L, MD  atorvastatin (LIPITOR) 40 MG tablet Take 40 mg by mouth daily at 12 noon.     [provider]  doxycycline (VIBRAMYCIN) 100 MG capsule Take 1 capsule (100 mg total) by mouth daily for 5 days. 09/28/18 10/03/18  Johnson, Clanford L, MD  feeding supplement, ENSURE ENLIVE, (ENSURE ENLIVE) LIQD Take 237 mLs by mouth 2 (two) times daily between meals. Patient taking differently: Take 237 mLs by mouth daily after breakfast.  12/18/17   Manuella Ghazi, Pratik D, DO  FEROSUL 325 (65 Fe) MG tablet Take 325 mg by mouth daily.  03/14/18   [provider]  furosemide (LASIX) 40 MG tablet Take 1 tablet (40 mg total) by mouth daily. 08/04/18   Varney Biles, MD  glipiZIDE (GLUCOTROL XL) 2.5 MG 24 hr tablet Take 2.5-5 mg by mouth as directed. Depends on blood sugar. Checks twice daily. 03/12/18   [provider]  guaiFENesin (MUCINEX) 600 MG 12 hr tablet Take 600 mg by mouth 2 (two) times daily.    [provider]  lactobacillus acidophilus & bulgar (LACTINEX) chewable tablet Chew 2 tablets by mouth at bedtime. 08/10/18   Terrilee Croak, MD  levothyroxine (SYNTHROID, LEVOTHROID) 25 MCG tablet Take 25 mcg by mouth daily at 12 noon.  04/13/18   [provider]  Maltodextrin-Xanthan Gum (RESOURCE THICKENUP CLEAR) POWD Thicken fluids to nectar thick 02/27/18   Kathie Dike, MD  mirabegron ER (MYRBETRIQ) 25 MG TB24 tablet Take 1 tablet (25 mg total) by mouth daily for 30 days. 09/28/18 10/28/18  Johnson, Clanford L, MD  pantoprazole (PROTONIX) 40 MG tablet Take 40 mg by mouth daily at 12 noon.     [provider]  polyethylene glycol (MIRALAX) packet Take 17 g by mouth daily as needed for moderate constipation. 09/28/18   Johnson, Clanford L, MD  QUEtiapine (SEROQUEL) 50 MG tablet Take 50 mg by mouth at bedtime.    [provider]  rOPINIRole (REQUIP) 0.5 MG tablet Take 1 mg by mouth at bedtime.     [provider]  sulfamethoxazole-trimethoprim (BACTRIM,SEPTRA) 400-80 MG tablet Take 1 tablet by mouth at bedtime. 08/30/18   [provider]    Family History Family History  Problem Relation Age of Onset   Other Mother    Cancer Mother        breast   Alzheimer's disease Sister     Social History Social History   Tobacco Use   Smoking status: Never Smoker   Smokeless tobacco: Never Used  Substance Use Topics   Alcohol use: No   Drug use: No     Allergies   Cephalexin and  Penicillins   Review of Systems Review of Systems  Unable to perform ROS: Mental status change     Physical Exam Updated Vital Signs BP (!) 154/82    Pulse 68    Resp (!) 26    SpO2 100%   Physical Exam Vitals signs and nursing note reviewed.  Constitutional:      Appearance: He is well-developed.     Comments: Drowsy.  Eyes closed.  HENT:     Head: Normocephalic and atraumatic.     Comments: No obvious head trauma. Eyes:     Pupils: Pupils are equal, round, and reactive to light.     Comments: Pupils are 4 mm and reactive bilaterally.  Neck:     Musculoskeletal: Normal range of motion and neck supple.  Cardiovascular:     Rate and Rhythm: Normal rate and regular rhythm.  Pulmonary:     Effort: Pulmonary effort is normal. No respiratory distress.     Breath sounds: Normal breath sounds. No stridor. No wheezing, rhonchi or rales.  Chest:     Chest wall: No tenderness.  Abdominal:     General: Bowel sounds are normal.     Palpations: Abdomen is soft.     Tenderness: There is no abdominal tenderness. There is no guarding or rebound.  Musculoskeletal: Normal range of motion.        General: No swelling, tenderness, deformity or signs of injury.     Right lower leg: No edema.     Left lower leg: No edema.  Skin:    General: Skin is warm and dry.     Findings: No erythema or rash.   Neurological:     Comments: Patient is not following commands.  Seem to be moving both the right upper and right lower extremity with some questionable weakness.  He is intermittently contracting the left upper and left lower extremities.  Patient has making nonverbal sounds.  Not following commands.  Psychiatric:        Behavior: Behavior normal.      ED Treatments / Results  Labs (all labs ordered are listed, but only abnormal results are displayed) Labs Reviewed  CBC - Abnormal; Notable for the following components:      Result Value   RBC 4.16 (*)    Hemoglobin 11.3 (*)    HCT 35.5 (*)    RDW 17.6 (*)    All other components within normal limits  COMPREHENSIVE METABOLIC PANEL - Abnormal; Notable for the following components:   Glucose, Bld 108 (*)    BUN 36 (*)    Creatinine, Ser 2.12 (*)    Calcium 8.7 (*)    Albumin 3.4 (*)    GFR calc non Af Amer 27 (*)    GFR calc Af Amer 31 (*)    All other components within normal limits  URINALYSIS, ROUTINE W REFLEX MICROSCOPIC - Abnormal; Notable for the following components:   APPearance HAZY (*)    Hgb urine dipstick MODERATE (*)    Protein, ur 100 (*)    Leukocytes,Ua SMALL (*)    RBC / HPF >50 (*)    WBC, UA >50 (*)    Bacteria, UA RARE (*)    All other components within normal limits  CBG MONITORING, ED - Abnormal; Notable for the following components:   Glucose-Capillary 105 (*)    All other components within normal limits  PROTIME-INR  APTT  DIFFERENTIAL    EKG EKG Interpretation  Date/Time:  Thursday  October 03 2018 10:34:14 EDT Ventricular Rate:  77 PR Interval:    QRS Duration: 186 QT Interval:  469 QTC Calculation: 531 R Axis:   -76 Text Interpretation:  Ventricular-paced rhythm No further analysis attempted due to paced rhythm Baseline wander in lead(s) V3 V4 V5 Confirmed by Julianne Rice [phone]) on 10/03/2018 10:43:25 AM   Radiology Ct Angio Head W Or Wo Contrast  Result Date: 10/03/2018 CLINICAL  DATA:  Left-sided weakness. EXAM: CT ANGIOGRAPHY HEAD AND NECK CT PERFUSION BRAIN TECHNIQUE: Multidetector CT imaging of the head and neck was performed using the standard protocol during bolus administration of intravenous contrast. Multiplanar CT image reconstructions and MIPs were obtained to evaluate the vascular anatomy. Carotid stenosis measurements (when applicable) are obtained utilizing NASCET criteria, using the distal internal carotid diameter as the denominator. Multiphase CT imaging of the brain was performed following IV bolus contrast injection. Subsequent parametric perfusion maps were calculated using RAPID software. CONTRAST:  14mL OMNIPAQUE IOHEXOL 350 MG/ML SOLN COMPARISON:  None. FINDINGS: CTA NECK FINDINGS Aortic arch: Standard 3 vessel aortic arch with mild atherosclerosis and widely patent arch vessel origins. Right carotid system: Patent with mild, predominantly calcified plaque about the carotid bifurcation. No evidence of significant stenosis or dissection. Left carotid system: Patent with moderate calcified plaque about the carotid bifurcation. No evidence of significant stenosis or dissection. Vertebral arteries: Patent with the left being moderately dominant. Calcified plaque at the right vertebral artery origin and in the distal left V1 segment without evidence of significant stenosis. Skeleton: Cervical disc degeneration greatest at C3-4 where there is severe disc space narrowing and a broad-based posterior disc osteophyte complex resulting in mild-to-moderate spinal stenosis and moderate right and severe left neural foraminal stenosis. Other neck: No mass or enlarged lymph nodes. Scattered venous gas, likely iatrogenic. Upper chest: Increased number of borderline to mildly enlarged lymph nodes in the mediastinum with the largest measuring 14 mm in short axis in the right paratracheal region. Partially visualized small right and trace left pleural effusions. Partially visualized  pacemaker. Review of the MIP images confirms the above findings CTA HEAD FINDINGS Anterior circulation: The internal carotid arteries are patent from skull base to carotid termini with extensive calcified plaque bilaterally. There is mild right supraclinoid ICA stenosis. There is proximal right M1 occlusion with mild collateralization predominantly in the inferior division. The left MCA and both ACAs are patent without evidence of proximal branch occlusion or flow limiting proximal stenosis. There is mild left A1 narrowing. No aneurysm is identified. Posterior circulation: The intracranial vertebral arteries are patent to the basilar with atherosclerotic plaque resulting in moderate to severe right and mild-to-moderate left V4 stenosis. Patent PICA and SCA origins are identified bilaterally. The basilar artery is widely patent. There is a fetal origin of the right PCA. Both PCAs are patent with atherosclerotic branch vessel irregularity bilaterally but no significant proximal stenosis. No aneurysm is identified. Venous sinuses: As permitted by contrast timing, patent. Anatomic variants: Fetal right PCA. Delayed phase: Not performed in the emergent setting. Review of the MIP images confirms the above findings CT Brain Perfusion Findings: CBF (<30%) Volume: 144mL Perfusion (Tmax>6.0s) volume: 342mL Mismatch Volume: 162mL Infarction Location:Right MCA territory including much of the temporal lobe, insula, and portions of the frontal and parietal lobes IMPRESSION: 1. Right M1 occlusion. 2. Evidence of large right MCA core infarct and surrounding penumbra as above. 3. Intracranial atherosclerosis resulting in mild right ICA stenosis and moderate to severe right and mild-to-moderate left V4 stenoses. 4.  Cervical carotid artery atherosclerosis without significant stenosis. 5. Small pleural effusions. Mild mediastinal lymphadenopathy, nonspecific though may be reactive. 6.  Aortic Atherosclerosis (ICD10-I70.0). These  results were called by telephone at the time of interpretation on 10/03/2018 at 11:49 am to Dr. Julianne Rice , who verbally acknowledged these results. Electronically Signed   By: Logan Bores M.D.   On: 10/03/2018 12:05   Ct Angio Neck W And/or Wo Contrast  Result Date: 10/03/2018 CLINICAL DATA:  Left-sided weakness. EXAM: CT ANGIOGRAPHY HEAD AND NECK CT PERFUSION BRAIN TECHNIQUE: Multidetector CT imaging of the head and neck was performed using the standard protocol during bolus administration of intravenous contrast. Multiplanar CT image reconstructions and MIPs were obtained to evaluate the vascular anatomy. Carotid stenosis measurements (when applicable) are obtained utilizing NASCET criteria, using the distal internal carotid diameter as the denominator. Multiphase CT imaging of the brain was performed following IV bolus contrast injection. Subsequent parametric perfusion maps were calculated using RAPID software. CONTRAST:  68mL OMNIPAQUE IOHEXOL 350 MG/ML SOLN COMPARISON:  None. FINDINGS: CTA NECK FINDINGS Aortic arch: Standard 3 vessel aortic arch with mild atherosclerosis and widely patent arch vessel origins. Right carotid system: Patent with mild, predominantly calcified plaque about the carotid bifurcation. No evidence of significant stenosis or dissection. Left carotid system: Patent with moderate calcified plaque about the carotid bifurcation. No evidence of significant stenosis or dissection. Vertebral arteries: Patent with the left being moderately dominant. Calcified plaque at the right vertebral artery origin and in the distal left V1 segment without evidence of significant stenosis. Skeleton: Cervical disc degeneration greatest at C3-4 where there is severe disc space narrowing and a broad-based posterior disc osteophyte complex resulting in mild-to-moderate spinal stenosis and moderate right and severe left neural foraminal stenosis. Other neck: No mass or enlarged lymph nodes. Scattered  venous gas, likely iatrogenic. Upper chest: Increased number of borderline to mildly enlarged lymph nodes in the mediastinum with the largest measuring 14 mm in short axis in the right paratracheal region. Partially visualized small right and trace left pleural effusions. Partially visualized pacemaker. Review of the MIP images confirms the above findings CTA HEAD FINDINGS Anterior circulation: The internal carotid arteries are patent from skull base to carotid termini with extensive calcified plaque bilaterally. There is mild right supraclinoid ICA stenosis. There is proximal right M1 occlusion with mild collateralization predominantly in the inferior division. The left MCA and both ACAs are patent without evidence of proximal branch occlusion or flow limiting proximal stenosis. There is mild left A1 narrowing. No aneurysm is identified. Posterior circulation: The intracranial vertebral arteries are patent to the basilar with atherosclerotic plaque resulting in moderate to severe right and mild-to-moderate left V4 stenosis. Patent PICA and SCA origins are identified bilaterally. The basilar artery is widely patent. There is a fetal origin of the right PCA. Both PCAs are patent with atherosclerotic branch vessel irregularity bilaterally but no significant proximal stenosis. No aneurysm is identified. Venous sinuses: As permitted by contrast timing, patent. Anatomic variants: Fetal right PCA. Delayed phase: Not performed in the emergent setting. Review of the MIP images confirms the above findings CT Brain Perfusion Findings: CBF (<30%) Volume: 169mL Perfusion (Tmax>6.0s) volume: 342mL Mismatch Volume: 140mL Infarction Location:Right MCA territory including much of the temporal lobe, insula, and portions of the frontal and parietal lobes IMPRESSION: 1. Right M1 occlusion. 2. Evidence of large right MCA core infarct and surrounding penumbra as above. 3. Intracranial atherosclerosis resulting in mild right ICA stenosis  and moderate to severe right  and mild-to-moderate left V4 stenoses. 4. Cervical carotid artery atherosclerosis without significant stenosis. 5. Small pleural effusions. Mild mediastinal lymphadenopathy, nonspecific though may be reactive. 6.  Aortic Atherosclerosis (ICD10-I70.0). These results were called by telephone at the time of interpretation on 10/03/2018 at 11:49 am to Dr. Julianne Rice , who verbally acknowledged these results. Electronically Signed   By: Logan Bores M.D.   On: 10/03/2018 12:05   Ct Cerebral Perfusion W Contrast  Result Date: 10/03/2018 CLINICAL DATA:  Left-sided weakness. EXAM: CT ANGIOGRAPHY HEAD AND NECK CT PERFUSION BRAIN TECHNIQUE: Multidetector CT imaging of the head and neck was performed using the standard protocol during bolus administration of intravenous contrast. Multiplanar CT image reconstructions and MIPs were obtained to evaluate the vascular anatomy. Carotid stenosis measurements (when applicable) are obtained utilizing NASCET criteria, using the distal internal carotid diameter as the denominator. Multiphase CT imaging of the brain was performed following IV bolus contrast injection. Subsequent parametric perfusion maps were calculated using RAPID software. CONTRAST:  15mL OMNIPAQUE IOHEXOL 350 MG/ML SOLN COMPARISON:  None. FINDINGS: CTA NECK FINDINGS Aortic arch: Standard 3 vessel aortic arch with mild atherosclerosis and widely patent arch vessel origins. Right carotid system: Patent with mild, predominantly calcified plaque about the carotid bifurcation. No evidence of significant stenosis or dissection. Left carotid system: Patent with moderate calcified plaque about the carotid bifurcation. No evidence of significant stenosis or dissection. Vertebral arteries: Patent with the left being moderately dominant. Calcified plaque at the right vertebral artery origin and in the distal left V1 segment without evidence of significant stenosis. Skeleton: Cervical disc  degeneration greatest at C3-4 where there is severe disc space narrowing and a broad-based posterior disc osteophyte complex resulting in mild-to-moderate spinal stenosis and moderate right and severe left neural foraminal stenosis. Other neck: No mass or enlarged lymph nodes. Scattered venous gas, likely iatrogenic. Upper chest: Increased number of borderline to mildly enlarged lymph nodes in the mediastinum with the largest measuring 14 mm in short axis in the right paratracheal region. Partially visualized small right and trace left pleural effusions. Partially visualized pacemaker. Review of the MIP images confirms the above findings CTA HEAD FINDINGS Anterior circulation: The internal carotid arteries are patent from skull base to carotid termini with extensive calcified plaque bilaterally. There is mild right supraclinoid ICA stenosis. There is proximal right M1 occlusion with mild collateralization predominantly in the inferior division. The left MCA and both ACAs are patent without evidence of proximal branch occlusion or flow limiting proximal stenosis. There is mild left A1 narrowing. No aneurysm is identified. Posterior circulation: The intracranial vertebral arteries are patent to the basilar with atherosclerotic plaque resulting in moderate to severe right and mild-to-moderate left V4 stenosis. Patent PICA and SCA origins are identified bilaterally. The basilar artery is widely patent. There is a fetal origin of the right PCA. Both PCAs are patent with atherosclerotic branch vessel irregularity bilaterally but no significant proximal stenosis. No aneurysm is identified. Venous sinuses: As permitted by contrast timing, patent. Anatomic variants: Fetal right PCA. Delayed phase: Not performed in the emergent setting. Review of the MIP images confirms the above findings CT Brain Perfusion Findings: CBF (<30%) Volume: 126mL Perfusion (Tmax>6.0s) volume: 347mL Mismatch Volume: 13mL Infarction Location:Right  MCA territory including much of the temporal lobe, insula, and portions of the frontal and parietal lobes IMPRESSION: 1. Right M1 occlusion. 2. Evidence of large right MCA core infarct and surrounding penumbra as above. 3. Intracranial atherosclerosis resulting in mild right ICA stenosis and moderate  to severe right and mild-to-moderate left V4 stenoses. 4. Cervical carotid artery atherosclerosis without significant stenosis. 5. Small pleural effusions. Mild mediastinal lymphadenopathy, nonspecific though may be reactive. 6.  Aortic Atherosclerosis (ICD10-I70.0). These results were called by telephone at the time of interpretation on 10/03/2018 at 11:49 am to Dr. Julianne Rice , who verbally acknowledged these results. Electronically Signed   By: Logan Bores M.D.   On: 10/03/2018 12:05   Ct Head Code Stroke Wo Contrast`  Result Date: 10/03/2018 CLINICAL DATA:  Code stroke.  Left-sided weakness EXAM: CT HEAD WITHOUT CONTRAST TECHNIQUE: Contiguous axial images were obtained from the base of the skull through the vertex without intravenous contrast. COMPARISON:  CT head 05/24/2018 FINDINGS: Brain: Atrophy and chronic white matter changes are stable from the prior study. Negative for hydrocephalus Negative for acute infarct, hemorrhage, or mass lesion. Vascular: Arterial calcification. Questionable hyperdense right MCA. This is a focal area of increased density which could be an artifact due to motion. However given the patient's left-sided weakness, CTA recommended. Skull: Negative Sinuses/Orbits: Paranasal sinuses clear bilaterally. Bilateral cataract surgery Other: None ASPECTS ([place] Stroke Program Early CT Score) - Ganglionic level infarction (caudate, lentiform nuclei, internal capsule, insula, M1-M3 cortex): 7 - Supraganglionic infarction (M4-M6 cortex): 3 Total score (0-10 with 10 being normal): 10 IMPRESSION: 1. Possible short segment hyperdense right MCA. This could be due to thrombus however there is  artifact in the area. CTA recommended given the patient's left-sided weakness. 2. ASPECTS is 10 3. These results were called by telephone at the time of interpretation on 10/03/2018 at 10:37 am to Dr. Julianne Rice , who verbally acknowledged these results. Electronically Signed   By: Franchot Gallo M.D.   On: 10/03/2018 10:37    Procedures Procedures (including critical care time)  Medications Ordered in ED Medications  sodium chloride 0.9 % bolus 250 mL (0 mLs Intravenous Stopped 10/03/18 1206)  iohexol (OMNIPAQUE) 350 MG/ML injection 90 mL (90 mLs Intravenous Contrast Given 10/03/18 1137)   CRITICAL CARE Performed by: Julianne Rice Total critical care time: 25 minutes Critical care time was exclusive of separately billable procedures and treating other patients. Critical care was necessary to treat or prevent imminent or life-threatening deterioration. Critical care was time spent personally by me on the following activities: development of treatment plan with patient and/or surrogate as well as nursing, discussions with consultants, evaluation of patient's response to treatment, examination of patient, obtaining history from patient or surrogate, ordering and performing treatments and interventions, ordering and review of laboratory studies, ordering and review of radiographic studies, pulse oximetry and re-evaluation of patient's condition.  Initial Impression / Assessment and Plan / ED Course  I have reviewed the triage vital signs and the nursing notes.  Pertinent labs & imaging results that were available during my care of the patient were reviewed by me and considered in my medical decision making (see chart for details).        Tele-neurology at bedside examining patient. Spoke with Dr. Malen Gauze.  Reviewed patient's imaging studies.  Recommends transfer to [facility] emergency department and will assess patient at that point for disposition.  Dr. Ayesha Rumpf accept patient in transfer.   Patient remains clinically stable.  Protecting airway. Final Clinical Impressions(s) / ED Diagnoses   Final diagnoses:  Acute ischemic stroke [facility])    ED Discharge Orders    None       Julianne Rice, MD 10/03/18 1241

## 2018-10-03 NOTE — Care Management (Signed)
ED CM received call from  [facility] with [facility] concerning referral for home hospice start of care today at 7:30pm. Patient has DME at home. Updated family on transitional care plan they are agreeable. CM explained that a nurse will come out tentative time is 7:30pm to do the initial assessment. Denies any further questions or concerns at this time.  Patient will be transported home by [facility] Updated Dr. Dayna Barker.

## 2018-10-03 NOTE — ED Notes (Signed)
Pt transported home with [facility].

## 2018-10-03 NOTE — ED Notes (Signed)
Not able to perform Stroke Swallow Screen due to unresponsiveness.

## 2018-10-03 NOTE — ED Notes (Signed)
EMS here to take pt to [facility] ER

## 2018-10-03 NOTE — Care Management (Addendum)
ED CM received consult from Dr. Dayna Barker for [place]. Family is wanting to take patient home with hospice comfort care. ED CM met with family at bedside to discuss goals of care family states they would like for him to be at home with family and kept comfortable with hospice support. [facility] ([facility]) liaison Anderson Malta contacted, and will follow up with family. Updated ED nursing  staff. ED CM will continue to follow for transitional care plan  Wendi Maya RN BSN NCM  [phone]

## 2018-10-03 NOTE — ED Notes (Signed)
TPA is not able to be performed for patient due to Eliquis. Therefore, pt's VSS and neuro checks are supposed to be q2.

## 2018-10-03 NOTE — ED Notes (Signed)
Took pt over to get a CT Angio and radiology stated to bring patient back bc they had to check with the radiologist to see if contrast could be given due to low GFR of 26. Notified Dr. Lita Mains as well as Tele Neuro

## 2018-10-03 NOTE — Progress Notes (Signed)
New referral received for home hospice.  Spoke with patient's daughter to inform her that we will be able to open her to services tonight at 18.  Referral faxed to referral intake office.  DME in the home includes hospital bed and wheelchair.  Admitting hospice RN will assess for any other DME needs and arrange delivery.  Spoke with Lucia Bitter Case Manager in ED to notify her of same.  She will set up transport/d/c from ED asap.  Thank you for allowing participation in this patient's care.  Dimas Aguas, RN Clinical Nurse Liaison [facility] [phone]

## 2018-10-03 NOTE — ED Notes (Addendum)
Per EMS family reported he was rolling around in w/c, then seen with head slumped and stopped moving . On EMS arrival slumped in W/c and no movement in right arm , but lifting up his right knee.Marland KitchenUpon assessment pt has no control over left arm. No effort against gravity

## 2018-10-03 NOTE — ED Provider Notes (Signed)
4:15 PM Assumed care from Dr. Ralene Bathe, please see their note for full history, physical and decision making until this point. In brief this is a 85 y.o. year old male who presented to the ED tonight with Code Stroke     Patient with severe dementia without ability to care for himself the last 9 months presented to independent for an acute ischemic stroke.  Transferred here for possible intervention but not a candidate.  After discussing with neurologist, Dr. Malen Gauze, the family has elected to change over to comfort care and will prefer to take the patient home.  They have around-the-clock care with family already but would need a hospice evaluation.  I discussed with case management who will try to get hospice involved.  Hopefully discharge on comfort care home this evening.  Discharge instructions, including strict return precautions for new or worsening symptoms, given. Patient and/or family verbalized understanding and agreement with the plan as described.   Labs, studies and imaging reviewed by myself and considered in medical decision making if ordered. Imaging interpreted by radiology.  Labs Reviewed  CBC - Abnormal; Notable for the following components:      Result Value   RBC 4.16 (*)    Hemoglobin 11.3 (*)    HCT 35.5 (*)    RDW 17.6 (*)    All other components within normal limits  COMPREHENSIVE METABOLIC PANEL - Abnormal; Notable for the following components:   Glucose, Bld 108 (*)    BUN 36 (*)    Creatinine, Ser 2.12 (*)    Calcium 8.7 (*)    Albumin 3.4 (*)    GFR calc non Af Amer 27 (*)    GFR calc Af Amer 31 (*)    All other components within normal limits  URINALYSIS, ROUTINE W REFLEX MICROSCOPIC - Abnormal; Notable for the following components:   APPearance HAZY (*)    Hgb urine dipstick MODERATE (*)    Protein, ur 100 (*)    Leukocytes,Ua SMALL (*)    RBC / HPF >50 (*)    WBC, UA >50 (*)    Bacteria, UA RARE (*)    All other components within normal limits  CBG  MONITORING, ED - Abnormal; Notable for the following components:   Glucose-Capillary 105 (*)    All other components within normal limits  PROTIME-INR  APTT  DIFFERENTIAL    CT CEREBRAL PERFUSION W CONTRAST  Final Result    CT ANGIO HEAD W OR WO CONTRAST  Final Result    CT Angio Neck W and/or Wo Contrast  Final Result    CT HEAD CODE STROKE WO CONTRAST`  Final Result      No follow-ups on file.    Kaliope Quinonez, Corene Cornea, MD 10/07/18 [id number]

## 2018-10-03 NOTE — ED Triage Notes (Signed)
Pt brought in by EMS as code stroke. Per EMS. Pt has dementia and was scooting

## 2018-10-03 NOTE — ED Triage Notes (Signed)
Pt LKW was 0930. Family noticed that he was not responding appropriately. Right side weakness. Unresponsive. VSS. Dementia at baseline.

## 2018-10-03 NOTE — Consult Note (Addendum)
Neurology Consultation  Reason for Consult: Stroke Referring Physician: Dr. Marin Roberts  CC: Left-sided weakness  History is obtained from: Patient, chart, patient's daughter over the phone  HPI: Neil [name] is a 85 y.o. male past medical history of dementia, who is wheelchair-bound at baseline but is able to push his wheelchair and have some conversation, CHF, CKD, dyslipidemia, pacemaker, diabetes, atrial fibrillation on Eliquis presented to the emergency room at [facility] as a code stroke for left-sided flaccidity with last known normal at 9 or 9:30 AM today.  Evaluated by telemedicine neurology over at [facility] and not deemed to be a candidate for IV TPA due to the anticoagulation.  CT angiogram neck and head performed that showed a right M1 cutoff and a CT perfusion study showed a large score of 140 cc in the right MCA territory. Patient was transferred to [facility] for higher level of care and inpatient neurological consultation. I spoke with the family members-the daughter on the phone and his wife was beside the daughter. The family members informed me that he has dementia at baseline and has expressed prior to that very clearly that he does not want to be kept alive with life support if there is no chance of return of good quality of life.  The family members were told that he might be a candidate for intervention but his baseline modified Rankin score is 4-5 and he has a large core on CT perfusion imaging, which makes him ineligible for intervention.  I discussed this in detail with the family. Family also informed me that he is a DNR.   LKW: 9:30 AM on 10/03/2018 tpa given?: no, decision made by telemedicine neurology-please see their consult note Premorbid modified Rankin scale (mRS): 4-5 (over past many few months, mainly wheelchair bound and on a hospital bed at home with 24x7 care by family - information family provided on arrival) ROS:  Unable  to obtain due to altered mental status and severe dysarthria.   Past Medical History:  Diagnosis Date  . Chronic kidney disease (CKD), stage III (moderate) (HCC)   . Chronic systolic CHF (congestive heart failure) ([place])   . Complete heart block ([place])   . Coronary artery disease    status post [id number]  . Dementia ([place])   . Dyslipidemia   . Foley catheter in place   . Hypertension   . Ischemic cardiomyopathy    per last echo 11-10-2017  ef 15-20%  . Osteoarthritis   . Permanent atrial fibrillation   . Presence of permanent cardiac pacemaker cardiologist-  dr Samuel Bouche Jude status post generator replacement 2009 (original placement 02/ 1998)  . Renal artery stenosis (HCC)    95%  left, 50% right  . Requires assistance with activities of daily living (ADL)   . 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 Type 2 diabetes mellitus ([place])   . Urinary retention   . Uses wheelchair     Family History  Problem Relation Age of Onset  . Other Mother   . Cancer Mother        breast  . Alzheimer's disease Sister    Social History:   reports that he has never smoked. He has never used smokeless tobacco. He reports that he does not drink alcohol or use drugs.  Medications No current facility-administered medications for this encounter.   Current Outpatient Medications:  .  albuterol (PROVENTIL) (2.5 MG/3ML) 0.083% nebulizer solution, Take 3 mLs (  2.5 mg total) by nebulization every 6 (six) hours as needed for wheezing or shortness of breath., Disp: 75 mL, Rfl: 12 .  apixaban (ELIQUIS) 2.5 MG TABS tablet, Take 1 tablet (2.5 mg total) by mouth 2 (two) times daily for 30 days., Disp: 60 tablet, Rfl: 0 .  atorvastatin (LIPITOR) 40 MG tablet, Take 40 mg by mouth daily at 12 noon. , Disp: , Rfl:  .  doxycycline (VIBRAMYCIN) 100 MG capsule, Take 1 capsule (100 mg total) by mouth daily for 5 days., Disp: 5 capsule, Rfl: 0 .  feeding supplement, ENSURE ENLIVE, (ENSURE ENLIVE) LIQD, Take 237 mLs by mouth 2 (two)  times daily between meals. (Patient taking differently: Take 237 mLs by mouth daily after breakfast. ), Disp: 237 mL, Rfl: 12 .  FEROSUL 325 (65 Fe) MG tablet, Take 325 mg by mouth daily. , Disp: , Rfl: 3 .  furosemide (LASIX) 40 MG tablet, Take 1 tablet (40 mg total) by mouth daily., Disp: 7 tablet, Rfl: 0 .  glipiZIDE (GLUCOTROL XL) 2.5 MG 24 hr tablet, Take 2.5-5 mg by mouth as directed. Depends on blood sugar. Checks twice daily., Disp: , Rfl: 11 .  guaiFENesin (MUCINEX) 600 MG 12 hr tablet, Take 600 mg by mouth 2 (two) times daily., Disp: , Rfl:  .  lactobacillus acidophilus & bulgar (LACTINEX) chewable tablet, Chew 2 tablets by mouth at bedtime., Disp: 14 tablet, Rfl: 0 .  levothyroxine (SYNTHROID, LEVOTHROID) 25 MCG tablet, Take 25 mcg by mouth daily at 12 noon. , Disp: , Rfl: 1 .  Maltodextrin-Xanthan Gum (RESOURCE THICKENUP CLEAR) POWD, Thicken fluids to nectar thick, Disp: 125 g, Rfl: 1 .  mirabegron ER (MYRBETRIQ) 25 MG TB24 tablet, Take 1 tablet (25 mg total) by mouth daily for 30 days., Disp: 30 tablet, Rfl: 0 .  pantoprazole (PROTONIX) 40 MG tablet, Take 40 mg by mouth daily at 12 noon. , Disp: , Rfl:  .  polyethylene glycol (MIRALAX) packet, Take 17 g by mouth daily as needed for moderate constipation., Disp: , Rfl:  .  QUEtiapine (SEROQUEL) 50 MG tablet, Take 50 mg by mouth at bedtime., Disp: , Rfl:  .  rOPINIRole (REQUIP) 0.5 MG tablet, Take 1 mg by mouth at bedtime. , Disp: , Rfl:  .  sulfamethoxazole-trimethoprim (BACTRIM,SEPTRA) 400-80 MG tablet, Take 1 tablet by mouth at bedtime., Disp: , Rfl:   Exam: Current vital signs: BP (!) 154/79   Pulse 70   Resp (!) 21   SpO2 100%  Vital signs in last 24 hours: Pulse Rate:  [68-78] 70 (03/26 1245) Resp:  [12-27] 21 (03/26 1245) BP: (141-159)/(77-84) 154/79 (03/26 1230) SpO2:  [94 %-100 %] 100 % (03/26 1245) General: Patient is drowsy, opens eyes to voice and follows commands on the right. HEENT: Normocephalic atraumatic Lungs:  Clear to auscultation CVS: Regular rhythm Abdomen: Soft nondistended nontender Extremities warm well perfused with intact pulses Neurological exam Drowsy, opens eyes to voice. He follows commands on the right-able to make a fist on the right hand and raise his right hand up.  Also able to lift his right leg up to command. Speech is extremely dysarthric and very difficult to comprehend. Cranial nerves: Pupils are equal round reactive to light, he has a rightward gaze preference and inability to cross the midline to look at the left.  Does not blink to threat from the left.  Blinks to threat on the right.  Right lower facial weakness.  Decreased auditory acuity both ears Motor exam:  Flaccid left upper extremity with no movement to noxious stimulation.  Left lower extremity is at least 2/5 with normal tone.  Right upper and lower extremities are both antigravity. Sensory exam: Severely diminished sensation to the left side-light touch and noxious stim.  There is also sensory neglect on the left side. Coordination: Difficult to assess due to his reduced hearing but no ataxia noted on right upper and right lower extremity.  He is unable to perform heel-knee-shin on the left and unable to perform finger-nose-finger on the left. NIH stroke scale 1a Level of Conscious.: 1 1b LOC Questions: 2 1c LOC Commands: 0 2 Best Gaze: 1 3 Visual: 2 4 Facial Palsy: 1 5a Motor Arm - left: 4 5b Motor Arm - Right: 0 6a Motor Leg - Left: 2 6b Motor Leg - Right: 0 7 Limb Ataxia: 0 8 Sensory: 2 9 Best Language: 0 10 Dysarthria: 2 11 Extinct. and Inatten.: 2 TOTAL: 19  Labs I have reviewed labs in epic and the results pertinent to this consultation are:  CBC    Component Value Date/Time   WBC 8.1 10/03/2018 1039   RBC 4.16 (L) 10/03/2018 1039   HGB 11.3 (L) 10/03/2018 1039   HCT 35.5 (L) 10/03/2018 1039   PLT 200 10/03/2018 1039   MCV 85.3 10/03/2018 1039   MCH 27.2 10/03/2018 1039   MCHC 31.8  10/03/2018 1039   RDW 17.6 (H) 10/03/2018 1039   LYMPHSABS 1.4 10/03/2018 1039   MONOABS 0.5 10/03/2018 1039   EOSABS 0.2 10/03/2018 1039   BASOSABS 0.0 10/03/2018 1039   CMP     Component Value Date/Time   NA 136 10/03/2018 1039   K 3.6 10/03/2018 1039   CL 99 10/03/2018 1039   CO2 26 10/03/2018 1039   GLUCOSE 108 (H) 10/03/2018 1039   BUN 36 (H) 10/03/2018 1039   CREATININE 2.12 (H) 10/03/2018 1039   CALCIUM 8.7 (L) 10/03/2018 1039   PROT 7.0 10/03/2018 1039   ALBUMIN 3.4 (L) 10/03/2018 1039   AST 18 10/03/2018 1039   ALT 18 10/03/2018 1039   ALKPHOS 75 10/03/2018 1039   BILITOT 1.1 10/03/2018 1039   GFRNONAA 27 (L) 10/03/2018 1039   GFRAA 31 (L) 10/03/2018 1039     Imaging I have reviewed the images obtained:  CT-scan of the brain demonstrates hyperdense right M1 and also possibly early changes in the posterior limb of internal capsule, insula and temporal lobe.  I would estimate the aspect score to be 6-7. CT perfusion and CT Angio head and neck shows a right M1 cutoff with a large core of 140 cc.  Penumbra 326.  Mismatch 186.  Assessment: 85 year old man past medical history of dementia, CHF, CKD, dyslipidemia, A. fib on Eliquis, status post pacemaker, diabetes presenting for sudden onset of left-sided weakness. Seen by telemedicine neurology, not a candidate for TPA due to being on Eliquis last dose taken within 48 hours. Sent over to [facility] for higher level of care and possible consideration for thrombectomy. He has a poor baseline MRIs of 4 or 5. His noncontrast CT of the head, although read as aspects 10, is more realistically and aspects of 6 or 7 with temporal lobe, insula and posterior limb of internal capsule on the right already showing changes of stroke. CT perfusion study is more stark with a large score of 140 cc making him ineligible for thrombectomy. Given his age, poor baseline-functional as well as with dementia, and the large size of the stroke,  he is not a candidate for any intervention. I called his daughter, and wife and had a detailed discussion about the prognosis and future quality of life if he were to survive this large stroke. My suspicion is that his stroke will continue to progress and he will develop cerebral edema and the only treatment option for that is hemicraniectomy, and given his age and other comorbidities, he is not a candidate for hemicraniectomy either. This stroke will render him to be at a very poor neurological functional status, which is something that he has expressed clearly to his family that he would not like to be. He is also expressed wishes to not be on life support unless there is a chance of neurologically meaningful recovery. Putting all this together, I would recommend that it family discussion be had once family gets here and comfort measures should be pursued. We are awaiting family arrival.  Impression: Large right MCA stroke- right M1 occlusion-likely cardioembolic Baseline history of dementia Diabetes Dyslipidemia   Recommendations: I would ideally like to get an MRI to look at the stroke size but because of his old pacemaker, that would not be possible. Head CT can be repeated in about 5 to 6 hours to look at the stroke progression although I do not think that we will change the course in any way. I had a detailed discussion with the family that this stroke would leave him with significant deficits if he were to survive the stroke.  He has expressed clearly that he does not want to be on prolonged life support or be on life prolonging measures if there is no chance for neurologically meaningful recovery, which given the size of his stroke and his comorbidities and poor baseline looks to be the way where he is headed to. His family is requested to come to the hospital to see him.  The ER has made special exception, with the current COVID-19 restrictions in place, to let the family see him and  make the decision for goals of care and possible comfort measures going forward. I would be available to speak with the family in person. Please call me when the family arrives and I will addend my note with details of the discussions that have the family.  -- Amie Portland, MD [facility] Pager: [phone] If 7pm to 7am, please call on call as listed on AMION.   Addendum 1600 hrs Spoke with the family in detail They confirm that the patient was asked to start Eliquis but they never did and kept him on Pradaxa. The family was given all the information from the CT scan as well as clinical exam. They have decided to pursue comfort measures only and not requesting home hospice. I have relayed my plan to the ED provider Dr. Dayna Barker in person. Neurology will be available as needed.

## 2018-10-03 NOTE — Consult Note (Addendum)
Addendum: case reviewed with NIR, patient has a large core infarct, due to being wheelchair bout and having dementia he is not deemed to be a candidate for mechanical thrombectomy but he will be transferred to another facility for re-evaluation on the ground, transfer center will notify  [facility].  ////////////////   [facility] Tele[facility]urology Consult Services   Date of Service:   10/03/2018 10:29:51  Impression:     .  Large Vessel Infarct     .  Right Hemispheric Infarct     .  MCA Distribution Infarct  Comments/Sign-Out: Patient with history of dementia, recent Flu, Atrial Fibrillation is on pradaxa and has pacemaker. presents with acute left sided weakness, head CT shows possible right dense MCA sign. NIHSS 30 presentation consistent with Acute Ischemic Stroke. since on pradaxa therefore not a candidate for IV tPA. will do STAT head and neck CTA to investigate for Large Vessel Occlusion and Based on the results of CTA head and neck and CTP brain, will determine presence of Large Vessel Occlusion and eligibility for mechanical thrombectomy.  Mechanism of Stroke: Possible Cardioembolic  Metrics: Last Known Well: 10/03/2018 09:30:00 [facility] Notification Time: 10/03/2018 10:29:51 Arrival Time: 10/03/2018 10:16:00 Stamp Time: 10/03/2018 10:29:51 Time First Login Attempt: 10/03/2018 10:36:08 Video Start Time: 10/03/2018 10:36:08  Symptoms: left sided weakness NIHSS Start Assessment Time: 10/03/2018 10:40:00 Patient is not a candidate for tPA. Patient was not deemed candidate for tPA thrombolytics because of Current use of NOA's. Video End Time: 10/03/2018 11:09:10  CT head was reviewed and results were: likely right dense MCA sign  Clinical Presentation is Suggestive of Large Vessel Occlusive Disease, Recommendations are as Follows  CTA Head and Neck. CT Perfusion.   ED Physician notified of diagnostic impression and management plan on  10/03/2018 10:50:28  Our recommendations are outlined below.  Recommendations:     .  Activate Stroke Protocol Admission/Order Set     .  Stroke/Telemetry Floor     .  Neuro Checks     .  Bedside Swallow Eval     .  DVT Prophylaxis     .  IV Fluids, Normal Saline     .  Head of Bed Below 30 Degrees     .  Euglycemia and Avoid Hyperthermia (PRN Acetaminophen)  (General approach to antithrombotic therapy for acute ischemic stroke (suspected or proven) in patient on long-term anticoagulation (appropriately) prior to stroke onset) Will hold anticoagulation and will start aspirin for now ( if INR is not supratherapeutic if patient is on Warfarin and no other contraindication) till follow up head imaging result is available within 24 hours, if follow up head imaging (preferably a brain MRI) shows no acute stroke then stop aspirin and resume Anticoagulation, if imaging shows small ischemic stroke without any hemorrhage then can stop aspirin and resume anticoagulation in 24-48 hours after stroke symptom onset if patient is stable, if imaging shows large stroke (but no hemorrhagic conversion) then will continue aspirin and keep holding anticoagulation (Warfarin for at least 10 days and NOAC agents for 14 days (from onset of stroke)). prior to initiating anticoagulation, a noncontrast head CT should be obtained to evaluate for hemorrhagic conversion. once anticoagulation started then aspirin should be stopped.   Routine Consultation with [facility] for Follow up Care  Sign Out:     .  Discussed with Emergency Department Provider    ------------------------------------------------------------------------------  History of Present Illness: Patient is a 85 year old Male.  Patient was  brought by EMS for symptoms of left sided weakness  Patient with history of dementia, recent Flu, Atrial Fibrillation is on pradaxa and has pacemaker. last known well per family: 9:30 had witnessed slumped  over, EMS was reporting no right side movements.  CT head was reviewed.  There is history of Recent Anticoagulants.  Examination: 1A: Level of Consciousness - Movements to Pain + 2 1B: Ask Month and Age - Could Not Answer Either Question Correctly + 2 1C: Blink Eyes & Squeeze Hands - Performs 0 Tasks + 2 2: Test Horizontal Extraocular Movements - Normal + 0 3: Test Visual Fields - Bilateral Hemianopia + 3 4: Test Facial Palsy (Use Grimace if Obtunded) - Bilateral Complete paralysis (upper/lower face) + 3 5A: Test Left Arm Motor Drift - No Movement + 4 5B: Test Right Arm Motor Drift - Drift, hits bed + 2 6A: Test Left Leg Motor Drift - No Movement + 4 6B: Test Right Leg Motor Drift - No Effort Against Gravity + 3 7: Test Limb Ataxia (FNF/Heel-Shin) - No Ataxia + 0 8: Test Sensation - Normal; No sensory loss + 0 9: Test Language/Aphasia - Mute/Global Aphasia: No Usable Speech/Auditory Comprehension + 3 10: Test Dysarthria - Mute/Anarthric + 2 11: Test Extinction/Inattention - No abnormality + 0  NIHSS Score: 30  Patient was informed the Neurology Consult would happen via TeleHealth consult by way of interactive audio and video telecommunications and consented to receiving care in this manner.  Due to the immediate potential for life-threatening deterioration due to underlying acute neurologic illness, I spent 35 minutes providing critical care. This time includes time for face to face visit via telemedicine, review of medical records, imaging studies and discussion of findings with providers, the patient and/or family.   Dr Elenor Quinones   [facility] [phone]   Case [id number]

## 2018-10-03 NOTE — Progress Notes (Signed)
CODE STROKE CT TIMES 1010 CALL TIME 1021 EXAM STARTED 1022 EXAM FINISHED 1026 EXAM COMPLETE IN EPIC 1026 [facility]

## 2018-10-03 NOTE — ED Notes (Signed)
Pt arrives via [facility] from [facility] as a code stroke. Flaccid on L side, no purposeful movements, not following commands.

## 2018-10-04 DIAGNOSIS — M6281 Muscle weakness (generalized): Secondary | ICD-10-CM | POA: Diagnosis not present

## 2018-10-04 DIAGNOSIS — I509 Heart failure, unspecified: Secondary | ICD-10-CM | POA: Diagnosis not present

## 2018-10-04 DIAGNOSIS — I69398 Other sequelae of cerebral infarction: Secondary | ICD-10-CM | POA: Diagnosis not present

## 2018-10-04 DIAGNOSIS — I69318 Other symptoms and signs involving cognitive functions following cerebral infarction: Secondary | ICD-10-CM | POA: Diagnosis not present

## 2018-10-04 DIAGNOSIS — I130 Hypertensive heart and chronic kidney disease with heart failure and stage 1 through stage 4 chronic kidney disease, or unspecified chronic kidney disease: Secondary | ICD-10-CM | POA: Diagnosis not present

## 2018-10-04 DIAGNOSIS — I69322 Dysarthria following cerebral infarction: Secondary | ICD-10-CM | POA: Diagnosis not present

## 2018-10-08 ENCOUNTER — Ambulatory Visit: Payer: Medicare Other

## 2018-10-09 DIAGNOSIS — R339 Retention of urine, unspecified: Secondary | ICD-10-CM | POA: Diagnosis not present

## 2018-10-09 DIAGNOSIS — I255 Ischemic cardiomyopathy: Secondary | ICD-10-CM | POA: Diagnosis not present

## 2018-10-09 DIAGNOSIS — I509 Heart failure, unspecified: Secondary | ICD-10-CM | POA: Diagnosis not present

## 2018-10-09 DIAGNOSIS — I69322 Dysarthria following cerebral infarction: Secondary | ICD-10-CM | POA: Diagnosis not present

## 2018-10-09 DIAGNOSIS — M1990 Unspecified osteoarthritis, unspecified site: Secondary | ICD-10-CM | POA: Diagnosis not present

## 2018-10-09 DIAGNOSIS — E1122 Type 2 diabetes mellitus with diabetic chronic kidney disease: Secondary | ICD-10-CM | POA: Diagnosis not present

## 2018-10-09 DIAGNOSIS — Z466 Encounter for fitting and adjustment of urinary device: Secondary | ICD-10-CM | POA: Diagnosis not present

## 2018-10-09 DIAGNOSIS — Z950 Presence of cardiac pacemaker: Secondary | ICD-10-CM | POA: Diagnosis not present

## 2018-10-09 DIAGNOSIS — M6281 Muscle weakness (generalized): Secondary | ICD-10-CM | POA: Diagnosis not present

## 2018-10-09 DIAGNOSIS — I130 Hypertensive heart and chronic kidney disease with heart failure and stage 1 through stage 4 chronic kidney disease, or unspecified chronic kidney disease: Secondary | ICD-10-CM | POA: Diagnosis not present

## 2018-10-09 DIAGNOSIS — I2510 Atherosclerotic heart disease of native coronary artery without angina pectoris: Secondary | ICD-10-CM | POA: Diagnosis not present

## 2018-10-09 DIAGNOSIS — N183 Chronic kidney disease, stage 3 (moderate): Secondary | ICD-10-CM | POA: Diagnosis not present

## 2018-10-09 DIAGNOSIS — I69398 Other sequelae of cerebral infarction: Secondary | ICD-10-CM | POA: Diagnosis not present

## 2018-10-09 DIAGNOSIS — E785 Hyperlipidemia, unspecified: Secondary | ICD-10-CM | POA: Diagnosis not present

## 2018-10-09 DIAGNOSIS — I4821 Permanent atrial fibrillation: Secondary | ICD-10-CM | POA: Diagnosis not present

## 2018-10-09 DIAGNOSIS — I69318 Other symptoms and signs involving cognitive functions following cerebral infarction: Secondary | ICD-10-CM | POA: Diagnosis not present

## 2018-11-08 DEATH — deceased

## 2019-08-29 IMAGING — CT CT CERVICAL SPINE W/O CM
4 of 6 series · 15 of 33 positions shown, 17 images · non-contrast
Comparison: None.

CLINICAL DATA: Worsening dementia, decreased appetite, weight loss

EXAM:
CT HEAD WITHOUT CONTRAST
CT CERVICAL SPINE WITHOUT CONTRAST
TECHNIQUE: Multidetector CT imaging of the head and cervical spine was
performed following the standard protocol without intravenous
contrast. Multiplanar CT image reconstructions of the cervical spine
were also generated.

[Series 3: head wo · axial · 0.47mm/px · z∈[-1,+49]mm · 2 of 31 slices shown]
[im 11/31  bone]
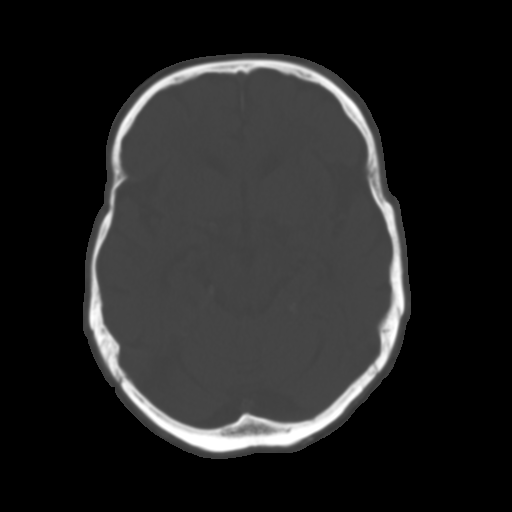
[im 21/31  bone]
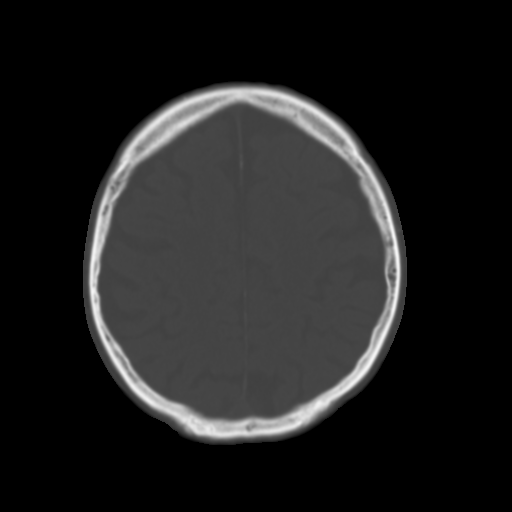

[Series 8: c spine soft · axial · 0.33mm/px · z∈[-166,-42]mm · 8 of 80 slices shown, 10 images]
[im 9/80  soft-tissue]
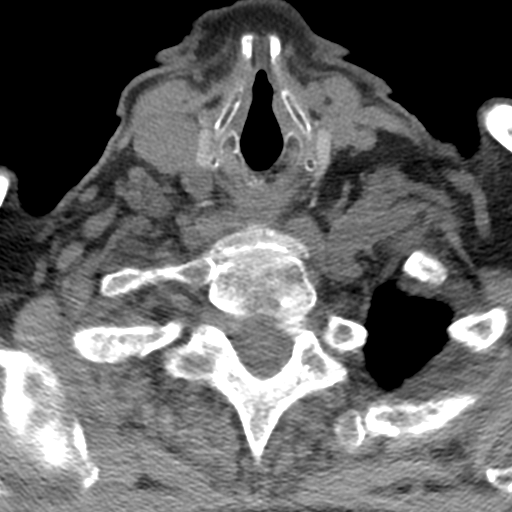
[im 9/80  bone]
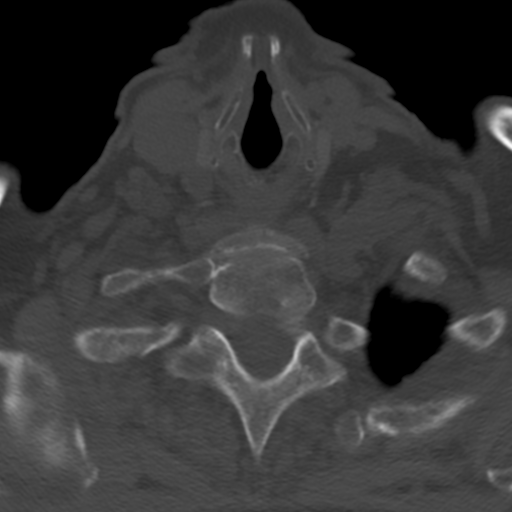
[im 18/80  bone]
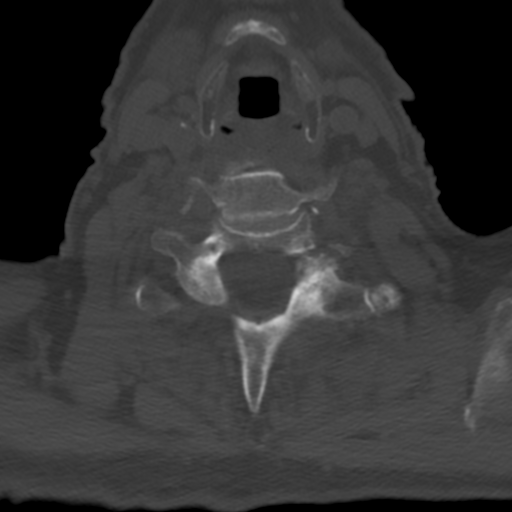
[im 27/80  bone]
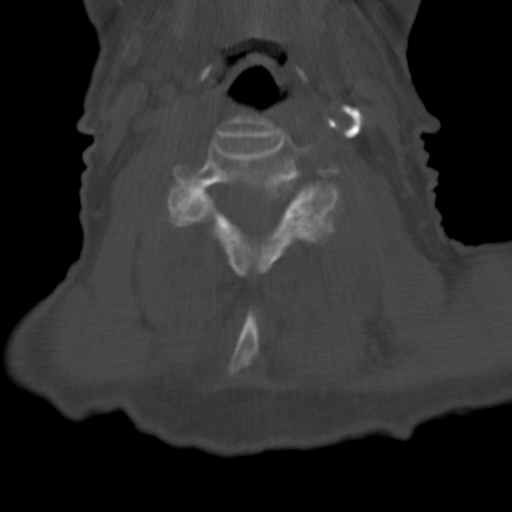
[im 36/80  bone]
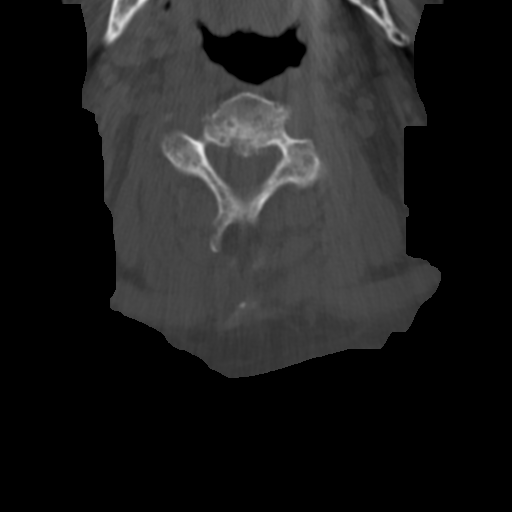
[im 44/80  soft-tissue]
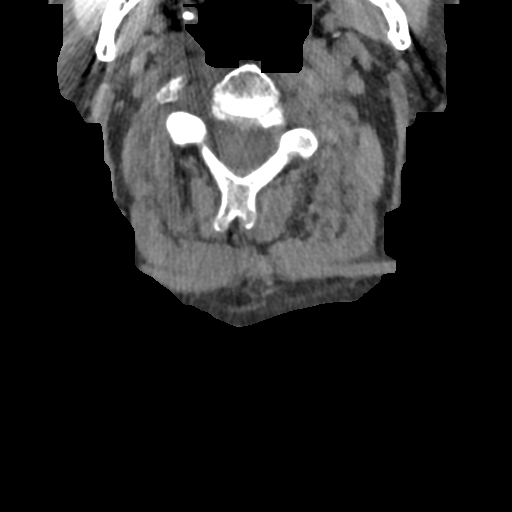
[im 44/80  bone]
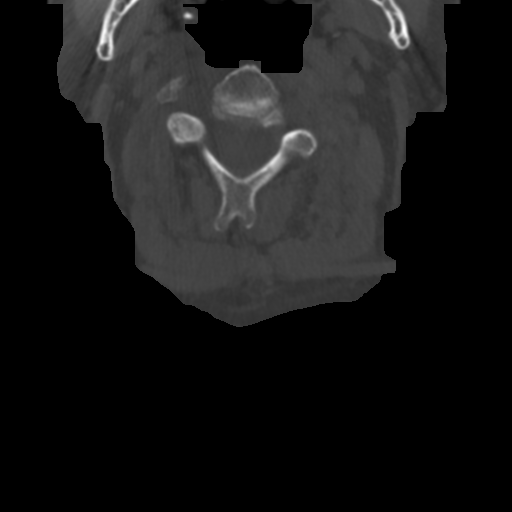
[im 53/80  bone]
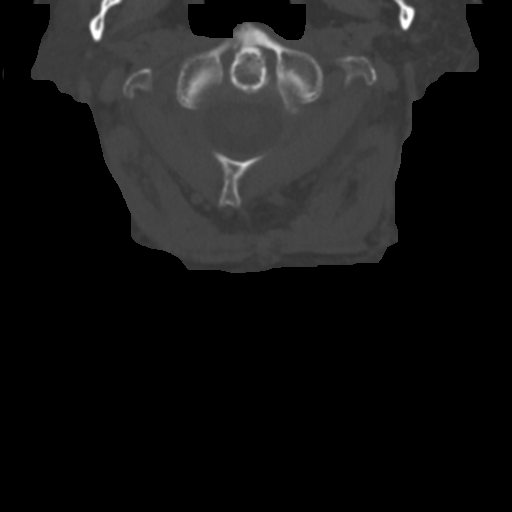
[im 62/80  bone]
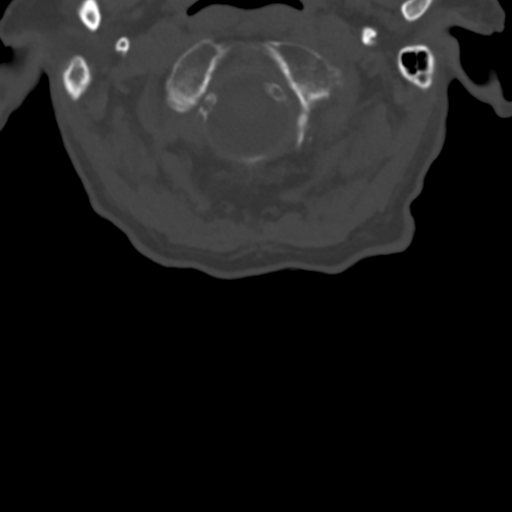
[im 71/80  bone]
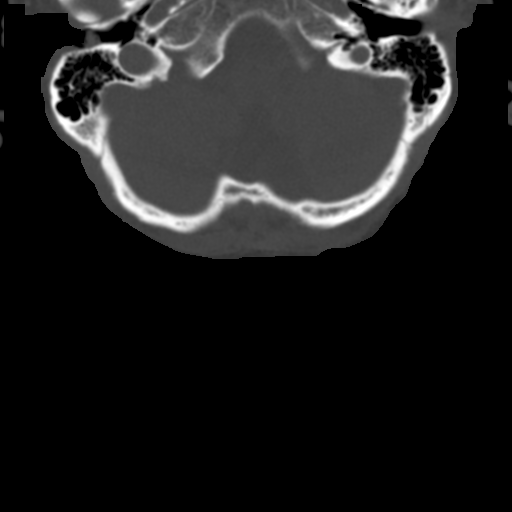

[Series 9: sagittal bone · sagittal · 0.31mm/px · 4 of 55 slices shown]
[im 11/55  bone]
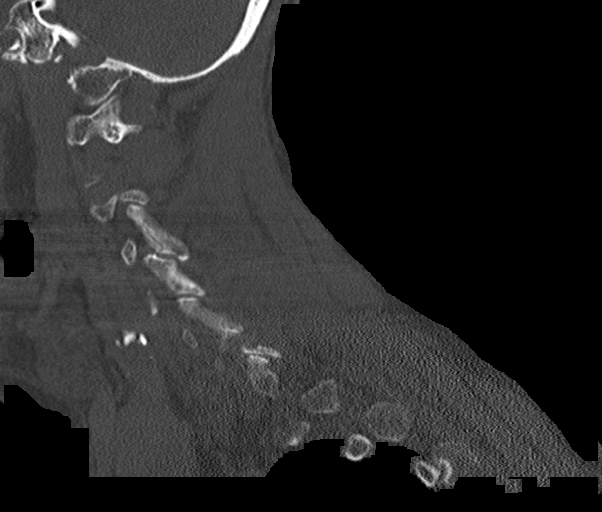
[im 22/55  bone]
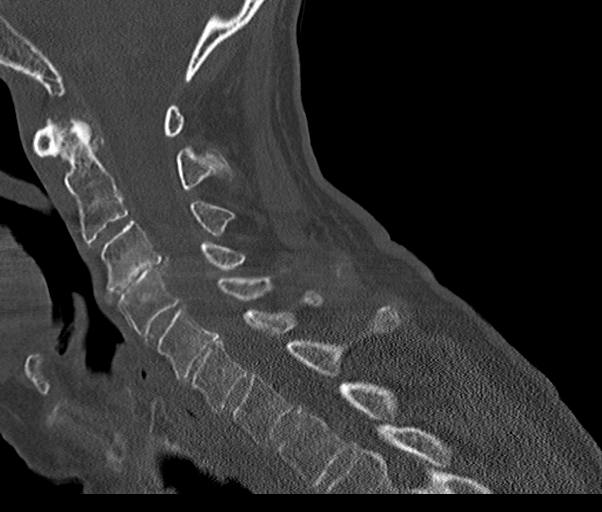
[im 33/55  bone]
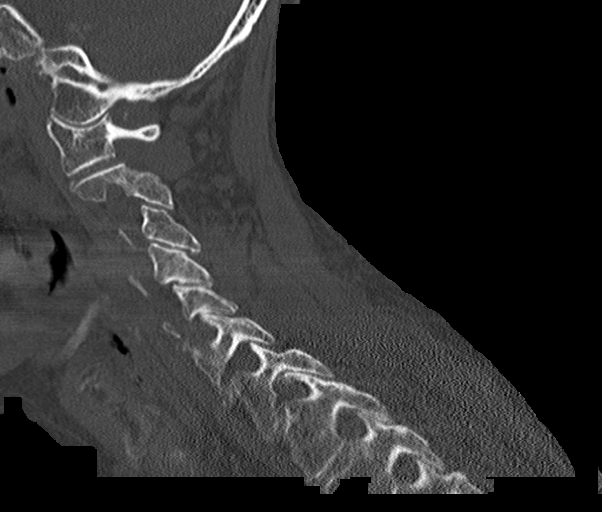
[im 44/55  bone]
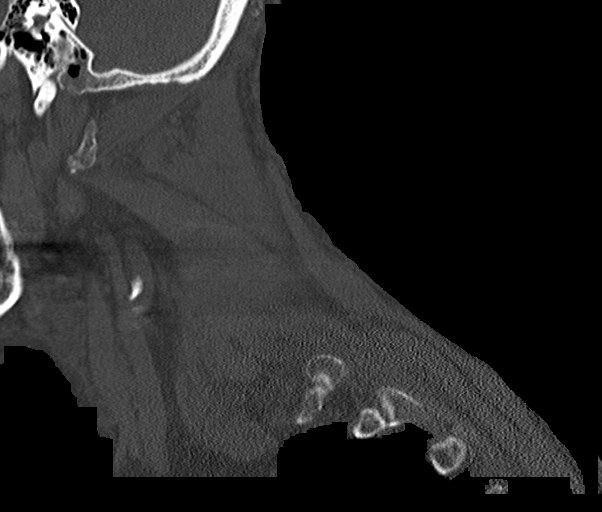

[Series 10: coronal bone · coronal · 0.23mm/px · 1 of 52 slices shown]
[im 26/52  bone]
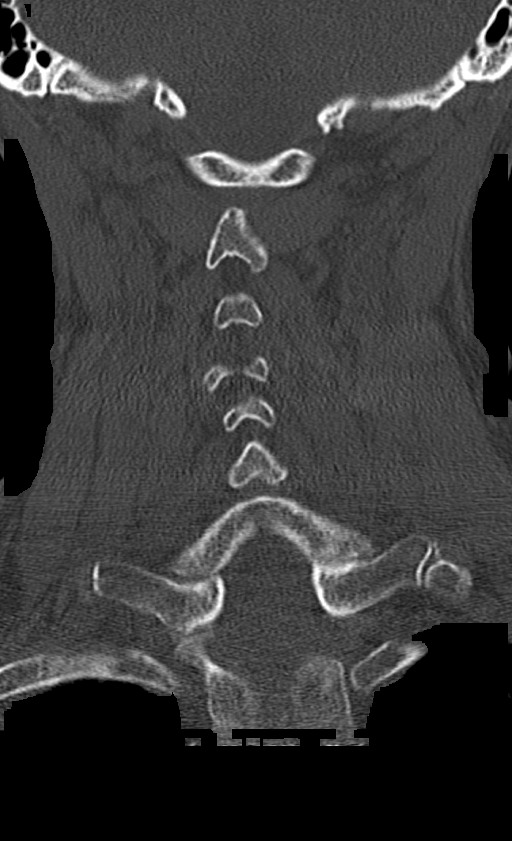

[15 of 33 positions shown; findings below may reference images not displayed]

FINDINGS: CT HEAD FINDINGS

Brain: No evidence of acute infarction, hemorrhage, hydrocephalus,
extra-axial collection or mass lesion/mass effect.

Global cortical atrophy.

Subcortical white matter and periventricular small vessel ischemic
changes.

Vascular: Intracranial atherosclerosis.

Skull: Normal. Negative for fracture or focal lesion.

Sinuses/Orbits: The visualized paranasal sinuses are essentially
clear. The mastoid air cells are unopacified.

Other: None.

CT CERVICAL SPINE FINDINGS

Alignment: Normal cervical lordosis.

Skull base and vertebrae: No acute fracture. No primary bone lesion
or focal pathologic process.

Soft tissues and spinal canal: No prevertebral fluid or swelling. No
visible canal hematoma.

Disc levels:  Mild degenerative changes, most prominent at C3-4.

Spinal canal is patent.

Upper chest: Visualized lung apices are clear.

Other: Visualized thyroid is unremarkable.
IMPRESSION: No evidence of acute intracranial abnormality. Atrophy with small
vessel ischemic changes.

No evidence of traumatic injury to the cervical spine. Mild
degenerative changes.
